# Patient Record
Sex: Male | Born: 1935
Health system: Southern US, Community
[De-identification: ages and names within clinical notes are randomized; demographics above are authoritative.]

## PROBLEM LIST (undated history)

## (undated) DIAGNOSIS — H9190 Unspecified hearing loss, unspecified ear: Secondary | ICD-10-CM

## (undated) DIAGNOSIS — R112 Nausea with vomiting, unspecified: Secondary | ICD-10-CM

## (undated) DIAGNOSIS — N200 Calculus of kidney: Secondary | ICD-10-CM

## (undated) DIAGNOSIS — T8859XA Other complications of anesthesia, initial encounter: Secondary | ICD-10-CM

## (undated) DIAGNOSIS — N184 Chronic kidney disease, stage 4 (severe): Secondary | ICD-10-CM

## (undated) DIAGNOSIS — N186 End stage renal disease: Secondary | ICD-10-CM

## (undated) DIAGNOSIS — T4145XA Adverse effect of unspecified anesthetic, initial encounter: Secondary | ICD-10-CM

## (undated) DIAGNOSIS — Z9889 Other specified postprocedural states: Secondary | ICD-10-CM

## (undated) DIAGNOSIS — I10 Essential (primary) hypertension: Secondary | ICD-10-CM

## (undated) DIAGNOSIS — M1990 Unspecified osteoarthritis, unspecified site: Secondary | ICD-10-CM

## (undated) HISTORY — PX: COLONOSCOPY: SHX174

## (undated) HISTORY — PX: EYE SURGERY: SHX253

## (undated) HISTORY — PX: SHOULDER SURGERY: SHX246

## (undated) HISTORY — PX: BACK SURGERY: SHX140

## (undated) HISTORY — PX: ESOPHAGOGASTRODUODENOSCOPY: SHX1529

## (undated) SURGERY — Surgical Case
Anesthesia: *Unknown

---

## 1998-08-03 ENCOUNTER — Ambulatory Visit (HOSPITAL_COMMUNITY): Admission: EM | Admit: 1998-08-03 | Discharge: 1998-08-03 | Payer: Self-pay | Admitting: Emergency Medicine

## 2003-05-04 ENCOUNTER — Encounter: Payer: Self-pay | Admitting: Gastroenterology

## 2003-05-04 ENCOUNTER — Ambulatory Visit (HOSPITAL_COMMUNITY): Admission: RE | Admit: 2003-05-04 | Discharge: 2003-05-04 | Payer: Self-pay | Admitting: Gastroenterology

## 2004-06-25 ENCOUNTER — Observation Stay (HOSPITAL_COMMUNITY): Admission: EM | Admit: 2004-06-25 | Discharge: 2004-06-26 | Payer: Self-pay | Admitting: Emergency Medicine

## 2004-06-26 ENCOUNTER — Encounter (INDEPENDENT_AMBULATORY_CARE_PROVIDER_SITE_OTHER): Payer: Self-pay | Admitting: Specialist

## 2009-08-19 ENCOUNTER — Ambulatory Visit: Payer: Self-pay | Admitting: Gastroenterology

## 2009-08-30 ENCOUNTER — Encounter: Payer: Self-pay | Admitting: Gastroenterology

## 2009-08-30 ENCOUNTER — Ambulatory Visit: Payer: Self-pay | Admitting: Gastroenterology

## 2009-09-01 ENCOUNTER — Encounter: Payer: Self-pay | Admitting: Gastroenterology

## 2010-05-08 ENCOUNTER — Encounter: Admission: RE | Admit: 2010-05-08 | Discharge: 2010-05-08 | Payer: Self-pay | Admitting: Orthopedic Surgery

## 2010-05-17 ENCOUNTER — Inpatient Hospital Stay (HOSPITAL_COMMUNITY): Admission: RE | Admit: 2010-05-17 | Discharge: 2010-05-19 | Payer: Self-pay | Admitting: Orthopedic Surgery

## 2011-01-28 LAB — COMPREHENSIVE METABOLIC PANEL
ALT: 27 U/L (ref 0–53)
AST: 22 U/L (ref 0–37)
Albumin: 3.7 g/dL (ref 3.5–5.2)
Alkaline Phosphatase: 99 U/L (ref 39–117)
CO2: 28 mEq/L (ref 19–32)
Calcium: 9.2 mg/dL (ref 8.4–10.5)
Chloride: 101 mEq/L (ref 96–112)
Creatinine, Ser: 1.63 mg/dL — ABNORMAL HIGH (ref 0.4–1.5)
GFR calc non Af Amer: 41 mL/min — ABNORMAL LOW (ref >60)
Glucose, Bld: 98 mg/dL (ref 70–99)
Sodium: 135 mEq/L (ref 135–145)
Total Protein: 6.5 g/dL (ref 6.0–8.3)

## 2011-01-28 LAB — CBC
MCH: 29.6 pg (ref 26.0–34.0)
MCHC: 33.9 g/dL (ref 30.0–36.0)
RBC: 5.58 MIL/uL (ref 4.22–5.81)
WBC: 11.3 10*3/uL — ABNORMAL HIGH (ref 4.0–10.5)

## 2011-01-28 LAB — URINALYSIS, DIPSTICK ONLY
Hgb urine dipstick: NEGATIVE
Leukocytes, UA: NEGATIVE
Nitrite: NEGATIVE
Protein, ur: 100 mg/dL — AB
Urobilinogen, UA: 0.2 mg/dL (ref 0.0–1.0)

## 2011-01-28 LAB — DIFFERENTIAL
Basophils Relative: 0 % (ref 0–1)
Eosinophils Absolute: 0.1 10*3/uL (ref 0.0–0.7)
Monocytes Absolute: 0.9 10*3/uL (ref 0.1–1.0)
Neutro Abs: 9.1 10*3/uL — ABNORMAL HIGH (ref 1.7–7.7)

## 2011-01-28 LAB — PROTIME-INR: Prothrombin Time: 13 seconds (ref 11.6–15.2)

## 2011-03-30 NOTE — Consult Note (Signed)
Murillo, Shaun                           ACCOUNT NO.:  [id number]   MEDICAL RECORD NO.:  [id number]                   PATIENT TYPE:  OBV   LOCATION:  0106                                 FACILITY:  [facility]   PHYSICIAN:  Satira Sark, M.D. [facility]        DATE OF BIRTH:  06/28/1932   DATE OF CONSULTATION:  DATE OF DISCHARGE:                                   CONSULTATION   REFERRING PHYSICIAN:  Benito Mccreedy, M.D.   PRIMARY CARE PHYSICIAN:  Lorelee Market, M.D.   CARDIOLOGIST:  Kirk Ruths, M.D.   REASON FOR CONSULTATION:  Chest discomfort.   HISTORY OF PRESENT ILLNESS:  Shaun Murillo is a pleasant 77 year old male with a  history of hypertension and gastrointestinal reflux disease with esophageal  stricture, status post dilation, most recently at the beginning of this  year.  He was evaluated by Dr. Stanford Breed in our office back in April with  some complaints of dyspnea on exertion and at that time was referred for an  adenosine Cardiolite which was read as a probable normal study with no  electrocardiographic changes and some apical thinning, but no clear  ischemia, an ejection fraction calculated at 48%, although visually  appearing to be better.  He presents to the [facility] Emergency Department  now complaining of bad indigestion and describing a burning sensation in  his mid sternal area.  He says that over the last 10 days he has had  dysphagia and actually some emesis after eating due the sensation of early  satiety.  He has gradually developed a soreness in the mid chest area that  is exacerbated on swallowing both liquids and solids.  This became more  intense Thursday morning and has progressed since that time, prompting his  evaluation in the emergency department.  At present, he is symptom-free.  His electrocardiogram today shows sinus rhythm with no acute ST-T wave  changes and his initial point of care cardiac markers are negative.  He is  being admitted  for further observation and we have been asked to evaluate  him further.   ALLERGIES:  CODEINE.   MEDICATIONS:  1. Micardis 80 mg p.o. daily.  2. Indocin p.r.n.  3. Allopurinol p.r.n.   PAST MEDICAL HISTORY:  1. Hypertension.  2. Gouty arthritis.  3. Gastrointestinal reflux disease with esophageal stricture, status post     dilatation.  4. History of erectile dysfunction.   No documented history of coronary artery disease, myocardial infarction,  type 2 diabetes mellitus or dyslipidemia.   SOCIAL HISTORY:  The patient lives in [place], [place], with his  wife.  He quit tobacco use 10 years ago and denies any significant alcohol  use.  He is a retired International aid/development worker.   FAMILY HISTORY:  Significant for cardiovascular disease.  The patient's  father died at age 18 with a myocardial infarction.   REVIEW OF SYSTEMS:  As described in the history  of present illness.  He has  headaches occasionally.  He denies any hematemesis, melena and hematochezia.   PHYSICAL EXAMINATION:  VITAL SIGNS:  His temperature is 97.8 degrees, his  heart rate is 83, respirations 20, blood pressure 150/87 and oxygen  saturation 95% on room air.  GENERAL APPEARANCE:  This is a well-nourished male in no acute distress.  NECK:  No elevated jugular venous pressure or carotid bruits.  No  thyromegaly is noted.  LUNGS:  Clear to auscultation without labored breathing at rest.  CARDIAC:  Regular rate and rhythm without loud murmur or S3 gallop.  There  is no pericardial rub noted.  ABDOMEN:  Soft with normoactive bowel sounds.  There is perhaps some mild  epigastric tenderness.  No guarding.  EXTREMITIES:  No pitting edema.  Peripheral pulses are 2+.  SKIN:  No ulcerative changes are noted.  MUSCULOSKELETAL:  No kyphosis is noted.   LABORATORY DATA:  The chest x-ray is reported as showing no active disease.   WBC 5.6, hemoglobin 15.4, hematocrit 46.1, platelets 216.  Sodium 138,  potassium  4.0, BUN 23, creatinine 1.5, glucose 97.  Initial point of care  troponin I levels are negative.   IMPRESSION:  1. Chest pain syndrome, atypical for ischemia and more suggestive of a     gastrointestinal etiology, particularly given the patient's symptoms     associated with dysphagia and his history of reflux disease and     esophageal stricture, status post dilatation.  His initial     electrocardiogram is normal and his point of care cardiac enzymes are     negative so far.  2. Low-risk Cardiolite in April of this year as described.  The report is     placed in the chart for review.  3. Hypertension.  4. Family history of cardiovascular disease.  5. Remote tobacco use history.  6. History of gouty arthritis with p.r.n. nonsteroidal anti-inflammatory     drug use.   RECOMMENDATIONS:  1. Would cycle cardiac markers.  Do not anticipate any additional cardiac     testing at this point unless markers are abnormal.  Would suggest a     formal gastrointestinal evaluation as a first step.  2. If symptoms progress and gastrointestinal evaluation is unremarkable, can     consider additional cardiac testing.  Would otherwise continue risk     factor modification.  Will follow.                                               Satira Sark, M.D. [facility]    SGM/MEDQ  D:  06/25/2004  T:  06/25/2004  Job:  [id number]

## 2014-04-09 ENCOUNTER — Emergency Department (HOSPITAL_COMMUNITY)
Admission: EM | Admit: 2014-04-09 | Discharge: 2014-04-09 | Disposition: A | Discharge status: Home / Self Care | Payer: Medicare Other | Attending: Emergency Medicine | Admitting: Emergency Medicine

## 2014-04-09 ENCOUNTER — Encounter (HOSPITAL_COMMUNITY): Payer: Self-pay | Admitting: Emergency Medicine

## 2014-04-09 ENCOUNTER — Emergency Department (HOSPITAL_COMMUNITY): Payer: Medicare Other

## 2014-04-09 DIAGNOSIS — S61409A Unspecified open wound of unspecified hand, initial encounter: Secondary | ICD-10-CM | POA: Insufficient documentation

## 2014-04-09 DIAGNOSIS — W010XXA Fall on same level from slipping, tripping and stumbling without subsequent striking against object, initial encounter: Secondary | ICD-10-CM | POA: Insufficient documentation

## 2014-04-09 DIAGNOSIS — I10 Essential (primary) hypertension: Secondary | ICD-10-CM | POA: Insufficient documentation

## 2014-04-09 DIAGNOSIS — Y929 Unspecified place or not applicable: Secondary | ICD-10-CM | POA: Insufficient documentation

## 2014-04-09 DIAGNOSIS — Z880 Allergy status to penicillin: Secondary | ICD-10-CM | POA: Insufficient documentation

## 2014-04-09 DIAGNOSIS — S61411A Laceration without foreign body of right hand, initial encounter: Secondary | ICD-10-CM

## 2014-04-09 DIAGNOSIS — Y939 Activity, unspecified: Secondary | ICD-10-CM | POA: Insufficient documentation

## 2014-04-09 HISTORY — DX: Essential (primary) hypertension: I10

## 2014-04-09 MED ORDER — CLINDAMYCIN HCL 150 MG PO CAPS: 150.0000 mg | ORAL_CAPSULE | Freq: Four times a day (QID) | ORAL | Status: DC

## 2014-04-09 NOTE — ED Notes (Addendum)
Presents with hand laceration from a fall after stepping in [employer] tripping. small abrasion to chin and knee. Pt only c/o right hand laceration. CMS intact. Hand is wrapped up by fire department. No blood thinner use.

## 2014-04-09 NOTE — ED Provider Notes (Signed)
CSN: [id number]     Arrival date & time 04/09/14  1558 History  This chart was scribed for non-physician practitioner Etta Quill, NP working with Ezequiel Essex, MD by Anastasia Pall, ED scribe. This patient was seen in room TR09C/TR09C and the patient's care was started at 6:15 PM.   Chief Complaint  Patient presents with  . Extremity Laceration   (Consider location/radiation/quality/duration/timing/severity/associated sxs/prior Treatment) Patient is a 80 y.o. male presenting with skin laceration. The history is provided by the patient. No language interpreter was used.  Laceration Location:  Hand Hand laceration location:  R palm Bleeding: controlled   Time since incident: earlier this evening. Laceration mechanism:  Fall Foreign body present:  No foreign bodies Tetanus status:  Up to date  HPI Comments: EDWARDS MINDEL is a 80 y.o. male who presents to the Emergency Department complaining of a deep laceration across his right palm, onset earlier this evening when he stepped in a hole, tripped, and landed forward on his hands, knees, and chin. Bleeding controlled by bandage done by Fire Department. He reports mild abrasions to his knees and chin, but denies back pain, extremity pain, and any other associated symptoms. Wife states his tetanus is UTD.   PCP Donnie Coffin, MD  Past Medical History  Diagnosis Date  . Hypertension    History reviewed. No pertinent past surgical history. History reviewed. No pertinent family history. History  Substance Use Topics  . Smoking status: Never Smoker   . Smokeless tobacco: Not on file  . Alcohol Use: No    Review of Systems  Musculoskeletal: Negative for arthralgias, back pain, joint swelling and myalgias.  Skin: Positive for wound (laceration across right palm).  Neurological: Negative for weakness and numbness.  All other systems reviewed and are negative.  Allergies  Codeine and Penicillins  Home Medications   Prior to  Admission medications   Not on File   BP 157/85  Pulse 84  Temp(Src) 97.6 F (36.4 C) (Oral)  Resp 18  SpO2 95% Physical Exam  Nursing note and vitals reviewed. Constitutional: He is oriented to person, place, and time. He appears well-developed and well-nourished. No distress.  HENT:  Head: Normocephalic and atraumatic.  Eyes: EOM are normal.  Neck: Neck supple. No tracheal deviation present.  Cardiovascular: Normal rate.   Pulmonary/Chest: Effort normal. No respiratory distress.  Musculoskeletal: Normal range of motion.  10 cm laceration across right palm.   Neurological: He is alert and oriented to person, place, and time.  Skin: Skin is warm and dry.  Psychiatric: He has a normal mood and affect. His behavior is normal.   ED Course  Procedures (including critical care time) DIAGNOSTIC STUDIES: Oxygen Saturation is 95% on room air, adequate by my interpretation.    COORDINATION OF CARE: 6:20 PM-Discussed treatment plan which includes sutures with pt at bedside and pt agreed to plan.   LACERATION REPAIR PROCEDURE NOTE The patient's identification was confirmed and consent was obtained. This procedure was performed by Etta Quill, NP at 7:10 PM. Site: Right palm Sterile procedures observed: Saline Anesthetic used (type and amt): 6cc 2% Xylocaine with Epinephrine Suture type/size: 4.0 Prolene Length:10 cm # of Sutures: 12 Technique: Simple interrupted, loosely tied  Tetanus UTD  LACERATION REPAIR PROCEDURE NOTE The patient's identification was confirmed and consent was obtained. This procedure was performed by Etta Quill, NP at 7:10 PM. Site: Right pointer finger Sterile procedures observed: Saline Anesthetic used (type and amt): 1 cc 2% Xylocaine with Epinephrine Suture  type/size: 4.0 Prolene Length:2.5 cm # of Sutures: 4 Technique:Simple interrupted, loosely tied.  Tetanus UTD  Labs Review Labs Reviewed - No data to display  Imaging Review Dg Hand  Complete Right  04/09/2014   CLINICAL DATA:  Laceration across the palmar surface of the hand.  EXAM: RIGHT HAND - COMPLETE 3+ VIEW  COMPARISON:  None.  FINDINGS: Positioning is limited by overlying bandages. The fingers are partially overlapped. There is some soft tissue swelling in the hand. No definite foreign body or soft tissue emphysema is seen. There is no evidence of acute fracture or dislocation. Mild interphalangeal degenerative changes are noted.  IMPRESSION: No acute osseous findings or foreign bodies demonstrated. Evaluation is limited by overlying bandages and positioning.   Electronically Signed   By: Camie Patience M.D.   On: 04/09/2014 18:01    EKG Interpretation None     Medications - No data to display  Patient discussed with and seen by Dr. Wyvonnia Dusky. MDM   Final diagnoses:  Laceration of right hand   Superficial abrasion to chin and right knee. Hand laceration.  Discussed with Dr. Burney Gauze, who will see patient in the office on Tuesday.  Per his recommendation, wounds cleaned and loosely closed.  I personally performed the services described in this documentation, which was scribed in my presence. The recorded information has been reviewed and is accurate.    Norman Herrlich, NP 04/10/14 [phone]

## 2014-04-09 NOTE — ED Notes (Signed)
NP done suturing.

## 2014-04-09 NOTE — ED Notes (Signed)
NP at bedside to suture

## 2014-04-09 NOTE — Discharge Instructions (Signed)

## 2014-04-10 NOTE — ED Provider Notes (Signed)
Medical screening examination/treatment/procedure(s) were conducted as a shared visit with non-physician practitioner(s) and myself.  I personally evaluated the patient during the encounter.  Patient with laceration to right palm and index finger after mechanical fall. No loss of consciousness. Abrasions to knee and shin. No anticoagulant use.  Large laceration across right palm at level of MCPs. No tendon visible. Patient able to flex second, third, fourth, fifth digits at MCP joint and extend fully. Second laceration across the PIP joint of right index finger. Tendon visible. Flexion intact at MCP, FDS, FDP extension intact. Sensation and capillary refill intact  No evidence of vascular or nerve injury. Cannot completely rule out tendon injury. NP Tamala Julian d/w Dr. Burney Gauze who agrees with loose closure   EKG Interpretation None        Ezequiel Essex, MD 04/10/14 [phone]

## 2014-07-01 ENCOUNTER — Encounter: Payer: Self-pay | Admitting: Gastroenterology

## 2015-04-21 ENCOUNTER — Encounter: Payer: Self-pay | Admitting: Gastroenterology

## 2015-11-28 ENCOUNTER — Other Ambulatory Visit: Payer: Self-pay | Admitting: Family Medicine

## 2015-11-28 ENCOUNTER — Ambulatory Visit
Admission: RE | Admit: 2015-11-28 | Discharge: 2015-11-28 | Disposition: A | Discharge status: Home / Self Care | Payer: Self-pay | Source: Ambulatory Visit | Attending: Family Medicine | Admitting: Family Medicine

## 2015-11-28 DIAGNOSIS — M79645 Pain in left finger(s): Secondary | ICD-10-CM

## 2016-06-01 ENCOUNTER — Other Ambulatory Visit: Payer: Self-pay | Admitting: Otolaryngology

## 2016-06-01 DIAGNOSIS — R221 Localized swelling, mass and lump, neck: Secondary | ICD-10-CM

## 2016-06-08 ENCOUNTER — Ambulatory Visit
Admission: RE | Admit: 2016-06-08 | Discharge: 2016-06-08 | Disposition: A | Discharge status: Home / Self Care | Payer: Medicare Other | Source: Ambulatory Visit | Attending: Otolaryngology | Admitting: Otolaryngology

## 2016-06-08 DIAGNOSIS — R221 Localized swelling, mass and lump, neck: Secondary | ICD-10-CM

## 2016-06-28 ENCOUNTER — Other Ambulatory Visit: Payer: Self-pay | Admitting: Otolaryngology

## 2016-07-04 ENCOUNTER — Other Ambulatory Visit: Payer: Self-pay | Admitting: Otolaryngology

## 2016-07-09 NOTE — Pre-Procedure Instructions (Signed)
Shaun Murillo  07/09/2016      [facility] - [place], [place] - [id number] [place] [address] Phone: [phone] Fax: [phone]    Your procedure is scheduled on September 6  Report to [facility] Admitting at 0800 A.M.  Call this number if you have problems the morning of surgery:  [phone]   Remember:  Do not eat food or drink liquids after midnight.   Take these medicines the morning of surgery with A SIP OF WATER Amlodipine (norvasc), omeprazole (prilosec)  7 days prior to surgery STOP taking any Aspirin, Aleve, Naproxen, Ibuprofen, Motrin, Advil, Goody's, BC's, all herbal medications, fish oil, and all vitamins    Do not wear jewelry.  Do not wear lotions, powders, or cologne, or deoderant.  Men may shave face and neck.  Do not bring valuables to the hospital.  [facility] is not responsible for any belongings or valuables.  Contacts, dentures or bridgework may not be worn into surgery.  Leave your suitcase in the car.  After surgery it may be brought to your room.  For patients admitted to the hospital, discharge time will be determined by your treatment team.  Patients discharged the day of surgery will not be allowed to drive home.    Special instructions:   [place]- Preparing For Surgery  Before surgery, you can play an important role. Because skin is not sterile, your skin needs to be as free of germs as possible. You can reduce the number of germs on your skin by washing with CHG (chlorahexidine gluconate) Soap before surgery.  CHG is an antiseptic cleaner which kills germs and bonds with the skin to continue killing germs even after washing.  Please do not use if you have an allergy to CHG or antibacterial soaps. If your skin becomes reddened/irritated stop using the CHG.  Do not shave (including legs and underarms) for at least 48 hours prior to first CHG shower. It is OK to shave your  face.  Please follow these instructions carefully.   1. Shower the NIGHT BEFORE SURGERY and the MORNING OF SURGERY with CHG.   2. If you chose to wash your hair, wash your hair first as usual with your normal shampoo.  3. After you shampoo, rinse your hair and body thoroughly to remove the shampoo.  4. Use CHG as you would any other liquid soap. You can apply CHG directly to the skin and wash gently with a scrungie or a clean washcloth.   5. Apply the CHG Soap to your body ONLY FROM THE NECK DOWN.  Do not use on open wounds or open sores. Avoid contact with your eyes, ears, mouth and genitals (private parts). Wash genitals (private parts) with your normal soap.  6. Wash thoroughly, paying special attention to the area where your surgery will be performed.  7. Thoroughly rinse your body with warm water from the neck down.  8. DO NOT shower/wash with your normal soap after using and rinsing off the CHG Soap.  9. Pat yourself dry with a CLEAN TOWEL.   10. Wear CLEAN PAJAMAS   11. Place CLEAN SHEETS on your bed the night of your first shower and DO NOT SLEEP WITH PETS.    Day of Surgery: Do not apply any deodorants/lotions. Please wear clean clothes to the hospital/surgery center.      Please read over the following fact sheets that you  were given. Pain Booklet, Coughing and Deep Breathing and Surgical Site Infection Prevention

## 2016-07-10 ENCOUNTER — Encounter (HOSPITAL_COMMUNITY)
Admission: RE | Admit: 2016-07-10 | Discharge: 2016-07-10 | Disposition: A | Discharge status: Home / Self Care | Payer: Medicare Other | Source: Ambulatory Visit | Attending: Otolaryngology | Admitting: Otolaryngology

## 2016-07-10 ENCOUNTER — Encounter (HOSPITAL_COMMUNITY): Payer: Self-pay

## 2016-07-10 DIAGNOSIS — Z01812 Encounter for preprocedural laboratory examination: Secondary | ICD-10-CM | POA: Diagnosis not present

## 2016-07-10 DIAGNOSIS — I10 Essential (primary) hypertension: Secondary | ICD-10-CM | POA: Diagnosis not present

## 2016-07-10 HISTORY — DX: Adverse effect of unspecified anesthetic, initial encounter: T41.45XA

## 2016-07-10 HISTORY — DX: Other complications of anesthesia, initial encounter: T88.59XA

## 2016-07-10 HISTORY — DX: Chronic kidney disease, stage 4 (severe): N18.4

## 2016-07-10 HISTORY — DX: Nausea with vomiting, unspecified: Z98.890

## 2016-07-10 HISTORY — DX: Unspecified hearing loss, unspecified ear: H91.90

## 2016-07-10 HISTORY — DX: Unspecified osteoarthritis, unspecified site: M19.90

## 2016-07-10 HISTORY — DX: Nausea with vomiting, unspecified: R11.2

## 2016-07-10 HISTORY — DX: Calculus of kidney: N20.0

## 2016-07-10 LAB — BASIC METABOLIC PANEL
Anion gap: 9 (ref 5–15)
BUN: 24 mg/dL — AB (ref 6–20)
CALCIUM: 9.5 mg/dL (ref 8.9–10.3)
CO2: 22 mmol/L (ref 22–32)
CREATININE: 2.42 mg/dL — AB (ref 0.61–1.24)
Chloride: 110 mmol/L (ref 101–111)
GFR calc Af Amer: 27 mL/min — ABNORMAL LOW (ref >60)
GFR, EST NON AFRICAN AMERICAN: 23 mL/min — AB (ref >60)
GLUCOSE: 98 mg/dL (ref 65–99)
Potassium: 4.2 mmol/L (ref 3.5–5.1)
SODIUM: 141 mmol/L (ref 135–145)

## 2016-07-10 LAB — CBC
HEMATOCRIT: 47.2 % (ref 39.0–52.0)
Hemoglobin: 15.4 g/dL (ref 13.0–17.0)
MCH: 29.4 pg (ref 26.0–34.0)
MCHC: 32.6 g/dL (ref 30.0–36.0)
MCV: 90.2 fL (ref 78.0–100.0)
PLATELETS: 210 10*3/uL (ref 150–400)
RBC: 5.23 MIL/uL (ref 4.22–5.81)
RDW: 14.2 % (ref 11.5–15.5)
WBC: 5 10*3/uL (ref 4.0–10.5)

## 2016-07-10 NOTE — Progress Notes (Signed)
PCP - L. Donnie Coffin  Cardiologist - denies  Chest x-ray - not needed EKG - 07/10/16 Stress Test - denies ECHO -denies  Cardiac Cath -denies     Patient denies shortness of breath, fever, cough and chest pain at PAT appointment

## 2016-07-11 NOTE — Progress Notes (Addendum)
Anesthesia Chart Review:  Pt is an 82 year old male scheduled for R parotidectomy with selective neck dissection, radical neck dissection on 07/18/2016 with Melida Quitter, MD.   PCP is Donnie Coffin, MD.   PMH includes:  HTN, CKD (stage 4), post-op N/V. Never smoker. BMI 26.5  Medications include: amlodipine, ASA, losartan, prilosec.   Preoperative labs reviewed.  Cr 2.42, BUN 24. This is consistent with results from PCP's office; pt's CKD is currently managed by PCP.   EKG 07/10/16: NSR.   If no changes, I anticipate pt can proceed with surgery as scheduled.   Willeen Cass, FNP-BC [facility]/Anesthesiology Phone: [phone] 07/12/2016 1:14 PM

## 2016-07-12 ENCOUNTER — Encounter (HOSPITAL_COMMUNITY): Payer: Self-pay

## 2016-07-17 NOTE — Anesthesia Preprocedure Evaluation (Addendum)
Anesthesia Evaluation  Patient identified by MRN, date of birth, ID band Patient awake    Reviewed: Allergy & Precautions, NPO status , Patient's Chart, lab work & pertinent test results  History of Anesthesia Complications (+) PONV and history of anesthetic complications  Airway Mallampati: II  TM Distance: >3 FB Neck ROM: Full    Dental  (+) Upper Dentures, Lower Dentures   Pulmonary neg shortness of breath, neg sleep apnea, neg COPD, neg recent URI,    Pulmonary exam normal breath sounds clear to auscultation       Cardiovascular Exercise Tolerance: Good hypertension, Pt. on medications (-) angina(-) Past MI, (-) Cardiac Stents and (-) Orthopnea  Rhythm:Regular Rate:Normal     Neuro/Psych neg Seizures negative neurological ROS     GI/Hepatic negative GI ROS, Neg liver ROS, neg GERD  ,  Endo/Other  negative endocrine ROSneg diabetes  Renal/GU CRFRenal disease (baseline Cr ~2.4)     Musculoskeletal  (+) Arthritis ,   Abdominal   Peds  Hematology negative hematology ROS (+)   Anesthesia Other Findings   Reproductive/Obstetrics                            Anesthesia Physical Anesthesia Plan  ASA: III  Anesthesia Plan: General   Post-op Pain Management:    Induction: Intravenous  Airway Management Planned: Oral ETT  Additional Equipment:   Intra-op Plan:   Post-operative Plan: Extubation in OR  Informed Consent: I have reviewed the patients History and Physical, chart, labs and discussed the procedure including the risks, benefits and alternatives for the proposed anesthesia with the patient or authorized representative who has indicated his/her understanding and acceptance.   Dental advisory given  Plan Discussed with: CRNA  Anesthesia Plan Comments: (Risks of general anesthesia discussed including, but not limited to, sore throat, hoarse voice, chipped/damaged teeth,  injury to vocal cords, nausea and vomiting, allergic reactions, lung infection, heart attack, stroke, and death. All questions answered. )       Anesthesia Quick Evaluation

## 2016-07-18 ENCOUNTER — Ambulatory Visit (HOSPITAL_COMMUNITY): Payer: Medicare Other | Admitting: Anesthesiology

## 2016-07-18 ENCOUNTER — Encounter (HOSPITAL_COMMUNITY)
Admission: RE | Disposition: A | Discharge status: Home / Self Care | Payer: Self-pay | Source: Ambulatory Visit | Attending: Otolaryngology

## 2016-07-18 ENCOUNTER — Ambulatory Visit (HOSPITAL_COMMUNITY): Payer: Medicare Other | Admitting: Emergency Medicine

## 2016-07-18 ENCOUNTER — Encounter (HOSPITAL_COMMUNITY): Payer: Self-pay | Admitting: Surgery

## 2016-07-18 ENCOUNTER — Observation Stay (HOSPITAL_COMMUNITY)
Admission: RE | Admit: 2016-07-18 | Discharge: 2016-07-19 | Disposition: A | Discharge status: Home / Self Care | Payer: Medicare Other | Source: Ambulatory Visit | Attending: Otolaryngology | Admitting: Otolaryngology

## 2016-07-18 DIAGNOSIS — Z880 Allergy status to penicillin: Secondary | ICD-10-CM | POA: Diagnosis not present

## 2016-07-18 DIAGNOSIS — D110 Benign neoplasm of parotid gland: Secondary | ICD-10-CM | POA: Diagnosis present

## 2016-07-18 DIAGNOSIS — N184 Chronic kidney disease, stage 4 (severe): Secondary | ICD-10-CM | POA: Diagnosis not present

## 2016-07-18 DIAGNOSIS — K219 Gastro-esophageal reflux disease without esophagitis: Secondary | ICD-10-CM | POA: Insufficient documentation

## 2016-07-18 DIAGNOSIS — K118 Other diseases of salivary glands: Secondary | ICD-10-CM | POA: Diagnosis present

## 2016-07-18 DIAGNOSIS — M1990 Unspecified osteoarthritis, unspecified site: Secondary | ICD-10-CM | POA: Diagnosis not present

## 2016-07-18 DIAGNOSIS — I129 Hypertensive chronic kidney disease with stage 1 through stage 4 chronic kidney disease, or unspecified chronic kidney disease: Secondary | ICD-10-CM | POA: Diagnosis not present

## 2016-07-18 DIAGNOSIS — Z7982 Long term (current) use of aspirin: Secondary | ICD-10-CM | POA: Insufficient documentation

## 2016-07-18 HISTORY — PX: PAROTIDECTOMY: SHX2163

## 2016-07-18 SURGERY — EXCISION, PAROTID GLAND
Anesthesia: General | Site: Neck | Laterality: Right

## 2016-07-18 MED ORDER — LIDOCAINE 2% (20 MG/ML) 5 ML SYRINGE
INTRAMUSCULAR | Status: AC
  Filled 2016-07-18: qty 10

## 2016-07-18 MED ORDER — FENTANYL CITRATE (PF) 100 MCG/2ML IJ SOLN
25.0000 ug | INTRAMUSCULAR | Status: DC | PRN
  Administered 2016-07-18: 25 ug via INTRAVENOUS
  Administered 2016-07-18 (×2): 50 ug via INTRAVENOUS

## 2016-07-18 MED ORDER — SUCCINYLCHOLINE 20MG/ML (10ML) SYRINGE FOR MEDFUSION PUMP - OPTIME
INTRAMUSCULAR | Status: DC | PRN
  Administered 2016-07-18: 80 mg via INTRAVENOUS

## 2016-07-18 MED ORDER — FENTANYL CITRATE (PF) 100 MCG/2ML IJ SOLN
INTRAMUSCULAR | Status: DC | PRN
  Administered 2016-07-18 (×2): 50 ug via INTRAVENOUS

## 2016-07-18 MED ORDER — BACITRACIN ZINC 500 UNIT/GM EX OINT
1.0000 "application " | TOPICAL_OINTMENT | Freq: Three times a day (TID) | CUTANEOUS | Status: DC
  Administered 2016-07-18 – 2016-07-19 (×3): 1 via TOPICAL

## 2016-07-18 MED ORDER — HYDROCODONE-ACETAMINOPHEN 5-325 MG PO TABS
1.0000 | ORAL_TABLET | ORAL | Status: DC | PRN
  Administered 2016-07-18 – 2016-07-19 (×3): 2 via ORAL
  Filled 2016-07-18 (×3): qty 2

## 2016-07-18 MED ORDER — PROMETHAZINE HCL 25 MG/ML IJ SOLN
INTRAMUSCULAR | Status: AC
  Administered 2016-07-18: 6.25 mg via INTRAVENOUS
  Filled 2016-07-18: qty 1

## 2016-07-18 MED ORDER — ONDANSETRON HCL 4 MG/2ML IJ SOLN
INTRAMUSCULAR | Status: AC
  Filled 2016-07-18: qty 4

## 2016-07-18 MED ORDER — SUCCINYLCHOLINE CHLORIDE 200 MG/10ML IV SOSY
PREFILLED_SYRINGE | INTRAVENOUS | Status: AC
  Filled 2016-07-18: qty 10

## 2016-07-18 MED ORDER — CHLORHEXIDINE GLUCONATE CLOTH 2 % EX PADS: 6.0000 | MEDICATED_PAD | Freq: Once | CUTANEOUS | Status: DC

## 2016-07-18 MED ORDER — ONDANSETRON HCL 4 MG/2ML IJ SOLN
4.0000 mg | Freq: Once | INTRAMUSCULAR | Status: AC | PRN
  Administered 2016-07-18: 4 mg via INTRAVENOUS

## 2016-07-18 MED ORDER — EPHEDRINE SULFATE 50 MG/ML IJ SOLN
INTRAMUSCULAR | Status: DC | PRN
  Administered 2016-07-18: 10 mg via INTRAVENOUS
  Administered 2016-07-18 (×2): 5 mg via INTRAVENOUS

## 2016-07-18 MED ORDER — CEFAZOLIN IN D5W 1 GM/50ML IV SOLN
INTRAVENOUS | Status: DC | PRN
  Administered 2016-07-18: 2 g via INTRAVENOUS

## 2016-07-18 MED ORDER — SODIUM CHLORIDE 0.9 % IV SOLN
0.0500 ug/kg/min | INTRAVENOUS | Status: DC
  Filled 2016-07-18: qty 5000

## 2016-07-18 MED ORDER — CEFAZOLIN IN D5W 1 GM/50ML IV SOLN
1.0000 g | Freq: Three times a day (TID) | INTRAVENOUS | Status: DC
  Administered 2016-07-18 – 2016-07-19 (×2): 1 g via INTRAVENOUS
  Filled 2016-07-18 (×4): qty 50

## 2016-07-18 MED ORDER — PANTOPRAZOLE SODIUM 40 MG PO TBEC
80.0000 mg | DELAYED_RELEASE_TABLET | Freq: Every day | ORAL | Status: DC
  Administered 2016-07-19: 80 mg via ORAL
  Filled 2016-07-18: qty 2

## 2016-07-18 MED ORDER — PROPOFOL 10 MG/ML IV BOLUS
INTRAVENOUS | Status: AC
  Filled 2016-07-18: qty 20

## 2016-07-18 MED ORDER — FENTANYL CITRATE (PF) 100 MCG/2ML IJ SOLN
INTRAMUSCULAR | Status: AC
  Administered 2016-07-18: 25 ug via INTRAVENOUS
  Filled 2016-07-18: qty 2

## 2016-07-18 MED ORDER — PROMETHAZINE HCL 25 MG/ML IJ SOLN
6.2500 mg | Freq: Once | INTRAMUSCULAR | Status: AC
  Administered 2016-07-18: 6.25 mg via INTRAVENOUS

## 2016-07-18 MED ORDER — 0.9 % SODIUM CHLORIDE (POUR BTL) OPTIME
TOPICAL | Status: DC | PRN
  Administered 2016-07-18: 1000 mL

## 2016-07-18 MED ORDER — LIDOCAINE-EPINEPHRINE 1 %-1:100000 IJ SOLN
INTRAMUSCULAR | Status: AC
  Filled 2016-07-18: qty 1

## 2016-07-18 MED ORDER — PROPOFOL 10 MG/ML IV BOLUS
INTRAVENOUS | Status: DC | PRN
  Administered 2016-07-18: 90 mg via INTRAVENOUS

## 2016-07-18 MED ORDER — KCL IN DEXTROSE-NACL 20-5-0.45 MEQ/L-%-% IV SOLN
INTRAVENOUS | Status: DC
  Administered 2016-07-18 – 2016-07-19 (×2): via INTRAVENOUS
  Filled 2016-07-18 (×2): qty 1000

## 2016-07-18 MED ORDER — EPHEDRINE 5 MG/ML INJ
INTRAVENOUS | Status: AC
  Filled 2016-07-18: qty 10

## 2016-07-18 MED ORDER — BACITRACIN ZINC 500 UNIT/GM EX OINT
TOPICAL_OINTMENT | CUTANEOUS | Status: DC | PRN
  Administered 2016-07-18: 1 via TOPICAL

## 2016-07-18 MED ORDER — ONDANSETRON HCL 4 MG/2ML IJ SOLN
INTRAMUSCULAR | Status: AC
  Administered 2016-07-18: 4 mg via INTRAVENOUS
  Filled 2016-07-18: qty 2

## 2016-07-18 MED ORDER — AMLODIPINE BESYLATE 5 MG PO TABS
5.0000 mg | ORAL_TABLET | Freq: Every day | ORAL | Status: DC
  Administered 2016-07-19: 5 mg via ORAL
  Filled 2016-07-18 (×2): qty 1

## 2016-07-18 MED ORDER — SODIUM CHLORIDE 0.9 % IV SOLN
INTRAVENOUS | Status: DC | PRN
  Administered 2016-07-18: .1 ug/kg/min via INTRAVENOUS

## 2016-07-18 MED ORDER — LIDOCAINE-EPINEPHRINE 1 %-1:100000 IJ SOLN
INTRAMUSCULAR | Status: DC | PRN
  Administered 2016-07-18: 5 mL via INTRADERMAL

## 2016-07-18 MED ORDER — BACITRACIN ZINC 500 UNIT/GM EX OINT
TOPICAL_OINTMENT | CUTANEOUS | Status: AC
  Filled 2016-07-18: qty 56.7

## 2016-07-18 MED ORDER — FENTANYL CITRATE (PF) 100 MCG/2ML IJ SOLN
INTRAMUSCULAR | Status: AC
  Administered 2016-07-18: 50 ug via INTRAVENOUS
  Filled 2016-07-18: qty 2

## 2016-07-18 MED ORDER — LOSARTAN POTASSIUM 50 MG PO TABS
100.0000 mg | ORAL_TABLET | Freq: Every day | ORAL | Status: DC
Start: 2016-07-18 — End: 2016-07-19
  Administered 2016-07-19: 100 mg via ORAL
  Filled 2016-07-18: qty 2

## 2016-07-18 MED ORDER — PHENYLEPHRINE HCL 10 MG/ML IJ SOLN
INTRAMUSCULAR | Status: DC | PRN
  Administered 2016-07-18: 25 ug/min via INTRAVENOUS

## 2016-07-18 MED ORDER — FENTANYL CITRATE (PF) 100 MCG/2ML IJ SOLN
INTRAMUSCULAR | Status: AC
  Filled 2016-07-18: qty 4

## 2016-07-18 MED ORDER — LACTATED RINGERS IV SOLN
INTRAVENOUS | Status: DC | PRN
  Administered 2016-07-18: 09:00:00 via INTRAVENOUS

## 2016-07-18 MED ORDER — SODIUM CHLORIDE 0.9 % IV SOLN
INTRAVENOUS | Status: DC | PRN
  Administered 2016-07-18: 12:00:00 via INTRAVENOUS

## 2016-07-18 MED ORDER — MORPHINE SULFATE (PF) 2 MG/ML IV SOLN
2.0000 mg | INTRAVENOUS | Status: DC | PRN
  Administered 2016-07-18: 2 mg via INTRAVENOUS
  Filled 2016-07-18: qty 1

## 2016-07-18 MED ORDER — LIDOCAINE HCL (CARDIAC) 20 MG/ML IV SOLN
INTRAVENOUS | Status: DC | PRN
  Administered 2016-07-18: 100 mg via INTRAVENOUS

## 2016-07-18 MED ORDER — SODIUM CHLORIDE 0.9 % IV SOLN
INTRAVENOUS | Status: DC
  Administered 2016-07-18: 08:00:00 via INTRAVENOUS

## 2016-07-18 MED ORDER — CEFAZOLIN SODIUM 1 G IJ SOLR
INTRAMUSCULAR | Status: AC
  Filled 2016-07-18: qty 20

## 2016-07-18 SURGICAL SUPPLY — 56 items
ATTRACTOMAT 16X20 MAGNETIC DRP (DRAPES) ×4 IMPLANT
BLADE SURG 15 STRL LF DISP TIS (BLADE) ×1 IMPLANT
BLADE SURG 15 STRL SS (BLADE) ×4
CANISTER SUCTION 2500CC (MISCELLANEOUS) ×4 IMPLANT
CLEANER TIP ELECTROSURG 2X2 (MISCELLANEOUS) ×4 IMPLANT
CONT SPEC 4OZ CLIKSEAL STRL BL (MISCELLANEOUS) ×5 IMPLANT
CORDS BIPOLAR (ELECTRODE) ×4 IMPLANT
COVER SURGICAL LIGHT HANDLE (MISCELLANEOUS) ×4 IMPLANT
CRADLE DONUT ADULT HEAD (MISCELLANEOUS) ×3 IMPLANT
DRAIN JACKSON RD 7FR 3/32 (WOUND CARE) ×3 IMPLANT
DRAPE INCISE 13X13 STRL (DRAPES) ×4 IMPLANT
ELECT COATED BLADE 2.86 ST (ELECTRODE) ×7 IMPLANT
ELECT PAIRED SUBDERMAL (MISCELLANEOUS) ×4
ELECT REM PT RETURN 9FT ADLT (ELECTROSURGICAL) ×4
ELECTRODE PAIRED SUBDERMAL (MISCELLANEOUS) ×2 IMPLANT
ELECTRODE REM PT RTRN 9FT ADLT (ELECTROSURGICAL) ×2 IMPLANT
EVACUATOR SILICONE 100CC (DRAIN) ×4 IMPLANT
FORCEPS TISS BAYO ENTCEPS (INSTRUMENTS) ×3 IMPLANT
GAUZE SPONGE 4X4 16PLY XRAY LF (GAUZE/BANDAGES/DRESSINGS) ×4 IMPLANT
GLOVE BIO SURGEON STRL SZ 6.5 (GLOVE) ×2 IMPLANT
GLOVE BIO SURGEON STRL SZ7.5 (GLOVE) ×4 IMPLANT
GLOVE BIO SURGEONS STRL SZ 6.5 (GLOVE) ×1
GOWN STRL REUS W/ TWL LRG LVL3 (GOWN DISPOSABLE) ×4 IMPLANT
GOWN STRL REUS W/TWL LRG LVL3 (GOWN DISPOSABLE) ×8
KIT BASIN OR (CUSTOM PROCEDURE TRAY) ×4 IMPLANT
KIT ROOM TURNOVER OR (KITS) ×4 IMPLANT
LOCATOR NERVE 3 VOLT (DISPOSABLE) ×3 IMPLANT
NDL HYPO 25GX1X1/2 BEV (NEEDLE) ×1 IMPLANT
NEEDLE HYPO 25GX1X1/2 BEV (NEEDLE) ×4 IMPLANT
NS IRRIG 1000ML POUR BTL (IV SOLUTION) ×8 IMPLANT
PAD ARMBOARD 7.5X6 YLW CONV (MISCELLANEOUS) ×8 IMPLANT
PENCIL BUTTON HOLSTER BLD 10FT (ELECTRODE) ×7 IMPLANT
PROBE NERVBE PRASS .33 (MISCELLANEOUS) ×4 IMPLANT
SOL PREP POV-IOD 4OZ 10% (MISCELLANEOUS) ×3 IMPLANT
SPECIMEN JAR MEDIUM (MISCELLANEOUS) ×4 IMPLANT
SPONGE LAP 18X18 X RAY DECT (DISPOSABLE) ×4 IMPLANT
STAPLER VISISTAT 35W (STAPLE) ×4 IMPLANT
SUT ETHILON 2 0 FS 18 (SUTURE) ×4 IMPLANT
SUT ETHILON 3 0 PS 1 (SUTURE) ×4 IMPLANT
SUT ETHILON 5 0 P 3 18 (SUTURE) ×2
SUT NYLON ETHILON 5-0 P-3 1X18 (SUTURE) ×2 IMPLANT
SUT SILK 2 0 FS (SUTURE) ×4 IMPLANT
SUT SILK 2 0 SH CR/8 (SUTURE) ×4 IMPLANT
SUT SILK 3 0 REEL (SUTURE) ×12 IMPLANT
SUT SILK 3 0 SH CR/8 (SUTURE) ×4 IMPLANT
SUT SILK 4 0 REEL (SUTURE) ×4 IMPLANT
SUT VIC AB 3-0 SH 27 (SUTURE) ×16
SUT VIC AB 3-0 SH 27X BRD (SUTURE) ×8 IMPLANT
SUT VICRYL 4-0 PS2 18IN ABS (SUTURE) ×8 IMPLANT
SWAB COLLECTION DEVICE MRSA (MISCELLANEOUS) ×3 IMPLANT
SWAB CULTURE ESWAB REG 1ML (MISCELLANEOUS) ×3 IMPLANT
TOWEL OR 17X24 6PK STRL BLUE (TOWEL DISPOSABLE) ×4 IMPLANT
TRAY ENT MC OR (CUSTOM PROCEDURE TRAY) ×4 IMPLANT
TRAY FOLEY CATH 14FRSI W/METER (CATHETERS) ×3 IMPLANT
UNDERPAD 30X30 (UNDERPADS AND DIAPERS) ×4 IMPLANT
WATER STERILE IRR 1000ML POUR (IV SOLUTION) ×4 IMPLANT

## 2016-07-18 NOTE — Anesthesia Procedure Notes (Signed)
Procedure Name: Intubation Date/Time: 07/18/2016 10:25 AM Performed by: Neldon Newport Pre-anesthesia Checklist: Timeout performed, Patient being monitored, Suction available, Patient identified and Emergency Drugs available Patient Re-evaluated:Patient Re-evaluated prior to inductionOxygen Delivery Method: Circle system utilized Preoxygenation: Pre-oxygenation with 100% oxygen Intubation Type: IV induction and Rapid sequence Ventilation: Mask ventilation without difficulty Laryngoscope Size: Mac Grade View: Grade I Tube type: Oral Tube size: 7.5 mm Number of attempts: 1 Placement Confirmation: breath sounds checked- equal and bilateral,  positive ETCO2 and ETT inserted through vocal cords under direct vision Secured at: 22 cm Tube secured with: Tape Dental Injury: Teeth and Oropharynx as per pre-operative assessment

## 2016-07-18 NOTE — Transfer of Care (Signed)
Immediate Anesthesia Transfer of Care Note  Patient: Shaun Murillo  Procedure(s) Performed: Procedure(s) with comments: RIGHT SUPERFICIAL PAROTIDECTOMY WITH NERVE DISSECTION (Right) - RIGHT PAROTIDECTOMY WITH SELECTIVE NECK DISSECTION  Patient Location: PACU  Anesthesia Type:General  Level of Consciousness: awake, alert  and oriented  Airway & Oxygen Therapy: Patient Spontanous Breathing and Patient connected to nasal cannula oxygen  Post-op Assessment: Report given to RN, Post -op Vital signs reviewed and stable and Patient moving all extremities X 4  Post vital signs: Reviewed and stable  Last Vitals:  Vitals:   07/18/16 0821 07/18/16 1230  BP: (!) 156/97   Pulse: 96 (!) 116  Resp: 18 20  Temp: 36.6 C 36.9 C    Last Pain:  Vitals:   07/18/16 0821  TempSrc: Oral         Complications: No apparent anesthesia complications

## 2016-07-18 NOTE — H&P (Signed)
Shaun Murillo is an 82 y.o. male.   Chief Complaint: Right parotid mass HPI: 82 year old male with right parotid mass for the past few months.  It has not changed in size.  FNA was inconclusive.  He presents for surgical management.  Past Medical History:  Diagnosis Date  . Arthritis   . Chronic kidney disease (CKD), stage IV (severe) ([place])   . Complication of anesthesia   . Hard of hearing   . Hypertension   . Kidney stones   . PONV (postoperative nausea and vomiting)     Past Surgical History:  Procedure Laterality Date  . BACK SURGERY    . COLONOSCOPY    . ESOPHAGOGASTRODUODENOSCOPY    . EYE SURGERY     cataracts bilateral  . SHOULDER SURGERY Right     History reviewed. No pertinent family history. Social History:  reports that he has never smoked. He has never used smokeless tobacco. He reports that he does not drink alcohol or use drugs.  Allergies:  Allergies  Allergen Reactions  . Codeine Nausea And Vomiting  . Penicillins Nausea And Vomiting    Has patient had a PCN reaction causing immediate rash, facial/tongue/throat swelling, SOB or lightheadedness with hypotension: no Has patient had a PCN reaction causing severe rash involving mucus membranes or skin necrosis:no Has patient had a PCN reaction that required hospitalization no Has patient had a PCN reaction occurring within the last 10 years: no If all of the above answers are "NO", then may proceed with Cephalosporin use.    Medications Prior to Admission  Medication Sig Dispense Refill  . amLODipine (NORVASC) 5 MG tablet Take 1 tablet by mouth daily.    Marland Kitchen aspirin EC 81 MG tablet Take 81 mg by mouth daily as needed for moderate pain.    Marland Kitchen losartan (COZAAR) 100 MG tablet Take 1 tablet by mouth daily.    Marland Kitchen omeprazole (PRILOSEC) 40 MG capsule Take 1 capsule by mouth daily.      No results found for this or any previous visit (from the past 48 hour(s)). No results found.  Review of Systems  All other  systems reviewed and are negative.   Blood pressure (!) 156/97, pulse 96, temperature 97.9 F (36.6 C), temperature source Oral, resp. rate 18, SpO2 97 %. Physical Exam  Constitutional: He is oriented to person, place, and time. He appears well-developed and well-nourished. No distress.  HENT:  Head: Normocephalic and atraumatic.  Right Ear: External ear normal.  Left Ear: External ear normal.  Nose: Nose normal.  Mouth/Throat: Oropharynx is clear and moist.  Eyes: Conjunctivae and EOM are normal. Pupils are equal, round, and reactive to light.  Neck: Normal range of motion. Neck supple.  Right lower parotid/zone 2 mass, about 2.5 cm.  Cardiovascular: Normal rate.   Respiratory: Effort normal.  Musculoskeletal: Normal range of motion.  Neurological: He is alert and oriented to person, place, and time. No cranial nerve deficit.  Skin: Skin is warm and dry.  Psychiatric: He has a normal mood and affect. His behavior is normal. Judgment and thought content normal.     Assessment/Plan Right parotid mass To OR for right parotidectomy, possible selective neck dissection.  Overnight observation for drain management.  Melida Quitter, MD 07/18/2016, 9:15 AM

## 2016-07-18 NOTE — Anesthesia Postprocedure Evaluation (Signed)
Anesthesia Post Note  Patient: Lorretta Harp Neville  Procedure(s) Performed: Procedure(s) (LRB): RIGHT SUPERFICIAL PAROTIDECTOMY WITH NERVE DISSECTION (Right)  Patient location during evaluation: PACU Anesthesia Type: General Level of consciousness: awake and alert Pain management: pain level controlled Vital Signs Assessment: post-procedure vital signs reviewed and stable Respiratory status: spontaneous breathing, nonlabored ventilation and respiratory function stable Cardiovascular status: blood pressure returned to baseline and stable Postop Assessment: no signs of nausea or vomiting Anesthetic complications: no    Last Vitals:  Vitals:   07/18/16 1258 07/18/16 1301  BP: (!) 156/104 (!) 160/104  Pulse: (!) 104 (!) 101  Resp: 17 17  Temp:      Last Pain:  Vitals:   07/18/16 1300  TempSrc:   PainSc: 5                  Nilda Simmer

## 2016-07-18 NOTE — Brief Op Note (Signed)
07/18/2016  12:16 PM  PATIENT:  Shaun Murillo  82 y.o. male  PRE-OPERATIVE DIAGNOSIS:  Right parotid mass  POST-OPERATIVE DIAGNOSIS: Right parotid mass  PROCEDURE:  Procedure(s) with comments: RIGHT SUPERFICIAL PAROTIDECTOMY WITH NERVE DISSECTION (Right) - RIGHT PAROTIDECTOMY WITH SELECTIVE NECK DISSECTION  SURGEON:  Surgeon(s) and Role:    * Melida Quitter, MD - Primary  PHYSICIAN ASSISTANT: [place]  ASSISTANTS: none   ANESTHESIA:   general  EBL:  Total I/O In: 1000 [I.V.:1000] Out: 390 [Urine:350; Blood:40]  BLOOD ADMINISTERED:none  DRAINS: (7 Fr) Jackson-Pratt drain(s) with closed bulb suction in the right neck   LOCAL MEDICATIONS USED:  LIDOCAINE   SPECIMEN:  Source of Specimen:  right superficial parotid gland  DISPOSITION OF SPECIMEN:  PATHOLOGY  COUNTS:  YES  TOURNIQUET:  * No tourniquets in log *  DICTATION: .Other Dictation: Dictation Number [id number]  PLAN OF CARE: Admit for overnight observation  PATIENT DISPOSITION:  PACU - hemodynamically stable.   Delay start of Pharmacological VTE agent (>24hrs) due to surgical blood loss or risk of bleeding: yes

## 2016-07-19 ENCOUNTER — Encounter (HOSPITAL_COMMUNITY): Payer: Self-pay | Admitting: Otolaryngology

## 2016-07-19 DIAGNOSIS — D110 Benign neoplasm of parotid gland: Secondary | ICD-10-CM | POA: Diagnosis not present

## 2016-07-19 MED ORDER — HYDROCODONE-ACETAMINOPHEN 5-325 MG PO TABS: 1.0000 | ORAL_TABLET | ORAL | 0 refills | Status: DC | PRN

## 2016-07-19 NOTE — Progress Notes (Signed)
Discharge instructions reviewed with pt and prescriptions given.  Pt verbalized understanding and had no questions.  Pt discharged in stable condition via wheelchair with son.  Shaun Murillo

## 2016-07-19 NOTE — Op Note (Signed)
NAMEDEMECO, DUCKSWORTH NO.:  [id number]  MEDICAL RECORD NO.:  [id number]  LOCATION:  6N03C                        FACILITY:  [facility]  PHYSICIAN:  Onnie Graham, MD     DATE OF BIRTH:  06/12/1932  DATE OF PROCEDURE:  07/18/2016 DATE OF DISCHARGE:                              OPERATIVE REPORT   PREOPERATIVE DIAGNOSIS:  Right parotid tail mass.  POSTOPERATIVE DIAGNOSIS:  Right parotid tail mass.  PROCEDURE:  Right superficial parotidectomy with nerve dissection.  SURGEON:  Leane Para. Redmond Baseman, MD.  ASSISTANT:  Jolene Provost, PA.  ANESTHESIA:  General endotracheal anesthesia.  COMPLICATIONS:  None.  INDICATION:  The patient is an 82 year old male, who noticed a mass in the right upper neck a few months ago that has not changed in size much. It was fairly a moderately soft mass.  Fine-needle aspiration was inconclusive.  Thus, he presents to the operating room for surgical management.  FINDINGS:  The mass was in the inferior extent of the parotid gland and appears to represent a widely dilated venous malformation.  During the initial parts of the dissection, there was a pocket of cloudy yellow fluid encountered posteriorly and a culture was sent from this fluid.  DESCRIPTION OF PROCEDURE:  The patient was identified in the holding room, and informed consent having been obtained including discussion of risks, benefits, alternatives, the patient was brought to the operative suite and put on the table in supine position.  Anesthesia was induced, and the patient is intubated by the Anesthesia team without difficulty. The patient was given intravenous antibiotics during the case.  The eyes were taped closed and the nerve integrity monitor was placed for parotidectomy and turned on during the procedure.  The incision was marked with a marking pen and injected with 1% lidocaine with 1:100,000 epinephrine.  The right face was prepped and draped in sterile  fashion. Incision was made with a 15 blade scalpel through the skin and extended through the subcutaneous tissues using Bovie electrocautery.  A preparotid flap was then elevated anteriorly beyond the mass as well as the earlobe elevated posteriorly.  Stay sutures were then added. Dissection was then performed along the sternocleidomastoid muscle inferiorly elevating the mass.  During dissection between the sternocleidomastoid muscle and the mass, a pocket of cloudy yellow fluid was encountered and drained spontaneously.  Cultures, swabs were taken from this fluid and sent for culture.  The digastric muscle was also encountered in this part of dissection.  The great auricular nerve and external jugular vein were divided and ligated.  At this point, the dissection was carried out along the tragal cartilage carefully down through the stylomastoid foramen, which was carefully identified by landmarks.  The facial nerve was encountered and then dissected in an antegrade fashion keeping the nerve intact and dividing the parotid gland using bipolar electrocautery and scissors.  The inferior division nerves were dissected out in this fashion in an antegrade manner exposing the nerve branches and dissecting the mass in an inferior direction.  Dissection was then performed anterior to the mass as well as deep to it dissecting it from surrounding structures.  The major vein, which is  the retrofacial vein was then found to be feeding the large dilated venous structure that represented the mass.  It was divided and ligated very inferiorly at the sternocleidomastoid muscle and then also superiorly above the mass.  The specimen was then removed and passed to nursing for pathology after a photograph was made.  It was decided not sent for frozen section since the mass was soft and did not seem to represent neoplasm.  At this point after the mass was removed, the wound was copiously irrigated with saline.   A 7-French round drain was placed in the depth of the wound, secured to the skin using 2-0 nylon suture in a standard drain stitch.  The skin flaps were then released and closed in subcutaneous layer using 3-0 Vicryl suture in a simple running fashion and then the skin layer using 5-0 nylon in a simple running fashion.  The patient was then cleaned off and drapes removed.  Bacitracin ointment was added to the incision.  The drain was hooked to bulb suction and taped to the right shoulder.  He was returned to Anesthesia for wake-up, was extubated, moved to recovery room in stable condition.     Onnie Graham, MD     DDB/MEDQ  D:  07/18/2016  T:  07/19/2016  Job:  [id number]

## 2016-07-19 NOTE — Care Management Obs Status (Signed)
[place] NOTIFICATION   Patient Details  Name: [name] MRN: [id number] Date of Birth: January 07, 1932   Medicare Observation Status Notification Given:  Yes (Medicare procedure)    Marilu Favre, RN 07/19/2016, 8:38 AM

## 2016-07-19 NOTE — Discharge Summary (Signed)
Physician Discharge Summary  Patient ID: DELEON PASSE MRN: [id number] DOB/AGE: [birth year] 82 y.o.  Admit date: 07/18/2016 Discharge date: 07/19/2016  Admission Diagnoses: Right parotid mass  Discharge Diagnoses:  Active Problems:   Parotid mass   Discharged Condition: good  Hospital Course: 82 year old male with right parotid mass presented for excision.  See operative note.  He was observed overnight with drain in place and did well.  On POD 1, the drain was removed and he was felt stable for discharge.  Consults: None  Significant Diagnostic Studies: None  Treatments: surgery: Right parotidectomy  Discharge Exam: Blood pressure 139/78, pulse 70, temperature 98 F (36.7 C), temperature source Oral, resp. rate 18, SpO2 99 %. General appearance: alert, cooperative and no distress Neck: right parotid incision clean and intact, no fluid collection, drain removed, normal facial movement  Disposition: 01-Home or Self Care  Discharge Instructions    Diet - low sodium heart healthy    Complete by:  As directed   Discharge instructions    Complete by:  As directed   Avoid strenuous activity.  Keep diet bland, no sour or spicy.  Apply antibiotic ointment to incision twice daily.  Call with excessive swelling, fever, or purulent drainage.   Increase activity slowly    Complete by:  As directed       Medication List    TAKE these medications   amLODipine 5 MG tablet Commonly known as:  NORVASC Take 1 tablet by mouth daily.   aspirin EC 81 MG tablet Take 81 mg by mouth daily as needed for moderate pain.   HYDROcodone-acetaminophen 5-325 MG tablet Commonly known as:  NORCO/VICODIN Take 1-2 tablets by mouth every 4 (four) hours as needed for moderate pain.   losartan 100 MG tablet Commonly known as:  COZAAR Take 1 tablet by mouth daily.   omeprazole 40 MG capsule Commonly known as:  PRILOSEC Take 1 capsule by mouth daily.      Follow-up Information    Aashish Hamm,  MD. Schedule an appointment as soon as possible for a visit in 1 week(s).   Specialty:  Otolaryngology Contact information: [address] [place] [address] [phone]        Melida Quitter, MD .   Specialty:  Otolaryngology Contact information: [address] [place] [address] [phone]           SignedMelida Quitter 07/19/2016, 8:51 AM

## 2016-07-23 LAB — AEROBIC/ANAEROBIC CULTURE (SURGICAL/DEEP WOUND)

## 2016-07-23 LAB — AEROBIC/ANAEROBIC CULTURE W GRAM STAIN (SURGICAL/DEEP WOUND): Culture: NO GROWTH

## 2016-11-01 ENCOUNTER — Other Ambulatory Visit: Payer: Self-pay | Admitting: Ophthalmology

## 2018-07-30 ENCOUNTER — Other Ambulatory Visit: Payer: Self-pay | Admitting: Nephrology

## 2018-07-30 DIAGNOSIS — N184 Chronic kidney disease, stage 4 (severe): Secondary | ICD-10-CM

## 2018-08-04 ENCOUNTER — Ambulatory Visit
Admission: RE | Admit: 2018-08-04 | Discharge: 2018-08-04 | Disposition: A | Discharge status: Home / Self Care | Payer: Medicare Other | Source: Ambulatory Visit | Attending: Nephrology | Admitting: Nephrology

## 2018-08-04 DIAGNOSIS — N184 Chronic kidney disease, stage 4 (severe): Secondary | ICD-10-CM

## 2020-02-04 MED FILL — MUPIROCIN 2% OINTMENT: 2 | 7 days supply | Qty: 22 | Fill #0

## 2020-03-11 ENCOUNTER — Other Ambulatory Visit (HOSPITAL_COMMUNITY): Payer: Self-pay | Admitting: *Deleted

## 2020-03-11 DIAGNOSIS — N189 Chronic kidney disease, unspecified: Secondary | ICD-10-CM

## 2020-03-11 DIAGNOSIS — D631 Anemia in chronic kidney disease: Secondary | ICD-10-CM

## 2020-03-14 ENCOUNTER — Ambulatory Visit (HOSPITAL_COMMUNITY)
Admission: RE | Admit: 2020-03-14 | Discharge: 2020-03-14 | Disposition: A | Discharge status: Home / Self Care | Payer: Medicare Other | Source: Ambulatory Visit | Attending: Nephrology | Admitting: Nephrology

## 2020-03-14 ENCOUNTER — Other Ambulatory Visit: Payer: Self-pay

## 2020-03-14 DIAGNOSIS — N189 Chronic kidney disease, unspecified: Secondary | ICD-10-CM | POA: Diagnosis present

## 2020-03-14 DIAGNOSIS — D631 Anemia in chronic kidney disease: Secondary | ICD-10-CM | POA: Diagnosis present

## 2020-03-14 LAB — POCT HEMOGLOBIN-HEMACUE: Hemoglobin: 9.9 g/dL — ABNORMAL LOW (ref 13.0–17.0)

## 2020-03-14 MED ORDER — EPOETIN ALFA-EPBX 2000 UNIT/ML IJ SOLN
INTRAMUSCULAR | Status: AC
  Administered 2020-03-14: 2000 [IU]
  Filled 2020-03-14: qty 1

## 2020-03-14 MED ORDER — EPOETIN ALFA-EPBX 10000 UNIT/ML IJ SOLN
INTRAMUSCULAR | Status: AC
  Administered 2020-03-14: 10000 [IU]
  Filled 2020-03-14: qty 1

## 2020-03-14 MED ORDER — EPOETIN ALFA-EPBX 10000 UNIT/ML IJ SOLN: 15000.0000 [IU] | INTRAMUSCULAR | Status: DC

## 2020-03-14 MED ORDER — EPOETIN ALFA-EPBX 3000 UNIT/ML IJ SOLN
INTRAMUSCULAR | Status: AC
  Administered 2020-03-14: 3000 [IU]
  Filled 2020-03-14: qty 1

## 2020-03-14 NOTE — Discharge Instructions (Signed)

## 2020-03-28 ENCOUNTER — Other Ambulatory Visit: Payer: Self-pay

## 2020-03-28 ENCOUNTER — Ambulatory Visit (HOSPITAL_COMMUNITY)
Admission: RE | Admit: 2020-03-28 | Discharge: 2020-03-28 | Disposition: A | Discharge status: Home / Self Care | Payer: Medicare Other | Source: Ambulatory Visit | Attending: Nephrology | Admitting: Nephrology

## 2020-03-28 DIAGNOSIS — D631 Anemia in chronic kidney disease: Secondary | ICD-10-CM | POA: Diagnosis not present

## 2020-03-28 DIAGNOSIS — N185 Chronic kidney disease, stage 5: Secondary | ICD-10-CM | POA: Insufficient documentation

## 2020-03-28 LAB — POCT HEMOGLOBIN-HEMACUE: Hemoglobin: 10.2 g/dL — ABNORMAL LOW (ref 13.0–17.0)

## 2020-03-28 MED ORDER — EPOETIN ALFA-EPBX 3000 UNIT/ML IJ SOLN
INTRAMUSCULAR | Status: AC
  Administered 2020-03-28: 3000 [IU] via SUBCUTANEOUS
  Filled 2020-03-28: qty 1

## 2020-03-28 MED ORDER — EPOETIN ALFA-EPBX 2000 UNIT/ML IJ SOLN
INTRAMUSCULAR | Status: AC
  Administered 2020-03-28: 2000 [IU] via SUBCUTANEOUS
  Filled 2020-03-28: qty 1

## 2020-03-28 MED ORDER — EPOETIN ALFA-EPBX 10000 UNIT/ML IJ SOLN: 15000.0000 [IU] | INTRAMUSCULAR | Status: DC

## 2020-03-28 MED ORDER — EPOETIN ALFA-EPBX 10000 UNIT/ML IJ SOLN
INTRAMUSCULAR | Status: AC
  Administered 2020-03-28: 10000 [IU] via SUBCUTANEOUS
  Filled 2020-03-28: qty 1

## 2020-04-12 ENCOUNTER — Other Ambulatory Visit: Payer: Self-pay

## 2020-04-12 ENCOUNTER — Encounter (HOSPITAL_COMMUNITY)
Admission: RE | Admit: 2020-04-12 | Discharge: 2020-04-12 | Disposition: A | Discharge status: Home / Self Care | Payer: Medicare Other | Source: Ambulatory Visit | Attending: Nephrology | Admitting: Nephrology

## 2020-04-12 DIAGNOSIS — N185 Chronic kidney disease, stage 5: Secondary | ICD-10-CM | POA: Diagnosis not present

## 2020-04-12 DIAGNOSIS — D631 Anemia in chronic kidney disease: Secondary | ICD-10-CM | POA: Insufficient documentation

## 2020-04-12 LAB — IRON AND TIBC
Iron: 57 ug/dL (ref 45–182)
Saturation Ratios: 16 % — ABNORMAL LOW (ref 17.9–39.5)
TIBC: 346 ug/dL (ref 250–450)
UIBC: 289 ug/dL

## 2020-04-12 LAB — FERRITIN: Ferritin: 86 ng/mL (ref 24–336)

## 2020-04-12 LAB — POCT HEMOGLOBIN-HEMACUE: Hemoglobin: 10.5 g/dL — ABNORMAL LOW (ref 13.0–17.0)

## 2020-04-12 MED ORDER — EPOETIN ALFA-EPBX 2000 UNIT/ML IJ SOLN
INTRAMUSCULAR | Status: AC
  Administered 2020-04-12: 2000 [IU]
  Filled 2020-04-12: qty 1

## 2020-04-12 MED ORDER — EPOETIN ALFA-EPBX 10000 UNIT/ML IJ SOLN: 15000.0000 [IU] | INTRAMUSCULAR | Status: DC

## 2020-04-12 MED ORDER — EPOETIN ALFA-EPBX 3000 UNIT/ML IJ SOLN
INTRAMUSCULAR | Status: AC
  Administered 2020-04-12: 3000 [IU]
  Filled 2020-04-12: qty 1

## 2020-04-12 MED ORDER — EPOETIN ALFA-EPBX 10000 UNIT/ML IJ SOLN
INTRAMUSCULAR | Status: AC
  Administered 2020-04-12: 10000 [IU]
  Filled 2020-04-12: qty 1

## 2020-04-22 ENCOUNTER — Other Ambulatory Visit (HOSPITAL_COMMUNITY): Payer: Self-pay | Admitting: *Deleted

## 2020-04-22 NOTE — Discharge Instructions (Signed)

## 2020-04-25 ENCOUNTER — Inpatient Hospital Stay (HOSPITAL_COMMUNITY)
Admission: RE | Admit: 2020-04-25 | Discharge: 2020-04-25 | Disposition: A | Discharge status: Home / Self Care | Payer: Medicare Other | Source: Ambulatory Visit | Attending: Nephrology | Admitting: Nephrology

## 2020-04-25 ENCOUNTER — Encounter (HOSPITAL_COMMUNITY): Payer: Self-pay

## 2020-04-26 ENCOUNTER — Encounter (HOSPITAL_COMMUNITY): Payer: Medicare Other

## 2020-05-04 ENCOUNTER — Other Ambulatory Visit: Payer: Self-pay

## 2020-05-04 ENCOUNTER — Encounter (HOSPITAL_COMMUNITY)
Admission: RE | Admit: 2020-05-04 | Discharge: 2020-05-04 | Disposition: A | Discharge status: Home / Self Care | Payer: Medicare Other | Source: Ambulatory Visit | Attending: Nephrology | Admitting: Nephrology

## 2020-05-04 DIAGNOSIS — N185 Chronic kidney disease, stage 5: Secondary | ICD-10-CM | POA: Diagnosis not present

## 2020-05-04 DIAGNOSIS — D631 Anemia in chronic kidney disease: Secondary | ICD-10-CM | POA: Diagnosis not present

## 2020-05-04 LAB — POCT HEMOGLOBIN-HEMACUE: Hemoglobin: 10.8 g/dL — ABNORMAL LOW (ref 13.0–17.0)

## 2020-05-04 MED ORDER — EPOETIN ALFA-EPBX 10000 UNIT/ML IJ SOLN
INTRAMUSCULAR | Status: AC
  Administered 2020-05-04: 10000 [IU] via SUBCUTANEOUS
  Filled 2020-05-04: qty 1

## 2020-05-04 MED ORDER — EPOETIN ALFA-EPBX 3000 UNIT/ML IJ SOLN
INTRAMUSCULAR | Status: AC
  Administered 2020-05-04: 3000 [IU] via SUBCUTANEOUS
  Filled 2020-05-04: qty 1

## 2020-05-04 MED ORDER — EPOETIN ALFA-EPBX 2000 UNIT/ML IJ SOLN
INTRAMUSCULAR | Status: AC
  Administered 2020-05-04: 2000 [IU] via SUBCUTANEOUS
  Filled 2020-05-04: qty 1

## 2020-05-04 MED ORDER — SODIUM CHLORIDE 0.9 % IV SOLN
510.0000 mg | INTRAVENOUS | Status: DC
  Administered 2020-05-04: 510 mg via INTRAVENOUS
  Filled 2020-05-04: qty 17

## 2020-05-04 MED ORDER — EPOETIN ALFA-EPBX 10000 UNIT/ML IJ SOLN: 15000.0000 [IU] | INTRAMUSCULAR | Status: DC

## 2020-05-09 ENCOUNTER — Encounter (HOSPITAL_COMMUNITY): Payer: Medicare Other

## 2020-05-18 ENCOUNTER — Encounter (HOSPITAL_COMMUNITY)
Admission: RE | Admit: 2020-05-18 | Discharge: 2020-05-18 | Disposition: A | Discharge status: Home / Self Care | Payer: Medicare Other | Source: Ambulatory Visit | Attending: Nephrology | Admitting: Nephrology

## 2020-05-18 ENCOUNTER — Other Ambulatory Visit: Payer: Self-pay

## 2020-05-18 DIAGNOSIS — D631 Anemia in chronic kidney disease: Secondary | ICD-10-CM | POA: Diagnosis not present

## 2020-05-18 DIAGNOSIS — N185 Chronic kidney disease, stage 5: Secondary | ICD-10-CM | POA: Insufficient documentation

## 2020-05-18 LAB — POCT HEMOGLOBIN-HEMACUE: Hemoglobin: 10.6 g/dL — ABNORMAL LOW (ref 13.0–17.0)

## 2020-05-18 MED ORDER — EPOETIN ALFA-EPBX 10000 UNIT/ML IJ SOLN
INTRAMUSCULAR | Status: AC
  Administered 2020-05-18: 10000 [IU] via SUBCUTANEOUS
  Filled 2020-05-18: qty 1

## 2020-05-18 MED ORDER — SODIUM CHLORIDE 0.9 % IV SOLN
510.0000 mg | INTRAVENOUS | Status: AC
  Administered 2020-05-18: 510 mg via INTRAVENOUS
  Filled 2020-05-18: qty 17

## 2020-05-18 MED ORDER — EPOETIN ALFA-EPBX 10000 UNIT/ML IJ SOLN: 15000.0000 [IU] | INTRAMUSCULAR | Status: DC

## 2020-05-18 MED ORDER — EPOETIN ALFA-EPBX 3000 UNIT/ML IJ SOLN
INTRAMUSCULAR | Status: AC
  Administered 2020-05-18: 3000 [IU] via SUBCUTANEOUS
  Filled 2020-05-18: qty 1

## 2020-05-18 MED ORDER — EPOETIN ALFA-EPBX 2000 UNIT/ML IJ SOLN
INTRAMUSCULAR | Status: AC
  Administered 2020-05-18: 2000 [IU] via SUBCUTANEOUS
  Filled 2020-05-18: qty 1

## 2020-06-01 ENCOUNTER — Other Ambulatory Visit: Payer: Self-pay

## 2020-06-01 ENCOUNTER — Encounter (HOSPITAL_COMMUNITY)
Admission: RE | Admit: 2020-06-01 | Discharge: 2020-06-01 | Disposition: A | Discharge status: Home / Self Care | Payer: Medicare Other | Source: Ambulatory Visit | Attending: Nephrology | Admitting: Nephrology

## 2020-06-01 DIAGNOSIS — N185 Chronic kidney disease, stage 5: Secondary | ICD-10-CM | POA: Diagnosis not present

## 2020-06-01 LAB — IRON AND TIBC
Iron: 100 ug/dL (ref 45–182)
Saturation Ratios: 37 % (ref 17.9–39.5)
TIBC: 273 ug/dL (ref 250–450)
UIBC: 173 ug/dL

## 2020-06-01 LAB — FERRITIN: Ferritin: 575 ng/mL — ABNORMAL HIGH (ref 24–336)

## 2020-06-01 MED ORDER — EPOETIN ALFA-EPBX 3000 UNIT/ML IJ SOLN
INTRAMUSCULAR | Status: AC
  Filled 2020-06-01: qty 1

## 2020-06-01 MED ORDER — EPOETIN ALFA-EPBX 2000 UNIT/ML IJ SOLN
INTRAMUSCULAR | Status: AC
  Filled 2020-06-01: qty 1

## 2020-06-01 MED ORDER — EPOETIN ALFA-EPBX 10000 UNIT/ML IJ SOLN: 15000.0000 [IU] | INTRAMUSCULAR | Status: DC

## 2020-06-01 MED ORDER — EPOETIN ALFA-EPBX 10000 UNIT/ML IJ SOLN
INTRAMUSCULAR | Status: AC
  Filled 2020-06-01: qty 1

## 2020-06-02 LAB — POCT HEMOGLOBIN-HEMACUE: Hemoglobin: 12.2 g/dL — ABNORMAL LOW (ref 13.0–17.0)

## 2020-06-12 ENCOUNTER — Emergency Department (HOSPITAL_COMMUNITY)
Admission: EM | Admit: 2020-06-12 | Discharge: 2020-06-13 | Disposition: A | Discharge status: Home / Self Care | Payer: Medicare Other | Attending: Emergency Medicine | Admitting: Emergency Medicine

## 2020-06-12 ENCOUNTER — Encounter (HOSPITAL_COMMUNITY): Payer: Self-pay

## 2020-06-12 ENCOUNTER — Other Ambulatory Visit: Payer: Self-pay

## 2020-06-12 DIAGNOSIS — R197 Diarrhea, unspecified: Secondary | ICD-10-CM | POA: Diagnosis not present

## 2020-06-12 DIAGNOSIS — R109 Unspecified abdominal pain: Secondary | ICD-10-CM | POA: Diagnosis not present

## 2020-06-12 DIAGNOSIS — Z7982 Long term (current) use of aspirin: Secondary | ICD-10-CM | POA: Insufficient documentation

## 2020-06-12 DIAGNOSIS — R112 Nausea with vomiting, unspecified: Secondary | ICD-10-CM | POA: Diagnosis present

## 2020-06-12 DIAGNOSIS — I129 Hypertensive chronic kidney disease with stage 1 through stage 4 chronic kidney disease, or unspecified chronic kidney disease: Secondary | ICD-10-CM | POA: Diagnosis not present

## 2020-06-12 DIAGNOSIS — N184 Chronic kidney disease, stage 4 (severe): Secondary | ICD-10-CM | POA: Insufficient documentation

## 2020-06-12 DIAGNOSIS — R42 Dizziness and giddiness: Secondary | ICD-10-CM | POA: Diagnosis not present

## 2020-06-12 DIAGNOSIS — Z79899 Other long term (current) drug therapy: Secondary | ICD-10-CM | POA: Diagnosis not present

## 2020-06-12 LAB — COMPREHENSIVE METABOLIC PANEL
ALT: <5 U/L (ref 0–44)
AST: 18 U/L (ref 15–41)
Albumin: 3.5 g/dL (ref 3.5–5.0)
Alkaline Phosphatase: 108 U/L (ref 38–126)
Anion gap: 15 (ref 5–15)
BUN: 65 mg/dL — ABNORMAL HIGH (ref 8–23)
CO2: 20 mmol/L — ABNORMAL LOW (ref 22–32)
Calcium: 8.4 mg/dL — ABNORMAL LOW (ref 8.9–10.3)
Chloride: 103 mmol/L (ref 98–111)
Creatinine, Ser: 8.51 mg/dL — ABNORMAL HIGH (ref 0.61–1.24)
GFR calc Af Amer: 6 mL/min — ABNORMAL LOW (ref >60)
GFR calc non Af Amer: 5 mL/min — ABNORMAL LOW (ref >60)
Glucose, Bld: 119 mg/dL — ABNORMAL HIGH (ref 70–99)
Potassium: 4.4 mmol/L (ref 3.5–5.1)
Sodium: 138 mmol/L (ref 135–145)
Total Bilirubin: 0.7 mg/dL (ref 0.3–1.2)
Total Protein: 5.8 g/dL — ABNORMAL LOW (ref 6.5–8.1)

## 2020-06-12 LAB — CBC
HCT: 36.8 % — ABNORMAL LOW (ref 39.0–52.0)
Hemoglobin: 11.8 g/dL — ABNORMAL LOW (ref 13.0–17.0)
MCH: 30.9 pg (ref 26.0–34.0)
MCHC: 32.1 g/dL (ref 30.0–36.0)
MCV: 96.3 fL (ref 80.0–100.0)
Platelets: 259 10*3/uL (ref 150–400)
RBC: 3.82 MIL/uL — ABNORMAL LOW (ref 4.22–5.81)
RDW: 14.2 % (ref 11.5–15.5)
WBC: 9.9 10*3/uL (ref 4.0–10.5)
nRBC: 0 % (ref 0.0–0.2)

## 2020-06-12 LAB — LIPASE, BLOOD: Lipase: 27 U/L (ref 11–51)

## 2020-06-12 NOTE — ED Triage Notes (Signed)
Pt BIB [facility] for eval of N/V/D and dizziness for approx 2 hours PTA. Pt had peritoneal dialysis access placed about 2 mos ago and plan to start PD in mid august. EMS reports bigeminy and run PVCs, no chest pain w/ EMS.

## 2020-06-12 NOTE — ED Notes (Signed)
Makaio Mach (Son) would like update [phone]

## 2020-06-13 ENCOUNTER — Emergency Department (HOSPITAL_COMMUNITY): Payer: Medicare Other

## 2020-06-13 LAB — URINALYSIS, ROUTINE W REFLEX MICROSCOPIC
Bacteria, UA: NONE SEEN
Bilirubin Urine: NEGATIVE
Glucose, UA: 150 mg/dL — AB
Ketones, ur: NEGATIVE mg/dL
Leukocytes,Ua: NEGATIVE
Nitrite: NEGATIVE
Protein, ur: >=300 mg/dL — AB
Specific Gravity, Urine: 1.014 (ref 1.005–1.030)
pH: 7 (ref 5.0–8.0)

## 2020-06-13 LAB — IRON AND TIBC
Iron: 19 ug/dL — ABNORMAL LOW (ref 45–182)
Saturation Ratios: 9 % — ABNORMAL LOW (ref 17.9–39.5)
TIBC: 204 ug/dL — ABNORMAL LOW (ref 250–450)
UIBC: 185 ug/dL

## 2020-06-13 LAB — TROPONIN I (HIGH SENSITIVITY): Troponin I (High Sensitivity): 15 ng/L (ref <18)

## 2020-06-13 LAB — FERRITIN: Ferritin: 732 ng/mL — ABNORMAL HIGH (ref 24–336)

## 2020-06-13 MED ORDER — ACETAMINOPHEN 325 MG PO TABS
650.0000 mg | ORAL_TABLET | Freq: Once | ORAL | Status: DC
  Filled 2020-06-13: qty 2

## 2020-06-13 MED ORDER — EPOETIN ALFA-EPBX 10000 UNIT/ML IJ SOLN: 15000.0000 [IU] | INTRAMUSCULAR | Status: DC

## 2020-06-13 NOTE — Discharge Instructions (Addendum)
Call your dialysis center to reschedule your line flush for later this week.  You may have had a vertigo episode at home.  Please read over the attached instructions.  If you have severe symptoms again, or new symptoms with chest pain, difficulty breathing, or feel like passing out, please return to the ER immediately.

## 2020-06-13 NOTE — ED Provider Notes (Signed)
[facility] EMERGENCY DEPARTMENT Provider Note   CSN: [id number] Arrival date & time: 06/12/20  2040     History Chief Complaint  Patient presents with  . Emesis  . Dizziness    Shaun Murillo is a 86 y.o. male with a history of chronic kidney disease, status post peritoneal dialysis catheter placement 1 week ago, pending initiation of peritoneal dialysis, history of hypertension, presented to emergency department with episode of nausea, vomiting and dizziness.  He reports he ate [place] food yesterday (rice and noodles), and 1 hour later began having vertigo symptoms, along with an upset stomach, with loose bowel movements, dry heaving, and "felt dizzy, like I was staggering from one side to another."  He reports lightheadedness with his vertigo.  He was on the toilet for 30 minutes for "diarrhea."  He came to our ED last night, and spent approximately 11 hours in the waiting room prior to being seen by myself at the start of my shift at 8 am.  Since sitting in the waiting room, his symptoms have all abated.  He no longer feels dizzy or nauseous and has not had diarrhea.  He has no prior hx of vertigo.    2 other family members ate the same take out food but no one else got sick.  His son at bedside reports the patient was supposed to have his dialysis catheter line flushed today.  He has not stared dialysis yet and does make regular urine daily.  Nephrologist is Dr Carolin Sicks.  Patient reports he has had abdominal pain since having his dialysis line placed on 06/07/20.  He takes tylenol at home for that.  NO change in pain pattern.  NO fevers this week.  The patient has no hx of stroke or TIA.  He takes aspirin 81 mg daily.  HPI     Past Medical History:  Diagnosis Date  . Arthritis   . Chronic kidney disease (CKD), stage IV (severe) ([place])   . Complication of anesthesia   . Hard of hearing   . Hypertension   . Kidney stones   . PONV (postoperative nausea and  vomiting)     Patient Active Problem List   Diagnosis Date Noted  . Parotid mass 07/18/2016    Past Surgical History:  Procedure Laterality Date  . BACK SURGERY    . COLONOSCOPY    . ESOPHAGOGASTRODUODENOSCOPY    . EYE SURGERY     cataracts bilateral  . PAROTIDECTOMY Right 07/18/2016   Procedure: RIGHT SUPERFICIAL PAROTIDECTOMY WITH NERVE DISSECTION;  Surgeon: Melida Quitter, MD;  Location: [facility];  Service: ENT;  Laterality: Right;  RIGHT PAROTIDECTOMY WITH SELECTIVE NECK DISSECTION  . SHOULDER SURGERY Right        History reviewed. No pertinent family history.  Social History   Tobacco Use  . Smoking status: Never Smoker  . Smokeless tobacco: Never Used  Substance Use Topics  . Alcohol use: No  . Drug use: No    Home Medications Prior to Admission medications   Medication Sig Start Date End Date Taking? Authorizing Provider  amLODipine (NORVASC) 5 MG tablet Take 1 tablet by mouth daily. 06/01/16   [provider]  aspirin EC 81 MG tablet Take 81 mg by mouth daily as needed for moderate pain.    [provider]  HYDROcodone-acetaminophen (NORCO/VICODIN) 5-325 MG tablet Take 1-2 tablets by mouth every 4 (four) hours as needed for moderate pain. 07/19/16   Melida Quitter, MD  losartan (  COZAAR) 100 MG tablet Take 1 tablet by mouth daily. 06/07/16   [provider]  omeprazole (PRILOSEC) 40 MG capsule Take 1 capsule by mouth daily. 06/01/16   [provider]    Allergies    Codeine and Penicillins  Review of Systems   Review of Systems  Constitutional: Negative for chills and fever.  HENT: Negative for ear pain and sore throat.   Eyes: Negative for pain and visual disturbance.  Respiratory: Negative for cough and shortness of breath.   Cardiovascular: Negative for chest pain and palpitations.  Gastrointestinal: Positive for abdominal pain, diarrhea, nausea and vomiting.  Genitourinary: Negative for dysuria and hematuria.  Musculoskeletal:  Negative for arthralgias and back pain.  Skin: Negative for color change and rash.  Neurological: Positive for dizziness and light-headedness. Negative for syncope, weakness and numbness.  All other systems reviewed and are negative.   Physical Exam Updated Vital Signs BP (!) 155/77   Pulse 71   Temp 98.1 F (36.7 C) (Oral)   Resp 19   Ht 5\' 10"  (1.778 m)   Wt 68.5 kg   SpO2 98%   BMI 21.67 kg/m   Physical Exam Vitals and nursing note reviewed.  Constitutional:      Appearance: He is well-developed.  HENT:     Head: Normocephalic and atraumatic.  Eyes:     General: No visual field deficit.    Conjunctiva/sclera: Conjunctivae normal.  Cardiovascular:     Rate and Rhythm: Normal rate and regular rhythm.     Pulses: Normal pulses.  Pulmonary:     Effort: Pulmonary effort is normal. No respiratory distress.     Breath sounds: Normal breath sounds.  Abdominal:     Palpations: Abdomen is soft.     Comments: Mild diffuse generalized tenderness without rigidity or abdominal distension, most focally around dialysis line site in RLQ Ecchymosis over mid-lower abdomen near incision site NO erythema, purulence, fluctuance of wound sites.  Musculoskeletal:     Cervical back: Neck supple.  Skin:    General: Skin is warm and dry.  Neurological:     General: No focal deficit present.     Mental Status: He is alert and oriented to person, place, and time.     GCS: GCS eye subscore is 4. GCS verbal subscore is 5. GCS motor subscore is 6.     Cranial Nerves: Cranial nerves are intact. No cranial nerve deficit, dysarthria or facial asymmetry.     Sensory: Sensation is intact.     Motor: Motor function is intact.     Coordination: Coordination is intact. Finger-Nose-Finger Test normal.     Gait: Gait is intact.  Psychiatric:        Mood and Affect: Mood normal.        Behavior: Behavior normal.     ED Results / Procedures / Treatments   Labs (all labs ordered are listed, but  only abnormal results are displayed) Labs Reviewed  COMPREHENSIVE METABOLIC PANEL - Abnormal; Notable for the following components:      Result Value   CO2 20 (*)    Glucose, Bld 119 (*)    BUN 65 (*)    Creatinine, Ser 8.51 (*)    Calcium 8.4 (*)    Total Protein 5.8 (*)    GFR calc non Af Amer 5 (*)    GFR calc Af Amer 6 (*)    All other components within normal limits  CBC - Abnormal; Notable for the following  components:   RBC 3.82 (*)    Hemoglobin 11.8 (*)    HCT 36.8 (*)    All other components within normal limits  URINALYSIS, ROUTINE W REFLEX MICROSCOPIC - Abnormal; Notable for the following components:   Glucose, UA 150 (*)    Hgb urine dipstick SMALL (*)    Protein, ur >=300 (*)    All other components within normal limits  FERRITIN - Abnormal; Notable for the following components:   Ferritin 732 (*)    All other components within normal limits  IRON AND TIBC - Abnormal; Notable for the following components:   Iron 19 (*)    TIBC 204 (*)    Saturation Ratios 9 (*)    All other components within normal limits  LIPASE, BLOOD  TROPONIN I (HIGH SENSITIVITY)  TROPONIN I (HIGH SENSITIVITY)    EKG EKG Interpretation  Date/Time:  Sunday June 12 2020 20:52:36 EDT Ventricular Rate:  85 PR Interval:  136 QRS Duration: 72 QT Interval:  378 QTC Calculation: 449 R Axis:   42 Text Interpretation: Sinus rhythm with Premature atrial complexes Otherwise normal ECG No STEMI Confirmed by Octaviano Glow [phone]) on 06/13/2020 8:04:51 AM   Radiology CT Head Wo Contrast  Result Date: 06/13/2020 CLINICAL DATA:  Dialysis patient with dizziness. EXAM: CT HEAD WITHOUT CONTRAST TECHNIQUE: Contiguous axial images were obtained from the base of the skull through the vertex without intravenous contrast. COMPARISON:  None. FINDINGS: Brain: Age related volume loss. Mild chronic appearing small vessel ischemic changes of the cerebral hemispheric white matter. No cortical or large vessel  territory infarction. No focal posterior circulation insult is evident. No mass, hemorrhage, hydrocephalus or extra-axial collection. Vascular: There is atherosclerotic calcification of the major vessels at the base of the brain. Skull: Negative Sinuses/Orbits: Clear/normal Other: None IMPRESSION: No acute finding. Age related atrophy. Mild chronic small-vessel change of the cerebral hemispheric white matter. Electronically Signed   By: Nelson Chimes M.D.   On: 06/13/2020 09:26    Procedures Procedures (including critical care time)  Medications Ordered in ED Medications - No data to display  ED Course  I have reviewed the triage vital signs and the nursing notes.  Pertinent labs & imaging results that were available during my care of the patient were reviewed by me and considered in my medical decision making (see chart for details).  86 yo male here with episode of vertigo, nausea, vomiting and diarrhea yesterday after eating take-out [place] food.    1.  Vertigo/n/v/diarrhea - This may have been a food-borne illness, particularly with his diarrhea, triggering also his vertigo - He has a benign neurological exam today.  CTH negative for acute stroke findings.  I have a low suspicion for stroke at this time. - ECG per my interpretation is nonischemic.  He has no chest pain or symptoms currently.Trop is 15 well after 12 hours of symptoms - this wasn't likely a PE or ACS.   - Arrhythmia also remains on differential, but with his GI symptoms and diarrhea, less likely the cause.  His telemetry here shows stable sinus rhythm.  2. Abdominal pain  - Very likely chronic since his operation.  NO change in pattern.  No fever or leukocytosis or significant abdominal ttp to suggest SBP or abscess or infection at this time. - I spoke to nephrologist on call Dr Joelyn Oms who recommended pt can get line flushed non-emergently in the clinic this week.  Does not need to happen today.  Patient and his son at  bedside informed of w/u and plan, and satisfied with these.  He will go home.  His son lives next door to him and checks on him regularly.  I personally reviewed his labs as noted above. I reviewed his prior medical records as noted above.   Clinical Course as of Jun 14 1743  Mon Jun 13, 2020  1021 No acute findings on [facility]   [MT]  1138 Still asymptomatic in the Ed, okay for discharge.  I spoke to Dr Joelyn Oms from nephrology who advised the patient call their clinic to reschedule his dialysis line flush for later this week, and stated this was non-emergent   [MT]    Clinical Course User Index [MT] Korey Arroyo, Carola Rhine, MD    Final Clinical Impression(s) / ED Diagnoses Final diagnoses:  Vertigo    Rx / DC Orders ED Discharge Orders    None       Langston Masker Carola Rhine, MD 06/13/20 1744

## 2020-06-14 ENCOUNTER — Other Ambulatory Visit (HOSPITAL_COMMUNITY): Payer: Self-pay | Admitting: *Deleted

## 2020-06-14 ENCOUNTER — Encounter: Payer: Self-pay | Admitting: Nephrology

## 2020-06-14 DIAGNOSIS — D631 Anemia in chronic kidney disease: Secondary | ICD-10-CM | POA: Insufficient documentation

## 2020-06-15 ENCOUNTER — Ambulatory Visit (HOSPITAL_COMMUNITY)
Admission: RE | Admit: 2020-06-15 | Discharge: 2020-06-15 | Disposition: A | Discharge status: Home / Self Care | Payer: Medicare Other | Source: Ambulatory Visit | Attending: Nephrology | Admitting: Nephrology

## 2020-06-15 ENCOUNTER — Other Ambulatory Visit: Payer: Self-pay

## 2020-06-15 VITALS — BP 143/60 | HR 82 | Temp 97.9°F | Resp 20

## 2020-06-15 DIAGNOSIS — D631 Anemia in chronic kidney disease: Secondary | ICD-10-CM | POA: Insufficient documentation

## 2020-06-15 DIAGNOSIS — N185 Chronic kidney disease, stage 5: Secondary | ICD-10-CM | POA: Diagnosis not present

## 2020-06-15 LAB — POCT HEMOGLOBIN-HEMACUE: Hemoglobin: 11.1 g/dL — ABNORMAL LOW (ref 13.0–17.0)

## 2020-06-15 MED ORDER — EPOETIN ALFA-EPBX 3000 UNIT/ML IJ SOLN
INTRAMUSCULAR | Status: AC
  Filled 2020-06-15: qty 1

## 2020-06-15 MED ORDER — EPOETIN ALFA-EPBX 2000 UNIT/ML IJ SOLN
INTRAMUSCULAR | Status: AC
  Filled 2020-06-15: qty 1

## 2020-06-15 MED ORDER — EPOETIN ALFA-EPBX 10000 UNIT/ML IJ SOLN: 5000.0000 [IU] | Freq: Once | INTRAMUSCULAR | Status: DC

## 2020-06-15 MED ORDER — EPOETIN ALFA-EPBX 10000 UNIT/ML IJ SOLN
INTRAMUSCULAR | Status: AC
  Filled 2020-06-15: qty 1

## 2020-06-15 MED ORDER — EPOETIN ALFA-EPBX 10000 UNIT/ML IJ SOLN
15000.0000 [IU] | INTRAMUSCULAR | Status: DC
  Administered 2020-06-15: 15000 [IU] via SUBCUTANEOUS

## 2020-06-25 DIAGNOSIS — K219 Gastro-esophageal reflux disease without esophagitis: Secondary | ICD-10-CM | POA: Insufficient documentation

## 2020-06-25 DIAGNOSIS — M109 Gout, unspecified: Secondary | ICD-10-CM | POA: Insufficient documentation

## 2020-06-29 ENCOUNTER — Other Ambulatory Visit: Payer: Self-pay

## 2020-06-29 ENCOUNTER — Encounter (HOSPITAL_COMMUNITY)
Admission: RE | Admit: 2020-06-29 | Discharge: 2020-06-29 | Disposition: A | Discharge status: Home / Self Care | Payer: Medicare Other | Source: Ambulatory Visit | Attending: Nephrology | Admitting: Nephrology

## 2020-06-29 VITALS — BP 153/87 | HR 80 | Temp 98.5°F | Resp 20

## 2020-06-29 DIAGNOSIS — D631 Anemia in chronic kidney disease: Secondary | ICD-10-CM

## 2020-06-29 DIAGNOSIS — N185 Chronic kidney disease, stage 5: Secondary | ICD-10-CM | POA: Diagnosis not present

## 2020-06-29 LAB — POCT HEMOGLOBIN-HEMACUE: Hemoglobin: 11.5 g/dL — ABNORMAL LOW (ref 13.0–17.0)

## 2020-06-29 MED ORDER — EPOETIN ALFA-EPBX 10000 UNIT/ML IJ SOLN: 15000.0000 [IU] | INTRAMUSCULAR | Status: DC

## 2020-06-29 MED ORDER — EPOETIN ALFA-EPBX 2000 UNIT/ML IJ SOLN
INTRAMUSCULAR | Status: AC
  Administered 2020-06-29: 2000 [IU]
  Filled 2020-06-29: qty 1

## 2020-06-29 MED ORDER — EPOETIN ALFA-EPBX 3000 UNIT/ML IJ SOLN
INTRAMUSCULAR | Status: AC
  Administered 2020-06-29: 3000 [IU]
  Filled 2020-06-29: qty 1

## 2020-06-29 MED ORDER — EPOETIN ALFA-EPBX 10000 UNIT/ML IJ SOLN
INTRAMUSCULAR | Status: AC
  Administered 2020-06-29: 10000 [IU]
  Filled 2020-06-29: qty 1

## 2020-07-13 ENCOUNTER — Encounter (HOSPITAL_COMMUNITY): Payer: Medicare Other

## 2020-11-12 DIAGNOSIS — Z23 Encounter for immunization: Secondary | ICD-10-CM | POA: Diagnosis not present

## 2020-11-12 DIAGNOSIS — Z4932 Encounter for adequacy testing for peritoneal dialysis: Secondary | ICD-10-CM | POA: Diagnosis not present

## 2020-11-12 DIAGNOSIS — D631 Anemia in chronic kidney disease: Secondary | ICD-10-CM | POA: Diagnosis not present

## 2020-11-12 DIAGNOSIS — Z992 Dependence on renal dialysis: Secondary | ICD-10-CM | POA: Diagnosis not present

## 2020-11-12 DIAGNOSIS — N2581 Secondary hyperparathyroidism of renal origin: Secondary | ICD-10-CM | POA: Diagnosis not present

## 2020-11-12 DIAGNOSIS — N186 End stage renal disease: Secondary | ICD-10-CM | POA: Diagnosis not present

## 2020-11-13 DIAGNOSIS — N186 End stage renal disease: Secondary | ICD-10-CM | POA: Diagnosis not present

## 2020-11-13 DIAGNOSIS — Z23 Encounter for immunization: Secondary | ICD-10-CM | POA: Diagnosis not present

## 2020-11-13 DIAGNOSIS — Z4932 Encounter for adequacy testing for peritoneal dialysis: Secondary | ICD-10-CM | POA: Diagnosis not present

## 2020-11-13 DIAGNOSIS — D631 Anemia in chronic kidney disease: Secondary | ICD-10-CM | POA: Diagnosis not present

## 2020-11-13 DIAGNOSIS — Z992 Dependence on renal dialysis: Secondary | ICD-10-CM | POA: Diagnosis not present

## 2020-11-13 DIAGNOSIS — N2581 Secondary hyperparathyroidism of renal origin: Secondary | ICD-10-CM | POA: Diagnosis not present

## 2020-11-14 DIAGNOSIS — Z4932 Encounter for adequacy testing for peritoneal dialysis: Secondary | ICD-10-CM | POA: Diagnosis not present

## 2020-11-14 DIAGNOSIS — N186 End stage renal disease: Secondary | ICD-10-CM | POA: Diagnosis not present

## 2020-11-14 DIAGNOSIS — Z23 Encounter for immunization: Secondary | ICD-10-CM | POA: Diagnosis not present

## 2020-11-14 DIAGNOSIS — N2581 Secondary hyperparathyroidism of renal origin: Secondary | ICD-10-CM | POA: Diagnosis not present

## 2020-11-14 DIAGNOSIS — Z992 Dependence on renal dialysis: Secondary | ICD-10-CM | POA: Diagnosis not present

## 2020-11-14 DIAGNOSIS — D631 Anemia in chronic kidney disease: Secondary | ICD-10-CM | POA: Diagnosis not present

## 2020-11-15 DIAGNOSIS — N2581 Secondary hyperparathyroidism of renal origin: Secondary | ICD-10-CM | POA: Diagnosis not present

## 2020-11-15 DIAGNOSIS — Z4932 Encounter for adequacy testing for peritoneal dialysis: Secondary | ICD-10-CM | POA: Diagnosis not present

## 2020-11-15 DIAGNOSIS — D631 Anemia in chronic kidney disease: Secondary | ICD-10-CM | POA: Diagnosis not present

## 2020-11-15 DIAGNOSIS — Z992 Dependence on renal dialysis: Secondary | ICD-10-CM | POA: Diagnosis not present

## 2020-11-15 DIAGNOSIS — N186 End stage renal disease: Secondary | ICD-10-CM | POA: Diagnosis not present

## 2020-11-15 DIAGNOSIS — Z23 Encounter for immunization: Secondary | ICD-10-CM | POA: Diagnosis not present

## 2020-11-16 DIAGNOSIS — Z4932 Encounter for adequacy testing for peritoneal dialysis: Secondary | ICD-10-CM | POA: Diagnosis not present

## 2020-11-16 DIAGNOSIS — Z992 Dependence on renal dialysis: Secondary | ICD-10-CM | POA: Diagnosis not present

## 2020-11-16 DIAGNOSIS — N186 End stage renal disease: Secondary | ICD-10-CM | POA: Diagnosis not present

## 2020-11-16 DIAGNOSIS — N2581 Secondary hyperparathyroidism of renal origin: Secondary | ICD-10-CM | POA: Diagnosis not present

## 2020-11-16 DIAGNOSIS — Z23 Encounter for immunization: Secondary | ICD-10-CM | POA: Diagnosis not present

## 2020-11-16 DIAGNOSIS — D631 Anemia in chronic kidney disease: Secondary | ICD-10-CM | POA: Diagnosis not present

## 2020-11-17 DIAGNOSIS — N186 End stage renal disease: Secondary | ICD-10-CM | POA: Diagnosis not present

## 2020-11-17 DIAGNOSIS — N2581 Secondary hyperparathyroidism of renal origin: Secondary | ICD-10-CM | POA: Diagnosis not present

## 2020-11-17 DIAGNOSIS — Z992 Dependence on renal dialysis: Secondary | ICD-10-CM | POA: Diagnosis not present

## 2020-11-17 DIAGNOSIS — Z4932 Encounter for adequacy testing for peritoneal dialysis: Secondary | ICD-10-CM | POA: Diagnosis not present

## 2020-11-17 DIAGNOSIS — D631 Anemia in chronic kidney disease: Secondary | ICD-10-CM | POA: Diagnosis not present

## 2020-11-17 DIAGNOSIS — Z23 Encounter for immunization: Secondary | ICD-10-CM | POA: Diagnosis not present

## 2020-11-18 DIAGNOSIS — D631 Anemia in chronic kidney disease: Secondary | ICD-10-CM | POA: Diagnosis not present

## 2020-11-18 DIAGNOSIS — Z23 Encounter for immunization: Secondary | ICD-10-CM | POA: Diagnosis not present

## 2020-11-18 DIAGNOSIS — Z992 Dependence on renal dialysis: Secondary | ICD-10-CM | POA: Diagnosis not present

## 2020-11-18 DIAGNOSIS — N186 End stage renal disease: Secondary | ICD-10-CM | POA: Diagnosis not present

## 2020-11-18 DIAGNOSIS — N2581 Secondary hyperparathyroidism of renal origin: Secondary | ICD-10-CM | POA: Diagnosis not present

## 2020-11-18 DIAGNOSIS — Z4932 Encounter for adequacy testing for peritoneal dialysis: Secondary | ICD-10-CM | POA: Diagnosis not present

## 2020-11-19 DIAGNOSIS — Z4932 Encounter for adequacy testing for peritoneal dialysis: Secondary | ICD-10-CM | POA: Diagnosis not present

## 2020-11-19 DIAGNOSIS — Z992 Dependence on renal dialysis: Secondary | ICD-10-CM | POA: Diagnosis not present

## 2020-11-19 DIAGNOSIS — N186 End stage renal disease: Secondary | ICD-10-CM | POA: Diagnosis not present

## 2020-11-19 DIAGNOSIS — Z23 Encounter for immunization: Secondary | ICD-10-CM | POA: Diagnosis not present

## 2020-11-19 DIAGNOSIS — N2581 Secondary hyperparathyroidism of renal origin: Secondary | ICD-10-CM | POA: Diagnosis not present

## 2020-11-19 DIAGNOSIS — D631 Anemia in chronic kidney disease: Secondary | ICD-10-CM | POA: Diagnosis not present

## 2020-11-20 DIAGNOSIS — Z992 Dependence on renal dialysis: Secondary | ICD-10-CM | POA: Diagnosis not present

## 2020-11-20 DIAGNOSIS — Z23 Encounter for immunization: Secondary | ICD-10-CM | POA: Diagnosis not present

## 2020-11-20 DIAGNOSIS — Z4932 Encounter for adequacy testing for peritoneal dialysis: Secondary | ICD-10-CM | POA: Diagnosis not present

## 2020-11-20 DIAGNOSIS — D631 Anemia in chronic kidney disease: Secondary | ICD-10-CM | POA: Diagnosis not present

## 2020-11-20 DIAGNOSIS — N2581 Secondary hyperparathyroidism of renal origin: Secondary | ICD-10-CM | POA: Diagnosis not present

## 2020-11-20 DIAGNOSIS — N186 End stage renal disease: Secondary | ICD-10-CM | POA: Diagnosis not present

## 2020-11-21 DIAGNOSIS — Z4932 Encounter for adequacy testing for peritoneal dialysis: Secondary | ICD-10-CM | POA: Diagnosis not present

## 2020-11-21 DIAGNOSIS — Z23 Encounter for immunization: Secondary | ICD-10-CM | POA: Diagnosis not present

## 2020-11-21 DIAGNOSIS — N186 End stage renal disease: Secondary | ICD-10-CM | POA: Diagnosis not present

## 2020-11-21 DIAGNOSIS — N2581 Secondary hyperparathyroidism of renal origin: Secondary | ICD-10-CM | POA: Diagnosis not present

## 2020-11-21 DIAGNOSIS — Z992 Dependence on renal dialysis: Secondary | ICD-10-CM | POA: Diagnosis not present

## 2020-11-21 DIAGNOSIS — D631 Anemia in chronic kidney disease: Secondary | ICD-10-CM | POA: Diagnosis not present

## 2020-11-22 DIAGNOSIS — Z4932 Encounter for adequacy testing for peritoneal dialysis: Secondary | ICD-10-CM | POA: Diagnosis not present

## 2020-11-22 DIAGNOSIS — Z992 Dependence on renal dialysis: Secondary | ICD-10-CM | POA: Diagnosis not present

## 2020-11-22 DIAGNOSIS — D631 Anemia in chronic kidney disease: Secondary | ICD-10-CM | POA: Diagnosis not present

## 2020-11-22 DIAGNOSIS — N2581 Secondary hyperparathyroidism of renal origin: Secondary | ICD-10-CM | POA: Diagnosis not present

## 2020-11-22 DIAGNOSIS — N186 End stage renal disease: Secondary | ICD-10-CM | POA: Diagnosis not present

## 2020-11-22 DIAGNOSIS — Z23 Encounter for immunization: Secondary | ICD-10-CM | POA: Diagnosis not present

## 2020-11-23 DIAGNOSIS — Z23 Encounter for immunization: Secondary | ICD-10-CM | POA: Diagnosis not present

## 2020-11-23 DIAGNOSIS — D631 Anemia in chronic kidney disease: Secondary | ICD-10-CM | POA: Diagnosis not present

## 2020-11-23 DIAGNOSIS — Z4932 Encounter for adequacy testing for peritoneal dialysis: Secondary | ICD-10-CM | POA: Diagnosis not present

## 2020-11-23 DIAGNOSIS — N2581 Secondary hyperparathyroidism of renal origin: Secondary | ICD-10-CM | POA: Diagnosis not present

## 2020-11-23 DIAGNOSIS — Z992 Dependence on renal dialysis: Secondary | ICD-10-CM | POA: Diagnosis not present

## 2020-11-23 DIAGNOSIS — N186 End stage renal disease: Secondary | ICD-10-CM | POA: Diagnosis not present

## 2020-11-24 DIAGNOSIS — N186 End stage renal disease: Secondary | ICD-10-CM | POA: Diagnosis not present

## 2020-11-24 DIAGNOSIS — Z4932 Encounter for adequacy testing for peritoneal dialysis: Secondary | ICD-10-CM | POA: Diagnosis not present

## 2020-11-24 DIAGNOSIS — Z23 Encounter for immunization: Secondary | ICD-10-CM | POA: Diagnosis not present

## 2020-11-24 DIAGNOSIS — Z992 Dependence on renal dialysis: Secondary | ICD-10-CM | POA: Diagnosis not present

## 2020-11-24 DIAGNOSIS — N2581 Secondary hyperparathyroidism of renal origin: Secondary | ICD-10-CM | POA: Diagnosis not present

## 2020-11-24 DIAGNOSIS — D631 Anemia in chronic kidney disease: Secondary | ICD-10-CM | POA: Diagnosis not present

## 2020-11-25 DIAGNOSIS — Z23 Encounter for immunization: Secondary | ICD-10-CM | POA: Diagnosis not present

## 2020-11-25 DIAGNOSIS — D631 Anemia in chronic kidney disease: Secondary | ICD-10-CM | POA: Diagnosis not present

## 2020-11-25 DIAGNOSIS — N186 End stage renal disease: Secondary | ICD-10-CM | POA: Diagnosis not present

## 2020-11-25 DIAGNOSIS — N2581 Secondary hyperparathyroidism of renal origin: Secondary | ICD-10-CM | POA: Diagnosis not present

## 2020-11-25 DIAGNOSIS — Z4932 Encounter for adequacy testing for peritoneal dialysis: Secondary | ICD-10-CM | POA: Diagnosis not present

## 2020-11-25 DIAGNOSIS — Z992 Dependence on renal dialysis: Secondary | ICD-10-CM | POA: Diagnosis not present

## 2020-11-26 DIAGNOSIS — D631 Anemia in chronic kidney disease: Secondary | ICD-10-CM | POA: Diagnosis not present

## 2020-11-26 DIAGNOSIS — Z23 Encounter for immunization: Secondary | ICD-10-CM | POA: Diagnosis not present

## 2020-11-26 DIAGNOSIS — N186 End stage renal disease: Secondary | ICD-10-CM | POA: Diagnosis not present

## 2020-11-26 DIAGNOSIS — N2581 Secondary hyperparathyroidism of renal origin: Secondary | ICD-10-CM | POA: Diagnosis not present

## 2020-11-26 DIAGNOSIS — Z4932 Encounter for adequacy testing for peritoneal dialysis: Secondary | ICD-10-CM | POA: Diagnosis not present

## 2020-11-26 DIAGNOSIS — Z992 Dependence on renal dialysis: Secondary | ICD-10-CM | POA: Diagnosis not present

## 2020-11-27 DIAGNOSIS — N186 End stage renal disease: Secondary | ICD-10-CM | POA: Diagnosis not present

## 2020-11-27 DIAGNOSIS — Z992 Dependence on renal dialysis: Secondary | ICD-10-CM | POA: Diagnosis not present

## 2020-11-27 DIAGNOSIS — Z4932 Encounter for adequacy testing for peritoneal dialysis: Secondary | ICD-10-CM | POA: Diagnosis not present

## 2020-11-27 DIAGNOSIS — N2581 Secondary hyperparathyroidism of renal origin: Secondary | ICD-10-CM | POA: Diagnosis not present

## 2020-11-27 DIAGNOSIS — D631 Anemia in chronic kidney disease: Secondary | ICD-10-CM | POA: Diagnosis not present

## 2020-11-27 DIAGNOSIS — Z23 Encounter for immunization: Secondary | ICD-10-CM | POA: Diagnosis not present

## 2020-11-28 DIAGNOSIS — Z4932 Encounter for adequacy testing for peritoneal dialysis: Secondary | ICD-10-CM | POA: Diagnosis not present

## 2020-11-28 DIAGNOSIS — N186 End stage renal disease: Secondary | ICD-10-CM | POA: Diagnosis not present

## 2020-11-28 DIAGNOSIS — Z992 Dependence on renal dialysis: Secondary | ICD-10-CM | POA: Diagnosis not present

## 2020-11-28 DIAGNOSIS — Z23 Encounter for immunization: Secondary | ICD-10-CM | POA: Diagnosis not present

## 2020-11-28 DIAGNOSIS — N2581 Secondary hyperparathyroidism of renal origin: Secondary | ICD-10-CM | POA: Diagnosis not present

## 2020-11-28 DIAGNOSIS — D631 Anemia in chronic kidney disease: Secondary | ICD-10-CM | POA: Diagnosis not present

## 2020-11-29 DIAGNOSIS — N2581 Secondary hyperparathyroidism of renal origin: Secondary | ICD-10-CM | POA: Diagnosis not present

## 2020-11-29 DIAGNOSIS — N186 End stage renal disease: Secondary | ICD-10-CM | POA: Diagnosis not present

## 2020-11-29 DIAGNOSIS — Z992 Dependence on renal dialysis: Secondary | ICD-10-CM | POA: Diagnosis not present

## 2020-11-29 DIAGNOSIS — Z23 Encounter for immunization: Secondary | ICD-10-CM | POA: Diagnosis not present

## 2020-11-29 DIAGNOSIS — Z4932 Encounter for adequacy testing for peritoneal dialysis: Secondary | ICD-10-CM | POA: Diagnosis not present

## 2020-11-29 DIAGNOSIS — D631 Anemia in chronic kidney disease: Secondary | ICD-10-CM | POA: Diagnosis not present

## 2020-11-30 DIAGNOSIS — N2581 Secondary hyperparathyroidism of renal origin: Secondary | ICD-10-CM | POA: Diagnosis not present

## 2020-11-30 DIAGNOSIS — Z992 Dependence on renal dialysis: Secondary | ICD-10-CM | POA: Diagnosis not present

## 2020-11-30 DIAGNOSIS — Z23 Encounter for immunization: Secondary | ICD-10-CM | POA: Diagnosis not present

## 2020-11-30 DIAGNOSIS — D631 Anemia in chronic kidney disease: Secondary | ICD-10-CM | POA: Diagnosis not present

## 2020-11-30 DIAGNOSIS — Z4932 Encounter for adequacy testing for peritoneal dialysis: Secondary | ICD-10-CM | POA: Diagnosis not present

## 2020-11-30 DIAGNOSIS — N186 End stage renal disease: Secondary | ICD-10-CM | POA: Diagnosis not present

## 2020-12-01 DIAGNOSIS — Z4932 Encounter for adequacy testing for peritoneal dialysis: Secondary | ICD-10-CM | POA: Diagnosis not present

## 2020-12-01 DIAGNOSIS — D631 Anemia in chronic kidney disease: Secondary | ICD-10-CM | POA: Diagnosis not present

## 2020-12-01 DIAGNOSIS — Z992 Dependence on renal dialysis: Secondary | ICD-10-CM | POA: Diagnosis not present

## 2020-12-01 DIAGNOSIS — N2581 Secondary hyperparathyroidism of renal origin: Secondary | ICD-10-CM | POA: Diagnosis not present

## 2020-12-01 DIAGNOSIS — Z23 Encounter for immunization: Secondary | ICD-10-CM | POA: Diagnosis not present

## 2020-12-01 DIAGNOSIS — N186 End stage renal disease: Secondary | ICD-10-CM | POA: Diagnosis not present

## 2020-12-02 DIAGNOSIS — D631 Anemia in chronic kidney disease: Secondary | ICD-10-CM | POA: Diagnosis not present

## 2020-12-02 DIAGNOSIS — Z992 Dependence on renal dialysis: Secondary | ICD-10-CM | POA: Diagnosis not present

## 2020-12-02 DIAGNOSIS — N186 End stage renal disease: Secondary | ICD-10-CM | POA: Diagnosis not present

## 2020-12-02 DIAGNOSIS — Z23 Encounter for immunization: Secondary | ICD-10-CM | POA: Diagnosis not present

## 2020-12-02 DIAGNOSIS — N2581 Secondary hyperparathyroidism of renal origin: Secondary | ICD-10-CM | POA: Diagnosis not present

## 2020-12-02 DIAGNOSIS — Z4932 Encounter for adequacy testing for peritoneal dialysis: Secondary | ICD-10-CM | POA: Diagnosis not present

## 2020-12-03 DIAGNOSIS — D631 Anemia in chronic kidney disease: Secondary | ICD-10-CM | POA: Diagnosis not present

## 2020-12-03 DIAGNOSIS — Z4932 Encounter for adequacy testing for peritoneal dialysis: Secondary | ICD-10-CM | POA: Diagnosis not present

## 2020-12-03 DIAGNOSIS — Z23 Encounter for immunization: Secondary | ICD-10-CM | POA: Diagnosis not present

## 2020-12-03 DIAGNOSIS — Z992 Dependence on renal dialysis: Secondary | ICD-10-CM | POA: Diagnosis not present

## 2020-12-03 DIAGNOSIS — N186 End stage renal disease: Secondary | ICD-10-CM | POA: Diagnosis not present

## 2020-12-03 DIAGNOSIS — N2581 Secondary hyperparathyroidism of renal origin: Secondary | ICD-10-CM | POA: Diagnosis not present

## 2020-12-04 DIAGNOSIS — N186 End stage renal disease: Secondary | ICD-10-CM | POA: Diagnosis not present

## 2020-12-04 DIAGNOSIS — Z4932 Encounter for adequacy testing for peritoneal dialysis: Secondary | ICD-10-CM | POA: Diagnosis not present

## 2020-12-04 DIAGNOSIS — N2581 Secondary hyperparathyroidism of renal origin: Secondary | ICD-10-CM | POA: Diagnosis not present

## 2020-12-04 DIAGNOSIS — D631 Anemia in chronic kidney disease: Secondary | ICD-10-CM | POA: Diagnosis not present

## 2020-12-04 DIAGNOSIS — Z992 Dependence on renal dialysis: Secondary | ICD-10-CM | POA: Diagnosis not present

## 2020-12-04 DIAGNOSIS — Z23 Encounter for immunization: Secondary | ICD-10-CM | POA: Diagnosis not present

## 2020-12-05 DIAGNOSIS — N186 End stage renal disease: Secondary | ICD-10-CM | POA: Diagnosis not present

## 2020-12-05 DIAGNOSIS — N2581 Secondary hyperparathyroidism of renal origin: Secondary | ICD-10-CM | POA: Diagnosis not present

## 2020-12-05 DIAGNOSIS — Z4932 Encounter for adequacy testing for peritoneal dialysis: Secondary | ICD-10-CM | POA: Diagnosis not present

## 2020-12-05 DIAGNOSIS — Z23 Encounter for immunization: Secondary | ICD-10-CM | POA: Diagnosis not present

## 2020-12-05 DIAGNOSIS — Z992 Dependence on renal dialysis: Secondary | ICD-10-CM | POA: Diagnosis not present

## 2020-12-05 DIAGNOSIS — D631 Anemia in chronic kidney disease: Secondary | ICD-10-CM | POA: Diagnosis not present

## 2020-12-06 DIAGNOSIS — Z992 Dependence on renal dialysis: Secondary | ICD-10-CM | POA: Diagnosis not present

## 2020-12-06 DIAGNOSIS — N2581 Secondary hyperparathyroidism of renal origin: Secondary | ICD-10-CM | POA: Diagnosis not present

## 2020-12-06 DIAGNOSIS — Z23 Encounter for immunization: Secondary | ICD-10-CM | POA: Diagnosis not present

## 2020-12-06 DIAGNOSIS — Z4932 Encounter for adequacy testing for peritoneal dialysis: Secondary | ICD-10-CM | POA: Diagnosis not present

## 2020-12-06 DIAGNOSIS — D631 Anemia in chronic kidney disease: Secondary | ICD-10-CM | POA: Diagnosis not present

## 2020-12-06 DIAGNOSIS — N186 End stage renal disease: Secondary | ICD-10-CM | POA: Diagnosis not present

## 2020-12-07 DIAGNOSIS — N2581 Secondary hyperparathyroidism of renal origin: Secondary | ICD-10-CM | POA: Diagnosis not present

## 2020-12-07 DIAGNOSIS — N186 End stage renal disease: Secondary | ICD-10-CM | POA: Diagnosis not present

## 2020-12-07 DIAGNOSIS — D631 Anemia in chronic kidney disease: Secondary | ICD-10-CM | POA: Diagnosis not present

## 2020-12-07 DIAGNOSIS — Z992 Dependence on renal dialysis: Secondary | ICD-10-CM | POA: Diagnosis not present

## 2020-12-07 DIAGNOSIS — Z23 Encounter for immunization: Secondary | ICD-10-CM | POA: Diagnosis not present

## 2020-12-07 DIAGNOSIS — Z4932 Encounter for adequacy testing for peritoneal dialysis: Secondary | ICD-10-CM | POA: Diagnosis not present

## 2020-12-08 DIAGNOSIS — Z992 Dependence on renal dialysis: Secondary | ICD-10-CM | POA: Diagnosis not present

## 2020-12-08 DIAGNOSIS — Z23 Encounter for immunization: Secondary | ICD-10-CM | POA: Diagnosis not present

## 2020-12-08 DIAGNOSIS — D631 Anemia in chronic kidney disease: Secondary | ICD-10-CM | POA: Diagnosis not present

## 2020-12-08 DIAGNOSIS — N2581 Secondary hyperparathyroidism of renal origin: Secondary | ICD-10-CM | POA: Diagnosis not present

## 2020-12-08 DIAGNOSIS — N186 End stage renal disease: Secondary | ICD-10-CM | POA: Diagnosis not present

## 2020-12-08 DIAGNOSIS — Z4932 Encounter for adequacy testing for peritoneal dialysis: Secondary | ICD-10-CM | POA: Diagnosis not present

## 2020-12-09 DIAGNOSIS — Z23 Encounter for immunization: Secondary | ICD-10-CM | POA: Diagnosis not present

## 2020-12-09 DIAGNOSIS — D631 Anemia in chronic kidney disease: Secondary | ICD-10-CM | POA: Diagnosis not present

## 2020-12-09 DIAGNOSIS — N186 End stage renal disease: Secondary | ICD-10-CM | POA: Diagnosis not present

## 2020-12-09 DIAGNOSIS — N2581 Secondary hyperparathyroidism of renal origin: Secondary | ICD-10-CM | POA: Diagnosis not present

## 2020-12-09 DIAGNOSIS — Z992 Dependence on renal dialysis: Secondary | ICD-10-CM | POA: Diagnosis not present

## 2020-12-09 DIAGNOSIS — Z4932 Encounter for adequacy testing for peritoneal dialysis: Secondary | ICD-10-CM | POA: Diagnosis not present

## 2020-12-10 DIAGNOSIS — N2581 Secondary hyperparathyroidism of renal origin: Secondary | ICD-10-CM | POA: Diagnosis not present

## 2020-12-10 DIAGNOSIS — Z992 Dependence on renal dialysis: Secondary | ICD-10-CM | POA: Diagnosis not present

## 2020-12-10 DIAGNOSIS — Z23 Encounter for immunization: Secondary | ICD-10-CM | POA: Diagnosis not present

## 2020-12-10 DIAGNOSIS — N186 End stage renal disease: Secondary | ICD-10-CM | POA: Diagnosis not present

## 2020-12-10 DIAGNOSIS — D631 Anemia in chronic kidney disease: Secondary | ICD-10-CM | POA: Diagnosis not present

## 2020-12-10 DIAGNOSIS — Z4932 Encounter for adequacy testing for peritoneal dialysis: Secondary | ICD-10-CM | POA: Diagnosis not present

## 2020-12-11 DIAGNOSIS — N186 End stage renal disease: Secondary | ICD-10-CM | POA: Diagnosis not present

## 2020-12-11 DIAGNOSIS — N2581 Secondary hyperparathyroidism of renal origin: Secondary | ICD-10-CM | POA: Diagnosis not present

## 2020-12-11 DIAGNOSIS — Z4932 Encounter for adequacy testing for peritoneal dialysis: Secondary | ICD-10-CM | POA: Diagnosis not present

## 2020-12-11 DIAGNOSIS — Z23 Encounter for immunization: Secondary | ICD-10-CM | POA: Diagnosis not present

## 2020-12-11 DIAGNOSIS — Z992 Dependence on renal dialysis: Secondary | ICD-10-CM | POA: Diagnosis not present

## 2020-12-11 DIAGNOSIS — D631 Anemia in chronic kidney disease: Secondary | ICD-10-CM | POA: Diagnosis not present

## 2020-12-12 DIAGNOSIS — Z4932 Encounter for adequacy testing for peritoneal dialysis: Secondary | ICD-10-CM | POA: Diagnosis not present

## 2020-12-12 DIAGNOSIS — N186 End stage renal disease: Secondary | ICD-10-CM | POA: Diagnosis not present

## 2020-12-12 DIAGNOSIS — Z992 Dependence on renal dialysis: Secondary | ICD-10-CM | POA: Diagnosis not present

## 2020-12-12 DIAGNOSIS — I129 Hypertensive chronic kidney disease with stage 1 through stage 4 chronic kidney disease, or unspecified chronic kidney disease: Secondary | ICD-10-CM | POA: Diagnosis not present

## 2020-12-12 DIAGNOSIS — N2581 Secondary hyperparathyroidism of renal origin: Secondary | ICD-10-CM | POA: Diagnosis not present

## 2020-12-12 DIAGNOSIS — Z23 Encounter for immunization: Secondary | ICD-10-CM | POA: Diagnosis not present

## 2020-12-12 DIAGNOSIS — D631 Anemia in chronic kidney disease: Secondary | ICD-10-CM | POA: Diagnosis not present

## 2020-12-13 DIAGNOSIS — Z992 Dependence on renal dialysis: Secondary | ICD-10-CM | POA: Diagnosis not present

## 2020-12-13 DIAGNOSIS — Z4932 Encounter for adequacy testing for peritoneal dialysis: Secondary | ICD-10-CM | POA: Diagnosis not present

## 2020-12-13 DIAGNOSIS — N2581 Secondary hyperparathyroidism of renal origin: Secondary | ICD-10-CM | POA: Diagnosis not present

## 2020-12-13 DIAGNOSIS — N186 End stage renal disease: Secondary | ICD-10-CM | POA: Diagnosis not present

## 2020-12-14 DIAGNOSIS — N2581 Secondary hyperparathyroidism of renal origin: Secondary | ICD-10-CM | POA: Diagnosis not present

## 2020-12-14 DIAGNOSIS — Z992 Dependence on renal dialysis: Secondary | ICD-10-CM | POA: Diagnosis not present

## 2020-12-14 DIAGNOSIS — N186 End stage renal disease: Secondary | ICD-10-CM | POA: Diagnosis not present

## 2020-12-14 DIAGNOSIS — Z4932 Encounter for adequacy testing for peritoneal dialysis: Secondary | ICD-10-CM | POA: Diagnosis not present

## 2020-12-15 DIAGNOSIS — Z4932 Encounter for adequacy testing for peritoneal dialysis: Secondary | ICD-10-CM | POA: Diagnosis not present

## 2020-12-15 DIAGNOSIS — Z992 Dependence on renal dialysis: Secondary | ICD-10-CM | POA: Diagnosis not present

## 2020-12-15 DIAGNOSIS — N186 End stage renal disease: Secondary | ICD-10-CM | POA: Diagnosis not present

## 2020-12-15 DIAGNOSIS — N2581 Secondary hyperparathyroidism of renal origin: Secondary | ICD-10-CM | POA: Diagnosis not present

## 2020-12-16 DIAGNOSIS — Z4932 Encounter for adequacy testing for peritoneal dialysis: Secondary | ICD-10-CM | POA: Diagnosis not present

## 2020-12-16 DIAGNOSIS — N2581 Secondary hyperparathyroidism of renal origin: Secondary | ICD-10-CM | POA: Diagnosis not present

## 2020-12-16 DIAGNOSIS — N186 End stage renal disease: Secondary | ICD-10-CM | POA: Diagnosis not present

## 2020-12-16 DIAGNOSIS — Z992 Dependence on renal dialysis: Secondary | ICD-10-CM | POA: Diagnosis not present

## 2020-12-17 DIAGNOSIS — Z992 Dependence on renal dialysis: Secondary | ICD-10-CM | POA: Diagnosis not present

## 2020-12-17 DIAGNOSIS — N2581 Secondary hyperparathyroidism of renal origin: Secondary | ICD-10-CM | POA: Diagnosis not present

## 2020-12-17 DIAGNOSIS — Z4932 Encounter for adequacy testing for peritoneal dialysis: Secondary | ICD-10-CM | POA: Diagnosis not present

## 2020-12-17 DIAGNOSIS — N186 End stage renal disease: Secondary | ICD-10-CM | POA: Diagnosis not present

## 2020-12-18 DIAGNOSIS — Z4932 Encounter for adequacy testing for peritoneal dialysis: Secondary | ICD-10-CM | POA: Diagnosis not present

## 2020-12-18 DIAGNOSIS — Z992 Dependence on renal dialysis: Secondary | ICD-10-CM | POA: Diagnosis not present

## 2020-12-18 DIAGNOSIS — N2581 Secondary hyperparathyroidism of renal origin: Secondary | ICD-10-CM | POA: Diagnosis not present

## 2020-12-18 DIAGNOSIS — N186 End stage renal disease: Secondary | ICD-10-CM | POA: Diagnosis not present

## 2020-12-19 DIAGNOSIS — Z4932 Encounter for adequacy testing for peritoneal dialysis: Secondary | ICD-10-CM | POA: Diagnosis not present

## 2020-12-19 DIAGNOSIS — N2581 Secondary hyperparathyroidism of renal origin: Secondary | ICD-10-CM | POA: Diagnosis not present

## 2020-12-19 DIAGNOSIS — N186 End stage renal disease: Secondary | ICD-10-CM | POA: Diagnosis not present

## 2020-12-19 DIAGNOSIS — Z992 Dependence on renal dialysis: Secondary | ICD-10-CM | POA: Diagnosis not present

## 2020-12-20 DIAGNOSIS — N186 End stage renal disease: Secondary | ICD-10-CM | POA: Diagnosis not present

## 2020-12-20 DIAGNOSIS — N2581 Secondary hyperparathyroidism of renal origin: Secondary | ICD-10-CM | POA: Diagnosis not present

## 2020-12-20 DIAGNOSIS — Z4932 Encounter for adequacy testing for peritoneal dialysis: Secondary | ICD-10-CM | POA: Diagnosis not present

## 2020-12-20 DIAGNOSIS — Z992 Dependence on renal dialysis: Secondary | ICD-10-CM | POA: Diagnosis not present

## 2020-12-21 DIAGNOSIS — Z992 Dependence on renal dialysis: Secondary | ICD-10-CM | POA: Diagnosis not present

## 2020-12-21 DIAGNOSIS — N186 End stage renal disease: Secondary | ICD-10-CM | POA: Diagnosis not present

## 2020-12-21 DIAGNOSIS — N2581 Secondary hyperparathyroidism of renal origin: Secondary | ICD-10-CM | POA: Diagnosis not present

## 2020-12-21 DIAGNOSIS — Z4932 Encounter for adequacy testing for peritoneal dialysis: Secondary | ICD-10-CM | POA: Diagnosis not present

## 2020-12-22 DIAGNOSIS — Z4932 Encounter for adequacy testing for peritoneal dialysis: Secondary | ICD-10-CM | POA: Diagnosis not present

## 2020-12-22 DIAGNOSIS — Z992 Dependence on renal dialysis: Secondary | ICD-10-CM | POA: Diagnosis not present

## 2020-12-22 DIAGNOSIS — N186 End stage renal disease: Secondary | ICD-10-CM | POA: Diagnosis not present

## 2020-12-22 DIAGNOSIS — N2581 Secondary hyperparathyroidism of renal origin: Secondary | ICD-10-CM | POA: Diagnosis not present

## 2020-12-23 DIAGNOSIS — N2581 Secondary hyperparathyroidism of renal origin: Secondary | ICD-10-CM | POA: Diagnosis not present

## 2020-12-23 DIAGNOSIS — Z992 Dependence on renal dialysis: Secondary | ICD-10-CM | POA: Diagnosis not present

## 2020-12-23 DIAGNOSIS — Z4932 Encounter for adequacy testing for peritoneal dialysis: Secondary | ICD-10-CM | POA: Diagnosis not present

## 2020-12-23 DIAGNOSIS — N186 End stage renal disease: Secondary | ICD-10-CM | POA: Diagnosis not present

## 2020-12-24 DIAGNOSIS — N2581 Secondary hyperparathyroidism of renal origin: Secondary | ICD-10-CM | POA: Diagnosis not present

## 2020-12-24 DIAGNOSIS — Z4932 Encounter for adequacy testing for peritoneal dialysis: Secondary | ICD-10-CM | POA: Diagnosis not present

## 2020-12-24 DIAGNOSIS — N186 End stage renal disease: Secondary | ICD-10-CM | POA: Diagnosis not present

## 2020-12-24 DIAGNOSIS — Z992 Dependence on renal dialysis: Secondary | ICD-10-CM | POA: Diagnosis not present

## 2020-12-25 DIAGNOSIS — Z4932 Encounter for adequacy testing for peritoneal dialysis: Secondary | ICD-10-CM | POA: Diagnosis not present

## 2020-12-25 DIAGNOSIS — N186 End stage renal disease: Secondary | ICD-10-CM | POA: Diagnosis not present

## 2020-12-25 DIAGNOSIS — N2581 Secondary hyperparathyroidism of renal origin: Secondary | ICD-10-CM | POA: Diagnosis not present

## 2020-12-25 DIAGNOSIS — Z992 Dependence on renal dialysis: Secondary | ICD-10-CM | POA: Diagnosis not present

## 2020-12-26 DIAGNOSIS — N2581 Secondary hyperparathyroidism of renal origin: Secondary | ICD-10-CM | POA: Diagnosis not present

## 2020-12-26 DIAGNOSIS — Z992 Dependence on renal dialysis: Secondary | ICD-10-CM | POA: Diagnosis not present

## 2020-12-26 DIAGNOSIS — N186 End stage renal disease: Secondary | ICD-10-CM | POA: Diagnosis not present

## 2020-12-26 DIAGNOSIS — Z4932 Encounter for adequacy testing for peritoneal dialysis: Secondary | ICD-10-CM | POA: Diagnosis not present

## 2020-12-27 DIAGNOSIS — Z992 Dependence on renal dialysis: Secondary | ICD-10-CM | POA: Diagnosis not present

## 2020-12-27 DIAGNOSIS — N186 End stage renal disease: Secondary | ICD-10-CM | POA: Diagnosis not present

## 2020-12-27 DIAGNOSIS — N2581 Secondary hyperparathyroidism of renal origin: Secondary | ICD-10-CM | POA: Diagnosis not present

## 2020-12-27 DIAGNOSIS — Z4932 Encounter for adequacy testing for peritoneal dialysis: Secondary | ICD-10-CM | POA: Diagnosis not present

## 2020-12-28 DIAGNOSIS — Z4932 Encounter for adequacy testing for peritoneal dialysis: Secondary | ICD-10-CM | POA: Diagnosis not present

## 2020-12-28 DIAGNOSIS — Z992 Dependence on renal dialysis: Secondary | ICD-10-CM | POA: Diagnosis not present

## 2020-12-28 DIAGNOSIS — N2581 Secondary hyperparathyroidism of renal origin: Secondary | ICD-10-CM | POA: Diagnosis not present

## 2020-12-28 DIAGNOSIS — N186 End stage renal disease: Secondary | ICD-10-CM | POA: Diagnosis not present

## 2020-12-29 DIAGNOSIS — Z4932 Encounter for adequacy testing for peritoneal dialysis: Secondary | ICD-10-CM | POA: Diagnosis not present

## 2020-12-29 DIAGNOSIS — N186 End stage renal disease: Secondary | ICD-10-CM | POA: Diagnosis not present

## 2020-12-29 DIAGNOSIS — Z992 Dependence on renal dialysis: Secondary | ICD-10-CM | POA: Diagnosis not present

## 2020-12-29 DIAGNOSIS — N2581 Secondary hyperparathyroidism of renal origin: Secondary | ICD-10-CM | POA: Diagnosis not present

## 2020-12-30 DIAGNOSIS — Z992 Dependence on renal dialysis: Secondary | ICD-10-CM | POA: Diagnosis not present

## 2020-12-30 DIAGNOSIS — N186 End stage renal disease: Secondary | ICD-10-CM | POA: Diagnosis not present

## 2020-12-30 DIAGNOSIS — Z4932 Encounter for adequacy testing for peritoneal dialysis: Secondary | ICD-10-CM | POA: Diagnosis not present

## 2020-12-30 DIAGNOSIS — N2581 Secondary hyperparathyroidism of renal origin: Secondary | ICD-10-CM | POA: Diagnosis not present

## 2020-12-31 DIAGNOSIS — Z4932 Encounter for adequacy testing for peritoneal dialysis: Secondary | ICD-10-CM | POA: Diagnosis not present

## 2020-12-31 DIAGNOSIS — N2581 Secondary hyperparathyroidism of renal origin: Secondary | ICD-10-CM | POA: Diagnosis not present

## 2020-12-31 DIAGNOSIS — Z992 Dependence on renal dialysis: Secondary | ICD-10-CM | POA: Diagnosis not present

## 2020-12-31 DIAGNOSIS — N186 End stage renal disease: Secondary | ICD-10-CM | POA: Diagnosis not present

## 2021-01-01 DIAGNOSIS — N2581 Secondary hyperparathyroidism of renal origin: Secondary | ICD-10-CM | POA: Diagnosis not present

## 2021-01-01 DIAGNOSIS — Z4932 Encounter for adequacy testing for peritoneal dialysis: Secondary | ICD-10-CM | POA: Diagnosis not present

## 2021-01-01 DIAGNOSIS — Z992 Dependence on renal dialysis: Secondary | ICD-10-CM | POA: Diagnosis not present

## 2021-01-01 DIAGNOSIS — N186 End stage renal disease: Secondary | ICD-10-CM | POA: Diagnosis not present

## 2021-01-02 DIAGNOSIS — N186 End stage renal disease: Secondary | ICD-10-CM | POA: Diagnosis not present

## 2021-01-02 DIAGNOSIS — N2581 Secondary hyperparathyroidism of renal origin: Secondary | ICD-10-CM | POA: Diagnosis not present

## 2021-01-02 DIAGNOSIS — Z992 Dependence on renal dialysis: Secondary | ICD-10-CM | POA: Diagnosis not present

## 2021-01-02 DIAGNOSIS — Z4932 Encounter for adequacy testing for peritoneal dialysis: Secondary | ICD-10-CM | POA: Diagnosis not present

## 2021-01-03 DIAGNOSIS — Z992 Dependence on renal dialysis: Secondary | ICD-10-CM | POA: Diagnosis not present

## 2021-01-03 DIAGNOSIS — N186 End stage renal disease: Secondary | ICD-10-CM | POA: Diagnosis not present

## 2021-01-03 DIAGNOSIS — N2581 Secondary hyperparathyroidism of renal origin: Secondary | ICD-10-CM | POA: Diagnosis not present

## 2021-01-03 DIAGNOSIS — Z4932 Encounter for adequacy testing for peritoneal dialysis: Secondary | ICD-10-CM | POA: Diagnosis not present

## 2021-01-04 DIAGNOSIS — N186 End stage renal disease: Secondary | ICD-10-CM | POA: Diagnosis not present

## 2021-01-04 DIAGNOSIS — N2581 Secondary hyperparathyroidism of renal origin: Secondary | ICD-10-CM | POA: Diagnosis not present

## 2021-01-04 DIAGNOSIS — Z4932 Encounter for adequacy testing for peritoneal dialysis: Secondary | ICD-10-CM | POA: Diagnosis not present

## 2021-01-04 DIAGNOSIS — Z992 Dependence on renal dialysis: Secondary | ICD-10-CM | POA: Diagnosis not present

## 2021-01-05 DIAGNOSIS — N2581 Secondary hyperparathyroidism of renal origin: Secondary | ICD-10-CM | POA: Diagnosis not present

## 2021-01-05 DIAGNOSIS — N186 End stage renal disease: Secondary | ICD-10-CM | POA: Diagnosis not present

## 2021-01-05 DIAGNOSIS — Z4932 Encounter for adequacy testing for peritoneal dialysis: Secondary | ICD-10-CM | POA: Diagnosis not present

## 2021-01-05 DIAGNOSIS — Z992 Dependence on renal dialysis: Secondary | ICD-10-CM | POA: Diagnosis not present

## 2021-01-06 DIAGNOSIS — Z4932 Encounter for adequacy testing for peritoneal dialysis: Secondary | ICD-10-CM | POA: Diagnosis not present

## 2021-01-06 DIAGNOSIS — Z992 Dependence on renal dialysis: Secondary | ICD-10-CM | POA: Diagnosis not present

## 2021-01-06 DIAGNOSIS — N2581 Secondary hyperparathyroidism of renal origin: Secondary | ICD-10-CM | POA: Diagnosis not present

## 2021-01-06 DIAGNOSIS — N186 End stage renal disease: Secondary | ICD-10-CM | POA: Diagnosis not present

## 2021-01-07 DIAGNOSIS — N186 End stage renal disease: Secondary | ICD-10-CM | POA: Diagnosis not present

## 2021-01-07 DIAGNOSIS — Z4932 Encounter for adequacy testing for peritoneal dialysis: Secondary | ICD-10-CM | POA: Diagnosis not present

## 2021-01-07 DIAGNOSIS — N2581 Secondary hyperparathyroidism of renal origin: Secondary | ICD-10-CM | POA: Diagnosis not present

## 2021-01-07 DIAGNOSIS — Z992 Dependence on renal dialysis: Secondary | ICD-10-CM | POA: Diagnosis not present

## 2021-01-08 DIAGNOSIS — Z992 Dependence on renal dialysis: Secondary | ICD-10-CM | POA: Diagnosis not present

## 2021-01-08 DIAGNOSIS — Z4932 Encounter for adequacy testing for peritoneal dialysis: Secondary | ICD-10-CM | POA: Diagnosis not present

## 2021-01-08 DIAGNOSIS — N2581 Secondary hyperparathyroidism of renal origin: Secondary | ICD-10-CM | POA: Diagnosis not present

## 2021-01-08 DIAGNOSIS — N186 End stage renal disease: Secondary | ICD-10-CM | POA: Diagnosis not present

## 2021-01-09 DIAGNOSIS — N2581 Secondary hyperparathyroidism of renal origin: Secondary | ICD-10-CM | POA: Diagnosis not present

## 2021-01-09 DIAGNOSIS — Z992 Dependence on renal dialysis: Secondary | ICD-10-CM | POA: Diagnosis not present

## 2021-01-09 DIAGNOSIS — Z4932 Encounter for adequacy testing for peritoneal dialysis: Secondary | ICD-10-CM | POA: Diagnosis not present

## 2021-01-09 DIAGNOSIS — N186 End stage renal disease: Secondary | ICD-10-CM | POA: Diagnosis not present

## 2021-01-09 DIAGNOSIS — I129 Hypertensive chronic kidney disease with stage 1 through stage 4 chronic kidney disease, or unspecified chronic kidney disease: Secondary | ICD-10-CM | POA: Diagnosis not present

## 2021-01-10 DIAGNOSIS — N2581 Secondary hyperparathyroidism of renal origin: Secondary | ICD-10-CM | POA: Diagnosis not present

## 2021-01-10 DIAGNOSIS — D631 Anemia in chronic kidney disease: Secondary | ICD-10-CM | POA: Diagnosis not present

## 2021-01-10 DIAGNOSIS — N186 End stage renal disease: Secondary | ICD-10-CM | POA: Diagnosis not present

## 2021-01-10 DIAGNOSIS — Z4932 Encounter for adequacy testing for peritoneal dialysis: Secondary | ICD-10-CM | POA: Diagnosis not present

## 2021-01-10 DIAGNOSIS — Z992 Dependence on renal dialysis: Secondary | ICD-10-CM | POA: Diagnosis not present

## 2021-01-11 DIAGNOSIS — N186 End stage renal disease: Secondary | ICD-10-CM | POA: Diagnosis not present

## 2021-01-11 DIAGNOSIS — D631 Anemia in chronic kidney disease: Secondary | ICD-10-CM | POA: Diagnosis not present

## 2021-01-11 DIAGNOSIS — Z992 Dependence on renal dialysis: Secondary | ICD-10-CM | POA: Diagnosis not present

## 2021-01-11 DIAGNOSIS — Z4932 Encounter for adequacy testing for peritoneal dialysis: Secondary | ICD-10-CM | POA: Diagnosis not present

## 2021-01-11 DIAGNOSIS — N2581 Secondary hyperparathyroidism of renal origin: Secondary | ICD-10-CM | POA: Diagnosis not present

## 2021-01-12 DIAGNOSIS — N2581 Secondary hyperparathyroidism of renal origin: Secondary | ICD-10-CM | POA: Diagnosis not present

## 2021-01-12 DIAGNOSIS — Z992 Dependence on renal dialysis: Secondary | ICD-10-CM | POA: Diagnosis not present

## 2021-01-12 DIAGNOSIS — D631 Anemia in chronic kidney disease: Secondary | ICD-10-CM | POA: Diagnosis not present

## 2021-01-12 DIAGNOSIS — Z4932 Encounter for adequacy testing for peritoneal dialysis: Secondary | ICD-10-CM | POA: Diagnosis not present

## 2021-01-12 DIAGNOSIS — N186 End stage renal disease: Secondary | ICD-10-CM | POA: Diagnosis not present

## 2021-01-13 DIAGNOSIS — Z4932 Encounter for adequacy testing for peritoneal dialysis: Secondary | ICD-10-CM | POA: Diagnosis not present

## 2021-01-13 DIAGNOSIS — N2581 Secondary hyperparathyroidism of renal origin: Secondary | ICD-10-CM | POA: Diagnosis not present

## 2021-01-13 DIAGNOSIS — Z992 Dependence on renal dialysis: Secondary | ICD-10-CM | POA: Diagnosis not present

## 2021-01-13 DIAGNOSIS — D631 Anemia in chronic kidney disease: Secondary | ICD-10-CM | POA: Diagnosis not present

## 2021-01-13 DIAGNOSIS — N186 End stage renal disease: Secondary | ICD-10-CM | POA: Diagnosis not present

## 2021-01-13 DIAGNOSIS — Z0000 Encounter for general adult medical examination without abnormal findings: Secondary | ICD-10-CM | POA: Diagnosis not present

## 2021-01-14 DIAGNOSIS — D631 Anemia in chronic kidney disease: Secondary | ICD-10-CM | POA: Diagnosis not present

## 2021-01-14 DIAGNOSIS — Z992 Dependence on renal dialysis: Secondary | ICD-10-CM | POA: Diagnosis not present

## 2021-01-14 DIAGNOSIS — N2581 Secondary hyperparathyroidism of renal origin: Secondary | ICD-10-CM | POA: Diagnosis not present

## 2021-01-14 DIAGNOSIS — N186 End stage renal disease: Secondary | ICD-10-CM | POA: Diagnosis not present

## 2021-01-14 DIAGNOSIS — Z4932 Encounter for adequacy testing for peritoneal dialysis: Secondary | ICD-10-CM | POA: Diagnosis not present

## 2021-01-15 DIAGNOSIS — Z4932 Encounter for adequacy testing for peritoneal dialysis: Secondary | ICD-10-CM | POA: Diagnosis not present

## 2021-01-15 DIAGNOSIS — N2581 Secondary hyperparathyroidism of renal origin: Secondary | ICD-10-CM | POA: Diagnosis not present

## 2021-01-15 DIAGNOSIS — Z992 Dependence on renal dialysis: Secondary | ICD-10-CM | POA: Diagnosis not present

## 2021-01-15 DIAGNOSIS — D631 Anemia in chronic kidney disease: Secondary | ICD-10-CM | POA: Diagnosis not present

## 2021-01-15 DIAGNOSIS — N186 End stage renal disease: Secondary | ICD-10-CM | POA: Diagnosis not present

## 2021-01-16 DIAGNOSIS — Z4932 Encounter for adequacy testing for peritoneal dialysis: Secondary | ICD-10-CM | POA: Diagnosis not present

## 2021-01-16 DIAGNOSIS — D631 Anemia in chronic kidney disease: Secondary | ICD-10-CM | POA: Diagnosis not present

## 2021-01-16 DIAGNOSIS — N186 End stage renal disease: Secondary | ICD-10-CM | POA: Diagnosis not present

## 2021-01-16 DIAGNOSIS — N2581 Secondary hyperparathyroidism of renal origin: Secondary | ICD-10-CM | POA: Diagnosis not present

## 2021-01-16 DIAGNOSIS — Z992 Dependence on renal dialysis: Secondary | ICD-10-CM | POA: Diagnosis not present

## 2021-01-17 DIAGNOSIS — D631 Anemia in chronic kidney disease: Secondary | ICD-10-CM | POA: Diagnosis not present

## 2021-01-17 DIAGNOSIS — Z992 Dependence on renal dialysis: Secondary | ICD-10-CM | POA: Diagnosis not present

## 2021-01-17 DIAGNOSIS — N2581 Secondary hyperparathyroidism of renal origin: Secondary | ICD-10-CM | POA: Diagnosis not present

## 2021-01-17 DIAGNOSIS — Z4932 Encounter for adequacy testing for peritoneal dialysis: Secondary | ICD-10-CM | POA: Diagnosis not present

## 2021-01-17 DIAGNOSIS — N186 End stage renal disease: Secondary | ICD-10-CM | POA: Diagnosis not present

## 2021-01-18 DIAGNOSIS — D631 Anemia in chronic kidney disease: Secondary | ICD-10-CM | POA: Diagnosis not present

## 2021-01-18 DIAGNOSIS — Z992 Dependence on renal dialysis: Secondary | ICD-10-CM | POA: Diagnosis not present

## 2021-01-18 DIAGNOSIS — N186 End stage renal disease: Secondary | ICD-10-CM | POA: Diagnosis not present

## 2021-01-18 DIAGNOSIS — Z4932 Encounter for adequacy testing for peritoneal dialysis: Secondary | ICD-10-CM | POA: Diagnosis not present

## 2021-01-18 DIAGNOSIS — N2581 Secondary hyperparathyroidism of renal origin: Secondary | ICD-10-CM | POA: Diagnosis not present

## 2021-01-19 DIAGNOSIS — N2581 Secondary hyperparathyroidism of renal origin: Secondary | ICD-10-CM | POA: Diagnosis not present

## 2021-01-19 DIAGNOSIS — N186 End stage renal disease: Secondary | ICD-10-CM | POA: Diagnosis not present

## 2021-01-19 DIAGNOSIS — Z992 Dependence on renal dialysis: Secondary | ICD-10-CM | POA: Diagnosis not present

## 2021-01-19 DIAGNOSIS — Z4932 Encounter for adequacy testing for peritoneal dialysis: Secondary | ICD-10-CM | POA: Diagnosis not present

## 2021-01-19 DIAGNOSIS — D631 Anemia in chronic kidney disease: Secondary | ICD-10-CM | POA: Diagnosis not present

## 2021-01-20 DIAGNOSIS — N186 End stage renal disease: Secondary | ICD-10-CM | POA: Diagnosis not present

## 2021-01-20 DIAGNOSIS — D631 Anemia in chronic kidney disease: Secondary | ICD-10-CM | POA: Diagnosis not present

## 2021-01-20 DIAGNOSIS — N2581 Secondary hyperparathyroidism of renal origin: Secondary | ICD-10-CM | POA: Diagnosis not present

## 2021-01-20 DIAGNOSIS — Z992 Dependence on renal dialysis: Secondary | ICD-10-CM | POA: Diagnosis not present

## 2021-01-20 DIAGNOSIS — Z4932 Encounter for adequacy testing for peritoneal dialysis: Secondary | ICD-10-CM | POA: Diagnosis not present

## 2021-01-21 DIAGNOSIS — Z992 Dependence on renal dialysis: Secondary | ICD-10-CM | POA: Diagnosis not present

## 2021-01-21 DIAGNOSIS — D631 Anemia in chronic kidney disease: Secondary | ICD-10-CM | POA: Diagnosis not present

## 2021-01-21 DIAGNOSIS — N2581 Secondary hyperparathyroidism of renal origin: Secondary | ICD-10-CM | POA: Diagnosis not present

## 2021-01-21 DIAGNOSIS — Z4932 Encounter for adequacy testing for peritoneal dialysis: Secondary | ICD-10-CM | POA: Diagnosis not present

## 2021-01-21 DIAGNOSIS — N186 End stage renal disease: Secondary | ICD-10-CM | POA: Diagnosis not present

## 2021-01-22 DIAGNOSIS — N186 End stage renal disease: Secondary | ICD-10-CM | POA: Diagnosis not present

## 2021-01-22 DIAGNOSIS — D631 Anemia in chronic kidney disease: Secondary | ICD-10-CM | POA: Diagnosis not present

## 2021-01-22 DIAGNOSIS — Z4932 Encounter for adequacy testing for peritoneal dialysis: Secondary | ICD-10-CM | POA: Diagnosis not present

## 2021-01-22 DIAGNOSIS — Z992 Dependence on renal dialysis: Secondary | ICD-10-CM | POA: Diagnosis not present

## 2021-01-22 DIAGNOSIS — N2581 Secondary hyperparathyroidism of renal origin: Secondary | ICD-10-CM | POA: Diagnosis not present

## 2021-01-23 DIAGNOSIS — Z992 Dependence on renal dialysis: Secondary | ICD-10-CM | POA: Diagnosis not present

## 2021-01-23 DIAGNOSIS — N186 End stage renal disease: Secondary | ICD-10-CM | POA: Diagnosis not present

## 2021-01-23 DIAGNOSIS — Z4932 Encounter for adequacy testing for peritoneal dialysis: Secondary | ICD-10-CM | POA: Diagnosis not present

## 2021-01-23 DIAGNOSIS — D631 Anemia in chronic kidney disease: Secondary | ICD-10-CM | POA: Diagnosis not present

## 2021-01-23 DIAGNOSIS — N2581 Secondary hyperparathyroidism of renal origin: Secondary | ICD-10-CM | POA: Diagnosis not present

## 2021-01-24 DIAGNOSIS — Z4932 Encounter for adequacy testing for peritoneal dialysis: Secondary | ICD-10-CM | POA: Diagnosis not present

## 2021-01-24 DIAGNOSIS — D631 Anemia in chronic kidney disease: Secondary | ICD-10-CM | POA: Diagnosis not present

## 2021-01-24 DIAGNOSIS — Z992 Dependence on renal dialysis: Secondary | ICD-10-CM | POA: Diagnosis not present

## 2021-01-24 DIAGNOSIS — N2581 Secondary hyperparathyroidism of renal origin: Secondary | ICD-10-CM | POA: Diagnosis not present

## 2021-01-24 DIAGNOSIS — N186 End stage renal disease: Secondary | ICD-10-CM | POA: Diagnosis not present

## 2021-01-25 DIAGNOSIS — Z4932 Encounter for adequacy testing for peritoneal dialysis: Secondary | ICD-10-CM | POA: Diagnosis not present

## 2021-01-25 DIAGNOSIS — Z992 Dependence on renal dialysis: Secondary | ICD-10-CM | POA: Diagnosis not present

## 2021-01-25 DIAGNOSIS — D631 Anemia in chronic kidney disease: Secondary | ICD-10-CM | POA: Diagnosis not present

## 2021-01-25 DIAGNOSIS — N186 End stage renal disease: Secondary | ICD-10-CM | POA: Diagnosis not present

## 2021-01-25 DIAGNOSIS — N2581 Secondary hyperparathyroidism of renal origin: Secondary | ICD-10-CM | POA: Diagnosis not present

## 2021-01-26 DIAGNOSIS — D631 Anemia in chronic kidney disease: Secondary | ICD-10-CM | POA: Diagnosis not present

## 2021-01-26 DIAGNOSIS — Z992 Dependence on renal dialysis: Secondary | ICD-10-CM | POA: Diagnosis not present

## 2021-01-26 DIAGNOSIS — N2581 Secondary hyperparathyroidism of renal origin: Secondary | ICD-10-CM | POA: Diagnosis not present

## 2021-01-26 DIAGNOSIS — Z4932 Encounter for adequacy testing for peritoneal dialysis: Secondary | ICD-10-CM | POA: Diagnosis not present

## 2021-01-26 DIAGNOSIS — N186 End stage renal disease: Secondary | ICD-10-CM | POA: Diagnosis not present

## 2021-01-27 DIAGNOSIS — Z4932 Encounter for adequacy testing for peritoneal dialysis: Secondary | ICD-10-CM | POA: Diagnosis not present

## 2021-01-27 DIAGNOSIS — D631 Anemia in chronic kidney disease: Secondary | ICD-10-CM | POA: Diagnosis not present

## 2021-01-27 DIAGNOSIS — N2581 Secondary hyperparathyroidism of renal origin: Secondary | ICD-10-CM | POA: Diagnosis not present

## 2021-01-27 DIAGNOSIS — Z992 Dependence on renal dialysis: Secondary | ICD-10-CM | POA: Diagnosis not present

## 2021-01-27 DIAGNOSIS — N186 End stage renal disease: Secondary | ICD-10-CM | POA: Diagnosis not present

## 2021-01-28 DIAGNOSIS — Z992 Dependence on renal dialysis: Secondary | ICD-10-CM | POA: Diagnosis not present

## 2021-01-28 DIAGNOSIS — N2581 Secondary hyperparathyroidism of renal origin: Secondary | ICD-10-CM | POA: Diagnosis not present

## 2021-01-28 DIAGNOSIS — D631 Anemia in chronic kidney disease: Secondary | ICD-10-CM | POA: Diagnosis not present

## 2021-01-28 DIAGNOSIS — N186 End stage renal disease: Secondary | ICD-10-CM | POA: Diagnosis not present

## 2021-01-28 DIAGNOSIS — Z4932 Encounter for adequacy testing for peritoneal dialysis: Secondary | ICD-10-CM | POA: Diagnosis not present

## 2021-01-29 DIAGNOSIS — D631 Anemia in chronic kidney disease: Secondary | ICD-10-CM | POA: Diagnosis not present

## 2021-01-29 DIAGNOSIS — Z992 Dependence on renal dialysis: Secondary | ICD-10-CM | POA: Diagnosis not present

## 2021-01-29 DIAGNOSIS — N186 End stage renal disease: Secondary | ICD-10-CM | POA: Diagnosis not present

## 2021-01-29 DIAGNOSIS — Z4932 Encounter for adequacy testing for peritoneal dialysis: Secondary | ICD-10-CM | POA: Diagnosis not present

## 2021-01-29 DIAGNOSIS — N2581 Secondary hyperparathyroidism of renal origin: Secondary | ICD-10-CM | POA: Diagnosis not present

## 2021-01-30 DIAGNOSIS — Z992 Dependence on renal dialysis: Secondary | ICD-10-CM | POA: Diagnosis not present

## 2021-01-30 DIAGNOSIS — Z4932 Encounter for adequacy testing for peritoneal dialysis: Secondary | ICD-10-CM | POA: Diagnosis not present

## 2021-01-30 DIAGNOSIS — N186 End stage renal disease: Secondary | ICD-10-CM | POA: Diagnosis not present

## 2021-01-30 DIAGNOSIS — D631 Anemia in chronic kidney disease: Secondary | ICD-10-CM | POA: Diagnosis not present

## 2021-01-30 DIAGNOSIS — N2581 Secondary hyperparathyroidism of renal origin: Secondary | ICD-10-CM | POA: Diagnosis not present

## 2021-01-31 DIAGNOSIS — N2581 Secondary hyperparathyroidism of renal origin: Secondary | ICD-10-CM | POA: Diagnosis not present

## 2021-01-31 DIAGNOSIS — N186 End stage renal disease: Secondary | ICD-10-CM | POA: Diagnosis not present

## 2021-01-31 DIAGNOSIS — D631 Anemia in chronic kidney disease: Secondary | ICD-10-CM | POA: Diagnosis not present

## 2021-01-31 DIAGNOSIS — Z4932 Encounter for adequacy testing for peritoneal dialysis: Secondary | ICD-10-CM | POA: Diagnosis not present

## 2021-01-31 DIAGNOSIS — Z992 Dependence on renal dialysis: Secondary | ICD-10-CM | POA: Diagnosis not present

## 2021-02-01 DIAGNOSIS — Z4932 Encounter for adequacy testing for peritoneal dialysis: Secondary | ICD-10-CM | POA: Diagnosis not present

## 2021-02-01 DIAGNOSIS — N2581 Secondary hyperparathyroidism of renal origin: Secondary | ICD-10-CM | POA: Diagnosis not present

## 2021-02-01 DIAGNOSIS — Z992 Dependence on renal dialysis: Secondary | ICD-10-CM | POA: Diagnosis not present

## 2021-02-01 DIAGNOSIS — D631 Anemia in chronic kidney disease: Secondary | ICD-10-CM | POA: Diagnosis not present

## 2021-02-01 DIAGNOSIS — N186 End stage renal disease: Secondary | ICD-10-CM | POA: Diagnosis not present

## 2021-02-02 DIAGNOSIS — Z992 Dependence on renal dialysis: Secondary | ICD-10-CM | POA: Diagnosis not present

## 2021-02-02 DIAGNOSIS — D631 Anemia in chronic kidney disease: Secondary | ICD-10-CM | POA: Diagnosis not present

## 2021-02-02 DIAGNOSIS — N2581 Secondary hyperparathyroidism of renal origin: Secondary | ICD-10-CM | POA: Diagnosis not present

## 2021-02-02 DIAGNOSIS — N186 End stage renal disease: Secondary | ICD-10-CM | POA: Diagnosis not present

## 2021-02-02 DIAGNOSIS — Z4932 Encounter for adequacy testing for peritoneal dialysis: Secondary | ICD-10-CM | POA: Diagnosis not present

## 2021-02-03 DIAGNOSIS — Z992 Dependence on renal dialysis: Secondary | ICD-10-CM | POA: Diagnosis not present

## 2021-02-03 DIAGNOSIS — Z4932 Encounter for adequacy testing for peritoneal dialysis: Secondary | ICD-10-CM | POA: Diagnosis not present

## 2021-02-03 DIAGNOSIS — N186 End stage renal disease: Secondary | ICD-10-CM | POA: Diagnosis not present

## 2021-02-03 DIAGNOSIS — N2581 Secondary hyperparathyroidism of renal origin: Secondary | ICD-10-CM | POA: Diagnosis not present

## 2021-02-03 DIAGNOSIS — D631 Anemia in chronic kidney disease: Secondary | ICD-10-CM | POA: Diagnosis not present

## 2021-02-04 DIAGNOSIS — N2581 Secondary hyperparathyroidism of renal origin: Secondary | ICD-10-CM | POA: Diagnosis not present

## 2021-02-04 DIAGNOSIS — D631 Anemia in chronic kidney disease: Secondary | ICD-10-CM | POA: Diagnosis not present

## 2021-02-04 DIAGNOSIS — N186 End stage renal disease: Secondary | ICD-10-CM | POA: Diagnosis not present

## 2021-02-04 DIAGNOSIS — Z992 Dependence on renal dialysis: Secondary | ICD-10-CM | POA: Diagnosis not present

## 2021-02-04 DIAGNOSIS — Z4932 Encounter for adequacy testing for peritoneal dialysis: Secondary | ICD-10-CM | POA: Diagnosis not present

## 2021-02-05 DIAGNOSIS — Z4932 Encounter for adequacy testing for peritoneal dialysis: Secondary | ICD-10-CM | POA: Diagnosis not present

## 2021-02-05 DIAGNOSIS — D631 Anemia in chronic kidney disease: Secondary | ICD-10-CM | POA: Diagnosis not present

## 2021-02-05 DIAGNOSIS — N2581 Secondary hyperparathyroidism of renal origin: Secondary | ICD-10-CM | POA: Diagnosis not present

## 2021-02-05 DIAGNOSIS — Z992 Dependence on renal dialysis: Secondary | ICD-10-CM | POA: Diagnosis not present

## 2021-02-05 DIAGNOSIS — N186 End stage renal disease: Secondary | ICD-10-CM | POA: Diagnosis not present

## 2021-02-06 DIAGNOSIS — Z992 Dependence on renal dialysis: Secondary | ICD-10-CM | POA: Diagnosis not present

## 2021-02-06 DIAGNOSIS — N2581 Secondary hyperparathyroidism of renal origin: Secondary | ICD-10-CM | POA: Diagnosis not present

## 2021-02-06 DIAGNOSIS — Z4932 Encounter for adequacy testing for peritoneal dialysis: Secondary | ICD-10-CM | POA: Diagnosis not present

## 2021-02-06 DIAGNOSIS — D631 Anemia in chronic kidney disease: Secondary | ICD-10-CM | POA: Diagnosis not present

## 2021-02-06 DIAGNOSIS — N186 End stage renal disease: Secondary | ICD-10-CM | POA: Diagnosis not present

## 2021-02-07 DIAGNOSIS — Z992 Dependence on renal dialysis: Secondary | ICD-10-CM | POA: Diagnosis not present

## 2021-02-07 DIAGNOSIS — Z4932 Encounter for adequacy testing for peritoneal dialysis: Secondary | ICD-10-CM | POA: Diagnosis not present

## 2021-02-07 DIAGNOSIS — D631 Anemia in chronic kidney disease: Secondary | ICD-10-CM | POA: Diagnosis not present

## 2021-02-07 DIAGNOSIS — N186 End stage renal disease: Secondary | ICD-10-CM | POA: Diagnosis not present

## 2021-02-07 DIAGNOSIS — N2581 Secondary hyperparathyroidism of renal origin: Secondary | ICD-10-CM | POA: Diagnosis not present

## 2021-02-08 DIAGNOSIS — N2581 Secondary hyperparathyroidism of renal origin: Secondary | ICD-10-CM | POA: Diagnosis not present

## 2021-02-08 DIAGNOSIS — Z4932 Encounter for adequacy testing for peritoneal dialysis: Secondary | ICD-10-CM | POA: Diagnosis not present

## 2021-02-08 DIAGNOSIS — N186 End stage renal disease: Secondary | ICD-10-CM | POA: Diagnosis not present

## 2021-02-08 DIAGNOSIS — D631 Anemia in chronic kidney disease: Secondary | ICD-10-CM | POA: Diagnosis not present

## 2021-02-08 DIAGNOSIS — Z992 Dependence on renal dialysis: Secondary | ICD-10-CM | POA: Diagnosis not present

## 2021-02-09 DIAGNOSIS — Z4932 Encounter for adequacy testing for peritoneal dialysis: Secondary | ICD-10-CM | POA: Diagnosis not present

## 2021-02-09 DIAGNOSIS — N2581 Secondary hyperparathyroidism of renal origin: Secondary | ICD-10-CM | POA: Diagnosis not present

## 2021-02-09 DIAGNOSIS — D631 Anemia in chronic kidney disease: Secondary | ICD-10-CM | POA: Diagnosis not present

## 2021-02-09 DIAGNOSIS — N186 End stage renal disease: Secondary | ICD-10-CM | POA: Diagnosis not present

## 2021-02-09 DIAGNOSIS — Z992 Dependence on renal dialysis: Secondary | ICD-10-CM | POA: Diagnosis not present

## 2021-02-10 DIAGNOSIS — Z992 Dependence on renal dialysis: Secondary | ICD-10-CM | POA: Diagnosis not present

## 2021-02-10 DIAGNOSIS — N2581 Secondary hyperparathyroidism of renal origin: Secondary | ICD-10-CM | POA: Diagnosis not present

## 2021-02-10 DIAGNOSIS — N186 End stage renal disease: Secondary | ICD-10-CM | POA: Diagnosis not present

## 2021-02-10 DIAGNOSIS — Z4932 Encounter for adequacy testing for peritoneal dialysis: Secondary | ICD-10-CM | POA: Diagnosis not present

## 2021-02-10 DIAGNOSIS — Z23 Encounter for immunization: Secondary | ICD-10-CM | POA: Diagnosis not present

## 2021-02-11 DIAGNOSIS — N186 End stage renal disease: Secondary | ICD-10-CM | POA: Diagnosis not present

## 2021-02-11 DIAGNOSIS — Z23 Encounter for immunization: Secondary | ICD-10-CM | POA: Diagnosis not present

## 2021-02-11 DIAGNOSIS — N2581 Secondary hyperparathyroidism of renal origin: Secondary | ICD-10-CM | POA: Diagnosis not present

## 2021-02-11 DIAGNOSIS — Z4932 Encounter for adequacy testing for peritoneal dialysis: Secondary | ICD-10-CM | POA: Diagnosis not present

## 2021-02-11 DIAGNOSIS — Z992 Dependence on renal dialysis: Secondary | ICD-10-CM | POA: Diagnosis not present

## 2021-02-12 DIAGNOSIS — N186 End stage renal disease: Secondary | ICD-10-CM | POA: Diagnosis not present

## 2021-02-12 DIAGNOSIS — N2581 Secondary hyperparathyroidism of renal origin: Secondary | ICD-10-CM | POA: Diagnosis not present

## 2021-02-12 DIAGNOSIS — Z23 Encounter for immunization: Secondary | ICD-10-CM | POA: Diagnosis not present

## 2021-02-12 DIAGNOSIS — Z992 Dependence on renal dialysis: Secondary | ICD-10-CM | POA: Diagnosis not present

## 2021-02-12 DIAGNOSIS — Z4932 Encounter for adequacy testing for peritoneal dialysis: Secondary | ICD-10-CM | POA: Diagnosis not present

## 2021-02-13 DIAGNOSIS — Z4932 Encounter for adequacy testing for peritoneal dialysis: Secondary | ICD-10-CM | POA: Diagnosis not present

## 2021-02-13 DIAGNOSIS — Z992 Dependence on renal dialysis: Secondary | ICD-10-CM | POA: Diagnosis not present

## 2021-02-13 DIAGNOSIS — N186 End stage renal disease: Secondary | ICD-10-CM | POA: Diagnosis not present

## 2021-02-13 DIAGNOSIS — Z23 Encounter for immunization: Secondary | ICD-10-CM | POA: Diagnosis not present

## 2021-02-13 DIAGNOSIS — N2581 Secondary hyperparathyroidism of renal origin: Secondary | ICD-10-CM | POA: Diagnosis not present

## 2021-02-14 DIAGNOSIS — N186 End stage renal disease: Secondary | ICD-10-CM | POA: Diagnosis not present

## 2021-02-14 DIAGNOSIS — N2581 Secondary hyperparathyroidism of renal origin: Secondary | ICD-10-CM | POA: Diagnosis not present

## 2021-02-14 DIAGNOSIS — Z992 Dependence on renal dialysis: Secondary | ICD-10-CM | POA: Diagnosis not present

## 2021-02-14 DIAGNOSIS — Z23 Encounter for immunization: Secondary | ICD-10-CM | POA: Diagnosis not present

## 2021-02-14 DIAGNOSIS — Z4932 Encounter for adequacy testing for peritoneal dialysis: Secondary | ICD-10-CM | POA: Diagnosis not present

## 2021-02-15 DIAGNOSIS — N186 End stage renal disease: Secondary | ICD-10-CM | POA: Diagnosis not present

## 2021-02-15 DIAGNOSIS — Z23 Encounter for immunization: Secondary | ICD-10-CM | POA: Diagnosis not present

## 2021-02-15 DIAGNOSIS — N2581 Secondary hyperparathyroidism of renal origin: Secondary | ICD-10-CM | POA: Diagnosis not present

## 2021-02-15 DIAGNOSIS — Z4932 Encounter for adequacy testing for peritoneal dialysis: Secondary | ICD-10-CM | POA: Diagnosis not present

## 2021-02-15 DIAGNOSIS — Z992 Dependence on renal dialysis: Secondary | ICD-10-CM | POA: Diagnosis not present

## 2021-02-16 DIAGNOSIS — Z4932 Encounter for adequacy testing for peritoneal dialysis: Secondary | ICD-10-CM | POA: Diagnosis not present

## 2021-02-16 DIAGNOSIS — N2581 Secondary hyperparathyroidism of renal origin: Secondary | ICD-10-CM | POA: Diagnosis not present

## 2021-02-16 DIAGNOSIS — Z23 Encounter for immunization: Secondary | ICD-10-CM | POA: Diagnosis not present

## 2021-02-16 DIAGNOSIS — Z992 Dependence on renal dialysis: Secondary | ICD-10-CM | POA: Diagnosis not present

## 2021-02-16 DIAGNOSIS — N186 End stage renal disease: Secondary | ICD-10-CM | POA: Diagnosis not present

## 2021-02-17 DIAGNOSIS — N2581 Secondary hyperparathyroidism of renal origin: Secondary | ICD-10-CM | POA: Diagnosis not present

## 2021-02-17 DIAGNOSIS — Z992 Dependence on renal dialysis: Secondary | ICD-10-CM | POA: Diagnosis not present

## 2021-02-17 DIAGNOSIS — N186 End stage renal disease: Secondary | ICD-10-CM | POA: Diagnosis not present

## 2021-02-17 DIAGNOSIS — Z23 Encounter for immunization: Secondary | ICD-10-CM | POA: Diagnosis not present

## 2021-02-17 DIAGNOSIS — Z4932 Encounter for adequacy testing for peritoneal dialysis: Secondary | ICD-10-CM | POA: Diagnosis not present

## 2021-02-18 DIAGNOSIS — Z4932 Encounter for adequacy testing for peritoneal dialysis: Secondary | ICD-10-CM | POA: Diagnosis not present

## 2021-02-18 DIAGNOSIS — Z23 Encounter for immunization: Secondary | ICD-10-CM | POA: Diagnosis not present

## 2021-02-18 DIAGNOSIS — N186 End stage renal disease: Secondary | ICD-10-CM | POA: Diagnosis not present

## 2021-02-18 DIAGNOSIS — Z992 Dependence on renal dialysis: Secondary | ICD-10-CM | POA: Diagnosis not present

## 2021-02-18 DIAGNOSIS — N2581 Secondary hyperparathyroidism of renal origin: Secondary | ICD-10-CM | POA: Diagnosis not present

## 2021-02-19 DIAGNOSIS — N186 End stage renal disease: Secondary | ICD-10-CM | POA: Diagnosis not present

## 2021-02-19 DIAGNOSIS — Z992 Dependence on renal dialysis: Secondary | ICD-10-CM | POA: Diagnosis not present

## 2021-02-19 DIAGNOSIS — N2581 Secondary hyperparathyroidism of renal origin: Secondary | ICD-10-CM | POA: Diagnosis not present

## 2021-02-19 DIAGNOSIS — Z4932 Encounter for adequacy testing for peritoneal dialysis: Secondary | ICD-10-CM | POA: Diagnosis not present

## 2021-02-19 DIAGNOSIS — Z23 Encounter for immunization: Secondary | ICD-10-CM | POA: Diagnosis not present

## 2021-02-20 DIAGNOSIS — Z23 Encounter for immunization: Secondary | ICD-10-CM | POA: Diagnosis not present

## 2021-02-20 DIAGNOSIS — Z4932 Encounter for adequacy testing for peritoneal dialysis: Secondary | ICD-10-CM | POA: Diagnosis not present

## 2021-02-20 DIAGNOSIS — Z992 Dependence on renal dialysis: Secondary | ICD-10-CM | POA: Diagnosis not present

## 2021-02-20 DIAGNOSIS — N186 End stage renal disease: Secondary | ICD-10-CM | POA: Diagnosis not present

## 2021-02-20 DIAGNOSIS — N2581 Secondary hyperparathyroidism of renal origin: Secondary | ICD-10-CM | POA: Diagnosis not present

## 2021-02-21 DIAGNOSIS — Z4932 Encounter for adequacy testing for peritoneal dialysis: Secondary | ICD-10-CM | POA: Diagnosis not present

## 2021-02-21 DIAGNOSIS — N186 End stage renal disease: Secondary | ICD-10-CM | POA: Diagnosis not present

## 2021-02-21 DIAGNOSIS — N2581 Secondary hyperparathyroidism of renal origin: Secondary | ICD-10-CM | POA: Diagnosis not present

## 2021-02-21 DIAGNOSIS — Z23 Encounter for immunization: Secondary | ICD-10-CM | POA: Diagnosis not present

## 2021-02-21 DIAGNOSIS — Z992 Dependence on renal dialysis: Secondary | ICD-10-CM | POA: Diagnosis not present

## 2021-02-22 DIAGNOSIS — Z4932 Encounter for adequacy testing for peritoneal dialysis: Secondary | ICD-10-CM | POA: Diagnosis not present

## 2021-02-22 DIAGNOSIS — Z992 Dependence on renal dialysis: Secondary | ICD-10-CM | POA: Diagnosis not present

## 2021-02-22 DIAGNOSIS — Z23 Encounter for immunization: Secondary | ICD-10-CM | POA: Diagnosis not present

## 2021-02-22 DIAGNOSIS — N186 End stage renal disease: Secondary | ICD-10-CM | POA: Diagnosis not present

## 2021-02-22 DIAGNOSIS — N2581 Secondary hyperparathyroidism of renal origin: Secondary | ICD-10-CM | POA: Diagnosis not present

## 2021-02-23 DIAGNOSIS — Z23 Encounter for immunization: Secondary | ICD-10-CM | POA: Diagnosis not present

## 2021-02-23 DIAGNOSIS — Z992 Dependence on renal dialysis: Secondary | ICD-10-CM | POA: Diagnosis not present

## 2021-02-23 DIAGNOSIS — N186 End stage renal disease: Secondary | ICD-10-CM | POA: Diagnosis not present

## 2021-02-23 DIAGNOSIS — N2581 Secondary hyperparathyroidism of renal origin: Secondary | ICD-10-CM | POA: Diagnosis not present

## 2021-02-23 DIAGNOSIS — Z4932 Encounter for adequacy testing for peritoneal dialysis: Secondary | ICD-10-CM | POA: Diagnosis not present

## 2021-02-24 DIAGNOSIS — N186 End stage renal disease: Secondary | ICD-10-CM | POA: Diagnosis not present

## 2021-02-24 DIAGNOSIS — N2581 Secondary hyperparathyroidism of renal origin: Secondary | ICD-10-CM | POA: Diagnosis not present

## 2021-02-24 DIAGNOSIS — Z992 Dependence on renal dialysis: Secondary | ICD-10-CM | POA: Diagnosis not present

## 2021-02-24 DIAGNOSIS — Z4932 Encounter for adequacy testing for peritoneal dialysis: Secondary | ICD-10-CM | POA: Diagnosis not present

## 2021-02-24 DIAGNOSIS — Z23 Encounter for immunization: Secondary | ICD-10-CM | POA: Diagnosis not present

## 2021-02-25 DIAGNOSIS — N186 End stage renal disease: Secondary | ICD-10-CM | POA: Diagnosis not present

## 2021-02-25 DIAGNOSIS — Z4932 Encounter for adequacy testing for peritoneal dialysis: Secondary | ICD-10-CM | POA: Diagnosis not present

## 2021-02-25 DIAGNOSIS — N2581 Secondary hyperparathyroidism of renal origin: Secondary | ICD-10-CM | POA: Diagnosis not present

## 2021-02-25 DIAGNOSIS — Z992 Dependence on renal dialysis: Secondary | ICD-10-CM | POA: Diagnosis not present

## 2021-02-25 DIAGNOSIS — Z23 Encounter for immunization: Secondary | ICD-10-CM | POA: Diagnosis not present

## 2021-02-26 DIAGNOSIS — Z992 Dependence on renal dialysis: Secondary | ICD-10-CM | POA: Diagnosis not present

## 2021-02-26 DIAGNOSIS — Z4932 Encounter for adequacy testing for peritoneal dialysis: Secondary | ICD-10-CM | POA: Diagnosis not present

## 2021-02-26 DIAGNOSIS — N2581 Secondary hyperparathyroidism of renal origin: Secondary | ICD-10-CM | POA: Diagnosis not present

## 2021-02-26 DIAGNOSIS — Z23 Encounter for immunization: Secondary | ICD-10-CM | POA: Diagnosis not present

## 2021-02-26 DIAGNOSIS — N186 End stage renal disease: Secondary | ICD-10-CM | POA: Diagnosis not present

## 2021-02-27 DIAGNOSIS — Z4932 Encounter for adequacy testing for peritoneal dialysis: Secondary | ICD-10-CM | POA: Diagnosis not present

## 2021-02-27 DIAGNOSIS — N186 End stage renal disease: Secondary | ICD-10-CM | POA: Diagnosis not present

## 2021-02-27 DIAGNOSIS — Z992 Dependence on renal dialysis: Secondary | ICD-10-CM | POA: Diagnosis not present

## 2021-02-27 DIAGNOSIS — N2581 Secondary hyperparathyroidism of renal origin: Secondary | ICD-10-CM | POA: Diagnosis not present

## 2021-02-27 DIAGNOSIS — Z23 Encounter for immunization: Secondary | ICD-10-CM | POA: Diagnosis not present

## 2021-02-28 DIAGNOSIS — Z4932 Encounter for adequacy testing for peritoneal dialysis: Secondary | ICD-10-CM | POA: Diagnosis not present

## 2021-02-28 DIAGNOSIS — N186 End stage renal disease: Secondary | ICD-10-CM | POA: Diagnosis not present

## 2021-02-28 DIAGNOSIS — Z992 Dependence on renal dialysis: Secondary | ICD-10-CM | POA: Diagnosis not present

## 2021-02-28 DIAGNOSIS — N2581 Secondary hyperparathyroidism of renal origin: Secondary | ICD-10-CM | POA: Diagnosis not present

## 2021-02-28 DIAGNOSIS — Z23 Encounter for immunization: Secondary | ICD-10-CM | POA: Diagnosis not present

## 2021-03-01 DIAGNOSIS — Z4932 Encounter for adequacy testing for peritoneal dialysis: Secondary | ICD-10-CM | POA: Diagnosis not present

## 2021-03-01 DIAGNOSIS — Z23 Encounter for immunization: Secondary | ICD-10-CM | POA: Diagnosis not present

## 2021-03-01 DIAGNOSIS — N2581 Secondary hyperparathyroidism of renal origin: Secondary | ICD-10-CM | POA: Diagnosis not present

## 2021-03-01 DIAGNOSIS — N186 End stage renal disease: Secondary | ICD-10-CM | POA: Diagnosis not present

## 2021-03-01 DIAGNOSIS — Z992 Dependence on renal dialysis: Secondary | ICD-10-CM | POA: Diagnosis not present

## 2021-03-02 DIAGNOSIS — Z4932 Encounter for adequacy testing for peritoneal dialysis: Secondary | ICD-10-CM | POA: Diagnosis not present

## 2021-03-02 DIAGNOSIS — Z23 Encounter for immunization: Secondary | ICD-10-CM | POA: Diagnosis not present

## 2021-03-02 DIAGNOSIS — N2581 Secondary hyperparathyroidism of renal origin: Secondary | ICD-10-CM | POA: Diagnosis not present

## 2021-03-02 DIAGNOSIS — N186 End stage renal disease: Secondary | ICD-10-CM | POA: Diagnosis not present

## 2021-03-02 DIAGNOSIS — Z992 Dependence on renal dialysis: Secondary | ICD-10-CM | POA: Diagnosis not present

## 2021-03-03 DIAGNOSIS — N2581 Secondary hyperparathyroidism of renal origin: Secondary | ICD-10-CM | POA: Diagnosis not present

## 2021-03-03 DIAGNOSIS — N186 End stage renal disease: Secondary | ICD-10-CM | POA: Diagnosis not present

## 2021-03-03 DIAGNOSIS — Z992 Dependence on renal dialysis: Secondary | ICD-10-CM | POA: Diagnosis not present

## 2021-03-03 DIAGNOSIS — Z23 Encounter for immunization: Secondary | ICD-10-CM | POA: Diagnosis not present

## 2021-03-03 DIAGNOSIS — Z4932 Encounter for adequacy testing for peritoneal dialysis: Secondary | ICD-10-CM | POA: Diagnosis not present

## 2021-03-04 DIAGNOSIS — Z4932 Encounter for adequacy testing for peritoneal dialysis: Secondary | ICD-10-CM | POA: Diagnosis not present

## 2021-03-04 DIAGNOSIS — Z992 Dependence on renal dialysis: Secondary | ICD-10-CM | POA: Diagnosis not present

## 2021-03-04 DIAGNOSIS — N186 End stage renal disease: Secondary | ICD-10-CM | POA: Diagnosis not present

## 2021-03-04 DIAGNOSIS — N2581 Secondary hyperparathyroidism of renal origin: Secondary | ICD-10-CM | POA: Diagnosis not present

## 2021-03-04 DIAGNOSIS — Z23 Encounter for immunization: Secondary | ICD-10-CM | POA: Diagnosis not present

## 2021-03-05 DIAGNOSIS — Z23 Encounter for immunization: Secondary | ICD-10-CM | POA: Diagnosis not present

## 2021-03-05 DIAGNOSIS — Z992 Dependence on renal dialysis: Secondary | ICD-10-CM | POA: Diagnosis not present

## 2021-03-05 DIAGNOSIS — N186 End stage renal disease: Secondary | ICD-10-CM | POA: Diagnosis not present

## 2021-03-05 DIAGNOSIS — N2581 Secondary hyperparathyroidism of renal origin: Secondary | ICD-10-CM | POA: Diagnosis not present

## 2021-03-05 DIAGNOSIS — Z4932 Encounter for adequacy testing for peritoneal dialysis: Secondary | ICD-10-CM | POA: Diagnosis not present

## 2021-03-06 DIAGNOSIS — N2581 Secondary hyperparathyroidism of renal origin: Secondary | ICD-10-CM | POA: Diagnosis not present

## 2021-03-06 DIAGNOSIS — N186 End stage renal disease: Secondary | ICD-10-CM | POA: Diagnosis not present

## 2021-03-06 DIAGNOSIS — Z23 Encounter for immunization: Secondary | ICD-10-CM | POA: Diagnosis not present

## 2021-03-06 DIAGNOSIS — Z4932 Encounter for adequacy testing for peritoneal dialysis: Secondary | ICD-10-CM | POA: Diagnosis not present

## 2021-03-06 DIAGNOSIS — Z992 Dependence on renal dialysis: Secondary | ICD-10-CM | POA: Diagnosis not present

## 2021-03-07 DIAGNOSIS — N186 End stage renal disease: Secondary | ICD-10-CM | POA: Diagnosis not present

## 2021-03-07 DIAGNOSIS — Z4932 Encounter for adequacy testing for peritoneal dialysis: Secondary | ICD-10-CM | POA: Diagnosis not present

## 2021-03-07 DIAGNOSIS — Z23 Encounter for immunization: Secondary | ICD-10-CM | POA: Diagnosis not present

## 2021-03-07 DIAGNOSIS — Z992 Dependence on renal dialysis: Secondary | ICD-10-CM | POA: Diagnosis not present

## 2021-03-07 DIAGNOSIS — N2581 Secondary hyperparathyroidism of renal origin: Secondary | ICD-10-CM | POA: Diagnosis not present

## 2021-03-08 DIAGNOSIS — N2581 Secondary hyperparathyroidism of renal origin: Secondary | ICD-10-CM | POA: Diagnosis not present

## 2021-03-08 DIAGNOSIS — Z992 Dependence on renal dialysis: Secondary | ICD-10-CM | POA: Diagnosis not present

## 2021-03-08 DIAGNOSIS — Z4932 Encounter for adequacy testing for peritoneal dialysis: Secondary | ICD-10-CM | POA: Diagnosis not present

## 2021-03-08 DIAGNOSIS — Z23 Encounter for immunization: Secondary | ICD-10-CM | POA: Diagnosis not present

## 2021-03-08 DIAGNOSIS — N186 End stage renal disease: Secondary | ICD-10-CM | POA: Diagnosis not present

## 2021-03-09 DIAGNOSIS — Z4932 Encounter for adequacy testing for peritoneal dialysis: Secondary | ICD-10-CM | POA: Diagnosis not present

## 2021-03-09 DIAGNOSIS — N2581 Secondary hyperparathyroidism of renal origin: Secondary | ICD-10-CM | POA: Diagnosis not present

## 2021-03-09 DIAGNOSIS — Z992 Dependence on renal dialysis: Secondary | ICD-10-CM | POA: Diagnosis not present

## 2021-03-09 DIAGNOSIS — N186 End stage renal disease: Secondary | ICD-10-CM | POA: Diagnosis not present

## 2021-03-09 DIAGNOSIS — Z23 Encounter for immunization: Secondary | ICD-10-CM | POA: Diagnosis not present

## 2021-03-10 DIAGNOSIS — Z4932 Encounter for adequacy testing for peritoneal dialysis: Secondary | ICD-10-CM | POA: Diagnosis not present

## 2021-03-10 DIAGNOSIS — Z992 Dependence on renal dialysis: Secondary | ICD-10-CM | POA: Diagnosis not present

## 2021-03-10 DIAGNOSIS — Z23 Encounter for immunization: Secondary | ICD-10-CM | POA: Diagnosis not present

## 2021-03-10 DIAGNOSIS — N186 End stage renal disease: Secondary | ICD-10-CM | POA: Diagnosis not present

## 2021-03-10 DIAGNOSIS — N2581 Secondary hyperparathyroidism of renal origin: Secondary | ICD-10-CM | POA: Diagnosis not present

## 2021-03-11 DIAGNOSIS — Z992 Dependence on renal dialysis: Secondary | ICD-10-CM | POA: Diagnosis not present

## 2021-03-11 DIAGNOSIS — Z4932 Encounter for adequacy testing for peritoneal dialysis: Secondary | ICD-10-CM | POA: Diagnosis not present

## 2021-03-11 DIAGNOSIS — I129 Hypertensive chronic kidney disease with stage 1 through stage 4 chronic kidney disease, or unspecified chronic kidney disease: Secondary | ICD-10-CM | POA: Diagnosis not present

## 2021-03-11 DIAGNOSIS — N186 End stage renal disease: Secondary | ICD-10-CM | POA: Diagnosis not present

## 2021-03-11 DIAGNOSIS — Z23 Encounter for immunization: Secondary | ICD-10-CM | POA: Diagnosis not present

## 2021-03-11 DIAGNOSIS — N2581 Secondary hyperparathyroidism of renal origin: Secondary | ICD-10-CM | POA: Diagnosis not present

## 2021-03-12 DIAGNOSIS — Z4932 Encounter for adequacy testing for peritoneal dialysis: Secondary | ICD-10-CM | POA: Diagnosis not present

## 2021-03-12 DIAGNOSIS — N2581 Secondary hyperparathyroidism of renal origin: Secondary | ICD-10-CM | POA: Diagnosis not present

## 2021-03-12 DIAGNOSIS — N185 Chronic kidney disease, stage 5: Secondary | ICD-10-CM | POA: Diagnosis not present

## 2021-03-12 DIAGNOSIS — N186 End stage renal disease: Secondary | ICD-10-CM | POA: Diagnosis not present

## 2021-03-12 DIAGNOSIS — Z992 Dependence on renal dialysis: Secondary | ICD-10-CM | POA: Diagnosis not present

## 2021-03-13 DIAGNOSIS — Z992 Dependence on renal dialysis: Secondary | ICD-10-CM | POA: Diagnosis not present

## 2021-03-13 DIAGNOSIS — N2581 Secondary hyperparathyroidism of renal origin: Secondary | ICD-10-CM | POA: Diagnosis not present

## 2021-03-13 DIAGNOSIS — N186 End stage renal disease: Secondary | ICD-10-CM | POA: Diagnosis not present

## 2021-03-13 DIAGNOSIS — Z4932 Encounter for adequacy testing for peritoneal dialysis: Secondary | ICD-10-CM | POA: Diagnosis not present

## 2021-03-13 DIAGNOSIS — N185 Chronic kidney disease, stage 5: Secondary | ICD-10-CM | POA: Diagnosis not present

## 2021-03-14 DIAGNOSIS — Z992 Dependence on renal dialysis: Secondary | ICD-10-CM | POA: Diagnosis not present

## 2021-03-14 DIAGNOSIS — N186 End stage renal disease: Secondary | ICD-10-CM | POA: Diagnosis not present

## 2021-03-14 DIAGNOSIS — Z4932 Encounter for adequacy testing for peritoneal dialysis: Secondary | ICD-10-CM | POA: Diagnosis not present

## 2021-03-14 DIAGNOSIS — N2581 Secondary hyperparathyroidism of renal origin: Secondary | ICD-10-CM | POA: Diagnosis not present

## 2021-03-14 DIAGNOSIS — N185 Chronic kidney disease, stage 5: Secondary | ICD-10-CM | POA: Diagnosis not present

## 2021-03-15 DIAGNOSIS — N186 End stage renal disease: Secondary | ICD-10-CM | POA: Diagnosis not present

## 2021-03-15 DIAGNOSIS — Z992 Dependence on renal dialysis: Secondary | ICD-10-CM | POA: Diagnosis not present

## 2021-03-15 DIAGNOSIS — Z4932 Encounter for adequacy testing for peritoneal dialysis: Secondary | ICD-10-CM | POA: Diagnosis not present

## 2021-03-15 DIAGNOSIS — N2581 Secondary hyperparathyroidism of renal origin: Secondary | ICD-10-CM | POA: Diagnosis not present

## 2021-03-15 DIAGNOSIS — N185 Chronic kidney disease, stage 5: Secondary | ICD-10-CM | POA: Diagnosis not present

## 2021-03-16 DIAGNOSIS — N2581 Secondary hyperparathyroidism of renal origin: Secondary | ICD-10-CM | POA: Diagnosis not present

## 2021-03-16 DIAGNOSIS — N185 Chronic kidney disease, stage 5: Secondary | ICD-10-CM | POA: Diagnosis not present

## 2021-03-16 DIAGNOSIS — N186 End stage renal disease: Secondary | ICD-10-CM | POA: Diagnosis not present

## 2021-03-16 DIAGNOSIS — Z4932 Encounter for adequacy testing for peritoneal dialysis: Secondary | ICD-10-CM | POA: Diagnosis not present

## 2021-03-16 DIAGNOSIS — Z992 Dependence on renal dialysis: Secondary | ICD-10-CM | POA: Diagnosis not present

## 2021-03-17 DIAGNOSIS — Z4932 Encounter for adequacy testing for peritoneal dialysis: Secondary | ICD-10-CM | POA: Diagnosis not present

## 2021-03-17 DIAGNOSIS — Z992 Dependence on renal dialysis: Secondary | ICD-10-CM | POA: Diagnosis not present

## 2021-03-17 DIAGNOSIS — N186 End stage renal disease: Secondary | ICD-10-CM | POA: Diagnosis not present

## 2021-03-17 DIAGNOSIS — N185 Chronic kidney disease, stage 5: Secondary | ICD-10-CM | POA: Diagnosis not present

## 2021-03-17 DIAGNOSIS — N2581 Secondary hyperparathyroidism of renal origin: Secondary | ICD-10-CM | POA: Diagnosis not present

## 2021-03-18 DIAGNOSIS — N185 Chronic kidney disease, stage 5: Secondary | ICD-10-CM | POA: Diagnosis not present

## 2021-03-18 DIAGNOSIS — N186 End stage renal disease: Secondary | ICD-10-CM | POA: Diagnosis not present

## 2021-03-18 DIAGNOSIS — N2581 Secondary hyperparathyroidism of renal origin: Secondary | ICD-10-CM | POA: Diagnosis not present

## 2021-03-18 DIAGNOSIS — Z992 Dependence on renal dialysis: Secondary | ICD-10-CM | POA: Diagnosis not present

## 2021-03-18 DIAGNOSIS — Z4932 Encounter for adequacy testing for peritoneal dialysis: Secondary | ICD-10-CM | POA: Diagnosis not present

## 2021-03-19 DIAGNOSIS — N185 Chronic kidney disease, stage 5: Secondary | ICD-10-CM | POA: Diagnosis not present

## 2021-03-19 DIAGNOSIS — N2581 Secondary hyperparathyroidism of renal origin: Secondary | ICD-10-CM | POA: Diagnosis not present

## 2021-03-19 DIAGNOSIS — N186 End stage renal disease: Secondary | ICD-10-CM | POA: Diagnosis not present

## 2021-03-19 DIAGNOSIS — Z992 Dependence on renal dialysis: Secondary | ICD-10-CM | POA: Diagnosis not present

## 2021-03-19 DIAGNOSIS — Z4932 Encounter for adequacy testing for peritoneal dialysis: Secondary | ICD-10-CM | POA: Diagnosis not present

## 2021-03-20 DIAGNOSIS — Z992 Dependence on renal dialysis: Secondary | ICD-10-CM | POA: Diagnosis not present

## 2021-03-20 DIAGNOSIS — Z4932 Encounter for adequacy testing for peritoneal dialysis: Secondary | ICD-10-CM | POA: Diagnosis not present

## 2021-03-20 DIAGNOSIS — N2581 Secondary hyperparathyroidism of renal origin: Secondary | ICD-10-CM | POA: Diagnosis not present

## 2021-03-20 DIAGNOSIS — N186 End stage renal disease: Secondary | ICD-10-CM | POA: Diagnosis not present

## 2021-03-20 DIAGNOSIS — N185 Chronic kidney disease, stage 5: Secondary | ICD-10-CM | POA: Diagnosis not present

## 2021-03-21 DIAGNOSIS — N2581 Secondary hyperparathyroidism of renal origin: Secondary | ICD-10-CM | POA: Diagnosis not present

## 2021-03-21 DIAGNOSIS — Z4932 Encounter for adequacy testing for peritoneal dialysis: Secondary | ICD-10-CM | POA: Diagnosis not present

## 2021-03-21 DIAGNOSIS — Z992 Dependence on renal dialysis: Secondary | ICD-10-CM | POA: Diagnosis not present

## 2021-03-21 DIAGNOSIS — N185 Chronic kidney disease, stage 5: Secondary | ICD-10-CM | POA: Diagnosis not present

## 2021-03-21 DIAGNOSIS — N186 End stage renal disease: Secondary | ICD-10-CM | POA: Diagnosis not present

## 2021-03-22 DIAGNOSIS — Z992 Dependence on renal dialysis: Secondary | ICD-10-CM | POA: Diagnosis not present

## 2021-03-22 DIAGNOSIS — N2581 Secondary hyperparathyroidism of renal origin: Secondary | ICD-10-CM | POA: Diagnosis not present

## 2021-03-22 DIAGNOSIS — N185 Chronic kidney disease, stage 5: Secondary | ICD-10-CM | POA: Diagnosis not present

## 2021-03-22 DIAGNOSIS — N186 End stage renal disease: Secondary | ICD-10-CM | POA: Diagnosis not present

## 2021-03-22 DIAGNOSIS — Z4932 Encounter for adequacy testing for peritoneal dialysis: Secondary | ICD-10-CM | POA: Diagnosis not present

## 2021-03-23 DIAGNOSIS — N2581 Secondary hyperparathyroidism of renal origin: Secondary | ICD-10-CM | POA: Diagnosis not present

## 2021-03-23 DIAGNOSIS — Z4932 Encounter for adequacy testing for peritoneal dialysis: Secondary | ICD-10-CM | POA: Diagnosis not present

## 2021-03-23 DIAGNOSIS — Z992 Dependence on renal dialysis: Secondary | ICD-10-CM | POA: Diagnosis not present

## 2021-03-23 DIAGNOSIS — N185 Chronic kidney disease, stage 5: Secondary | ICD-10-CM | POA: Diagnosis not present

## 2021-03-23 DIAGNOSIS — N186 End stage renal disease: Secondary | ICD-10-CM | POA: Diagnosis not present

## 2021-03-24 DIAGNOSIS — N2581 Secondary hyperparathyroidism of renal origin: Secondary | ICD-10-CM | POA: Diagnosis not present

## 2021-03-24 DIAGNOSIS — Z4932 Encounter for adequacy testing for peritoneal dialysis: Secondary | ICD-10-CM | POA: Diagnosis not present

## 2021-03-24 DIAGNOSIS — N186 End stage renal disease: Secondary | ICD-10-CM | POA: Diagnosis not present

## 2021-03-24 DIAGNOSIS — Z992 Dependence on renal dialysis: Secondary | ICD-10-CM | POA: Diagnosis not present

## 2021-03-24 DIAGNOSIS — N185 Chronic kidney disease, stage 5: Secondary | ICD-10-CM | POA: Diagnosis not present

## 2021-03-25 DIAGNOSIS — Z992 Dependence on renal dialysis: Secondary | ICD-10-CM | POA: Diagnosis not present

## 2021-03-25 DIAGNOSIS — Z4932 Encounter for adequacy testing for peritoneal dialysis: Secondary | ICD-10-CM | POA: Diagnosis not present

## 2021-03-25 DIAGNOSIS — N185 Chronic kidney disease, stage 5: Secondary | ICD-10-CM | POA: Diagnosis not present

## 2021-03-25 DIAGNOSIS — N186 End stage renal disease: Secondary | ICD-10-CM | POA: Diagnosis not present

## 2021-03-25 DIAGNOSIS — N2581 Secondary hyperparathyroidism of renal origin: Secondary | ICD-10-CM | POA: Diagnosis not present

## 2021-03-26 DIAGNOSIS — Z992 Dependence on renal dialysis: Secondary | ICD-10-CM | POA: Diagnosis not present

## 2021-03-26 DIAGNOSIS — N2581 Secondary hyperparathyroidism of renal origin: Secondary | ICD-10-CM | POA: Diagnosis not present

## 2021-03-26 DIAGNOSIS — N185 Chronic kidney disease, stage 5: Secondary | ICD-10-CM | POA: Diagnosis not present

## 2021-03-26 DIAGNOSIS — Z4932 Encounter for adequacy testing for peritoneal dialysis: Secondary | ICD-10-CM | POA: Diagnosis not present

## 2021-03-26 DIAGNOSIS — N186 End stage renal disease: Secondary | ICD-10-CM | POA: Diagnosis not present

## 2021-03-27 DIAGNOSIS — Z4932 Encounter for adequacy testing for peritoneal dialysis: Secondary | ICD-10-CM | POA: Diagnosis not present

## 2021-03-27 DIAGNOSIS — N186 End stage renal disease: Secondary | ICD-10-CM | POA: Diagnosis not present

## 2021-03-27 DIAGNOSIS — N2581 Secondary hyperparathyroidism of renal origin: Secondary | ICD-10-CM | POA: Diagnosis not present

## 2021-03-27 DIAGNOSIS — N185 Chronic kidney disease, stage 5: Secondary | ICD-10-CM | POA: Diagnosis not present

## 2021-03-27 DIAGNOSIS — Z992 Dependence on renal dialysis: Secondary | ICD-10-CM | POA: Diagnosis not present

## 2021-03-28 DIAGNOSIS — Z992 Dependence on renal dialysis: Secondary | ICD-10-CM | POA: Diagnosis not present

## 2021-03-28 DIAGNOSIS — N186 End stage renal disease: Secondary | ICD-10-CM | POA: Diagnosis not present

## 2021-03-28 DIAGNOSIS — Z4932 Encounter for adequacy testing for peritoneal dialysis: Secondary | ICD-10-CM | POA: Diagnosis not present

## 2021-03-28 DIAGNOSIS — N2581 Secondary hyperparathyroidism of renal origin: Secondary | ICD-10-CM | POA: Diagnosis not present

## 2021-03-28 DIAGNOSIS — N185 Chronic kidney disease, stage 5: Secondary | ICD-10-CM | POA: Diagnosis not present

## 2021-03-29 DIAGNOSIS — N2581 Secondary hyperparathyroidism of renal origin: Secondary | ICD-10-CM | POA: Diagnosis not present

## 2021-03-29 DIAGNOSIS — Z992 Dependence on renal dialysis: Secondary | ICD-10-CM | POA: Diagnosis not present

## 2021-03-29 DIAGNOSIS — N186 End stage renal disease: Secondary | ICD-10-CM | POA: Diagnosis not present

## 2021-03-29 DIAGNOSIS — Z4932 Encounter for adequacy testing for peritoneal dialysis: Secondary | ICD-10-CM | POA: Diagnosis not present

## 2021-03-29 DIAGNOSIS — N185 Chronic kidney disease, stage 5: Secondary | ICD-10-CM | POA: Diagnosis not present

## 2021-03-30 DIAGNOSIS — Z992 Dependence on renal dialysis: Secondary | ICD-10-CM | POA: Diagnosis not present

## 2021-03-30 DIAGNOSIS — Z4932 Encounter for adequacy testing for peritoneal dialysis: Secondary | ICD-10-CM | POA: Diagnosis not present

## 2021-03-30 DIAGNOSIS — N2581 Secondary hyperparathyroidism of renal origin: Secondary | ICD-10-CM | POA: Diagnosis not present

## 2021-03-30 DIAGNOSIS — N186 End stage renal disease: Secondary | ICD-10-CM | POA: Diagnosis not present

## 2021-03-30 DIAGNOSIS — N185 Chronic kidney disease, stage 5: Secondary | ICD-10-CM | POA: Diagnosis not present

## 2021-03-31 DIAGNOSIS — Z992 Dependence on renal dialysis: Secondary | ICD-10-CM | POA: Diagnosis not present

## 2021-03-31 DIAGNOSIS — Z4932 Encounter for adequacy testing for peritoneal dialysis: Secondary | ICD-10-CM | POA: Diagnosis not present

## 2021-03-31 DIAGNOSIS — N2581 Secondary hyperparathyroidism of renal origin: Secondary | ICD-10-CM | POA: Diagnosis not present

## 2021-03-31 DIAGNOSIS — N186 End stage renal disease: Secondary | ICD-10-CM | POA: Diagnosis not present

## 2021-03-31 DIAGNOSIS — N185 Chronic kidney disease, stage 5: Secondary | ICD-10-CM | POA: Diagnosis not present

## 2021-04-01 DIAGNOSIS — N185 Chronic kidney disease, stage 5: Secondary | ICD-10-CM | POA: Diagnosis not present

## 2021-04-01 DIAGNOSIS — Z4932 Encounter for adequacy testing for peritoneal dialysis: Secondary | ICD-10-CM | POA: Diagnosis not present

## 2021-04-01 DIAGNOSIS — N2581 Secondary hyperparathyroidism of renal origin: Secondary | ICD-10-CM | POA: Diagnosis not present

## 2021-04-01 DIAGNOSIS — N186 End stage renal disease: Secondary | ICD-10-CM | POA: Diagnosis not present

## 2021-04-01 DIAGNOSIS — Z992 Dependence on renal dialysis: Secondary | ICD-10-CM | POA: Diagnosis not present

## 2021-04-02 DIAGNOSIS — N185 Chronic kidney disease, stage 5: Secondary | ICD-10-CM | POA: Diagnosis not present

## 2021-04-02 DIAGNOSIS — N2581 Secondary hyperparathyroidism of renal origin: Secondary | ICD-10-CM | POA: Diagnosis not present

## 2021-04-02 DIAGNOSIS — Z992 Dependence on renal dialysis: Secondary | ICD-10-CM | POA: Diagnosis not present

## 2021-04-02 DIAGNOSIS — Z4932 Encounter for adequacy testing for peritoneal dialysis: Secondary | ICD-10-CM | POA: Diagnosis not present

## 2021-04-02 DIAGNOSIS — N186 End stage renal disease: Secondary | ICD-10-CM | POA: Diagnosis not present

## 2021-04-03 DIAGNOSIS — Z4932 Encounter for adequacy testing for peritoneal dialysis: Secondary | ICD-10-CM | POA: Diagnosis not present

## 2021-04-03 DIAGNOSIS — N2581 Secondary hyperparathyroidism of renal origin: Secondary | ICD-10-CM | POA: Diagnosis not present

## 2021-04-03 DIAGNOSIS — N186 End stage renal disease: Secondary | ICD-10-CM | POA: Diagnosis not present

## 2021-04-03 DIAGNOSIS — Z992 Dependence on renal dialysis: Secondary | ICD-10-CM | POA: Diagnosis not present

## 2021-04-03 DIAGNOSIS — N185 Chronic kidney disease, stage 5: Secondary | ICD-10-CM | POA: Diagnosis not present

## 2021-04-04 DIAGNOSIS — N185 Chronic kidney disease, stage 5: Secondary | ICD-10-CM | POA: Diagnosis not present

## 2021-04-04 DIAGNOSIS — Z4932 Encounter for adequacy testing for peritoneal dialysis: Secondary | ICD-10-CM | POA: Diagnosis not present

## 2021-04-04 DIAGNOSIS — N2581 Secondary hyperparathyroidism of renal origin: Secondary | ICD-10-CM | POA: Diagnosis not present

## 2021-04-04 DIAGNOSIS — Z992 Dependence on renal dialysis: Secondary | ICD-10-CM | POA: Diagnosis not present

## 2021-04-04 DIAGNOSIS — N186 End stage renal disease: Secondary | ICD-10-CM | POA: Diagnosis not present

## 2021-04-05 DIAGNOSIS — N185 Chronic kidney disease, stage 5: Secondary | ICD-10-CM | POA: Diagnosis not present

## 2021-04-05 DIAGNOSIS — Z992 Dependence on renal dialysis: Secondary | ICD-10-CM | POA: Diagnosis not present

## 2021-04-05 DIAGNOSIS — N2581 Secondary hyperparathyroidism of renal origin: Secondary | ICD-10-CM | POA: Diagnosis not present

## 2021-04-05 DIAGNOSIS — N186 End stage renal disease: Secondary | ICD-10-CM | POA: Diagnosis not present

## 2021-04-05 DIAGNOSIS — Z4932 Encounter for adequacy testing for peritoneal dialysis: Secondary | ICD-10-CM | POA: Diagnosis not present

## 2021-04-06 DIAGNOSIS — Z4932 Encounter for adequacy testing for peritoneal dialysis: Secondary | ICD-10-CM | POA: Diagnosis not present

## 2021-04-06 DIAGNOSIS — N185 Chronic kidney disease, stage 5: Secondary | ICD-10-CM | POA: Diagnosis not present

## 2021-04-06 DIAGNOSIS — Z992 Dependence on renal dialysis: Secondary | ICD-10-CM | POA: Diagnosis not present

## 2021-04-06 DIAGNOSIS — N186 End stage renal disease: Secondary | ICD-10-CM | POA: Diagnosis not present

## 2021-04-06 DIAGNOSIS — N2581 Secondary hyperparathyroidism of renal origin: Secondary | ICD-10-CM | POA: Diagnosis not present

## 2021-04-07 DIAGNOSIS — Z992 Dependence on renal dialysis: Secondary | ICD-10-CM | POA: Diagnosis not present

## 2021-04-07 DIAGNOSIS — N2581 Secondary hyperparathyroidism of renal origin: Secondary | ICD-10-CM | POA: Diagnosis not present

## 2021-04-07 DIAGNOSIS — Z4932 Encounter for adequacy testing for peritoneal dialysis: Secondary | ICD-10-CM | POA: Diagnosis not present

## 2021-04-07 DIAGNOSIS — N186 End stage renal disease: Secondary | ICD-10-CM | POA: Diagnosis not present

## 2021-04-07 DIAGNOSIS — N185 Chronic kidney disease, stage 5: Secondary | ICD-10-CM | POA: Diagnosis not present

## 2021-04-08 DIAGNOSIS — N2581 Secondary hyperparathyroidism of renal origin: Secondary | ICD-10-CM | POA: Diagnosis not present

## 2021-04-08 DIAGNOSIS — N186 End stage renal disease: Secondary | ICD-10-CM | POA: Diagnosis not present

## 2021-04-08 DIAGNOSIS — Z992 Dependence on renal dialysis: Secondary | ICD-10-CM | POA: Diagnosis not present

## 2021-04-08 DIAGNOSIS — N185 Chronic kidney disease, stage 5: Secondary | ICD-10-CM | POA: Diagnosis not present

## 2021-04-08 DIAGNOSIS — Z4932 Encounter for adequacy testing for peritoneal dialysis: Secondary | ICD-10-CM | POA: Diagnosis not present

## 2021-04-09 DIAGNOSIS — Z992 Dependence on renal dialysis: Secondary | ICD-10-CM | POA: Diagnosis not present

## 2021-04-09 DIAGNOSIS — N186 End stage renal disease: Secondary | ICD-10-CM | POA: Diagnosis not present

## 2021-04-09 DIAGNOSIS — N2581 Secondary hyperparathyroidism of renal origin: Secondary | ICD-10-CM | POA: Diagnosis not present

## 2021-04-09 DIAGNOSIS — Z4932 Encounter for adequacy testing for peritoneal dialysis: Secondary | ICD-10-CM | POA: Diagnosis not present

## 2021-04-09 DIAGNOSIS — N185 Chronic kidney disease, stage 5: Secondary | ICD-10-CM | POA: Diagnosis not present

## 2021-04-10 DIAGNOSIS — N2581 Secondary hyperparathyroidism of renal origin: Secondary | ICD-10-CM | POA: Diagnosis not present

## 2021-04-10 DIAGNOSIS — N185 Chronic kidney disease, stage 5: Secondary | ICD-10-CM | POA: Diagnosis not present

## 2021-04-10 DIAGNOSIS — Z992 Dependence on renal dialysis: Secondary | ICD-10-CM | POA: Diagnosis not present

## 2021-04-10 DIAGNOSIS — Z4932 Encounter for adequacy testing for peritoneal dialysis: Secondary | ICD-10-CM | POA: Diagnosis not present

## 2021-04-10 DIAGNOSIS — N186 End stage renal disease: Secondary | ICD-10-CM | POA: Diagnosis not present

## 2021-04-11 DIAGNOSIS — N2581 Secondary hyperparathyroidism of renal origin: Secondary | ICD-10-CM | POA: Diagnosis not present

## 2021-04-11 DIAGNOSIS — Z4932 Encounter for adequacy testing for peritoneal dialysis: Secondary | ICD-10-CM | POA: Diagnosis not present

## 2021-04-11 DIAGNOSIS — N185 Chronic kidney disease, stage 5: Secondary | ICD-10-CM | POA: Diagnosis not present

## 2021-04-11 DIAGNOSIS — Z992 Dependence on renal dialysis: Secondary | ICD-10-CM | POA: Diagnosis not present

## 2021-04-11 DIAGNOSIS — N186 End stage renal disease: Secondary | ICD-10-CM | POA: Diagnosis not present

## 2021-04-11 DIAGNOSIS — I129 Hypertensive chronic kidney disease with stage 1 through stage 4 chronic kidney disease, or unspecified chronic kidney disease: Secondary | ICD-10-CM | POA: Diagnosis not present

## 2021-04-12 DIAGNOSIS — Z992 Dependence on renal dialysis: Secondary | ICD-10-CM | POA: Diagnosis not present

## 2021-04-12 DIAGNOSIS — D631 Anemia in chronic kidney disease: Secondary | ICD-10-CM | POA: Diagnosis not present

## 2021-04-12 DIAGNOSIS — N186 End stage renal disease: Secondary | ICD-10-CM | POA: Diagnosis not present

## 2021-04-12 DIAGNOSIS — Z4932 Encounter for adequacy testing for peritoneal dialysis: Secondary | ICD-10-CM | POA: Diagnosis not present

## 2021-04-12 DIAGNOSIS — D509 Iron deficiency anemia, unspecified: Secondary | ICD-10-CM | POA: Diagnosis not present

## 2021-04-12 DIAGNOSIS — Z23 Encounter for immunization: Secondary | ICD-10-CM | POA: Diagnosis not present

## 2021-04-12 DIAGNOSIS — N2581 Secondary hyperparathyroidism of renal origin: Secondary | ICD-10-CM | POA: Diagnosis not present

## 2021-04-13 DIAGNOSIS — Z4932 Encounter for adequacy testing for peritoneal dialysis: Secondary | ICD-10-CM | POA: Diagnosis not present

## 2021-04-13 DIAGNOSIS — N186 End stage renal disease: Secondary | ICD-10-CM | POA: Diagnosis not present

## 2021-04-13 DIAGNOSIS — D509 Iron deficiency anemia, unspecified: Secondary | ICD-10-CM | POA: Diagnosis not present

## 2021-04-13 DIAGNOSIS — N2581 Secondary hyperparathyroidism of renal origin: Secondary | ICD-10-CM | POA: Diagnosis not present

## 2021-04-13 DIAGNOSIS — Z23 Encounter for immunization: Secondary | ICD-10-CM | POA: Diagnosis not present

## 2021-04-13 DIAGNOSIS — Z992 Dependence on renal dialysis: Secondary | ICD-10-CM | POA: Diagnosis not present

## 2021-04-13 DIAGNOSIS — D631 Anemia in chronic kidney disease: Secondary | ICD-10-CM | POA: Diagnosis not present

## 2021-04-14 DIAGNOSIS — Z4932 Encounter for adequacy testing for peritoneal dialysis: Secondary | ICD-10-CM | POA: Diagnosis not present

## 2021-04-14 DIAGNOSIS — Z992 Dependence on renal dialysis: Secondary | ICD-10-CM | POA: Diagnosis not present

## 2021-04-14 DIAGNOSIS — D631 Anemia in chronic kidney disease: Secondary | ICD-10-CM | POA: Diagnosis not present

## 2021-04-14 DIAGNOSIS — N186 End stage renal disease: Secondary | ICD-10-CM | POA: Diagnosis not present

## 2021-04-14 DIAGNOSIS — Z23 Encounter for immunization: Secondary | ICD-10-CM | POA: Diagnosis not present

## 2021-04-14 DIAGNOSIS — N2581 Secondary hyperparathyroidism of renal origin: Secondary | ICD-10-CM | POA: Diagnosis not present

## 2021-04-14 DIAGNOSIS — D509 Iron deficiency anemia, unspecified: Secondary | ICD-10-CM | POA: Diagnosis not present

## 2021-04-15 DIAGNOSIS — Z992 Dependence on renal dialysis: Secondary | ICD-10-CM | POA: Diagnosis not present

## 2021-04-15 DIAGNOSIS — N186 End stage renal disease: Secondary | ICD-10-CM | POA: Diagnosis not present

## 2021-04-15 DIAGNOSIS — N2581 Secondary hyperparathyroidism of renal origin: Secondary | ICD-10-CM | POA: Diagnosis not present

## 2021-04-15 DIAGNOSIS — Z23 Encounter for immunization: Secondary | ICD-10-CM | POA: Diagnosis not present

## 2021-04-15 DIAGNOSIS — Z4932 Encounter for adequacy testing for peritoneal dialysis: Secondary | ICD-10-CM | POA: Diagnosis not present

## 2021-04-15 DIAGNOSIS — D631 Anemia in chronic kidney disease: Secondary | ICD-10-CM | POA: Diagnosis not present

## 2021-04-15 DIAGNOSIS — D509 Iron deficiency anemia, unspecified: Secondary | ICD-10-CM | POA: Diagnosis not present

## 2021-04-16 DIAGNOSIS — Z23 Encounter for immunization: Secondary | ICD-10-CM | POA: Diagnosis not present

## 2021-04-16 DIAGNOSIS — Z4932 Encounter for adequacy testing for peritoneal dialysis: Secondary | ICD-10-CM | POA: Diagnosis not present

## 2021-04-16 DIAGNOSIS — D509 Iron deficiency anemia, unspecified: Secondary | ICD-10-CM | POA: Diagnosis not present

## 2021-04-16 DIAGNOSIS — Z992 Dependence on renal dialysis: Secondary | ICD-10-CM | POA: Diagnosis not present

## 2021-04-16 DIAGNOSIS — D631 Anemia in chronic kidney disease: Secondary | ICD-10-CM | POA: Diagnosis not present

## 2021-04-16 DIAGNOSIS — N186 End stage renal disease: Secondary | ICD-10-CM | POA: Diagnosis not present

## 2021-04-16 DIAGNOSIS — N2581 Secondary hyperparathyroidism of renal origin: Secondary | ICD-10-CM | POA: Diagnosis not present

## 2021-04-17 DIAGNOSIS — D631 Anemia in chronic kidney disease: Secondary | ICD-10-CM | POA: Diagnosis not present

## 2021-04-17 DIAGNOSIS — Z992 Dependence on renal dialysis: Secondary | ICD-10-CM | POA: Diagnosis not present

## 2021-04-17 DIAGNOSIS — D509 Iron deficiency anemia, unspecified: Secondary | ICD-10-CM | POA: Diagnosis not present

## 2021-04-17 DIAGNOSIS — Z23 Encounter for immunization: Secondary | ICD-10-CM | POA: Diagnosis not present

## 2021-04-17 DIAGNOSIS — Z4932 Encounter for adequacy testing for peritoneal dialysis: Secondary | ICD-10-CM | POA: Diagnosis not present

## 2021-04-17 DIAGNOSIS — N186 End stage renal disease: Secondary | ICD-10-CM | POA: Diagnosis not present

## 2021-04-17 DIAGNOSIS — N2581 Secondary hyperparathyroidism of renal origin: Secondary | ICD-10-CM | POA: Diagnosis not present

## 2021-04-18 DIAGNOSIS — Z4932 Encounter for adequacy testing for peritoneal dialysis: Secondary | ICD-10-CM | POA: Diagnosis not present

## 2021-04-18 DIAGNOSIS — D631 Anemia in chronic kidney disease: Secondary | ICD-10-CM | POA: Diagnosis not present

## 2021-04-18 DIAGNOSIS — N186 End stage renal disease: Secondary | ICD-10-CM | POA: Diagnosis not present

## 2021-04-18 DIAGNOSIS — N2581 Secondary hyperparathyroidism of renal origin: Secondary | ICD-10-CM | POA: Diagnosis not present

## 2021-04-18 DIAGNOSIS — Z992 Dependence on renal dialysis: Secondary | ICD-10-CM | POA: Diagnosis not present

## 2021-04-18 DIAGNOSIS — Z23 Encounter for immunization: Secondary | ICD-10-CM | POA: Diagnosis not present

## 2021-04-18 DIAGNOSIS — D509 Iron deficiency anemia, unspecified: Secondary | ICD-10-CM | POA: Diagnosis not present

## 2021-04-19 DIAGNOSIS — Z4932 Encounter for adequacy testing for peritoneal dialysis: Secondary | ICD-10-CM | POA: Diagnosis not present

## 2021-04-19 DIAGNOSIS — D509 Iron deficiency anemia, unspecified: Secondary | ICD-10-CM | POA: Diagnosis not present

## 2021-04-19 DIAGNOSIS — Z992 Dependence on renal dialysis: Secondary | ICD-10-CM | POA: Diagnosis not present

## 2021-04-19 DIAGNOSIS — Z23 Encounter for immunization: Secondary | ICD-10-CM | POA: Diagnosis not present

## 2021-04-19 DIAGNOSIS — N2581 Secondary hyperparathyroidism of renal origin: Secondary | ICD-10-CM | POA: Diagnosis not present

## 2021-04-19 DIAGNOSIS — D631 Anemia in chronic kidney disease: Secondary | ICD-10-CM | POA: Diagnosis not present

## 2021-04-19 DIAGNOSIS — N186 End stage renal disease: Secondary | ICD-10-CM | POA: Diagnosis not present

## 2021-04-20 DIAGNOSIS — D509 Iron deficiency anemia, unspecified: Secondary | ICD-10-CM | POA: Diagnosis not present

## 2021-04-20 DIAGNOSIS — N186 End stage renal disease: Secondary | ICD-10-CM | POA: Diagnosis not present

## 2021-04-20 DIAGNOSIS — Z23 Encounter for immunization: Secondary | ICD-10-CM | POA: Diagnosis not present

## 2021-04-20 DIAGNOSIS — N2581 Secondary hyperparathyroidism of renal origin: Secondary | ICD-10-CM | POA: Diagnosis not present

## 2021-04-20 DIAGNOSIS — D631 Anemia in chronic kidney disease: Secondary | ICD-10-CM | POA: Diagnosis not present

## 2021-04-20 DIAGNOSIS — Z992 Dependence on renal dialysis: Secondary | ICD-10-CM | POA: Diagnosis not present

## 2021-04-20 DIAGNOSIS — Z4932 Encounter for adequacy testing for peritoneal dialysis: Secondary | ICD-10-CM | POA: Diagnosis not present

## 2021-04-21 DIAGNOSIS — D509 Iron deficiency anemia, unspecified: Secondary | ICD-10-CM | POA: Diagnosis not present

## 2021-04-21 DIAGNOSIS — Z23 Encounter for immunization: Secondary | ICD-10-CM | POA: Diagnosis not present

## 2021-04-21 DIAGNOSIS — Z992 Dependence on renal dialysis: Secondary | ICD-10-CM | POA: Diagnosis not present

## 2021-04-21 DIAGNOSIS — D631 Anemia in chronic kidney disease: Secondary | ICD-10-CM | POA: Diagnosis not present

## 2021-04-21 DIAGNOSIS — N186 End stage renal disease: Secondary | ICD-10-CM | POA: Diagnosis not present

## 2021-04-21 DIAGNOSIS — N2581 Secondary hyperparathyroidism of renal origin: Secondary | ICD-10-CM | POA: Diagnosis not present

## 2021-04-21 DIAGNOSIS — Z4932 Encounter for adequacy testing for peritoneal dialysis: Secondary | ICD-10-CM | POA: Diagnosis not present

## 2021-04-22 DIAGNOSIS — Z4932 Encounter for adequacy testing for peritoneal dialysis: Secondary | ICD-10-CM | POA: Diagnosis not present

## 2021-04-22 DIAGNOSIS — Z23 Encounter for immunization: Secondary | ICD-10-CM | POA: Diagnosis not present

## 2021-04-22 DIAGNOSIS — N186 End stage renal disease: Secondary | ICD-10-CM | POA: Diagnosis not present

## 2021-04-22 DIAGNOSIS — D631 Anemia in chronic kidney disease: Secondary | ICD-10-CM | POA: Diagnosis not present

## 2021-04-22 DIAGNOSIS — N2581 Secondary hyperparathyroidism of renal origin: Secondary | ICD-10-CM | POA: Diagnosis not present

## 2021-04-22 DIAGNOSIS — D509 Iron deficiency anemia, unspecified: Secondary | ICD-10-CM | POA: Diagnosis not present

## 2021-04-22 DIAGNOSIS — Z992 Dependence on renal dialysis: Secondary | ICD-10-CM | POA: Diagnosis not present

## 2021-04-23 DIAGNOSIS — N2581 Secondary hyperparathyroidism of renal origin: Secondary | ICD-10-CM | POA: Diagnosis not present

## 2021-04-23 DIAGNOSIS — Z992 Dependence on renal dialysis: Secondary | ICD-10-CM | POA: Diagnosis not present

## 2021-04-23 DIAGNOSIS — D631 Anemia in chronic kidney disease: Secondary | ICD-10-CM | POA: Diagnosis not present

## 2021-04-23 DIAGNOSIS — D509 Iron deficiency anemia, unspecified: Secondary | ICD-10-CM | POA: Diagnosis not present

## 2021-04-23 DIAGNOSIS — Z23 Encounter for immunization: Secondary | ICD-10-CM | POA: Diagnosis not present

## 2021-04-23 DIAGNOSIS — Z4932 Encounter for adequacy testing for peritoneal dialysis: Secondary | ICD-10-CM | POA: Diagnosis not present

## 2021-04-23 DIAGNOSIS — N186 End stage renal disease: Secondary | ICD-10-CM | POA: Diagnosis not present

## 2021-04-24 DIAGNOSIS — D509 Iron deficiency anemia, unspecified: Secondary | ICD-10-CM | POA: Diagnosis not present

## 2021-04-24 DIAGNOSIS — Z4932 Encounter for adequacy testing for peritoneal dialysis: Secondary | ICD-10-CM | POA: Diagnosis not present

## 2021-04-24 DIAGNOSIS — N2581 Secondary hyperparathyroidism of renal origin: Secondary | ICD-10-CM | POA: Diagnosis not present

## 2021-04-24 DIAGNOSIS — Z23 Encounter for immunization: Secondary | ICD-10-CM | POA: Diagnosis not present

## 2021-04-24 DIAGNOSIS — D631 Anemia in chronic kidney disease: Secondary | ICD-10-CM | POA: Diagnosis not present

## 2021-04-24 DIAGNOSIS — N186 End stage renal disease: Secondary | ICD-10-CM | POA: Diagnosis not present

## 2021-04-24 DIAGNOSIS — Z992 Dependence on renal dialysis: Secondary | ICD-10-CM | POA: Diagnosis not present

## 2021-04-25 DIAGNOSIS — D509 Iron deficiency anemia, unspecified: Secondary | ICD-10-CM | POA: Diagnosis not present

## 2021-04-25 DIAGNOSIS — D631 Anemia in chronic kidney disease: Secondary | ICD-10-CM | POA: Diagnosis not present

## 2021-04-25 DIAGNOSIS — Z23 Encounter for immunization: Secondary | ICD-10-CM | POA: Diagnosis not present

## 2021-04-25 DIAGNOSIS — N2581 Secondary hyperparathyroidism of renal origin: Secondary | ICD-10-CM | POA: Diagnosis not present

## 2021-04-25 DIAGNOSIS — Z992 Dependence on renal dialysis: Secondary | ICD-10-CM | POA: Diagnosis not present

## 2021-04-25 DIAGNOSIS — N186 End stage renal disease: Secondary | ICD-10-CM | POA: Diagnosis not present

## 2021-04-25 DIAGNOSIS — Z4932 Encounter for adequacy testing for peritoneal dialysis: Secondary | ICD-10-CM | POA: Diagnosis not present

## 2021-04-26 DIAGNOSIS — D509 Iron deficiency anemia, unspecified: Secondary | ICD-10-CM | POA: Diagnosis not present

## 2021-04-26 DIAGNOSIS — N2581 Secondary hyperparathyroidism of renal origin: Secondary | ICD-10-CM | POA: Diagnosis not present

## 2021-04-26 DIAGNOSIS — N186 End stage renal disease: Secondary | ICD-10-CM | POA: Diagnosis not present

## 2021-04-26 DIAGNOSIS — D631 Anemia in chronic kidney disease: Secondary | ICD-10-CM | POA: Diagnosis not present

## 2021-04-26 DIAGNOSIS — Z4932 Encounter for adequacy testing for peritoneal dialysis: Secondary | ICD-10-CM | POA: Diagnosis not present

## 2021-04-26 DIAGNOSIS — Z23 Encounter for immunization: Secondary | ICD-10-CM | POA: Diagnosis not present

## 2021-04-26 DIAGNOSIS — Z992 Dependence on renal dialysis: Secondary | ICD-10-CM | POA: Diagnosis not present

## 2021-04-27 DIAGNOSIS — Z23 Encounter for immunization: Secondary | ICD-10-CM | POA: Diagnosis not present

## 2021-04-27 DIAGNOSIS — Z4932 Encounter for adequacy testing for peritoneal dialysis: Secondary | ICD-10-CM | POA: Diagnosis not present

## 2021-04-27 DIAGNOSIS — Z992 Dependence on renal dialysis: Secondary | ICD-10-CM | POA: Diagnosis not present

## 2021-04-27 DIAGNOSIS — N2581 Secondary hyperparathyroidism of renal origin: Secondary | ICD-10-CM | POA: Diagnosis not present

## 2021-04-27 DIAGNOSIS — N186 End stage renal disease: Secondary | ICD-10-CM | POA: Diagnosis not present

## 2021-04-27 DIAGNOSIS — D631 Anemia in chronic kidney disease: Secondary | ICD-10-CM | POA: Diagnosis not present

## 2021-04-27 DIAGNOSIS — D509 Iron deficiency anemia, unspecified: Secondary | ICD-10-CM | POA: Diagnosis not present

## 2021-04-28 DIAGNOSIS — N2581 Secondary hyperparathyroidism of renal origin: Secondary | ICD-10-CM | POA: Diagnosis not present

## 2021-04-28 DIAGNOSIS — Z992 Dependence on renal dialysis: Secondary | ICD-10-CM | POA: Diagnosis not present

## 2021-04-28 DIAGNOSIS — Z23 Encounter for immunization: Secondary | ICD-10-CM | POA: Diagnosis not present

## 2021-04-28 DIAGNOSIS — Z4932 Encounter for adequacy testing for peritoneal dialysis: Secondary | ICD-10-CM | POA: Diagnosis not present

## 2021-04-28 DIAGNOSIS — D509 Iron deficiency anemia, unspecified: Secondary | ICD-10-CM | POA: Diagnosis not present

## 2021-04-28 DIAGNOSIS — N186 End stage renal disease: Secondary | ICD-10-CM | POA: Diagnosis not present

## 2021-04-28 DIAGNOSIS — D631 Anemia in chronic kidney disease: Secondary | ICD-10-CM | POA: Diagnosis not present

## 2021-04-29 DIAGNOSIS — D631 Anemia in chronic kidney disease: Secondary | ICD-10-CM | POA: Diagnosis not present

## 2021-04-29 DIAGNOSIS — Z4932 Encounter for adequacy testing for peritoneal dialysis: Secondary | ICD-10-CM | POA: Diagnosis not present

## 2021-04-29 DIAGNOSIS — D509 Iron deficiency anemia, unspecified: Secondary | ICD-10-CM | POA: Diagnosis not present

## 2021-04-29 DIAGNOSIS — Z23 Encounter for immunization: Secondary | ICD-10-CM | POA: Diagnosis not present

## 2021-04-29 DIAGNOSIS — N2581 Secondary hyperparathyroidism of renal origin: Secondary | ICD-10-CM | POA: Diagnosis not present

## 2021-04-29 DIAGNOSIS — N186 End stage renal disease: Secondary | ICD-10-CM | POA: Diagnosis not present

## 2021-04-29 DIAGNOSIS — Z992 Dependence on renal dialysis: Secondary | ICD-10-CM | POA: Diagnosis not present

## 2021-04-30 DIAGNOSIS — D631 Anemia in chronic kidney disease: Secondary | ICD-10-CM | POA: Diagnosis not present

## 2021-04-30 DIAGNOSIS — Z992 Dependence on renal dialysis: Secondary | ICD-10-CM | POA: Diagnosis not present

## 2021-04-30 DIAGNOSIS — N2581 Secondary hyperparathyroidism of renal origin: Secondary | ICD-10-CM | POA: Diagnosis not present

## 2021-04-30 DIAGNOSIS — D509 Iron deficiency anemia, unspecified: Secondary | ICD-10-CM | POA: Diagnosis not present

## 2021-04-30 DIAGNOSIS — Z4932 Encounter for adequacy testing for peritoneal dialysis: Secondary | ICD-10-CM | POA: Diagnosis not present

## 2021-04-30 DIAGNOSIS — Z23 Encounter for immunization: Secondary | ICD-10-CM | POA: Diagnosis not present

## 2021-04-30 DIAGNOSIS — N186 End stage renal disease: Secondary | ICD-10-CM | POA: Diagnosis not present

## 2021-05-01 DIAGNOSIS — D509 Iron deficiency anemia, unspecified: Secondary | ICD-10-CM | POA: Diagnosis not present

## 2021-05-01 DIAGNOSIS — D631 Anemia in chronic kidney disease: Secondary | ICD-10-CM | POA: Diagnosis not present

## 2021-05-01 DIAGNOSIS — Z992 Dependence on renal dialysis: Secondary | ICD-10-CM | POA: Diagnosis not present

## 2021-05-01 DIAGNOSIS — N2581 Secondary hyperparathyroidism of renal origin: Secondary | ICD-10-CM | POA: Diagnosis not present

## 2021-05-01 DIAGNOSIS — Z23 Encounter for immunization: Secondary | ICD-10-CM | POA: Diagnosis not present

## 2021-05-01 DIAGNOSIS — N186 End stage renal disease: Secondary | ICD-10-CM | POA: Diagnosis not present

## 2021-05-01 DIAGNOSIS — Z4932 Encounter for adequacy testing for peritoneal dialysis: Secondary | ICD-10-CM | POA: Diagnosis not present

## 2021-05-02 DIAGNOSIS — D631 Anemia in chronic kidney disease: Secondary | ICD-10-CM | POA: Diagnosis not present

## 2021-05-02 DIAGNOSIS — N2581 Secondary hyperparathyroidism of renal origin: Secondary | ICD-10-CM | POA: Diagnosis not present

## 2021-05-02 DIAGNOSIS — Z23 Encounter for immunization: Secondary | ICD-10-CM | POA: Diagnosis not present

## 2021-05-02 DIAGNOSIS — Z992 Dependence on renal dialysis: Secondary | ICD-10-CM | POA: Diagnosis not present

## 2021-05-02 DIAGNOSIS — N186 End stage renal disease: Secondary | ICD-10-CM | POA: Diagnosis not present

## 2021-05-02 DIAGNOSIS — D509 Iron deficiency anemia, unspecified: Secondary | ICD-10-CM | POA: Diagnosis not present

## 2021-05-02 DIAGNOSIS — Z4932 Encounter for adequacy testing for peritoneal dialysis: Secondary | ICD-10-CM | POA: Diagnosis not present

## 2021-05-03 DIAGNOSIS — Z23 Encounter for immunization: Secondary | ICD-10-CM | POA: Diagnosis not present

## 2021-05-03 DIAGNOSIS — Z992 Dependence on renal dialysis: Secondary | ICD-10-CM | POA: Diagnosis not present

## 2021-05-03 DIAGNOSIS — D631 Anemia in chronic kidney disease: Secondary | ICD-10-CM | POA: Diagnosis not present

## 2021-05-03 DIAGNOSIS — D509 Iron deficiency anemia, unspecified: Secondary | ICD-10-CM | POA: Diagnosis not present

## 2021-05-03 DIAGNOSIS — Z4932 Encounter for adequacy testing for peritoneal dialysis: Secondary | ICD-10-CM | POA: Diagnosis not present

## 2021-05-03 DIAGNOSIS — N2581 Secondary hyperparathyroidism of renal origin: Secondary | ICD-10-CM | POA: Diagnosis not present

## 2021-05-03 DIAGNOSIS — N186 End stage renal disease: Secondary | ICD-10-CM | POA: Diagnosis not present

## 2021-05-04 DIAGNOSIS — N2581 Secondary hyperparathyroidism of renal origin: Secondary | ICD-10-CM | POA: Diagnosis not present

## 2021-05-04 DIAGNOSIS — D631 Anemia in chronic kidney disease: Secondary | ICD-10-CM | POA: Diagnosis not present

## 2021-05-04 DIAGNOSIS — N186 End stage renal disease: Secondary | ICD-10-CM | POA: Diagnosis not present

## 2021-05-04 DIAGNOSIS — Z4932 Encounter for adequacy testing for peritoneal dialysis: Secondary | ICD-10-CM | POA: Diagnosis not present

## 2021-05-04 DIAGNOSIS — D509 Iron deficiency anemia, unspecified: Secondary | ICD-10-CM | POA: Diagnosis not present

## 2021-05-04 DIAGNOSIS — Z992 Dependence on renal dialysis: Secondary | ICD-10-CM | POA: Diagnosis not present

## 2021-05-04 DIAGNOSIS — Z23 Encounter for immunization: Secondary | ICD-10-CM | POA: Diagnosis not present

## 2021-05-05 DIAGNOSIS — D631 Anemia in chronic kidney disease: Secondary | ICD-10-CM | POA: Diagnosis not present

## 2021-05-05 DIAGNOSIS — Z23 Encounter for immunization: Secondary | ICD-10-CM | POA: Diagnosis not present

## 2021-05-05 DIAGNOSIS — Z4932 Encounter for adequacy testing for peritoneal dialysis: Secondary | ICD-10-CM | POA: Diagnosis not present

## 2021-05-05 DIAGNOSIS — Z992 Dependence on renal dialysis: Secondary | ICD-10-CM | POA: Diagnosis not present

## 2021-05-05 DIAGNOSIS — D509 Iron deficiency anemia, unspecified: Secondary | ICD-10-CM | POA: Diagnosis not present

## 2021-05-05 DIAGNOSIS — N2581 Secondary hyperparathyroidism of renal origin: Secondary | ICD-10-CM | POA: Diagnosis not present

## 2021-05-05 DIAGNOSIS — N186 End stage renal disease: Secondary | ICD-10-CM | POA: Diagnosis not present

## 2021-05-06 DIAGNOSIS — Z992 Dependence on renal dialysis: Secondary | ICD-10-CM | POA: Diagnosis not present

## 2021-05-06 DIAGNOSIS — D631 Anemia in chronic kidney disease: Secondary | ICD-10-CM | POA: Diagnosis not present

## 2021-05-06 DIAGNOSIS — Z23 Encounter for immunization: Secondary | ICD-10-CM | POA: Diagnosis not present

## 2021-05-06 DIAGNOSIS — D509 Iron deficiency anemia, unspecified: Secondary | ICD-10-CM | POA: Diagnosis not present

## 2021-05-06 DIAGNOSIS — N2581 Secondary hyperparathyroidism of renal origin: Secondary | ICD-10-CM | POA: Diagnosis not present

## 2021-05-06 DIAGNOSIS — Z4932 Encounter for adequacy testing for peritoneal dialysis: Secondary | ICD-10-CM | POA: Diagnosis not present

## 2021-05-06 DIAGNOSIS — N186 End stage renal disease: Secondary | ICD-10-CM | POA: Diagnosis not present

## 2021-05-07 DIAGNOSIS — Z23 Encounter for immunization: Secondary | ICD-10-CM | POA: Diagnosis not present

## 2021-05-07 DIAGNOSIS — Z992 Dependence on renal dialysis: Secondary | ICD-10-CM | POA: Diagnosis not present

## 2021-05-07 DIAGNOSIS — N186 End stage renal disease: Secondary | ICD-10-CM | POA: Diagnosis not present

## 2021-05-07 DIAGNOSIS — D631 Anemia in chronic kidney disease: Secondary | ICD-10-CM | POA: Diagnosis not present

## 2021-05-07 DIAGNOSIS — D509 Iron deficiency anemia, unspecified: Secondary | ICD-10-CM | POA: Diagnosis not present

## 2021-05-07 DIAGNOSIS — N2581 Secondary hyperparathyroidism of renal origin: Secondary | ICD-10-CM | POA: Diagnosis not present

## 2021-05-07 DIAGNOSIS — Z4932 Encounter for adequacy testing for peritoneal dialysis: Secondary | ICD-10-CM | POA: Diagnosis not present

## 2021-05-08 DIAGNOSIS — D509 Iron deficiency anemia, unspecified: Secondary | ICD-10-CM | POA: Diagnosis not present

## 2021-05-08 DIAGNOSIS — N186 End stage renal disease: Secondary | ICD-10-CM | POA: Diagnosis not present

## 2021-05-08 DIAGNOSIS — Z4932 Encounter for adequacy testing for peritoneal dialysis: Secondary | ICD-10-CM | POA: Diagnosis not present

## 2021-05-08 DIAGNOSIS — D631 Anemia in chronic kidney disease: Secondary | ICD-10-CM | POA: Diagnosis not present

## 2021-05-08 DIAGNOSIS — N2581 Secondary hyperparathyroidism of renal origin: Secondary | ICD-10-CM | POA: Diagnosis not present

## 2021-05-08 DIAGNOSIS — Z992 Dependence on renal dialysis: Secondary | ICD-10-CM | POA: Diagnosis not present

## 2021-05-08 DIAGNOSIS — Z23 Encounter for immunization: Secondary | ICD-10-CM | POA: Diagnosis not present

## 2021-05-09 DIAGNOSIS — Z23 Encounter for immunization: Secondary | ICD-10-CM | POA: Diagnosis not present

## 2021-05-09 DIAGNOSIS — Z4932 Encounter for adequacy testing for peritoneal dialysis: Secondary | ICD-10-CM | POA: Diagnosis not present

## 2021-05-09 DIAGNOSIS — N2581 Secondary hyperparathyroidism of renal origin: Secondary | ICD-10-CM | POA: Diagnosis not present

## 2021-05-09 DIAGNOSIS — Z992 Dependence on renal dialysis: Secondary | ICD-10-CM | POA: Diagnosis not present

## 2021-05-09 DIAGNOSIS — D509 Iron deficiency anemia, unspecified: Secondary | ICD-10-CM | POA: Diagnosis not present

## 2021-05-09 DIAGNOSIS — N186 End stage renal disease: Secondary | ICD-10-CM | POA: Diagnosis not present

## 2021-05-09 DIAGNOSIS — D631 Anemia in chronic kidney disease: Secondary | ICD-10-CM | POA: Diagnosis not present

## 2021-05-10 DIAGNOSIS — Z4932 Encounter for adequacy testing for peritoneal dialysis: Secondary | ICD-10-CM | POA: Diagnosis not present

## 2021-05-10 DIAGNOSIS — D631 Anemia in chronic kidney disease: Secondary | ICD-10-CM | POA: Diagnosis not present

## 2021-05-10 DIAGNOSIS — N2581 Secondary hyperparathyroidism of renal origin: Secondary | ICD-10-CM | POA: Diagnosis not present

## 2021-05-10 DIAGNOSIS — D509 Iron deficiency anemia, unspecified: Secondary | ICD-10-CM | POA: Diagnosis not present

## 2021-05-10 DIAGNOSIS — N186 End stage renal disease: Secondary | ICD-10-CM | POA: Diagnosis not present

## 2021-05-10 DIAGNOSIS — Z23 Encounter for immunization: Secondary | ICD-10-CM | POA: Diagnosis not present

## 2021-05-10 DIAGNOSIS — Z992 Dependence on renal dialysis: Secondary | ICD-10-CM | POA: Diagnosis not present

## 2021-05-11 DIAGNOSIS — Z4932 Encounter for adequacy testing for peritoneal dialysis: Secondary | ICD-10-CM | POA: Diagnosis not present

## 2021-05-11 DIAGNOSIS — Z23 Encounter for immunization: Secondary | ICD-10-CM | POA: Diagnosis not present

## 2021-05-11 DIAGNOSIS — Z992 Dependence on renal dialysis: Secondary | ICD-10-CM | POA: Diagnosis not present

## 2021-05-11 DIAGNOSIS — N186 End stage renal disease: Secondary | ICD-10-CM | POA: Diagnosis not present

## 2021-05-11 DIAGNOSIS — D509 Iron deficiency anemia, unspecified: Secondary | ICD-10-CM | POA: Diagnosis not present

## 2021-05-11 DIAGNOSIS — I129 Hypertensive chronic kidney disease with stage 1 through stage 4 chronic kidney disease, or unspecified chronic kidney disease: Secondary | ICD-10-CM | POA: Diagnosis not present

## 2021-05-11 DIAGNOSIS — D631 Anemia in chronic kidney disease: Secondary | ICD-10-CM | POA: Diagnosis not present

## 2021-05-11 DIAGNOSIS — N2581 Secondary hyperparathyroidism of renal origin: Secondary | ICD-10-CM | POA: Diagnosis not present

## 2021-05-12 DIAGNOSIS — D631 Anemia in chronic kidney disease: Secondary | ICD-10-CM | POA: Diagnosis not present

## 2021-05-12 DIAGNOSIS — N186 End stage renal disease: Secondary | ICD-10-CM | POA: Diagnosis not present

## 2021-05-12 DIAGNOSIS — Z4932 Encounter for adequacy testing for peritoneal dialysis: Secondary | ICD-10-CM | POA: Diagnosis not present

## 2021-05-12 DIAGNOSIS — Z992 Dependence on renal dialysis: Secondary | ICD-10-CM | POA: Diagnosis not present

## 2021-05-12 DIAGNOSIS — N2581 Secondary hyperparathyroidism of renal origin: Secondary | ICD-10-CM | POA: Diagnosis not present

## 2021-05-12 DIAGNOSIS — D509 Iron deficiency anemia, unspecified: Secondary | ICD-10-CM | POA: Diagnosis not present

## 2021-05-12 DIAGNOSIS — Z23 Encounter for immunization: Secondary | ICD-10-CM | POA: Diagnosis not present

## 2021-05-13 DIAGNOSIS — N2581 Secondary hyperparathyroidism of renal origin: Secondary | ICD-10-CM | POA: Diagnosis not present

## 2021-05-13 DIAGNOSIS — Z4932 Encounter for adequacy testing for peritoneal dialysis: Secondary | ICD-10-CM | POA: Diagnosis not present

## 2021-05-13 DIAGNOSIS — N186 End stage renal disease: Secondary | ICD-10-CM | POA: Diagnosis not present

## 2021-05-13 DIAGNOSIS — D631 Anemia in chronic kidney disease: Secondary | ICD-10-CM | POA: Diagnosis not present

## 2021-05-13 DIAGNOSIS — D509 Iron deficiency anemia, unspecified: Secondary | ICD-10-CM | POA: Diagnosis not present

## 2021-05-13 DIAGNOSIS — Z23 Encounter for immunization: Secondary | ICD-10-CM | POA: Diagnosis not present

## 2021-05-13 DIAGNOSIS — Z992 Dependence on renal dialysis: Secondary | ICD-10-CM | POA: Diagnosis not present

## 2021-05-14 DIAGNOSIS — N2581 Secondary hyperparathyroidism of renal origin: Secondary | ICD-10-CM | POA: Diagnosis not present

## 2021-05-14 DIAGNOSIS — Z992 Dependence on renal dialysis: Secondary | ICD-10-CM | POA: Diagnosis not present

## 2021-05-14 DIAGNOSIS — D509 Iron deficiency anemia, unspecified: Secondary | ICD-10-CM | POA: Diagnosis not present

## 2021-05-14 DIAGNOSIS — N186 End stage renal disease: Secondary | ICD-10-CM | POA: Diagnosis not present

## 2021-05-14 DIAGNOSIS — D631 Anemia in chronic kidney disease: Secondary | ICD-10-CM | POA: Diagnosis not present

## 2021-05-14 DIAGNOSIS — Z23 Encounter for immunization: Secondary | ICD-10-CM | POA: Diagnosis not present

## 2021-05-14 DIAGNOSIS — Z4932 Encounter for adequacy testing for peritoneal dialysis: Secondary | ICD-10-CM | POA: Diagnosis not present

## 2021-05-15 DIAGNOSIS — N2581 Secondary hyperparathyroidism of renal origin: Secondary | ICD-10-CM | POA: Diagnosis not present

## 2021-05-15 DIAGNOSIS — Z4932 Encounter for adequacy testing for peritoneal dialysis: Secondary | ICD-10-CM | POA: Diagnosis not present

## 2021-05-15 DIAGNOSIS — Z23 Encounter for immunization: Secondary | ICD-10-CM | POA: Diagnosis not present

## 2021-05-15 DIAGNOSIS — Z992 Dependence on renal dialysis: Secondary | ICD-10-CM | POA: Diagnosis not present

## 2021-05-15 DIAGNOSIS — D509 Iron deficiency anemia, unspecified: Secondary | ICD-10-CM | POA: Diagnosis not present

## 2021-05-15 DIAGNOSIS — N186 End stage renal disease: Secondary | ICD-10-CM | POA: Diagnosis not present

## 2021-05-15 DIAGNOSIS — D631 Anemia in chronic kidney disease: Secondary | ICD-10-CM | POA: Diagnosis not present

## 2021-05-16 DIAGNOSIS — Z992 Dependence on renal dialysis: Secondary | ICD-10-CM | POA: Diagnosis not present

## 2021-05-16 DIAGNOSIS — N186 End stage renal disease: Secondary | ICD-10-CM | POA: Diagnosis not present

## 2021-05-16 DIAGNOSIS — Z23 Encounter for immunization: Secondary | ICD-10-CM | POA: Diagnosis not present

## 2021-05-16 DIAGNOSIS — D509 Iron deficiency anemia, unspecified: Secondary | ICD-10-CM | POA: Diagnosis not present

## 2021-05-16 DIAGNOSIS — Z4932 Encounter for adequacy testing for peritoneal dialysis: Secondary | ICD-10-CM | POA: Diagnosis not present

## 2021-05-16 DIAGNOSIS — D631 Anemia in chronic kidney disease: Secondary | ICD-10-CM | POA: Diagnosis not present

## 2021-05-16 DIAGNOSIS — N2581 Secondary hyperparathyroidism of renal origin: Secondary | ICD-10-CM | POA: Diagnosis not present

## 2021-05-17 DIAGNOSIS — Z4932 Encounter for adequacy testing for peritoneal dialysis: Secondary | ICD-10-CM | POA: Diagnosis not present

## 2021-05-17 DIAGNOSIS — D509 Iron deficiency anemia, unspecified: Secondary | ICD-10-CM | POA: Diagnosis not present

## 2021-05-17 DIAGNOSIS — Z23 Encounter for immunization: Secondary | ICD-10-CM | POA: Diagnosis not present

## 2021-05-17 DIAGNOSIS — D631 Anemia in chronic kidney disease: Secondary | ICD-10-CM | POA: Diagnosis not present

## 2021-05-17 DIAGNOSIS — N2581 Secondary hyperparathyroidism of renal origin: Secondary | ICD-10-CM | POA: Diagnosis not present

## 2021-05-17 DIAGNOSIS — Z992 Dependence on renal dialysis: Secondary | ICD-10-CM | POA: Diagnosis not present

## 2021-05-17 DIAGNOSIS — N186 End stage renal disease: Secondary | ICD-10-CM | POA: Diagnosis not present

## 2021-05-18 DIAGNOSIS — Z4932 Encounter for adequacy testing for peritoneal dialysis: Secondary | ICD-10-CM | POA: Diagnosis not present

## 2021-05-18 DIAGNOSIS — D631 Anemia in chronic kidney disease: Secondary | ICD-10-CM | POA: Diagnosis not present

## 2021-05-18 DIAGNOSIS — D509 Iron deficiency anemia, unspecified: Secondary | ICD-10-CM | POA: Diagnosis not present

## 2021-05-18 DIAGNOSIS — N186 End stage renal disease: Secondary | ICD-10-CM | POA: Diagnosis not present

## 2021-05-18 DIAGNOSIS — Z992 Dependence on renal dialysis: Secondary | ICD-10-CM | POA: Diagnosis not present

## 2021-05-18 DIAGNOSIS — Z23 Encounter for immunization: Secondary | ICD-10-CM | POA: Diagnosis not present

## 2021-05-18 DIAGNOSIS — N2581 Secondary hyperparathyroidism of renal origin: Secondary | ICD-10-CM | POA: Diagnosis not present

## 2021-05-19 DIAGNOSIS — N186 End stage renal disease: Secondary | ICD-10-CM | POA: Diagnosis not present

## 2021-05-19 DIAGNOSIS — Z23 Encounter for immunization: Secondary | ICD-10-CM | POA: Diagnosis not present

## 2021-05-19 DIAGNOSIS — N2581 Secondary hyperparathyroidism of renal origin: Secondary | ICD-10-CM | POA: Diagnosis not present

## 2021-05-19 DIAGNOSIS — D631 Anemia in chronic kidney disease: Secondary | ICD-10-CM | POA: Diagnosis not present

## 2021-05-19 DIAGNOSIS — D509 Iron deficiency anemia, unspecified: Secondary | ICD-10-CM | POA: Diagnosis not present

## 2021-05-19 DIAGNOSIS — Z4932 Encounter for adequacy testing for peritoneal dialysis: Secondary | ICD-10-CM | POA: Diagnosis not present

## 2021-05-19 DIAGNOSIS — Z992 Dependence on renal dialysis: Secondary | ICD-10-CM | POA: Diagnosis not present

## 2021-05-20 DIAGNOSIS — D631 Anemia in chronic kidney disease: Secondary | ICD-10-CM | POA: Diagnosis not present

## 2021-05-20 DIAGNOSIS — Z992 Dependence on renal dialysis: Secondary | ICD-10-CM | POA: Diagnosis not present

## 2021-05-20 DIAGNOSIS — N186 End stage renal disease: Secondary | ICD-10-CM | POA: Diagnosis not present

## 2021-05-20 DIAGNOSIS — Z23 Encounter for immunization: Secondary | ICD-10-CM | POA: Diagnosis not present

## 2021-05-20 DIAGNOSIS — N2581 Secondary hyperparathyroidism of renal origin: Secondary | ICD-10-CM | POA: Diagnosis not present

## 2021-05-20 DIAGNOSIS — Z4932 Encounter for adequacy testing for peritoneal dialysis: Secondary | ICD-10-CM | POA: Diagnosis not present

## 2021-05-20 DIAGNOSIS — D509 Iron deficiency anemia, unspecified: Secondary | ICD-10-CM | POA: Diagnosis not present

## 2021-05-21 DIAGNOSIS — N186 End stage renal disease: Secondary | ICD-10-CM | POA: Diagnosis not present

## 2021-05-21 DIAGNOSIS — D509 Iron deficiency anemia, unspecified: Secondary | ICD-10-CM | POA: Diagnosis not present

## 2021-05-21 DIAGNOSIS — Z992 Dependence on renal dialysis: Secondary | ICD-10-CM | POA: Diagnosis not present

## 2021-05-21 DIAGNOSIS — N2581 Secondary hyperparathyroidism of renal origin: Secondary | ICD-10-CM | POA: Diagnosis not present

## 2021-05-21 DIAGNOSIS — Z23 Encounter for immunization: Secondary | ICD-10-CM | POA: Diagnosis not present

## 2021-05-21 DIAGNOSIS — D631 Anemia in chronic kidney disease: Secondary | ICD-10-CM | POA: Diagnosis not present

## 2021-05-21 DIAGNOSIS — Z4932 Encounter for adequacy testing for peritoneal dialysis: Secondary | ICD-10-CM | POA: Diagnosis not present

## 2021-05-22 DIAGNOSIS — Z4932 Encounter for adequacy testing for peritoneal dialysis: Secondary | ICD-10-CM | POA: Diagnosis not present

## 2021-05-22 DIAGNOSIS — Z992 Dependence on renal dialysis: Secondary | ICD-10-CM | POA: Diagnosis not present

## 2021-05-22 DIAGNOSIS — D631 Anemia in chronic kidney disease: Secondary | ICD-10-CM | POA: Diagnosis not present

## 2021-05-22 DIAGNOSIS — N186 End stage renal disease: Secondary | ICD-10-CM | POA: Diagnosis not present

## 2021-05-22 DIAGNOSIS — N2581 Secondary hyperparathyroidism of renal origin: Secondary | ICD-10-CM | POA: Diagnosis not present

## 2021-05-22 DIAGNOSIS — D509 Iron deficiency anemia, unspecified: Secondary | ICD-10-CM | POA: Diagnosis not present

## 2021-05-22 DIAGNOSIS — Z23 Encounter for immunization: Secondary | ICD-10-CM | POA: Diagnosis not present

## 2021-05-23 DIAGNOSIS — D631 Anemia in chronic kidney disease: Secondary | ICD-10-CM | POA: Diagnosis not present

## 2021-05-23 DIAGNOSIS — Z23 Encounter for immunization: Secondary | ICD-10-CM | POA: Diagnosis not present

## 2021-05-23 DIAGNOSIS — Z4932 Encounter for adequacy testing for peritoneal dialysis: Secondary | ICD-10-CM | POA: Diagnosis not present

## 2021-05-23 DIAGNOSIS — Z992 Dependence on renal dialysis: Secondary | ICD-10-CM | POA: Diagnosis not present

## 2021-05-23 DIAGNOSIS — N2581 Secondary hyperparathyroidism of renal origin: Secondary | ICD-10-CM | POA: Diagnosis not present

## 2021-05-23 DIAGNOSIS — D509 Iron deficiency anemia, unspecified: Secondary | ICD-10-CM | POA: Diagnosis not present

## 2021-05-23 DIAGNOSIS — N186 End stage renal disease: Secondary | ICD-10-CM | POA: Diagnosis not present

## 2021-05-24 DIAGNOSIS — D509 Iron deficiency anemia, unspecified: Secondary | ICD-10-CM | POA: Diagnosis not present

## 2021-05-24 DIAGNOSIS — Z992 Dependence on renal dialysis: Secondary | ICD-10-CM | POA: Diagnosis not present

## 2021-05-24 DIAGNOSIS — Z4932 Encounter for adequacy testing for peritoneal dialysis: Secondary | ICD-10-CM | POA: Diagnosis not present

## 2021-05-24 DIAGNOSIS — N186 End stage renal disease: Secondary | ICD-10-CM | POA: Diagnosis not present

## 2021-05-24 DIAGNOSIS — N2581 Secondary hyperparathyroidism of renal origin: Secondary | ICD-10-CM | POA: Diagnosis not present

## 2021-05-24 DIAGNOSIS — Z23 Encounter for immunization: Secondary | ICD-10-CM | POA: Diagnosis not present

## 2021-05-24 DIAGNOSIS — D631 Anemia in chronic kidney disease: Secondary | ICD-10-CM | POA: Diagnosis not present

## 2021-05-25 DIAGNOSIS — N186 End stage renal disease: Secondary | ICD-10-CM | POA: Diagnosis not present

## 2021-05-25 DIAGNOSIS — Z4932 Encounter for adequacy testing for peritoneal dialysis: Secondary | ICD-10-CM | POA: Diagnosis not present

## 2021-05-25 DIAGNOSIS — Z992 Dependence on renal dialysis: Secondary | ICD-10-CM | POA: Diagnosis not present

## 2021-05-25 DIAGNOSIS — D509 Iron deficiency anemia, unspecified: Secondary | ICD-10-CM | POA: Diagnosis not present

## 2021-05-25 DIAGNOSIS — N2581 Secondary hyperparathyroidism of renal origin: Secondary | ICD-10-CM | POA: Diagnosis not present

## 2021-05-25 DIAGNOSIS — D631 Anemia in chronic kidney disease: Secondary | ICD-10-CM | POA: Diagnosis not present

## 2021-05-25 DIAGNOSIS — Z23 Encounter for immunization: Secondary | ICD-10-CM | POA: Diagnosis not present

## 2021-05-26 DIAGNOSIS — Z23 Encounter for immunization: Secondary | ICD-10-CM | POA: Diagnosis not present

## 2021-05-26 DIAGNOSIS — Z992 Dependence on renal dialysis: Secondary | ICD-10-CM | POA: Diagnosis not present

## 2021-05-26 DIAGNOSIS — D631 Anemia in chronic kidney disease: Secondary | ICD-10-CM | POA: Diagnosis not present

## 2021-05-26 DIAGNOSIS — Z4932 Encounter for adequacy testing for peritoneal dialysis: Secondary | ICD-10-CM | POA: Diagnosis not present

## 2021-05-26 DIAGNOSIS — N186 End stage renal disease: Secondary | ICD-10-CM | POA: Diagnosis not present

## 2021-05-26 DIAGNOSIS — N2581 Secondary hyperparathyroidism of renal origin: Secondary | ICD-10-CM | POA: Diagnosis not present

## 2021-05-26 DIAGNOSIS — D509 Iron deficiency anemia, unspecified: Secondary | ICD-10-CM | POA: Diagnosis not present

## 2021-05-27 DIAGNOSIS — Z4932 Encounter for adequacy testing for peritoneal dialysis: Secondary | ICD-10-CM | POA: Diagnosis not present

## 2021-05-27 DIAGNOSIS — Z992 Dependence on renal dialysis: Secondary | ICD-10-CM | POA: Diagnosis not present

## 2021-05-27 DIAGNOSIS — Z23 Encounter for immunization: Secondary | ICD-10-CM | POA: Diagnosis not present

## 2021-05-27 DIAGNOSIS — N2581 Secondary hyperparathyroidism of renal origin: Secondary | ICD-10-CM | POA: Diagnosis not present

## 2021-05-27 DIAGNOSIS — D631 Anemia in chronic kidney disease: Secondary | ICD-10-CM | POA: Diagnosis not present

## 2021-05-27 DIAGNOSIS — D509 Iron deficiency anemia, unspecified: Secondary | ICD-10-CM | POA: Diagnosis not present

## 2021-05-27 DIAGNOSIS — N186 End stage renal disease: Secondary | ICD-10-CM | POA: Diagnosis not present

## 2021-05-28 DIAGNOSIS — N186 End stage renal disease: Secondary | ICD-10-CM | POA: Diagnosis not present

## 2021-05-28 DIAGNOSIS — D509 Iron deficiency anemia, unspecified: Secondary | ICD-10-CM | POA: Diagnosis not present

## 2021-05-28 DIAGNOSIS — Z23 Encounter for immunization: Secondary | ICD-10-CM | POA: Diagnosis not present

## 2021-05-28 DIAGNOSIS — Z4932 Encounter for adequacy testing for peritoneal dialysis: Secondary | ICD-10-CM | POA: Diagnosis not present

## 2021-05-28 DIAGNOSIS — D631 Anemia in chronic kidney disease: Secondary | ICD-10-CM | POA: Diagnosis not present

## 2021-05-28 DIAGNOSIS — Z992 Dependence on renal dialysis: Secondary | ICD-10-CM | POA: Diagnosis not present

## 2021-05-28 DIAGNOSIS — N2581 Secondary hyperparathyroidism of renal origin: Secondary | ICD-10-CM | POA: Diagnosis not present

## 2021-05-29 DIAGNOSIS — Z23 Encounter for immunization: Secondary | ICD-10-CM | POA: Diagnosis not present

## 2021-05-29 DIAGNOSIS — N2581 Secondary hyperparathyroidism of renal origin: Secondary | ICD-10-CM | POA: Diagnosis not present

## 2021-05-29 DIAGNOSIS — N186 End stage renal disease: Secondary | ICD-10-CM | POA: Diagnosis not present

## 2021-05-29 DIAGNOSIS — D509 Iron deficiency anemia, unspecified: Secondary | ICD-10-CM | POA: Diagnosis not present

## 2021-05-29 DIAGNOSIS — Z992 Dependence on renal dialysis: Secondary | ICD-10-CM | POA: Diagnosis not present

## 2021-05-29 DIAGNOSIS — D631 Anemia in chronic kidney disease: Secondary | ICD-10-CM | POA: Diagnosis not present

## 2021-05-29 DIAGNOSIS — Z4932 Encounter for adequacy testing for peritoneal dialysis: Secondary | ICD-10-CM | POA: Diagnosis not present

## 2021-05-30 DIAGNOSIS — D631 Anemia in chronic kidney disease: Secondary | ICD-10-CM | POA: Diagnosis not present

## 2021-05-30 DIAGNOSIS — Z992 Dependence on renal dialysis: Secondary | ICD-10-CM | POA: Diagnosis not present

## 2021-05-30 DIAGNOSIS — N2581 Secondary hyperparathyroidism of renal origin: Secondary | ICD-10-CM | POA: Diagnosis not present

## 2021-05-30 DIAGNOSIS — D509 Iron deficiency anemia, unspecified: Secondary | ICD-10-CM | POA: Diagnosis not present

## 2021-05-30 DIAGNOSIS — Z23 Encounter for immunization: Secondary | ICD-10-CM | POA: Diagnosis not present

## 2021-05-30 DIAGNOSIS — N186 End stage renal disease: Secondary | ICD-10-CM | POA: Diagnosis not present

## 2021-05-30 DIAGNOSIS — Z4932 Encounter for adequacy testing for peritoneal dialysis: Secondary | ICD-10-CM | POA: Diagnosis not present

## 2021-05-31 DIAGNOSIS — N186 End stage renal disease: Secondary | ICD-10-CM | POA: Diagnosis not present

## 2021-05-31 DIAGNOSIS — Z992 Dependence on renal dialysis: Secondary | ICD-10-CM | POA: Diagnosis not present

## 2021-05-31 DIAGNOSIS — Z4932 Encounter for adequacy testing for peritoneal dialysis: Secondary | ICD-10-CM | POA: Diagnosis not present

## 2021-05-31 DIAGNOSIS — D631 Anemia in chronic kidney disease: Secondary | ICD-10-CM | POA: Diagnosis not present

## 2021-05-31 DIAGNOSIS — N2581 Secondary hyperparathyroidism of renal origin: Secondary | ICD-10-CM | POA: Diagnosis not present

## 2021-05-31 DIAGNOSIS — Z23 Encounter for immunization: Secondary | ICD-10-CM | POA: Diagnosis not present

## 2021-05-31 DIAGNOSIS — D509 Iron deficiency anemia, unspecified: Secondary | ICD-10-CM | POA: Diagnosis not present

## 2021-06-01 DIAGNOSIS — Z23 Encounter for immunization: Secondary | ICD-10-CM | POA: Diagnosis not present

## 2021-06-01 DIAGNOSIS — Z992 Dependence on renal dialysis: Secondary | ICD-10-CM | POA: Diagnosis not present

## 2021-06-01 DIAGNOSIS — N2581 Secondary hyperparathyroidism of renal origin: Secondary | ICD-10-CM | POA: Diagnosis not present

## 2021-06-01 DIAGNOSIS — D509 Iron deficiency anemia, unspecified: Secondary | ICD-10-CM | POA: Diagnosis not present

## 2021-06-01 DIAGNOSIS — Z4932 Encounter for adequacy testing for peritoneal dialysis: Secondary | ICD-10-CM | POA: Diagnosis not present

## 2021-06-01 DIAGNOSIS — N186 End stage renal disease: Secondary | ICD-10-CM | POA: Diagnosis not present

## 2021-06-01 DIAGNOSIS — D631 Anemia in chronic kidney disease: Secondary | ICD-10-CM | POA: Diagnosis not present

## 2021-06-02 DIAGNOSIS — Z992 Dependence on renal dialysis: Secondary | ICD-10-CM | POA: Diagnosis not present

## 2021-06-02 DIAGNOSIS — N186 End stage renal disease: Secondary | ICD-10-CM | POA: Diagnosis not present

## 2021-06-02 DIAGNOSIS — N2581 Secondary hyperparathyroidism of renal origin: Secondary | ICD-10-CM | POA: Diagnosis not present

## 2021-06-02 DIAGNOSIS — Z23 Encounter for immunization: Secondary | ICD-10-CM | POA: Diagnosis not present

## 2021-06-02 DIAGNOSIS — D509 Iron deficiency anemia, unspecified: Secondary | ICD-10-CM | POA: Diagnosis not present

## 2021-06-02 DIAGNOSIS — Z4932 Encounter for adequacy testing for peritoneal dialysis: Secondary | ICD-10-CM | POA: Diagnosis not present

## 2021-06-02 DIAGNOSIS — D631 Anemia in chronic kidney disease: Secondary | ICD-10-CM | POA: Diagnosis not present

## 2021-06-03 DIAGNOSIS — N2581 Secondary hyperparathyroidism of renal origin: Secondary | ICD-10-CM | POA: Diagnosis not present

## 2021-06-03 DIAGNOSIS — Z4932 Encounter for adequacy testing for peritoneal dialysis: Secondary | ICD-10-CM | POA: Diagnosis not present

## 2021-06-03 DIAGNOSIS — D631 Anemia in chronic kidney disease: Secondary | ICD-10-CM | POA: Diagnosis not present

## 2021-06-03 DIAGNOSIS — Z23 Encounter for immunization: Secondary | ICD-10-CM | POA: Diagnosis not present

## 2021-06-03 DIAGNOSIS — N186 End stage renal disease: Secondary | ICD-10-CM | POA: Diagnosis not present

## 2021-06-03 DIAGNOSIS — Z992 Dependence on renal dialysis: Secondary | ICD-10-CM | POA: Diagnosis not present

## 2021-06-03 DIAGNOSIS — D509 Iron deficiency anemia, unspecified: Secondary | ICD-10-CM | POA: Diagnosis not present

## 2021-06-04 DIAGNOSIS — Z4932 Encounter for adequacy testing for peritoneal dialysis: Secondary | ICD-10-CM | POA: Diagnosis not present

## 2021-06-04 DIAGNOSIS — D631 Anemia in chronic kidney disease: Secondary | ICD-10-CM | POA: Diagnosis not present

## 2021-06-04 DIAGNOSIS — N186 End stage renal disease: Secondary | ICD-10-CM | POA: Diagnosis not present

## 2021-06-04 DIAGNOSIS — N2581 Secondary hyperparathyroidism of renal origin: Secondary | ICD-10-CM | POA: Diagnosis not present

## 2021-06-04 DIAGNOSIS — Z23 Encounter for immunization: Secondary | ICD-10-CM | POA: Diagnosis not present

## 2021-06-04 DIAGNOSIS — Z992 Dependence on renal dialysis: Secondary | ICD-10-CM | POA: Diagnosis not present

## 2021-06-04 DIAGNOSIS — D509 Iron deficiency anemia, unspecified: Secondary | ICD-10-CM | POA: Diagnosis not present

## 2021-06-05 DIAGNOSIS — Z23 Encounter for immunization: Secondary | ICD-10-CM | POA: Diagnosis not present

## 2021-06-05 DIAGNOSIS — Z4932 Encounter for adequacy testing for peritoneal dialysis: Secondary | ICD-10-CM | POA: Diagnosis not present

## 2021-06-05 DIAGNOSIS — D509 Iron deficiency anemia, unspecified: Secondary | ICD-10-CM | POA: Diagnosis not present

## 2021-06-05 DIAGNOSIS — N2581 Secondary hyperparathyroidism of renal origin: Secondary | ICD-10-CM | POA: Diagnosis not present

## 2021-06-05 DIAGNOSIS — N186 End stage renal disease: Secondary | ICD-10-CM | POA: Diagnosis not present

## 2021-06-05 DIAGNOSIS — D631 Anemia in chronic kidney disease: Secondary | ICD-10-CM | POA: Diagnosis not present

## 2021-06-05 DIAGNOSIS — Z992 Dependence on renal dialysis: Secondary | ICD-10-CM | POA: Diagnosis not present

## 2021-06-06 DIAGNOSIS — Z23 Encounter for immunization: Secondary | ICD-10-CM | POA: Diagnosis not present

## 2021-06-06 DIAGNOSIS — Z4932 Encounter for adequacy testing for peritoneal dialysis: Secondary | ICD-10-CM | POA: Diagnosis not present

## 2021-06-06 DIAGNOSIS — N2581 Secondary hyperparathyroidism of renal origin: Secondary | ICD-10-CM | POA: Diagnosis not present

## 2021-06-06 DIAGNOSIS — D509 Iron deficiency anemia, unspecified: Secondary | ICD-10-CM | POA: Diagnosis not present

## 2021-06-06 DIAGNOSIS — Z992 Dependence on renal dialysis: Secondary | ICD-10-CM | POA: Diagnosis not present

## 2021-06-06 DIAGNOSIS — N186 End stage renal disease: Secondary | ICD-10-CM | POA: Diagnosis not present

## 2021-06-06 DIAGNOSIS — D631 Anemia in chronic kidney disease: Secondary | ICD-10-CM | POA: Diagnosis not present

## 2021-06-07 DIAGNOSIS — D509 Iron deficiency anemia, unspecified: Secondary | ICD-10-CM | POA: Diagnosis not present

## 2021-06-07 DIAGNOSIS — Z992 Dependence on renal dialysis: Secondary | ICD-10-CM | POA: Diagnosis not present

## 2021-06-07 DIAGNOSIS — N186 End stage renal disease: Secondary | ICD-10-CM | POA: Diagnosis not present

## 2021-06-07 DIAGNOSIS — N2581 Secondary hyperparathyroidism of renal origin: Secondary | ICD-10-CM | POA: Diagnosis not present

## 2021-06-07 DIAGNOSIS — Z23 Encounter for immunization: Secondary | ICD-10-CM | POA: Diagnosis not present

## 2021-06-07 DIAGNOSIS — D631 Anemia in chronic kidney disease: Secondary | ICD-10-CM | POA: Diagnosis not present

## 2021-06-07 DIAGNOSIS — Z4932 Encounter for adequacy testing for peritoneal dialysis: Secondary | ICD-10-CM | POA: Diagnosis not present

## 2021-06-08 DIAGNOSIS — D631 Anemia in chronic kidney disease: Secondary | ICD-10-CM | POA: Diagnosis not present

## 2021-06-08 DIAGNOSIS — D509 Iron deficiency anemia, unspecified: Secondary | ICD-10-CM | POA: Diagnosis not present

## 2021-06-08 DIAGNOSIS — N2581 Secondary hyperparathyroidism of renal origin: Secondary | ICD-10-CM | POA: Diagnosis not present

## 2021-06-08 DIAGNOSIS — Z23 Encounter for immunization: Secondary | ICD-10-CM | POA: Diagnosis not present

## 2021-06-08 DIAGNOSIS — N186 End stage renal disease: Secondary | ICD-10-CM | POA: Diagnosis not present

## 2021-06-08 DIAGNOSIS — Z992 Dependence on renal dialysis: Secondary | ICD-10-CM | POA: Diagnosis not present

## 2021-06-08 DIAGNOSIS — Z4932 Encounter for adequacy testing for peritoneal dialysis: Secondary | ICD-10-CM | POA: Diagnosis not present

## 2021-06-09 DIAGNOSIS — Z23 Encounter for immunization: Secondary | ICD-10-CM | POA: Diagnosis not present

## 2021-06-09 DIAGNOSIS — Z992 Dependence on renal dialysis: Secondary | ICD-10-CM | POA: Diagnosis not present

## 2021-06-09 DIAGNOSIS — N2581 Secondary hyperparathyroidism of renal origin: Secondary | ICD-10-CM | POA: Diagnosis not present

## 2021-06-09 DIAGNOSIS — D631 Anemia in chronic kidney disease: Secondary | ICD-10-CM | POA: Diagnosis not present

## 2021-06-09 DIAGNOSIS — Z4932 Encounter for adequacy testing for peritoneal dialysis: Secondary | ICD-10-CM | POA: Diagnosis not present

## 2021-06-09 DIAGNOSIS — D509 Iron deficiency anemia, unspecified: Secondary | ICD-10-CM | POA: Diagnosis not present

## 2021-06-09 DIAGNOSIS — N186 End stage renal disease: Secondary | ICD-10-CM | POA: Diagnosis not present

## 2021-06-10 DIAGNOSIS — Z23 Encounter for immunization: Secondary | ICD-10-CM | POA: Diagnosis not present

## 2021-06-10 DIAGNOSIS — D509 Iron deficiency anemia, unspecified: Secondary | ICD-10-CM | POA: Diagnosis not present

## 2021-06-10 DIAGNOSIS — D631 Anemia in chronic kidney disease: Secondary | ICD-10-CM | POA: Diagnosis not present

## 2021-06-10 DIAGNOSIS — N2581 Secondary hyperparathyroidism of renal origin: Secondary | ICD-10-CM | POA: Diagnosis not present

## 2021-06-10 DIAGNOSIS — Z4932 Encounter for adequacy testing for peritoneal dialysis: Secondary | ICD-10-CM | POA: Diagnosis not present

## 2021-06-10 DIAGNOSIS — Z992 Dependence on renal dialysis: Secondary | ICD-10-CM | POA: Diagnosis not present

## 2021-06-10 DIAGNOSIS — N186 End stage renal disease: Secondary | ICD-10-CM | POA: Diagnosis not present

## 2021-06-11 DIAGNOSIS — I129 Hypertensive chronic kidney disease with stage 1 through stage 4 chronic kidney disease, or unspecified chronic kidney disease: Secondary | ICD-10-CM | POA: Diagnosis not present

## 2021-06-11 DIAGNOSIS — D631 Anemia in chronic kidney disease: Secondary | ICD-10-CM | POA: Diagnosis not present

## 2021-06-11 DIAGNOSIS — Z4932 Encounter for adequacy testing for peritoneal dialysis: Secondary | ICD-10-CM | POA: Diagnosis not present

## 2021-06-11 DIAGNOSIS — Z992 Dependence on renal dialysis: Secondary | ICD-10-CM | POA: Diagnosis not present

## 2021-06-11 DIAGNOSIS — Z23 Encounter for immunization: Secondary | ICD-10-CM | POA: Diagnosis not present

## 2021-06-11 DIAGNOSIS — D509 Iron deficiency anemia, unspecified: Secondary | ICD-10-CM | POA: Diagnosis not present

## 2021-06-11 DIAGNOSIS — N2581 Secondary hyperparathyroidism of renal origin: Secondary | ICD-10-CM | POA: Diagnosis not present

## 2021-06-11 DIAGNOSIS — N186 End stage renal disease: Secondary | ICD-10-CM | POA: Diagnosis not present

## 2021-06-12 DIAGNOSIS — Z992 Dependence on renal dialysis: Secondary | ICD-10-CM | POA: Diagnosis not present

## 2021-06-12 DIAGNOSIS — N2581 Secondary hyperparathyroidism of renal origin: Secondary | ICD-10-CM | POA: Diagnosis not present

## 2021-06-12 DIAGNOSIS — Z23 Encounter for immunization: Secondary | ICD-10-CM | POA: Diagnosis not present

## 2021-06-12 DIAGNOSIS — D631 Anemia in chronic kidney disease: Secondary | ICD-10-CM | POA: Diagnosis not present

## 2021-06-12 DIAGNOSIS — N186 End stage renal disease: Secondary | ICD-10-CM | POA: Diagnosis not present

## 2021-06-12 DIAGNOSIS — D509 Iron deficiency anemia, unspecified: Secondary | ICD-10-CM | POA: Diagnosis not present

## 2021-06-13 DIAGNOSIS — N186 End stage renal disease: Secondary | ICD-10-CM | POA: Diagnosis not present

## 2021-06-13 DIAGNOSIS — Z23 Encounter for immunization: Secondary | ICD-10-CM | POA: Diagnosis not present

## 2021-06-13 DIAGNOSIS — Z992 Dependence on renal dialysis: Secondary | ICD-10-CM | POA: Diagnosis not present

## 2021-06-13 DIAGNOSIS — N2581 Secondary hyperparathyroidism of renal origin: Secondary | ICD-10-CM | POA: Diagnosis not present

## 2021-06-13 DIAGNOSIS — D509 Iron deficiency anemia, unspecified: Secondary | ICD-10-CM | POA: Diagnosis not present

## 2021-06-13 DIAGNOSIS — D631 Anemia in chronic kidney disease: Secondary | ICD-10-CM | POA: Diagnosis not present

## 2021-06-14 DIAGNOSIS — N2581 Secondary hyperparathyroidism of renal origin: Secondary | ICD-10-CM | POA: Diagnosis not present

## 2021-06-14 DIAGNOSIS — D631 Anemia in chronic kidney disease: Secondary | ICD-10-CM | POA: Diagnosis not present

## 2021-06-14 DIAGNOSIS — D509 Iron deficiency anemia, unspecified: Secondary | ICD-10-CM | POA: Diagnosis not present

## 2021-06-14 DIAGNOSIS — Z23 Encounter for immunization: Secondary | ICD-10-CM | POA: Diagnosis not present

## 2021-06-14 DIAGNOSIS — Z992 Dependence on renal dialysis: Secondary | ICD-10-CM | POA: Diagnosis not present

## 2021-06-14 DIAGNOSIS — N186 End stage renal disease: Secondary | ICD-10-CM | POA: Diagnosis not present

## 2021-06-15 DIAGNOSIS — Z23 Encounter for immunization: Secondary | ICD-10-CM | POA: Diagnosis not present

## 2021-06-15 DIAGNOSIS — D509 Iron deficiency anemia, unspecified: Secondary | ICD-10-CM | POA: Diagnosis not present

## 2021-06-15 DIAGNOSIS — Z992 Dependence on renal dialysis: Secondary | ICD-10-CM | POA: Diagnosis not present

## 2021-06-15 DIAGNOSIS — D631 Anemia in chronic kidney disease: Secondary | ICD-10-CM | POA: Diagnosis not present

## 2021-06-15 DIAGNOSIS — N2581 Secondary hyperparathyroidism of renal origin: Secondary | ICD-10-CM | POA: Diagnosis not present

## 2021-06-15 DIAGNOSIS — N186 End stage renal disease: Secondary | ICD-10-CM | POA: Diagnosis not present

## 2021-06-16 DIAGNOSIS — N2581 Secondary hyperparathyroidism of renal origin: Secondary | ICD-10-CM | POA: Diagnosis not present

## 2021-06-16 DIAGNOSIS — N186 End stage renal disease: Secondary | ICD-10-CM | POA: Diagnosis not present

## 2021-06-16 DIAGNOSIS — Z23 Encounter for immunization: Secondary | ICD-10-CM | POA: Diagnosis not present

## 2021-06-16 DIAGNOSIS — D631 Anemia in chronic kidney disease: Secondary | ICD-10-CM | POA: Diagnosis not present

## 2021-06-16 DIAGNOSIS — Z992 Dependence on renal dialysis: Secondary | ICD-10-CM | POA: Diagnosis not present

## 2021-06-16 DIAGNOSIS — D509 Iron deficiency anemia, unspecified: Secondary | ICD-10-CM | POA: Diagnosis not present

## 2021-06-17 DIAGNOSIS — D631 Anemia in chronic kidney disease: Secondary | ICD-10-CM | POA: Diagnosis not present

## 2021-06-17 DIAGNOSIS — N186 End stage renal disease: Secondary | ICD-10-CM | POA: Diagnosis not present

## 2021-06-17 DIAGNOSIS — N2581 Secondary hyperparathyroidism of renal origin: Secondary | ICD-10-CM | POA: Diagnosis not present

## 2021-06-17 DIAGNOSIS — Z992 Dependence on renal dialysis: Secondary | ICD-10-CM | POA: Diagnosis not present

## 2021-06-17 DIAGNOSIS — Z23 Encounter for immunization: Secondary | ICD-10-CM | POA: Diagnosis not present

## 2021-06-17 DIAGNOSIS — D509 Iron deficiency anemia, unspecified: Secondary | ICD-10-CM | POA: Diagnosis not present

## 2021-06-18 DIAGNOSIS — D631 Anemia in chronic kidney disease: Secondary | ICD-10-CM | POA: Diagnosis not present

## 2021-06-18 DIAGNOSIS — Z992 Dependence on renal dialysis: Secondary | ICD-10-CM | POA: Diagnosis not present

## 2021-06-18 DIAGNOSIS — D509 Iron deficiency anemia, unspecified: Secondary | ICD-10-CM | POA: Diagnosis not present

## 2021-06-18 DIAGNOSIS — N186 End stage renal disease: Secondary | ICD-10-CM | POA: Diagnosis not present

## 2021-06-18 DIAGNOSIS — Z23 Encounter for immunization: Secondary | ICD-10-CM | POA: Diagnosis not present

## 2021-06-18 DIAGNOSIS — N2581 Secondary hyperparathyroidism of renal origin: Secondary | ICD-10-CM | POA: Diagnosis not present

## 2021-06-19 DIAGNOSIS — N186 End stage renal disease: Secondary | ICD-10-CM | POA: Diagnosis not present

## 2021-06-19 DIAGNOSIS — D631 Anemia in chronic kidney disease: Secondary | ICD-10-CM | POA: Diagnosis not present

## 2021-06-19 DIAGNOSIS — Z992 Dependence on renal dialysis: Secondary | ICD-10-CM | POA: Diagnosis not present

## 2021-06-19 DIAGNOSIS — D509 Iron deficiency anemia, unspecified: Secondary | ICD-10-CM | POA: Diagnosis not present

## 2021-06-19 DIAGNOSIS — N2581 Secondary hyperparathyroidism of renal origin: Secondary | ICD-10-CM | POA: Diagnosis not present

## 2021-06-19 DIAGNOSIS — Z23 Encounter for immunization: Secondary | ICD-10-CM | POA: Diagnosis not present

## 2021-06-20 DIAGNOSIS — D631 Anemia in chronic kidney disease: Secondary | ICD-10-CM | POA: Diagnosis not present

## 2021-06-20 DIAGNOSIS — N2581 Secondary hyperparathyroidism of renal origin: Secondary | ICD-10-CM | POA: Diagnosis not present

## 2021-06-20 DIAGNOSIS — Z23 Encounter for immunization: Secondary | ICD-10-CM | POA: Diagnosis not present

## 2021-06-20 DIAGNOSIS — Z992 Dependence on renal dialysis: Secondary | ICD-10-CM | POA: Diagnosis not present

## 2021-06-20 DIAGNOSIS — N186 End stage renal disease: Secondary | ICD-10-CM | POA: Diagnosis not present

## 2021-06-20 DIAGNOSIS — D509 Iron deficiency anemia, unspecified: Secondary | ICD-10-CM | POA: Diagnosis not present

## 2021-06-21 DIAGNOSIS — N2581 Secondary hyperparathyroidism of renal origin: Secondary | ICD-10-CM | POA: Diagnosis not present

## 2021-06-21 DIAGNOSIS — Z23 Encounter for immunization: Secondary | ICD-10-CM | POA: Diagnosis not present

## 2021-06-21 DIAGNOSIS — Z992 Dependence on renal dialysis: Secondary | ICD-10-CM | POA: Diagnosis not present

## 2021-06-21 DIAGNOSIS — N186 End stage renal disease: Secondary | ICD-10-CM | POA: Diagnosis not present

## 2021-06-21 DIAGNOSIS — D631 Anemia in chronic kidney disease: Secondary | ICD-10-CM | POA: Diagnosis not present

## 2021-06-21 DIAGNOSIS — D509 Iron deficiency anemia, unspecified: Secondary | ICD-10-CM | POA: Diagnosis not present

## 2021-06-22 DIAGNOSIS — Z992 Dependence on renal dialysis: Secondary | ICD-10-CM | POA: Diagnosis not present

## 2021-06-22 DIAGNOSIS — Z23 Encounter for immunization: Secondary | ICD-10-CM | POA: Diagnosis not present

## 2021-06-22 DIAGNOSIS — D509 Iron deficiency anemia, unspecified: Secondary | ICD-10-CM | POA: Diagnosis not present

## 2021-06-22 DIAGNOSIS — D631 Anemia in chronic kidney disease: Secondary | ICD-10-CM | POA: Diagnosis not present

## 2021-06-22 DIAGNOSIS — N186 End stage renal disease: Secondary | ICD-10-CM | POA: Diagnosis not present

## 2021-06-22 DIAGNOSIS — N2581 Secondary hyperparathyroidism of renal origin: Secondary | ICD-10-CM | POA: Diagnosis not present

## 2021-06-23 DIAGNOSIS — N186 End stage renal disease: Secondary | ICD-10-CM | POA: Diagnosis not present

## 2021-06-23 DIAGNOSIS — D631 Anemia in chronic kidney disease: Secondary | ICD-10-CM | POA: Diagnosis not present

## 2021-06-23 DIAGNOSIS — Z23 Encounter for immunization: Secondary | ICD-10-CM | POA: Diagnosis not present

## 2021-06-23 DIAGNOSIS — Z992 Dependence on renal dialysis: Secondary | ICD-10-CM | POA: Diagnosis not present

## 2021-06-23 DIAGNOSIS — N2581 Secondary hyperparathyroidism of renal origin: Secondary | ICD-10-CM | POA: Diagnosis not present

## 2021-06-23 DIAGNOSIS — D509 Iron deficiency anemia, unspecified: Secondary | ICD-10-CM | POA: Diagnosis not present

## 2021-06-24 DIAGNOSIS — Z992 Dependence on renal dialysis: Secondary | ICD-10-CM | POA: Diagnosis not present

## 2021-06-24 DIAGNOSIS — D509 Iron deficiency anemia, unspecified: Secondary | ICD-10-CM | POA: Diagnosis not present

## 2021-06-24 DIAGNOSIS — N2581 Secondary hyperparathyroidism of renal origin: Secondary | ICD-10-CM | POA: Diagnosis not present

## 2021-06-24 DIAGNOSIS — Z23 Encounter for immunization: Secondary | ICD-10-CM | POA: Diagnosis not present

## 2021-06-24 DIAGNOSIS — N186 End stage renal disease: Secondary | ICD-10-CM | POA: Diagnosis not present

## 2021-06-24 DIAGNOSIS — D631 Anemia in chronic kidney disease: Secondary | ICD-10-CM | POA: Diagnosis not present

## 2021-06-25 DIAGNOSIS — Z23 Encounter for immunization: Secondary | ICD-10-CM | POA: Diagnosis not present

## 2021-06-25 DIAGNOSIS — D631 Anemia in chronic kidney disease: Secondary | ICD-10-CM | POA: Diagnosis not present

## 2021-06-25 DIAGNOSIS — Z992 Dependence on renal dialysis: Secondary | ICD-10-CM | POA: Diagnosis not present

## 2021-06-25 DIAGNOSIS — N2581 Secondary hyperparathyroidism of renal origin: Secondary | ICD-10-CM | POA: Diagnosis not present

## 2021-06-25 DIAGNOSIS — N186 End stage renal disease: Secondary | ICD-10-CM | POA: Diagnosis not present

## 2021-06-25 DIAGNOSIS — D509 Iron deficiency anemia, unspecified: Secondary | ICD-10-CM | POA: Diagnosis not present

## 2021-06-26 DIAGNOSIS — Z23 Encounter for immunization: Secondary | ICD-10-CM | POA: Diagnosis not present

## 2021-06-26 DIAGNOSIS — D509 Iron deficiency anemia, unspecified: Secondary | ICD-10-CM | POA: Diagnosis not present

## 2021-06-26 DIAGNOSIS — D631 Anemia in chronic kidney disease: Secondary | ICD-10-CM | POA: Diagnosis not present

## 2021-06-26 DIAGNOSIS — N2581 Secondary hyperparathyroidism of renal origin: Secondary | ICD-10-CM | POA: Diagnosis not present

## 2021-06-26 DIAGNOSIS — Z992 Dependence on renal dialysis: Secondary | ICD-10-CM | POA: Diagnosis not present

## 2021-06-26 DIAGNOSIS — N186 End stage renal disease: Secondary | ICD-10-CM | POA: Diagnosis not present

## 2021-06-27 DIAGNOSIS — Z992 Dependence on renal dialysis: Secondary | ICD-10-CM | POA: Diagnosis not present

## 2021-06-27 DIAGNOSIS — D509 Iron deficiency anemia, unspecified: Secondary | ICD-10-CM | POA: Diagnosis not present

## 2021-06-27 DIAGNOSIS — N2581 Secondary hyperparathyroidism of renal origin: Secondary | ICD-10-CM | POA: Diagnosis not present

## 2021-06-27 DIAGNOSIS — N186 End stage renal disease: Secondary | ICD-10-CM | POA: Diagnosis not present

## 2021-06-27 DIAGNOSIS — Z23 Encounter for immunization: Secondary | ICD-10-CM | POA: Diagnosis not present

## 2021-06-27 DIAGNOSIS — D631 Anemia in chronic kidney disease: Secondary | ICD-10-CM | POA: Diagnosis not present

## 2021-06-28 DIAGNOSIS — Z992 Dependence on renal dialysis: Secondary | ICD-10-CM | POA: Diagnosis not present

## 2021-06-28 DIAGNOSIS — D509 Iron deficiency anemia, unspecified: Secondary | ICD-10-CM | POA: Diagnosis not present

## 2021-06-28 DIAGNOSIS — N2581 Secondary hyperparathyroidism of renal origin: Secondary | ICD-10-CM | POA: Diagnosis not present

## 2021-06-28 DIAGNOSIS — D631 Anemia in chronic kidney disease: Secondary | ICD-10-CM | POA: Diagnosis not present

## 2021-06-28 DIAGNOSIS — Z23 Encounter for immunization: Secondary | ICD-10-CM | POA: Diagnosis not present

## 2021-06-28 DIAGNOSIS — N186 End stage renal disease: Secondary | ICD-10-CM | POA: Diagnosis not present

## 2021-06-29 DIAGNOSIS — D631 Anemia in chronic kidney disease: Secondary | ICD-10-CM | POA: Diagnosis not present

## 2021-06-29 DIAGNOSIS — N186 End stage renal disease: Secondary | ICD-10-CM | POA: Diagnosis not present

## 2021-06-29 DIAGNOSIS — Z23 Encounter for immunization: Secondary | ICD-10-CM | POA: Diagnosis not present

## 2021-06-29 DIAGNOSIS — D509 Iron deficiency anemia, unspecified: Secondary | ICD-10-CM | POA: Diagnosis not present

## 2021-06-29 DIAGNOSIS — Z992 Dependence on renal dialysis: Secondary | ICD-10-CM | POA: Diagnosis not present

## 2021-06-29 DIAGNOSIS — N2581 Secondary hyperparathyroidism of renal origin: Secondary | ICD-10-CM | POA: Diagnosis not present

## 2021-06-30 DIAGNOSIS — D631 Anemia in chronic kidney disease: Secondary | ICD-10-CM | POA: Diagnosis not present

## 2021-06-30 DIAGNOSIS — Z23 Encounter for immunization: Secondary | ICD-10-CM | POA: Diagnosis not present

## 2021-06-30 DIAGNOSIS — D509 Iron deficiency anemia, unspecified: Secondary | ICD-10-CM | POA: Diagnosis not present

## 2021-06-30 DIAGNOSIS — Z992 Dependence on renal dialysis: Secondary | ICD-10-CM | POA: Diagnosis not present

## 2021-06-30 DIAGNOSIS — N186 End stage renal disease: Secondary | ICD-10-CM | POA: Diagnosis not present

## 2021-06-30 DIAGNOSIS — N2581 Secondary hyperparathyroidism of renal origin: Secondary | ICD-10-CM | POA: Diagnosis not present

## 2021-07-01 DIAGNOSIS — D631 Anemia in chronic kidney disease: Secondary | ICD-10-CM | POA: Diagnosis not present

## 2021-07-01 DIAGNOSIS — N186 End stage renal disease: Secondary | ICD-10-CM | POA: Diagnosis not present

## 2021-07-01 DIAGNOSIS — N2581 Secondary hyperparathyroidism of renal origin: Secondary | ICD-10-CM | POA: Diagnosis not present

## 2021-07-01 DIAGNOSIS — D509 Iron deficiency anemia, unspecified: Secondary | ICD-10-CM | POA: Diagnosis not present

## 2021-07-01 DIAGNOSIS — Z992 Dependence on renal dialysis: Secondary | ICD-10-CM | POA: Diagnosis not present

## 2021-07-01 DIAGNOSIS — Z23 Encounter for immunization: Secondary | ICD-10-CM | POA: Diagnosis not present

## 2021-07-02 DIAGNOSIS — Z992 Dependence on renal dialysis: Secondary | ICD-10-CM | POA: Diagnosis not present

## 2021-07-02 DIAGNOSIS — D631 Anemia in chronic kidney disease: Secondary | ICD-10-CM | POA: Diagnosis not present

## 2021-07-02 DIAGNOSIS — Z23 Encounter for immunization: Secondary | ICD-10-CM | POA: Diagnosis not present

## 2021-07-02 DIAGNOSIS — D509 Iron deficiency anemia, unspecified: Secondary | ICD-10-CM | POA: Diagnosis not present

## 2021-07-02 DIAGNOSIS — N2581 Secondary hyperparathyroidism of renal origin: Secondary | ICD-10-CM | POA: Diagnosis not present

## 2021-07-02 DIAGNOSIS — N186 End stage renal disease: Secondary | ICD-10-CM | POA: Diagnosis not present

## 2021-07-03 DIAGNOSIS — D631 Anemia in chronic kidney disease: Secondary | ICD-10-CM | POA: Diagnosis not present

## 2021-07-03 DIAGNOSIS — D509 Iron deficiency anemia, unspecified: Secondary | ICD-10-CM | POA: Diagnosis not present

## 2021-07-03 DIAGNOSIS — N2581 Secondary hyperparathyroidism of renal origin: Secondary | ICD-10-CM | POA: Diagnosis not present

## 2021-07-03 DIAGNOSIS — Z23 Encounter for immunization: Secondary | ICD-10-CM | POA: Diagnosis not present

## 2021-07-03 DIAGNOSIS — Z992 Dependence on renal dialysis: Secondary | ICD-10-CM | POA: Diagnosis not present

## 2021-07-03 DIAGNOSIS — N186 End stage renal disease: Secondary | ICD-10-CM | POA: Diagnosis not present

## 2021-07-04 DIAGNOSIS — C44619 Basal cell carcinoma of skin of left upper limb, including shoulder: Secondary | ICD-10-CM | POA: Diagnosis not present

## 2021-07-04 DIAGNOSIS — N186 End stage renal disease: Secondary | ICD-10-CM | POA: Diagnosis not present

## 2021-07-04 DIAGNOSIS — C44321 Squamous cell carcinoma of skin of nose: Secondary | ICD-10-CM | POA: Diagnosis not present

## 2021-07-04 DIAGNOSIS — C44219 Basal cell carcinoma of skin of left ear and external auricular canal: Secondary | ICD-10-CM | POA: Diagnosis not present

## 2021-07-04 DIAGNOSIS — C44319 Basal cell carcinoma of skin of other parts of face: Secondary | ICD-10-CM | POA: Diagnosis not present

## 2021-07-04 DIAGNOSIS — D509 Iron deficiency anemia, unspecified: Secondary | ICD-10-CM | POA: Diagnosis not present

## 2021-07-04 DIAGNOSIS — B078 Other viral warts: Secondary | ICD-10-CM | POA: Diagnosis not present

## 2021-07-04 DIAGNOSIS — N2581 Secondary hyperparathyroidism of renal origin: Secondary | ICD-10-CM | POA: Diagnosis not present

## 2021-07-04 DIAGNOSIS — Z992 Dependence on renal dialysis: Secondary | ICD-10-CM | POA: Diagnosis not present

## 2021-07-04 DIAGNOSIS — C4401 Basal cell carcinoma of skin of lip: Secondary | ICD-10-CM | POA: Diagnosis not present

## 2021-07-04 DIAGNOSIS — D631 Anemia in chronic kidney disease: Secondary | ICD-10-CM | POA: Diagnosis not present

## 2021-07-04 DIAGNOSIS — L570 Actinic keratosis: Secondary | ICD-10-CM | POA: Diagnosis not present

## 2021-07-04 DIAGNOSIS — Z23 Encounter for immunization: Secondary | ICD-10-CM | POA: Diagnosis not present

## 2021-07-05 DIAGNOSIS — Z23 Encounter for immunization: Secondary | ICD-10-CM | POA: Diagnosis not present

## 2021-07-05 DIAGNOSIS — Z992 Dependence on renal dialysis: Secondary | ICD-10-CM | POA: Diagnosis not present

## 2021-07-05 DIAGNOSIS — D509 Iron deficiency anemia, unspecified: Secondary | ICD-10-CM | POA: Diagnosis not present

## 2021-07-05 DIAGNOSIS — D631 Anemia in chronic kidney disease: Secondary | ICD-10-CM | POA: Diagnosis not present

## 2021-07-05 DIAGNOSIS — N2581 Secondary hyperparathyroidism of renal origin: Secondary | ICD-10-CM | POA: Diagnosis not present

## 2021-07-05 DIAGNOSIS — N186 End stage renal disease: Secondary | ICD-10-CM | POA: Diagnosis not present

## 2021-07-06 DIAGNOSIS — N186 End stage renal disease: Secondary | ICD-10-CM | POA: Diagnosis not present

## 2021-07-06 DIAGNOSIS — Z23 Encounter for immunization: Secondary | ICD-10-CM | POA: Diagnosis not present

## 2021-07-06 DIAGNOSIS — N2581 Secondary hyperparathyroidism of renal origin: Secondary | ICD-10-CM | POA: Diagnosis not present

## 2021-07-06 DIAGNOSIS — D509 Iron deficiency anemia, unspecified: Secondary | ICD-10-CM | POA: Diagnosis not present

## 2021-07-06 DIAGNOSIS — D631 Anemia in chronic kidney disease: Secondary | ICD-10-CM | POA: Diagnosis not present

## 2021-07-06 DIAGNOSIS — Z992 Dependence on renal dialysis: Secondary | ICD-10-CM | POA: Diagnosis not present

## 2021-07-07 DIAGNOSIS — Z23 Encounter for immunization: Secondary | ICD-10-CM | POA: Diagnosis not present

## 2021-07-07 DIAGNOSIS — D509 Iron deficiency anemia, unspecified: Secondary | ICD-10-CM | POA: Diagnosis not present

## 2021-07-07 DIAGNOSIS — N186 End stage renal disease: Secondary | ICD-10-CM | POA: Diagnosis not present

## 2021-07-07 DIAGNOSIS — D631 Anemia in chronic kidney disease: Secondary | ICD-10-CM | POA: Diagnosis not present

## 2021-07-07 DIAGNOSIS — N2581 Secondary hyperparathyroidism of renal origin: Secondary | ICD-10-CM | POA: Diagnosis not present

## 2021-07-07 DIAGNOSIS — Z992 Dependence on renal dialysis: Secondary | ICD-10-CM | POA: Diagnosis not present

## 2021-07-08 DIAGNOSIS — N186 End stage renal disease: Secondary | ICD-10-CM | POA: Diagnosis not present

## 2021-07-08 DIAGNOSIS — Z992 Dependence on renal dialysis: Secondary | ICD-10-CM | POA: Diagnosis not present

## 2021-07-08 DIAGNOSIS — Z23 Encounter for immunization: Secondary | ICD-10-CM | POA: Diagnosis not present

## 2021-07-08 DIAGNOSIS — N2581 Secondary hyperparathyroidism of renal origin: Secondary | ICD-10-CM | POA: Diagnosis not present

## 2021-07-08 DIAGNOSIS — D509 Iron deficiency anemia, unspecified: Secondary | ICD-10-CM | POA: Diagnosis not present

## 2021-07-08 DIAGNOSIS — D631 Anemia in chronic kidney disease: Secondary | ICD-10-CM | POA: Diagnosis not present

## 2021-07-09 DIAGNOSIS — Z23 Encounter for immunization: Secondary | ICD-10-CM | POA: Diagnosis not present

## 2021-07-09 DIAGNOSIS — D631 Anemia in chronic kidney disease: Secondary | ICD-10-CM | POA: Diagnosis not present

## 2021-07-09 DIAGNOSIS — N186 End stage renal disease: Secondary | ICD-10-CM | POA: Diagnosis not present

## 2021-07-09 DIAGNOSIS — D509 Iron deficiency anemia, unspecified: Secondary | ICD-10-CM | POA: Diagnosis not present

## 2021-07-09 DIAGNOSIS — Z992 Dependence on renal dialysis: Secondary | ICD-10-CM | POA: Diagnosis not present

## 2021-07-09 DIAGNOSIS — N2581 Secondary hyperparathyroidism of renal origin: Secondary | ICD-10-CM | POA: Diagnosis not present

## 2021-07-10 DIAGNOSIS — D509 Iron deficiency anemia, unspecified: Secondary | ICD-10-CM | POA: Diagnosis not present

## 2021-07-10 DIAGNOSIS — D631 Anemia in chronic kidney disease: Secondary | ICD-10-CM | POA: Diagnosis not present

## 2021-07-10 DIAGNOSIS — Z992 Dependence on renal dialysis: Secondary | ICD-10-CM | POA: Diagnosis not present

## 2021-07-10 DIAGNOSIS — N186 End stage renal disease: Secondary | ICD-10-CM | POA: Diagnosis not present

## 2021-07-10 DIAGNOSIS — N2581 Secondary hyperparathyroidism of renal origin: Secondary | ICD-10-CM | POA: Diagnosis not present

## 2021-07-10 DIAGNOSIS — Z23 Encounter for immunization: Secondary | ICD-10-CM | POA: Diagnosis not present

## 2021-07-11 DIAGNOSIS — N186 End stage renal disease: Secondary | ICD-10-CM | POA: Diagnosis not present

## 2021-07-11 DIAGNOSIS — Z992 Dependence on renal dialysis: Secondary | ICD-10-CM | POA: Diagnosis not present

## 2021-07-11 DIAGNOSIS — D509 Iron deficiency anemia, unspecified: Secondary | ICD-10-CM | POA: Diagnosis not present

## 2021-07-11 DIAGNOSIS — Z23 Encounter for immunization: Secondary | ICD-10-CM | POA: Diagnosis not present

## 2021-07-11 DIAGNOSIS — D631 Anemia in chronic kidney disease: Secondary | ICD-10-CM | POA: Diagnosis not present

## 2021-07-11 DIAGNOSIS — N2581 Secondary hyperparathyroidism of renal origin: Secondary | ICD-10-CM | POA: Diagnosis not present

## 2021-07-12 DIAGNOSIS — D509 Iron deficiency anemia, unspecified: Secondary | ICD-10-CM | POA: Diagnosis not present

## 2021-07-12 DIAGNOSIS — N186 End stage renal disease: Secondary | ICD-10-CM | POA: Diagnosis not present

## 2021-07-12 DIAGNOSIS — Z23 Encounter for immunization: Secondary | ICD-10-CM | POA: Diagnosis not present

## 2021-07-12 DIAGNOSIS — Z992 Dependence on renal dialysis: Secondary | ICD-10-CM | POA: Diagnosis not present

## 2021-07-12 DIAGNOSIS — I129 Hypertensive chronic kidney disease with stage 1 through stage 4 chronic kidney disease, or unspecified chronic kidney disease: Secondary | ICD-10-CM | POA: Diagnosis not present

## 2021-07-12 DIAGNOSIS — D631 Anemia in chronic kidney disease: Secondary | ICD-10-CM | POA: Diagnosis not present

## 2021-07-12 DIAGNOSIS — N2581 Secondary hyperparathyroidism of renal origin: Secondary | ICD-10-CM | POA: Diagnosis not present

## 2021-07-13 DIAGNOSIS — N2581 Secondary hyperparathyroidism of renal origin: Secondary | ICD-10-CM | POA: Diagnosis not present

## 2021-07-13 DIAGNOSIS — D631 Anemia in chronic kidney disease: Secondary | ICD-10-CM | POA: Diagnosis not present

## 2021-07-13 DIAGNOSIS — Z23 Encounter for immunization: Secondary | ICD-10-CM | POA: Diagnosis not present

## 2021-07-13 DIAGNOSIS — Z4932 Encounter for adequacy testing for peritoneal dialysis: Secondary | ICD-10-CM | POA: Diagnosis not present

## 2021-07-13 DIAGNOSIS — N186 End stage renal disease: Secondary | ICD-10-CM | POA: Diagnosis not present

## 2021-07-13 DIAGNOSIS — Z992 Dependence on renal dialysis: Secondary | ICD-10-CM | POA: Diagnosis not present

## 2021-07-14 DIAGNOSIS — Z4932 Encounter for adequacy testing for peritoneal dialysis: Secondary | ICD-10-CM | POA: Diagnosis not present

## 2021-07-14 DIAGNOSIS — D631 Anemia in chronic kidney disease: Secondary | ICD-10-CM | POA: Diagnosis not present

## 2021-07-14 DIAGNOSIS — Z23 Encounter for immunization: Secondary | ICD-10-CM | POA: Diagnosis not present

## 2021-07-14 DIAGNOSIS — N2581 Secondary hyperparathyroidism of renal origin: Secondary | ICD-10-CM | POA: Diagnosis not present

## 2021-07-14 DIAGNOSIS — N186 End stage renal disease: Secondary | ICD-10-CM | POA: Diagnosis not present

## 2021-07-14 DIAGNOSIS — Z992 Dependence on renal dialysis: Secondary | ICD-10-CM | POA: Diagnosis not present

## 2021-07-15 DIAGNOSIS — Z23 Encounter for immunization: Secondary | ICD-10-CM | POA: Diagnosis not present

## 2021-07-15 DIAGNOSIS — N2581 Secondary hyperparathyroidism of renal origin: Secondary | ICD-10-CM | POA: Diagnosis not present

## 2021-07-15 DIAGNOSIS — D631 Anemia in chronic kidney disease: Secondary | ICD-10-CM | POA: Diagnosis not present

## 2021-07-15 DIAGNOSIS — Z992 Dependence on renal dialysis: Secondary | ICD-10-CM | POA: Diagnosis not present

## 2021-07-15 DIAGNOSIS — Z4932 Encounter for adequacy testing for peritoneal dialysis: Secondary | ICD-10-CM | POA: Diagnosis not present

## 2021-07-15 DIAGNOSIS — N186 End stage renal disease: Secondary | ICD-10-CM | POA: Diagnosis not present

## 2021-07-16 DIAGNOSIS — N186 End stage renal disease: Secondary | ICD-10-CM | POA: Diagnosis not present

## 2021-07-16 DIAGNOSIS — Z4932 Encounter for adequacy testing for peritoneal dialysis: Secondary | ICD-10-CM | POA: Diagnosis not present

## 2021-07-16 DIAGNOSIS — N2581 Secondary hyperparathyroidism of renal origin: Secondary | ICD-10-CM | POA: Diagnosis not present

## 2021-07-16 DIAGNOSIS — D631 Anemia in chronic kidney disease: Secondary | ICD-10-CM | POA: Diagnosis not present

## 2021-07-16 DIAGNOSIS — Z23 Encounter for immunization: Secondary | ICD-10-CM | POA: Diagnosis not present

## 2021-07-16 DIAGNOSIS — Z992 Dependence on renal dialysis: Secondary | ICD-10-CM | POA: Diagnosis not present

## 2021-07-17 DIAGNOSIS — N186 End stage renal disease: Secondary | ICD-10-CM | POA: Diagnosis not present

## 2021-07-17 DIAGNOSIS — Z992 Dependence on renal dialysis: Secondary | ICD-10-CM | POA: Diagnosis not present

## 2021-07-17 DIAGNOSIS — Z4932 Encounter for adequacy testing for peritoneal dialysis: Secondary | ICD-10-CM | POA: Diagnosis not present

## 2021-07-17 DIAGNOSIS — Z23 Encounter for immunization: Secondary | ICD-10-CM | POA: Diagnosis not present

## 2021-07-17 DIAGNOSIS — D631 Anemia in chronic kidney disease: Secondary | ICD-10-CM | POA: Diagnosis not present

## 2021-07-17 DIAGNOSIS — N2581 Secondary hyperparathyroidism of renal origin: Secondary | ICD-10-CM | POA: Diagnosis not present

## 2021-07-18 DIAGNOSIS — N186 End stage renal disease: Secondary | ICD-10-CM | POA: Diagnosis not present

## 2021-07-18 DIAGNOSIS — Z992 Dependence on renal dialysis: Secondary | ICD-10-CM | POA: Diagnosis not present

## 2021-07-18 DIAGNOSIS — Z4932 Encounter for adequacy testing for peritoneal dialysis: Secondary | ICD-10-CM | POA: Diagnosis not present

## 2021-07-18 DIAGNOSIS — Z23 Encounter for immunization: Secondary | ICD-10-CM | POA: Diagnosis not present

## 2021-07-18 DIAGNOSIS — D631 Anemia in chronic kidney disease: Secondary | ICD-10-CM | POA: Diagnosis not present

## 2021-07-18 DIAGNOSIS — N2581 Secondary hyperparathyroidism of renal origin: Secondary | ICD-10-CM | POA: Diagnosis not present

## 2021-07-19 DIAGNOSIS — D631 Anemia in chronic kidney disease: Secondary | ICD-10-CM | POA: Diagnosis not present

## 2021-07-19 DIAGNOSIS — N2581 Secondary hyperparathyroidism of renal origin: Secondary | ICD-10-CM | POA: Diagnosis not present

## 2021-07-19 DIAGNOSIS — Z23 Encounter for immunization: Secondary | ICD-10-CM | POA: Diagnosis not present

## 2021-07-19 DIAGNOSIS — N186 End stage renal disease: Secondary | ICD-10-CM | POA: Diagnosis not present

## 2021-07-19 DIAGNOSIS — Z4932 Encounter for adequacy testing for peritoneal dialysis: Secondary | ICD-10-CM | POA: Diagnosis not present

## 2021-07-19 DIAGNOSIS — Z992 Dependence on renal dialysis: Secondary | ICD-10-CM | POA: Diagnosis not present

## 2021-07-20 DIAGNOSIS — Z23 Encounter for immunization: Secondary | ICD-10-CM | POA: Diagnosis not present

## 2021-07-20 DIAGNOSIS — N186 End stage renal disease: Secondary | ICD-10-CM | POA: Diagnosis not present

## 2021-07-20 DIAGNOSIS — Z992 Dependence on renal dialysis: Secondary | ICD-10-CM | POA: Diagnosis not present

## 2021-07-20 DIAGNOSIS — D631 Anemia in chronic kidney disease: Secondary | ICD-10-CM | POA: Diagnosis not present

## 2021-07-20 DIAGNOSIS — N2581 Secondary hyperparathyroidism of renal origin: Secondary | ICD-10-CM | POA: Diagnosis not present

## 2021-07-20 DIAGNOSIS — Z4932 Encounter for adequacy testing for peritoneal dialysis: Secondary | ICD-10-CM | POA: Diagnosis not present

## 2021-07-21 DIAGNOSIS — N2581 Secondary hyperparathyroidism of renal origin: Secondary | ICD-10-CM | POA: Diagnosis not present

## 2021-07-21 DIAGNOSIS — Z23 Encounter for immunization: Secondary | ICD-10-CM | POA: Diagnosis not present

## 2021-07-21 DIAGNOSIS — Z4932 Encounter for adequacy testing for peritoneal dialysis: Secondary | ICD-10-CM | POA: Diagnosis not present

## 2021-07-21 DIAGNOSIS — D631 Anemia in chronic kidney disease: Secondary | ICD-10-CM | POA: Diagnosis not present

## 2021-07-21 DIAGNOSIS — N186 End stage renal disease: Secondary | ICD-10-CM | POA: Diagnosis not present

## 2021-07-21 DIAGNOSIS — Z992 Dependence on renal dialysis: Secondary | ICD-10-CM | POA: Diagnosis not present

## 2021-07-22 DIAGNOSIS — D631 Anemia in chronic kidney disease: Secondary | ICD-10-CM | POA: Diagnosis not present

## 2021-07-22 DIAGNOSIS — Z992 Dependence on renal dialysis: Secondary | ICD-10-CM | POA: Diagnosis not present

## 2021-07-22 DIAGNOSIS — N186 End stage renal disease: Secondary | ICD-10-CM | POA: Diagnosis not present

## 2021-07-22 DIAGNOSIS — N2581 Secondary hyperparathyroidism of renal origin: Secondary | ICD-10-CM | POA: Diagnosis not present

## 2021-07-22 DIAGNOSIS — Z23 Encounter for immunization: Secondary | ICD-10-CM | POA: Diagnosis not present

## 2021-07-22 DIAGNOSIS — Z4932 Encounter for adequacy testing for peritoneal dialysis: Secondary | ICD-10-CM | POA: Diagnosis not present

## 2021-07-23 DIAGNOSIS — Z4932 Encounter for adequacy testing for peritoneal dialysis: Secondary | ICD-10-CM | POA: Diagnosis not present

## 2021-07-23 DIAGNOSIS — Z992 Dependence on renal dialysis: Secondary | ICD-10-CM | POA: Diagnosis not present

## 2021-07-23 DIAGNOSIS — N186 End stage renal disease: Secondary | ICD-10-CM | POA: Diagnosis not present

## 2021-07-23 DIAGNOSIS — Z23 Encounter for immunization: Secondary | ICD-10-CM | POA: Diagnosis not present

## 2021-07-23 DIAGNOSIS — N2581 Secondary hyperparathyroidism of renal origin: Secondary | ICD-10-CM | POA: Diagnosis not present

## 2021-07-23 DIAGNOSIS — D631 Anemia in chronic kidney disease: Secondary | ICD-10-CM | POA: Diagnosis not present

## 2021-07-24 DIAGNOSIS — N2581 Secondary hyperparathyroidism of renal origin: Secondary | ICD-10-CM | POA: Diagnosis not present

## 2021-07-24 DIAGNOSIS — Z4932 Encounter for adequacy testing for peritoneal dialysis: Secondary | ICD-10-CM | POA: Diagnosis not present

## 2021-07-24 DIAGNOSIS — Z992 Dependence on renal dialysis: Secondary | ICD-10-CM | POA: Diagnosis not present

## 2021-07-24 DIAGNOSIS — D631 Anemia in chronic kidney disease: Secondary | ICD-10-CM | POA: Diagnosis not present

## 2021-07-24 DIAGNOSIS — Z23 Encounter for immunization: Secondary | ICD-10-CM | POA: Diagnosis not present

## 2021-07-24 DIAGNOSIS — N186 End stage renal disease: Secondary | ICD-10-CM | POA: Diagnosis not present

## 2021-07-25 DIAGNOSIS — N2581 Secondary hyperparathyroidism of renal origin: Secondary | ICD-10-CM | POA: Diagnosis not present

## 2021-07-25 DIAGNOSIS — Z4932 Encounter for adequacy testing for peritoneal dialysis: Secondary | ICD-10-CM | POA: Diagnosis not present

## 2021-07-25 DIAGNOSIS — Z23 Encounter for immunization: Secondary | ICD-10-CM | POA: Diagnosis not present

## 2021-07-25 DIAGNOSIS — D631 Anemia in chronic kidney disease: Secondary | ICD-10-CM | POA: Diagnosis not present

## 2021-07-25 DIAGNOSIS — C44219 Basal cell carcinoma of skin of left ear and external auricular canal: Secondary | ICD-10-CM | POA: Diagnosis not present

## 2021-07-25 DIAGNOSIS — Z992 Dependence on renal dialysis: Secondary | ICD-10-CM | POA: Diagnosis not present

## 2021-07-25 DIAGNOSIS — N186 End stage renal disease: Secondary | ICD-10-CM | POA: Diagnosis not present

## 2021-07-26 DIAGNOSIS — N186 End stage renal disease: Secondary | ICD-10-CM | POA: Diagnosis not present

## 2021-07-26 DIAGNOSIS — Z992 Dependence on renal dialysis: Secondary | ICD-10-CM | POA: Diagnosis not present

## 2021-07-26 DIAGNOSIS — N2581 Secondary hyperparathyroidism of renal origin: Secondary | ICD-10-CM | POA: Diagnosis not present

## 2021-07-26 DIAGNOSIS — Z23 Encounter for immunization: Secondary | ICD-10-CM | POA: Diagnosis not present

## 2021-07-26 DIAGNOSIS — D631 Anemia in chronic kidney disease: Secondary | ICD-10-CM | POA: Diagnosis not present

## 2021-07-26 DIAGNOSIS — Z4932 Encounter for adequacy testing for peritoneal dialysis: Secondary | ICD-10-CM | POA: Diagnosis not present

## 2021-07-27 DIAGNOSIS — Z992 Dependence on renal dialysis: Secondary | ICD-10-CM | POA: Diagnosis not present

## 2021-07-27 DIAGNOSIS — N186 End stage renal disease: Secondary | ICD-10-CM | POA: Diagnosis not present

## 2021-07-27 DIAGNOSIS — Z23 Encounter for immunization: Secondary | ICD-10-CM | POA: Diagnosis not present

## 2021-07-27 DIAGNOSIS — N2581 Secondary hyperparathyroidism of renal origin: Secondary | ICD-10-CM | POA: Diagnosis not present

## 2021-07-27 DIAGNOSIS — Z4932 Encounter for adequacy testing for peritoneal dialysis: Secondary | ICD-10-CM | POA: Diagnosis not present

## 2021-07-27 DIAGNOSIS — D631 Anemia in chronic kidney disease: Secondary | ICD-10-CM | POA: Diagnosis not present

## 2021-07-28 DIAGNOSIS — N186 End stage renal disease: Secondary | ICD-10-CM | POA: Diagnosis not present

## 2021-07-28 DIAGNOSIS — Z992 Dependence on renal dialysis: Secondary | ICD-10-CM | POA: Diagnosis not present

## 2021-07-28 DIAGNOSIS — D631 Anemia in chronic kidney disease: Secondary | ICD-10-CM | POA: Diagnosis not present

## 2021-07-28 DIAGNOSIS — N2581 Secondary hyperparathyroidism of renal origin: Secondary | ICD-10-CM | POA: Diagnosis not present

## 2021-07-28 DIAGNOSIS — Z4932 Encounter for adequacy testing for peritoneal dialysis: Secondary | ICD-10-CM | POA: Diagnosis not present

## 2021-07-28 DIAGNOSIS — Z23 Encounter for immunization: Secondary | ICD-10-CM | POA: Diagnosis not present

## 2021-07-29 DIAGNOSIS — Z992 Dependence on renal dialysis: Secondary | ICD-10-CM | POA: Diagnosis not present

## 2021-07-29 DIAGNOSIS — N2581 Secondary hyperparathyroidism of renal origin: Secondary | ICD-10-CM | POA: Diagnosis not present

## 2021-07-29 DIAGNOSIS — Z23 Encounter for immunization: Secondary | ICD-10-CM | POA: Diagnosis not present

## 2021-07-29 DIAGNOSIS — N186 End stage renal disease: Secondary | ICD-10-CM | POA: Diagnosis not present

## 2021-07-29 DIAGNOSIS — Z4932 Encounter for adequacy testing for peritoneal dialysis: Secondary | ICD-10-CM | POA: Diagnosis not present

## 2021-07-29 DIAGNOSIS — D631 Anemia in chronic kidney disease: Secondary | ICD-10-CM | POA: Diagnosis not present

## 2021-07-30 DIAGNOSIS — Z4932 Encounter for adequacy testing for peritoneal dialysis: Secondary | ICD-10-CM | POA: Diagnosis not present

## 2021-07-30 DIAGNOSIS — Z992 Dependence on renal dialysis: Secondary | ICD-10-CM | POA: Diagnosis not present

## 2021-07-30 DIAGNOSIS — N186 End stage renal disease: Secondary | ICD-10-CM | POA: Diagnosis not present

## 2021-07-30 DIAGNOSIS — Z23 Encounter for immunization: Secondary | ICD-10-CM | POA: Diagnosis not present

## 2021-07-30 DIAGNOSIS — N2581 Secondary hyperparathyroidism of renal origin: Secondary | ICD-10-CM | POA: Diagnosis not present

## 2021-07-30 DIAGNOSIS — D631 Anemia in chronic kidney disease: Secondary | ICD-10-CM | POA: Diagnosis not present

## 2021-07-31 DIAGNOSIS — Z4932 Encounter for adequacy testing for peritoneal dialysis: Secondary | ICD-10-CM | POA: Diagnosis not present

## 2021-07-31 DIAGNOSIS — Z23 Encounter for immunization: Secondary | ICD-10-CM | POA: Diagnosis not present

## 2021-07-31 DIAGNOSIS — Z992 Dependence on renal dialysis: Secondary | ICD-10-CM | POA: Diagnosis not present

## 2021-07-31 DIAGNOSIS — N186 End stage renal disease: Secondary | ICD-10-CM | POA: Diagnosis not present

## 2021-07-31 DIAGNOSIS — D631 Anemia in chronic kidney disease: Secondary | ICD-10-CM | POA: Diagnosis not present

## 2021-07-31 DIAGNOSIS — N2581 Secondary hyperparathyroidism of renal origin: Secondary | ICD-10-CM | POA: Diagnosis not present

## 2021-08-01 DIAGNOSIS — D631 Anemia in chronic kidney disease: Secondary | ICD-10-CM | POA: Diagnosis not present

## 2021-08-01 DIAGNOSIS — Z23 Encounter for immunization: Secondary | ICD-10-CM | POA: Diagnosis not present

## 2021-08-01 DIAGNOSIS — N186 End stage renal disease: Secondary | ICD-10-CM | POA: Diagnosis not present

## 2021-08-01 DIAGNOSIS — Z992 Dependence on renal dialysis: Secondary | ICD-10-CM | POA: Diagnosis not present

## 2021-08-01 DIAGNOSIS — N2581 Secondary hyperparathyroidism of renal origin: Secondary | ICD-10-CM | POA: Diagnosis not present

## 2021-08-01 DIAGNOSIS — Z4932 Encounter for adequacy testing for peritoneal dialysis: Secondary | ICD-10-CM | POA: Diagnosis not present

## 2021-08-02 DIAGNOSIS — Z23 Encounter for immunization: Secondary | ICD-10-CM | POA: Diagnosis not present

## 2021-08-02 DIAGNOSIS — Z992 Dependence on renal dialysis: Secondary | ICD-10-CM | POA: Diagnosis not present

## 2021-08-02 DIAGNOSIS — Z4932 Encounter for adequacy testing for peritoneal dialysis: Secondary | ICD-10-CM | POA: Diagnosis not present

## 2021-08-02 DIAGNOSIS — N186 End stage renal disease: Secondary | ICD-10-CM | POA: Diagnosis not present

## 2021-08-02 DIAGNOSIS — N2581 Secondary hyperparathyroidism of renal origin: Secondary | ICD-10-CM | POA: Diagnosis not present

## 2021-08-02 DIAGNOSIS — D631 Anemia in chronic kidney disease: Secondary | ICD-10-CM | POA: Diagnosis not present

## 2021-08-03 DIAGNOSIS — N186 End stage renal disease: Secondary | ICD-10-CM | POA: Diagnosis not present

## 2021-08-03 DIAGNOSIS — Z992 Dependence on renal dialysis: Secondary | ICD-10-CM | POA: Diagnosis not present

## 2021-08-03 DIAGNOSIS — Z23 Encounter for immunization: Secondary | ICD-10-CM | POA: Diagnosis not present

## 2021-08-03 DIAGNOSIS — N2581 Secondary hyperparathyroidism of renal origin: Secondary | ICD-10-CM | POA: Diagnosis not present

## 2021-08-03 DIAGNOSIS — Z4932 Encounter for adequacy testing for peritoneal dialysis: Secondary | ICD-10-CM | POA: Diagnosis not present

## 2021-08-03 DIAGNOSIS — D631 Anemia in chronic kidney disease: Secondary | ICD-10-CM | POA: Diagnosis not present

## 2021-08-04 DIAGNOSIS — N186 End stage renal disease: Secondary | ICD-10-CM | POA: Diagnosis not present

## 2021-08-04 DIAGNOSIS — D631 Anemia in chronic kidney disease: Secondary | ICD-10-CM | POA: Diagnosis not present

## 2021-08-04 DIAGNOSIS — Z23 Encounter for immunization: Secondary | ICD-10-CM | POA: Diagnosis not present

## 2021-08-04 DIAGNOSIS — Z4932 Encounter for adequacy testing for peritoneal dialysis: Secondary | ICD-10-CM | POA: Diagnosis not present

## 2021-08-04 DIAGNOSIS — N2581 Secondary hyperparathyroidism of renal origin: Secondary | ICD-10-CM | POA: Diagnosis not present

## 2021-08-04 DIAGNOSIS — Z992 Dependence on renal dialysis: Secondary | ICD-10-CM | POA: Diagnosis not present

## 2021-08-05 DIAGNOSIS — D631 Anemia in chronic kidney disease: Secondary | ICD-10-CM | POA: Diagnosis not present

## 2021-08-05 DIAGNOSIS — Z23 Encounter for immunization: Secondary | ICD-10-CM | POA: Diagnosis not present

## 2021-08-05 DIAGNOSIS — Z4932 Encounter for adequacy testing for peritoneal dialysis: Secondary | ICD-10-CM | POA: Diagnosis not present

## 2021-08-05 DIAGNOSIS — Z992 Dependence on renal dialysis: Secondary | ICD-10-CM | POA: Diagnosis not present

## 2021-08-05 DIAGNOSIS — N186 End stage renal disease: Secondary | ICD-10-CM | POA: Diagnosis not present

## 2021-08-05 DIAGNOSIS — N2581 Secondary hyperparathyroidism of renal origin: Secondary | ICD-10-CM | POA: Diagnosis not present

## 2021-08-06 DIAGNOSIS — D631 Anemia in chronic kidney disease: Secondary | ICD-10-CM | POA: Diagnosis not present

## 2021-08-06 DIAGNOSIS — N2581 Secondary hyperparathyroidism of renal origin: Secondary | ICD-10-CM | POA: Diagnosis not present

## 2021-08-06 DIAGNOSIS — N186 End stage renal disease: Secondary | ICD-10-CM | POA: Diagnosis not present

## 2021-08-06 DIAGNOSIS — Z23 Encounter for immunization: Secondary | ICD-10-CM | POA: Diagnosis not present

## 2021-08-06 DIAGNOSIS — Z4932 Encounter for adequacy testing for peritoneal dialysis: Secondary | ICD-10-CM | POA: Diagnosis not present

## 2021-08-06 DIAGNOSIS — Z992 Dependence on renal dialysis: Secondary | ICD-10-CM | POA: Diagnosis not present

## 2021-08-07 DIAGNOSIS — Z4932 Encounter for adequacy testing for peritoneal dialysis: Secondary | ICD-10-CM | POA: Diagnosis not present

## 2021-08-07 DIAGNOSIS — N2581 Secondary hyperparathyroidism of renal origin: Secondary | ICD-10-CM | POA: Diagnosis not present

## 2021-08-07 DIAGNOSIS — D631 Anemia in chronic kidney disease: Secondary | ICD-10-CM | POA: Diagnosis not present

## 2021-08-07 DIAGNOSIS — Z23 Encounter for immunization: Secondary | ICD-10-CM | POA: Diagnosis not present

## 2021-08-07 DIAGNOSIS — Z992 Dependence on renal dialysis: Secondary | ICD-10-CM | POA: Diagnosis not present

## 2021-08-07 DIAGNOSIS — N186 End stage renal disease: Secondary | ICD-10-CM | POA: Diagnosis not present

## 2021-08-08 DIAGNOSIS — Z4932 Encounter for adequacy testing for peritoneal dialysis: Secondary | ICD-10-CM | POA: Diagnosis not present

## 2021-08-08 DIAGNOSIS — Z23 Encounter for immunization: Secondary | ICD-10-CM | POA: Diagnosis not present

## 2021-08-08 DIAGNOSIS — N2581 Secondary hyperparathyroidism of renal origin: Secondary | ICD-10-CM | POA: Diagnosis not present

## 2021-08-08 DIAGNOSIS — Z992 Dependence on renal dialysis: Secondary | ICD-10-CM | POA: Diagnosis not present

## 2021-08-08 DIAGNOSIS — D631 Anemia in chronic kidney disease: Secondary | ICD-10-CM | POA: Diagnosis not present

## 2021-08-08 DIAGNOSIS — N186 End stage renal disease: Secondary | ICD-10-CM | POA: Diagnosis not present

## 2021-08-09 DIAGNOSIS — Z992 Dependence on renal dialysis: Secondary | ICD-10-CM | POA: Diagnosis not present

## 2021-08-09 DIAGNOSIS — N186 End stage renal disease: Secondary | ICD-10-CM | POA: Diagnosis not present

## 2021-08-09 DIAGNOSIS — D631 Anemia in chronic kidney disease: Secondary | ICD-10-CM | POA: Diagnosis not present

## 2021-08-09 DIAGNOSIS — Z23 Encounter for immunization: Secondary | ICD-10-CM | POA: Diagnosis not present

## 2021-08-09 DIAGNOSIS — N2581 Secondary hyperparathyroidism of renal origin: Secondary | ICD-10-CM | POA: Diagnosis not present

## 2021-08-09 DIAGNOSIS — Z4932 Encounter for adequacy testing for peritoneal dialysis: Secondary | ICD-10-CM | POA: Diagnosis not present

## 2021-08-10 DIAGNOSIS — N186 End stage renal disease: Secondary | ICD-10-CM | POA: Diagnosis not present

## 2021-08-10 DIAGNOSIS — D631 Anemia in chronic kidney disease: Secondary | ICD-10-CM | POA: Diagnosis not present

## 2021-08-10 DIAGNOSIS — N2581 Secondary hyperparathyroidism of renal origin: Secondary | ICD-10-CM | POA: Diagnosis not present

## 2021-08-10 DIAGNOSIS — Z992 Dependence on renal dialysis: Secondary | ICD-10-CM | POA: Diagnosis not present

## 2021-08-10 DIAGNOSIS — Z4932 Encounter for adequacy testing for peritoneal dialysis: Secondary | ICD-10-CM | POA: Diagnosis not present

## 2021-08-10 DIAGNOSIS — Z23 Encounter for immunization: Secondary | ICD-10-CM | POA: Diagnosis not present

## 2021-08-11 DIAGNOSIS — Z23 Encounter for immunization: Secondary | ICD-10-CM | POA: Diagnosis not present

## 2021-08-11 DIAGNOSIS — N2581 Secondary hyperparathyroidism of renal origin: Secondary | ICD-10-CM | POA: Diagnosis not present

## 2021-08-11 DIAGNOSIS — Z992 Dependence on renal dialysis: Secondary | ICD-10-CM | POA: Diagnosis not present

## 2021-08-11 DIAGNOSIS — D631 Anemia in chronic kidney disease: Secondary | ICD-10-CM | POA: Diagnosis not present

## 2021-08-11 DIAGNOSIS — Z4932 Encounter for adequacy testing for peritoneal dialysis: Secondary | ICD-10-CM | POA: Diagnosis not present

## 2021-08-11 DIAGNOSIS — N186 End stage renal disease: Secondary | ICD-10-CM | POA: Diagnosis not present

## 2021-08-11 DIAGNOSIS — I129 Hypertensive chronic kidney disease with stage 1 through stage 4 chronic kidney disease, or unspecified chronic kidney disease: Secondary | ICD-10-CM | POA: Diagnosis not present

## 2021-08-12 DIAGNOSIS — Z4932 Encounter for adequacy testing for peritoneal dialysis: Secondary | ICD-10-CM | POA: Diagnosis not present

## 2021-08-12 DIAGNOSIS — Z992 Dependence on renal dialysis: Secondary | ICD-10-CM | POA: Diagnosis not present

## 2021-08-12 DIAGNOSIS — N2581 Secondary hyperparathyroidism of renal origin: Secondary | ICD-10-CM | POA: Diagnosis not present

## 2021-08-12 DIAGNOSIS — D509 Iron deficiency anemia, unspecified: Secondary | ICD-10-CM | POA: Diagnosis not present

## 2021-08-12 DIAGNOSIS — N186 End stage renal disease: Secondary | ICD-10-CM | POA: Diagnosis not present

## 2021-08-12 DIAGNOSIS — D631 Anemia in chronic kidney disease: Secondary | ICD-10-CM | POA: Diagnosis not present

## 2021-08-13 DIAGNOSIS — Z992 Dependence on renal dialysis: Secondary | ICD-10-CM | POA: Diagnosis not present

## 2021-08-13 DIAGNOSIS — D631 Anemia in chronic kidney disease: Secondary | ICD-10-CM | POA: Diagnosis not present

## 2021-08-13 DIAGNOSIS — Z4932 Encounter for adequacy testing for peritoneal dialysis: Secondary | ICD-10-CM | POA: Diagnosis not present

## 2021-08-13 DIAGNOSIS — N186 End stage renal disease: Secondary | ICD-10-CM | POA: Diagnosis not present

## 2021-08-13 DIAGNOSIS — D509 Iron deficiency anemia, unspecified: Secondary | ICD-10-CM | POA: Diagnosis not present

## 2021-08-13 DIAGNOSIS — N2581 Secondary hyperparathyroidism of renal origin: Secondary | ICD-10-CM | POA: Diagnosis not present

## 2021-08-14 DIAGNOSIS — D509 Iron deficiency anemia, unspecified: Secondary | ICD-10-CM | POA: Diagnosis not present

## 2021-08-14 DIAGNOSIS — N2581 Secondary hyperparathyroidism of renal origin: Secondary | ICD-10-CM | POA: Diagnosis not present

## 2021-08-14 DIAGNOSIS — D631 Anemia in chronic kidney disease: Secondary | ICD-10-CM | POA: Diagnosis not present

## 2021-08-14 DIAGNOSIS — Z4932 Encounter for adequacy testing for peritoneal dialysis: Secondary | ICD-10-CM | POA: Diagnosis not present

## 2021-08-14 DIAGNOSIS — N186 End stage renal disease: Secondary | ICD-10-CM | POA: Diagnosis not present

## 2021-08-14 DIAGNOSIS — Z992 Dependence on renal dialysis: Secondary | ICD-10-CM | POA: Diagnosis not present

## 2021-08-15 DIAGNOSIS — D509 Iron deficiency anemia, unspecified: Secondary | ICD-10-CM | POA: Diagnosis not present

## 2021-08-15 DIAGNOSIS — N186 End stage renal disease: Secondary | ICD-10-CM | POA: Diagnosis not present

## 2021-08-15 DIAGNOSIS — Z992 Dependence on renal dialysis: Secondary | ICD-10-CM | POA: Diagnosis not present

## 2021-08-15 DIAGNOSIS — D631 Anemia in chronic kidney disease: Secondary | ICD-10-CM | POA: Diagnosis not present

## 2021-08-15 DIAGNOSIS — N2581 Secondary hyperparathyroidism of renal origin: Secondary | ICD-10-CM | POA: Diagnosis not present

## 2021-08-15 DIAGNOSIS — Z4932 Encounter for adequacy testing for peritoneal dialysis: Secondary | ICD-10-CM | POA: Diagnosis not present

## 2021-08-16 DIAGNOSIS — N2581 Secondary hyperparathyroidism of renal origin: Secondary | ICD-10-CM | POA: Diagnosis not present

## 2021-08-16 DIAGNOSIS — D509 Iron deficiency anemia, unspecified: Secondary | ICD-10-CM | POA: Diagnosis not present

## 2021-08-16 DIAGNOSIS — Z992 Dependence on renal dialysis: Secondary | ICD-10-CM | POA: Diagnosis not present

## 2021-08-16 DIAGNOSIS — D631 Anemia in chronic kidney disease: Secondary | ICD-10-CM | POA: Diagnosis not present

## 2021-08-16 DIAGNOSIS — N186 End stage renal disease: Secondary | ICD-10-CM | POA: Diagnosis not present

## 2021-08-16 DIAGNOSIS — Z4932 Encounter for adequacy testing for peritoneal dialysis: Secondary | ICD-10-CM | POA: Diagnosis not present

## 2021-08-17 DIAGNOSIS — N2581 Secondary hyperparathyroidism of renal origin: Secondary | ICD-10-CM | POA: Diagnosis not present

## 2021-08-17 DIAGNOSIS — N186 End stage renal disease: Secondary | ICD-10-CM | POA: Diagnosis not present

## 2021-08-17 DIAGNOSIS — Z992 Dependence on renal dialysis: Secondary | ICD-10-CM | POA: Diagnosis not present

## 2021-08-17 DIAGNOSIS — D631 Anemia in chronic kidney disease: Secondary | ICD-10-CM | POA: Diagnosis not present

## 2021-08-17 DIAGNOSIS — D509 Iron deficiency anemia, unspecified: Secondary | ICD-10-CM | POA: Diagnosis not present

## 2021-08-17 DIAGNOSIS — Z4932 Encounter for adequacy testing for peritoneal dialysis: Secondary | ICD-10-CM | POA: Diagnosis not present

## 2021-08-18 DIAGNOSIS — N186 End stage renal disease: Secondary | ICD-10-CM | POA: Diagnosis not present

## 2021-08-18 DIAGNOSIS — Z992 Dependence on renal dialysis: Secondary | ICD-10-CM | POA: Diagnosis not present

## 2021-08-18 DIAGNOSIS — Z4932 Encounter for adequacy testing for peritoneal dialysis: Secondary | ICD-10-CM | POA: Diagnosis not present

## 2021-08-18 DIAGNOSIS — N2581 Secondary hyperparathyroidism of renal origin: Secondary | ICD-10-CM | POA: Diagnosis not present

## 2021-08-18 DIAGNOSIS — D509 Iron deficiency anemia, unspecified: Secondary | ICD-10-CM | POA: Diagnosis not present

## 2021-08-18 DIAGNOSIS — D631 Anemia in chronic kidney disease: Secondary | ICD-10-CM | POA: Diagnosis not present

## 2021-08-19 DIAGNOSIS — D631 Anemia in chronic kidney disease: Secondary | ICD-10-CM | POA: Diagnosis not present

## 2021-08-19 DIAGNOSIS — Z4932 Encounter for adequacy testing for peritoneal dialysis: Secondary | ICD-10-CM | POA: Diagnosis not present

## 2021-08-19 DIAGNOSIS — D509 Iron deficiency anemia, unspecified: Secondary | ICD-10-CM | POA: Diagnosis not present

## 2021-08-19 DIAGNOSIS — N186 End stage renal disease: Secondary | ICD-10-CM | POA: Diagnosis not present

## 2021-08-19 DIAGNOSIS — Z992 Dependence on renal dialysis: Secondary | ICD-10-CM | POA: Diagnosis not present

## 2021-08-19 DIAGNOSIS — N2581 Secondary hyperparathyroidism of renal origin: Secondary | ICD-10-CM | POA: Diagnosis not present

## 2021-08-20 DIAGNOSIS — N2581 Secondary hyperparathyroidism of renal origin: Secondary | ICD-10-CM | POA: Diagnosis not present

## 2021-08-20 DIAGNOSIS — Z992 Dependence on renal dialysis: Secondary | ICD-10-CM | POA: Diagnosis not present

## 2021-08-20 DIAGNOSIS — Z4932 Encounter for adequacy testing for peritoneal dialysis: Secondary | ICD-10-CM | POA: Diagnosis not present

## 2021-08-20 DIAGNOSIS — N186 End stage renal disease: Secondary | ICD-10-CM | POA: Diagnosis not present

## 2021-08-20 DIAGNOSIS — D509 Iron deficiency anemia, unspecified: Secondary | ICD-10-CM | POA: Diagnosis not present

## 2021-08-20 DIAGNOSIS — D631 Anemia in chronic kidney disease: Secondary | ICD-10-CM | POA: Diagnosis not present

## 2021-08-21 DIAGNOSIS — D631 Anemia in chronic kidney disease: Secondary | ICD-10-CM | POA: Diagnosis not present

## 2021-08-21 DIAGNOSIS — Z4932 Encounter for adequacy testing for peritoneal dialysis: Secondary | ICD-10-CM | POA: Diagnosis not present

## 2021-08-21 DIAGNOSIS — Z992 Dependence on renal dialysis: Secondary | ICD-10-CM | POA: Diagnosis not present

## 2021-08-21 DIAGNOSIS — D509 Iron deficiency anemia, unspecified: Secondary | ICD-10-CM | POA: Diagnosis not present

## 2021-08-21 DIAGNOSIS — N2581 Secondary hyperparathyroidism of renal origin: Secondary | ICD-10-CM | POA: Diagnosis not present

## 2021-08-21 DIAGNOSIS — N186 End stage renal disease: Secondary | ICD-10-CM | POA: Diagnosis not present

## 2021-08-22 DIAGNOSIS — N186 End stage renal disease: Secondary | ICD-10-CM | POA: Diagnosis not present

## 2021-08-22 DIAGNOSIS — D631 Anemia in chronic kidney disease: Secondary | ICD-10-CM | POA: Diagnosis not present

## 2021-08-22 DIAGNOSIS — D509 Iron deficiency anemia, unspecified: Secondary | ICD-10-CM | POA: Diagnosis not present

## 2021-08-22 DIAGNOSIS — N2581 Secondary hyperparathyroidism of renal origin: Secondary | ICD-10-CM | POA: Diagnosis not present

## 2021-08-22 DIAGNOSIS — Z4932 Encounter for adequacy testing for peritoneal dialysis: Secondary | ICD-10-CM | POA: Diagnosis not present

## 2021-08-22 DIAGNOSIS — Z992 Dependence on renal dialysis: Secondary | ICD-10-CM | POA: Diagnosis not present

## 2021-08-23 DIAGNOSIS — N186 End stage renal disease: Secondary | ICD-10-CM | POA: Diagnosis not present

## 2021-08-23 DIAGNOSIS — Z992 Dependence on renal dialysis: Secondary | ICD-10-CM | POA: Diagnosis not present

## 2021-08-23 DIAGNOSIS — Z4932 Encounter for adequacy testing for peritoneal dialysis: Secondary | ICD-10-CM | POA: Diagnosis not present

## 2021-08-23 DIAGNOSIS — D631 Anemia in chronic kidney disease: Secondary | ICD-10-CM | POA: Diagnosis not present

## 2021-08-23 DIAGNOSIS — D509 Iron deficiency anemia, unspecified: Secondary | ICD-10-CM | POA: Diagnosis not present

## 2021-08-23 DIAGNOSIS — N2581 Secondary hyperparathyroidism of renal origin: Secondary | ICD-10-CM | POA: Diagnosis not present

## 2021-08-24 DIAGNOSIS — Z992 Dependence on renal dialysis: Secondary | ICD-10-CM | POA: Diagnosis not present

## 2021-08-24 DIAGNOSIS — Z4932 Encounter for adequacy testing for peritoneal dialysis: Secondary | ICD-10-CM | POA: Diagnosis not present

## 2021-08-24 DIAGNOSIS — D631 Anemia in chronic kidney disease: Secondary | ICD-10-CM | POA: Diagnosis not present

## 2021-08-24 DIAGNOSIS — N2581 Secondary hyperparathyroidism of renal origin: Secondary | ICD-10-CM | POA: Diagnosis not present

## 2021-08-24 DIAGNOSIS — D509 Iron deficiency anemia, unspecified: Secondary | ICD-10-CM | POA: Diagnosis not present

## 2021-08-24 DIAGNOSIS — N186 End stage renal disease: Secondary | ICD-10-CM | POA: Diagnosis not present

## 2021-08-25 DIAGNOSIS — Z4932 Encounter for adequacy testing for peritoneal dialysis: Secondary | ICD-10-CM | POA: Diagnosis not present

## 2021-08-25 DIAGNOSIS — D509 Iron deficiency anemia, unspecified: Secondary | ICD-10-CM | POA: Diagnosis not present

## 2021-08-25 DIAGNOSIS — N186 End stage renal disease: Secondary | ICD-10-CM | POA: Diagnosis not present

## 2021-08-25 DIAGNOSIS — D631 Anemia in chronic kidney disease: Secondary | ICD-10-CM | POA: Diagnosis not present

## 2021-08-25 DIAGNOSIS — N2581 Secondary hyperparathyroidism of renal origin: Secondary | ICD-10-CM | POA: Diagnosis not present

## 2021-08-25 DIAGNOSIS — Z992 Dependence on renal dialysis: Secondary | ICD-10-CM | POA: Diagnosis not present

## 2021-08-26 DIAGNOSIS — Z992 Dependence on renal dialysis: Secondary | ICD-10-CM | POA: Diagnosis not present

## 2021-08-26 DIAGNOSIS — Z4932 Encounter for adequacy testing for peritoneal dialysis: Secondary | ICD-10-CM | POA: Diagnosis not present

## 2021-08-26 DIAGNOSIS — N2581 Secondary hyperparathyroidism of renal origin: Secondary | ICD-10-CM | POA: Diagnosis not present

## 2021-08-26 DIAGNOSIS — D509 Iron deficiency anemia, unspecified: Secondary | ICD-10-CM | POA: Diagnosis not present

## 2021-08-26 DIAGNOSIS — D631 Anemia in chronic kidney disease: Secondary | ICD-10-CM | POA: Diagnosis not present

## 2021-08-26 DIAGNOSIS — N186 End stage renal disease: Secondary | ICD-10-CM | POA: Diagnosis not present

## 2021-08-27 DIAGNOSIS — D509 Iron deficiency anemia, unspecified: Secondary | ICD-10-CM | POA: Diagnosis not present

## 2021-08-27 DIAGNOSIS — N186 End stage renal disease: Secondary | ICD-10-CM | POA: Diagnosis not present

## 2021-08-27 DIAGNOSIS — D631 Anemia in chronic kidney disease: Secondary | ICD-10-CM | POA: Diagnosis not present

## 2021-08-27 DIAGNOSIS — Z4932 Encounter for adequacy testing for peritoneal dialysis: Secondary | ICD-10-CM | POA: Diagnosis not present

## 2021-08-27 DIAGNOSIS — Z992 Dependence on renal dialysis: Secondary | ICD-10-CM | POA: Diagnosis not present

## 2021-08-27 DIAGNOSIS — N2581 Secondary hyperparathyroidism of renal origin: Secondary | ICD-10-CM | POA: Diagnosis not present

## 2021-08-28 DIAGNOSIS — N2581 Secondary hyperparathyroidism of renal origin: Secondary | ICD-10-CM | POA: Diagnosis not present

## 2021-08-28 DIAGNOSIS — Z4932 Encounter for adequacy testing for peritoneal dialysis: Secondary | ICD-10-CM | POA: Diagnosis not present

## 2021-08-28 DIAGNOSIS — Z992 Dependence on renal dialysis: Secondary | ICD-10-CM | POA: Diagnosis not present

## 2021-08-28 DIAGNOSIS — D509 Iron deficiency anemia, unspecified: Secondary | ICD-10-CM | POA: Diagnosis not present

## 2021-08-28 DIAGNOSIS — D631 Anemia in chronic kidney disease: Secondary | ICD-10-CM | POA: Diagnosis not present

## 2021-08-28 DIAGNOSIS — N186 End stage renal disease: Secondary | ICD-10-CM | POA: Diagnosis not present

## 2021-08-29 DIAGNOSIS — Z4932 Encounter for adequacy testing for peritoneal dialysis: Secondary | ICD-10-CM | POA: Diagnosis not present

## 2021-08-29 DIAGNOSIS — Z992 Dependence on renal dialysis: Secondary | ICD-10-CM | POA: Diagnosis not present

## 2021-08-29 DIAGNOSIS — N186 End stage renal disease: Secondary | ICD-10-CM | POA: Diagnosis not present

## 2021-08-29 DIAGNOSIS — D509 Iron deficiency anemia, unspecified: Secondary | ICD-10-CM | POA: Diagnosis not present

## 2021-08-29 DIAGNOSIS — N2581 Secondary hyperparathyroidism of renal origin: Secondary | ICD-10-CM | POA: Diagnosis not present

## 2021-08-29 DIAGNOSIS — D631 Anemia in chronic kidney disease: Secondary | ICD-10-CM | POA: Diagnosis not present

## 2021-08-30 DIAGNOSIS — Z992 Dependence on renal dialysis: Secondary | ICD-10-CM | POA: Diagnosis not present

## 2021-08-30 DIAGNOSIS — Z4932 Encounter for adequacy testing for peritoneal dialysis: Secondary | ICD-10-CM | POA: Diagnosis not present

## 2021-08-30 DIAGNOSIS — D631 Anemia in chronic kidney disease: Secondary | ICD-10-CM | POA: Diagnosis not present

## 2021-08-30 DIAGNOSIS — N186 End stage renal disease: Secondary | ICD-10-CM | POA: Diagnosis not present

## 2021-08-30 DIAGNOSIS — N2581 Secondary hyperparathyroidism of renal origin: Secondary | ICD-10-CM | POA: Diagnosis not present

## 2021-08-30 DIAGNOSIS — D509 Iron deficiency anemia, unspecified: Secondary | ICD-10-CM | POA: Diagnosis not present

## 2021-08-31 DIAGNOSIS — D631 Anemia in chronic kidney disease: Secondary | ICD-10-CM | POA: Diagnosis not present

## 2021-08-31 DIAGNOSIS — D509 Iron deficiency anemia, unspecified: Secondary | ICD-10-CM | POA: Diagnosis not present

## 2021-08-31 DIAGNOSIS — Z992 Dependence on renal dialysis: Secondary | ICD-10-CM | POA: Diagnosis not present

## 2021-08-31 DIAGNOSIS — Z4932 Encounter for adequacy testing for peritoneal dialysis: Secondary | ICD-10-CM | POA: Diagnosis not present

## 2021-08-31 DIAGNOSIS — N2581 Secondary hyperparathyroidism of renal origin: Secondary | ICD-10-CM | POA: Diagnosis not present

## 2021-08-31 DIAGNOSIS — N186 End stage renal disease: Secondary | ICD-10-CM | POA: Diagnosis not present

## 2021-09-01 DIAGNOSIS — D509 Iron deficiency anemia, unspecified: Secondary | ICD-10-CM | POA: Diagnosis not present

## 2021-09-01 DIAGNOSIS — N2581 Secondary hyperparathyroidism of renal origin: Secondary | ICD-10-CM | POA: Diagnosis not present

## 2021-09-01 DIAGNOSIS — N186 End stage renal disease: Secondary | ICD-10-CM | POA: Diagnosis not present

## 2021-09-01 DIAGNOSIS — Z992 Dependence on renal dialysis: Secondary | ICD-10-CM | POA: Diagnosis not present

## 2021-09-01 DIAGNOSIS — D631 Anemia in chronic kidney disease: Secondary | ICD-10-CM | POA: Diagnosis not present

## 2021-09-01 DIAGNOSIS — Z4932 Encounter for adequacy testing for peritoneal dialysis: Secondary | ICD-10-CM | POA: Diagnosis not present

## 2021-09-02 DIAGNOSIS — D631 Anemia in chronic kidney disease: Secondary | ICD-10-CM | POA: Diagnosis not present

## 2021-09-02 DIAGNOSIS — Z4932 Encounter for adequacy testing for peritoneal dialysis: Secondary | ICD-10-CM | POA: Diagnosis not present

## 2021-09-02 DIAGNOSIS — N2581 Secondary hyperparathyroidism of renal origin: Secondary | ICD-10-CM | POA: Diagnosis not present

## 2021-09-02 DIAGNOSIS — Z992 Dependence on renal dialysis: Secondary | ICD-10-CM | POA: Diagnosis not present

## 2021-09-02 DIAGNOSIS — N186 End stage renal disease: Secondary | ICD-10-CM | POA: Diagnosis not present

## 2021-09-02 DIAGNOSIS — D509 Iron deficiency anemia, unspecified: Secondary | ICD-10-CM | POA: Diagnosis not present

## 2021-09-03 DIAGNOSIS — D631 Anemia in chronic kidney disease: Secondary | ICD-10-CM | POA: Diagnosis not present

## 2021-09-03 DIAGNOSIS — D509 Iron deficiency anemia, unspecified: Secondary | ICD-10-CM | POA: Diagnosis not present

## 2021-09-03 DIAGNOSIS — Z992 Dependence on renal dialysis: Secondary | ICD-10-CM | POA: Diagnosis not present

## 2021-09-03 DIAGNOSIS — N2581 Secondary hyperparathyroidism of renal origin: Secondary | ICD-10-CM | POA: Diagnosis not present

## 2021-09-03 DIAGNOSIS — N186 End stage renal disease: Secondary | ICD-10-CM | POA: Diagnosis not present

## 2021-09-03 DIAGNOSIS — Z4932 Encounter for adequacy testing for peritoneal dialysis: Secondary | ICD-10-CM | POA: Diagnosis not present

## 2021-09-04 DIAGNOSIS — N186 End stage renal disease: Secondary | ICD-10-CM | POA: Diagnosis not present

## 2021-09-04 DIAGNOSIS — Z992 Dependence on renal dialysis: Secondary | ICD-10-CM | POA: Diagnosis not present

## 2021-09-04 DIAGNOSIS — D509 Iron deficiency anemia, unspecified: Secondary | ICD-10-CM | POA: Diagnosis not present

## 2021-09-04 DIAGNOSIS — Z4932 Encounter for adequacy testing for peritoneal dialysis: Secondary | ICD-10-CM | POA: Diagnosis not present

## 2021-09-04 DIAGNOSIS — N2581 Secondary hyperparathyroidism of renal origin: Secondary | ICD-10-CM | POA: Diagnosis not present

## 2021-09-04 DIAGNOSIS — D631 Anemia in chronic kidney disease: Secondary | ICD-10-CM | POA: Diagnosis not present

## 2021-09-05 DIAGNOSIS — Z4932 Encounter for adequacy testing for peritoneal dialysis: Secondary | ICD-10-CM | POA: Diagnosis not present

## 2021-09-05 DIAGNOSIS — N186 End stage renal disease: Secondary | ICD-10-CM | POA: Diagnosis not present

## 2021-09-05 DIAGNOSIS — D631 Anemia in chronic kidney disease: Secondary | ICD-10-CM | POA: Diagnosis not present

## 2021-09-05 DIAGNOSIS — D509 Iron deficiency anemia, unspecified: Secondary | ICD-10-CM | POA: Diagnosis not present

## 2021-09-05 DIAGNOSIS — N2581 Secondary hyperparathyroidism of renal origin: Secondary | ICD-10-CM | POA: Diagnosis not present

## 2021-09-05 DIAGNOSIS — Z992 Dependence on renal dialysis: Secondary | ICD-10-CM | POA: Diagnosis not present

## 2021-09-06 DIAGNOSIS — D509 Iron deficiency anemia, unspecified: Secondary | ICD-10-CM | POA: Diagnosis not present

## 2021-09-06 DIAGNOSIS — Z4932 Encounter for adequacy testing for peritoneal dialysis: Secondary | ICD-10-CM | POA: Diagnosis not present

## 2021-09-06 DIAGNOSIS — N186 End stage renal disease: Secondary | ICD-10-CM | POA: Diagnosis not present

## 2021-09-06 DIAGNOSIS — N2581 Secondary hyperparathyroidism of renal origin: Secondary | ICD-10-CM | POA: Diagnosis not present

## 2021-09-06 DIAGNOSIS — D631 Anemia in chronic kidney disease: Secondary | ICD-10-CM | POA: Diagnosis not present

## 2021-09-06 DIAGNOSIS — Z992 Dependence on renal dialysis: Secondary | ICD-10-CM | POA: Diagnosis not present

## 2021-09-07 DIAGNOSIS — D631 Anemia in chronic kidney disease: Secondary | ICD-10-CM | POA: Diagnosis not present

## 2021-09-07 DIAGNOSIS — Z4932 Encounter for adequacy testing for peritoneal dialysis: Secondary | ICD-10-CM | POA: Diagnosis not present

## 2021-09-07 DIAGNOSIS — Z992 Dependence on renal dialysis: Secondary | ICD-10-CM | POA: Diagnosis not present

## 2021-09-07 DIAGNOSIS — N2581 Secondary hyperparathyroidism of renal origin: Secondary | ICD-10-CM | POA: Diagnosis not present

## 2021-09-07 DIAGNOSIS — D509 Iron deficiency anemia, unspecified: Secondary | ICD-10-CM | POA: Diagnosis not present

## 2021-09-07 DIAGNOSIS — N186 End stage renal disease: Secondary | ICD-10-CM | POA: Diagnosis not present

## 2021-09-08 DIAGNOSIS — D509 Iron deficiency anemia, unspecified: Secondary | ICD-10-CM | POA: Diagnosis not present

## 2021-09-08 DIAGNOSIS — N2581 Secondary hyperparathyroidism of renal origin: Secondary | ICD-10-CM | POA: Diagnosis not present

## 2021-09-08 DIAGNOSIS — Z992 Dependence on renal dialysis: Secondary | ICD-10-CM | POA: Diagnosis not present

## 2021-09-08 DIAGNOSIS — N186 End stage renal disease: Secondary | ICD-10-CM | POA: Diagnosis not present

## 2021-09-08 DIAGNOSIS — Z4932 Encounter for adequacy testing for peritoneal dialysis: Secondary | ICD-10-CM | POA: Diagnosis not present

## 2021-09-08 DIAGNOSIS — D631 Anemia in chronic kidney disease: Secondary | ICD-10-CM | POA: Diagnosis not present

## 2021-09-09 DIAGNOSIS — D631 Anemia in chronic kidney disease: Secondary | ICD-10-CM | POA: Diagnosis not present

## 2021-09-09 DIAGNOSIS — Z992 Dependence on renal dialysis: Secondary | ICD-10-CM | POA: Diagnosis not present

## 2021-09-09 DIAGNOSIS — Z4932 Encounter for adequacy testing for peritoneal dialysis: Secondary | ICD-10-CM | POA: Diagnosis not present

## 2021-09-09 DIAGNOSIS — N2581 Secondary hyperparathyroidism of renal origin: Secondary | ICD-10-CM | POA: Diagnosis not present

## 2021-09-09 DIAGNOSIS — N186 End stage renal disease: Secondary | ICD-10-CM | POA: Diagnosis not present

## 2021-09-09 DIAGNOSIS — D509 Iron deficiency anemia, unspecified: Secondary | ICD-10-CM | POA: Diagnosis not present

## 2021-09-10 DIAGNOSIS — Z4932 Encounter for adequacy testing for peritoneal dialysis: Secondary | ICD-10-CM | POA: Diagnosis not present

## 2021-09-10 DIAGNOSIS — N186 End stage renal disease: Secondary | ICD-10-CM | POA: Diagnosis not present

## 2021-09-10 DIAGNOSIS — Z992 Dependence on renal dialysis: Secondary | ICD-10-CM | POA: Diagnosis not present

## 2021-09-10 DIAGNOSIS — D631 Anemia in chronic kidney disease: Secondary | ICD-10-CM | POA: Diagnosis not present

## 2021-09-10 DIAGNOSIS — D509 Iron deficiency anemia, unspecified: Secondary | ICD-10-CM | POA: Diagnosis not present

## 2021-09-10 DIAGNOSIS — N2581 Secondary hyperparathyroidism of renal origin: Secondary | ICD-10-CM | POA: Diagnosis not present

## 2021-09-11 DIAGNOSIS — I129 Hypertensive chronic kidney disease with stage 1 through stage 4 chronic kidney disease, or unspecified chronic kidney disease: Secondary | ICD-10-CM | POA: Diagnosis not present

## 2021-09-11 DIAGNOSIS — Z992 Dependence on renal dialysis: Secondary | ICD-10-CM | POA: Diagnosis not present

## 2021-09-11 DIAGNOSIS — N2581 Secondary hyperparathyroidism of renal origin: Secondary | ICD-10-CM | POA: Diagnosis not present

## 2021-09-11 DIAGNOSIS — D509 Iron deficiency anemia, unspecified: Secondary | ICD-10-CM | POA: Diagnosis not present

## 2021-09-11 DIAGNOSIS — D631 Anemia in chronic kidney disease: Secondary | ICD-10-CM | POA: Diagnosis not present

## 2021-09-11 DIAGNOSIS — N186 End stage renal disease: Secondary | ICD-10-CM | POA: Diagnosis not present

## 2021-09-11 DIAGNOSIS — Z4932 Encounter for adequacy testing for peritoneal dialysis: Secondary | ICD-10-CM | POA: Diagnosis not present

## 2021-09-13 DIAGNOSIS — Z992 Dependence on renal dialysis: Secondary | ICD-10-CM | POA: Diagnosis not present

## 2021-09-13 DIAGNOSIS — D631 Anemia in chronic kidney disease: Secondary | ICD-10-CM | POA: Diagnosis not present

## 2021-09-13 DIAGNOSIS — Z4932 Encounter for adequacy testing for peritoneal dialysis: Secondary | ICD-10-CM | POA: Diagnosis not present

## 2021-09-13 DIAGNOSIS — Z23 Encounter for immunization: Secondary | ICD-10-CM | POA: Diagnosis not present

## 2021-09-13 DIAGNOSIS — N2581 Secondary hyperparathyroidism of renal origin: Secondary | ICD-10-CM | POA: Diagnosis not present

## 2021-09-13 DIAGNOSIS — N186 End stage renal disease: Secondary | ICD-10-CM | POA: Diagnosis not present

## 2021-09-14 DIAGNOSIS — N2581 Secondary hyperparathyroidism of renal origin: Secondary | ICD-10-CM | POA: Diagnosis not present

## 2021-09-14 DIAGNOSIS — Z4932 Encounter for adequacy testing for peritoneal dialysis: Secondary | ICD-10-CM | POA: Diagnosis not present

## 2021-09-14 DIAGNOSIS — Z23 Encounter for immunization: Secondary | ICD-10-CM | POA: Diagnosis not present

## 2021-09-14 DIAGNOSIS — N186 End stage renal disease: Secondary | ICD-10-CM | POA: Diagnosis not present

## 2021-09-14 DIAGNOSIS — Z992 Dependence on renal dialysis: Secondary | ICD-10-CM | POA: Diagnosis not present

## 2021-09-14 DIAGNOSIS — D631 Anemia in chronic kidney disease: Secondary | ICD-10-CM | POA: Diagnosis not present

## 2021-09-15 DIAGNOSIS — Z4932 Encounter for adequacy testing for peritoneal dialysis: Secondary | ICD-10-CM | POA: Diagnosis not present

## 2021-09-15 DIAGNOSIS — Z992 Dependence on renal dialysis: Secondary | ICD-10-CM | POA: Diagnosis not present

## 2021-09-15 DIAGNOSIS — D631 Anemia in chronic kidney disease: Secondary | ICD-10-CM | POA: Diagnosis not present

## 2021-09-15 DIAGNOSIS — N2581 Secondary hyperparathyroidism of renal origin: Secondary | ICD-10-CM | POA: Diagnosis not present

## 2021-09-15 DIAGNOSIS — Z23 Encounter for immunization: Secondary | ICD-10-CM | POA: Diagnosis not present

## 2021-09-15 DIAGNOSIS — N186 End stage renal disease: Secondary | ICD-10-CM | POA: Diagnosis not present

## 2021-09-16 DIAGNOSIS — Z23 Encounter for immunization: Secondary | ICD-10-CM | POA: Diagnosis not present

## 2021-09-16 DIAGNOSIS — Z4932 Encounter for adequacy testing for peritoneal dialysis: Secondary | ICD-10-CM | POA: Diagnosis not present

## 2021-09-16 DIAGNOSIS — D631 Anemia in chronic kidney disease: Secondary | ICD-10-CM | POA: Diagnosis not present

## 2021-09-16 DIAGNOSIS — N186 End stage renal disease: Secondary | ICD-10-CM | POA: Diagnosis not present

## 2021-09-16 DIAGNOSIS — N2581 Secondary hyperparathyroidism of renal origin: Secondary | ICD-10-CM | POA: Diagnosis not present

## 2021-09-16 DIAGNOSIS — Z992 Dependence on renal dialysis: Secondary | ICD-10-CM | POA: Diagnosis not present

## 2021-09-17 DIAGNOSIS — N2581 Secondary hyperparathyroidism of renal origin: Secondary | ICD-10-CM | POA: Diagnosis not present

## 2021-09-17 DIAGNOSIS — Z23 Encounter for immunization: Secondary | ICD-10-CM | POA: Diagnosis not present

## 2021-09-17 DIAGNOSIS — Z992 Dependence on renal dialysis: Secondary | ICD-10-CM | POA: Diagnosis not present

## 2021-09-17 DIAGNOSIS — D631 Anemia in chronic kidney disease: Secondary | ICD-10-CM | POA: Diagnosis not present

## 2021-09-17 DIAGNOSIS — N186 End stage renal disease: Secondary | ICD-10-CM | POA: Diagnosis not present

## 2021-09-17 DIAGNOSIS — Z4932 Encounter for adequacy testing for peritoneal dialysis: Secondary | ICD-10-CM | POA: Diagnosis not present

## 2021-09-18 DIAGNOSIS — Z23 Encounter for immunization: Secondary | ICD-10-CM | POA: Diagnosis not present

## 2021-09-18 DIAGNOSIS — D631 Anemia in chronic kidney disease: Secondary | ICD-10-CM | POA: Diagnosis not present

## 2021-09-18 DIAGNOSIS — Z992 Dependence on renal dialysis: Secondary | ICD-10-CM | POA: Diagnosis not present

## 2021-09-18 DIAGNOSIS — Z4932 Encounter for adequacy testing for peritoneal dialysis: Secondary | ICD-10-CM | POA: Diagnosis not present

## 2021-09-18 DIAGNOSIS — N186 End stage renal disease: Secondary | ICD-10-CM | POA: Diagnosis not present

## 2021-09-18 DIAGNOSIS — N2581 Secondary hyperparathyroidism of renal origin: Secondary | ICD-10-CM | POA: Diagnosis not present

## 2021-09-19 DIAGNOSIS — N186 End stage renal disease: Secondary | ICD-10-CM | POA: Diagnosis not present

## 2021-09-19 DIAGNOSIS — Z23 Encounter for immunization: Secondary | ICD-10-CM | POA: Diagnosis not present

## 2021-09-19 DIAGNOSIS — N2581 Secondary hyperparathyroidism of renal origin: Secondary | ICD-10-CM | POA: Diagnosis not present

## 2021-09-19 DIAGNOSIS — D631 Anemia in chronic kidney disease: Secondary | ICD-10-CM | POA: Diagnosis not present

## 2021-09-19 DIAGNOSIS — Z4932 Encounter for adequacy testing for peritoneal dialysis: Secondary | ICD-10-CM | POA: Diagnosis not present

## 2021-09-19 DIAGNOSIS — Z992 Dependence on renal dialysis: Secondary | ICD-10-CM | POA: Diagnosis not present

## 2021-09-20 DIAGNOSIS — Z4932 Encounter for adequacy testing for peritoneal dialysis: Secondary | ICD-10-CM | POA: Diagnosis not present

## 2021-09-20 DIAGNOSIS — Z23 Encounter for immunization: Secondary | ICD-10-CM | POA: Diagnosis not present

## 2021-09-20 DIAGNOSIS — N186 End stage renal disease: Secondary | ICD-10-CM | POA: Diagnosis not present

## 2021-09-20 DIAGNOSIS — Z992 Dependence on renal dialysis: Secondary | ICD-10-CM | POA: Diagnosis not present

## 2021-09-20 DIAGNOSIS — N2581 Secondary hyperparathyroidism of renal origin: Secondary | ICD-10-CM | POA: Diagnosis not present

## 2021-09-20 DIAGNOSIS — D631 Anemia in chronic kidney disease: Secondary | ICD-10-CM | POA: Diagnosis not present

## 2021-09-21 DIAGNOSIS — Z992 Dependence on renal dialysis: Secondary | ICD-10-CM | POA: Diagnosis not present

## 2021-09-21 DIAGNOSIS — Z4932 Encounter for adequacy testing for peritoneal dialysis: Secondary | ICD-10-CM | POA: Diagnosis not present

## 2021-09-21 DIAGNOSIS — N2581 Secondary hyperparathyroidism of renal origin: Secondary | ICD-10-CM | POA: Diagnosis not present

## 2021-09-21 DIAGNOSIS — D631 Anemia in chronic kidney disease: Secondary | ICD-10-CM | POA: Diagnosis not present

## 2021-09-21 DIAGNOSIS — Z23 Encounter for immunization: Secondary | ICD-10-CM | POA: Diagnosis not present

## 2021-09-21 DIAGNOSIS — N186 End stage renal disease: Secondary | ICD-10-CM | POA: Diagnosis not present

## 2021-09-22 DIAGNOSIS — N186 End stage renal disease: Secondary | ICD-10-CM | POA: Diagnosis not present

## 2021-09-22 DIAGNOSIS — D631 Anemia in chronic kidney disease: Secondary | ICD-10-CM | POA: Diagnosis not present

## 2021-09-22 DIAGNOSIS — Z992 Dependence on renal dialysis: Secondary | ICD-10-CM | POA: Diagnosis not present

## 2021-09-22 DIAGNOSIS — Z23 Encounter for immunization: Secondary | ICD-10-CM | POA: Diagnosis not present

## 2021-09-22 DIAGNOSIS — N2581 Secondary hyperparathyroidism of renal origin: Secondary | ICD-10-CM | POA: Diagnosis not present

## 2021-09-22 DIAGNOSIS — Z4932 Encounter for adequacy testing for peritoneal dialysis: Secondary | ICD-10-CM | POA: Diagnosis not present

## 2021-09-23 DIAGNOSIS — Z992 Dependence on renal dialysis: Secondary | ICD-10-CM | POA: Diagnosis not present

## 2021-09-23 DIAGNOSIS — D631 Anemia in chronic kidney disease: Secondary | ICD-10-CM | POA: Diagnosis not present

## 2021-09-23 DIAGNOSIS — Z4932 Encounter for adequacy testing for peritoneal dialysis: Secondary | ICD-10-CM | POA: Diagnosis not present

## 2021-09-23 DIAGNOSIS — N2581 Secondary hyperparathyroidism of renal origin: Secondary | ICD-10-CM | POA: Diagnosis not present

## 2021-09-23 DIAGNOSIS — N186 End stage renal disease: Secondary | ICD-10-CM | POA: Diagnosis not present

## 2021-09-23 DIAGNOSIS — Z23 Encounter for immunization: Secondary | ICD-10-CM | POA: Diagnosis not present

## 2021-09-24 DIAGNOSIS — D631 Anemia in chronic kidney disease: Secondary | ICD-10-CM | POA: Diagnosis not present

## 2021-09-24 DIAGNOSIS — Z23 Encounter for immunization: Secondary | ICD-10-CM | POA: Diagnosis not present

## 2021-09-24 DIAGNOSIS — Z992 Dependence on renal dialysis: Secondary | ICD-10-CM | POA: Diagnosis not present

## 2021-09-24 DIAGNOSIS — Z4932 Encounter for adequacy testing for peritoneal dialysis: Secondary | ICD-10-CM | POA: Diagnosis not present

## 2021-09-24 DIAGNOSIS — N186 End stage renal disease: Secondary | ICD-10-CM | POA: Diagnosis not present

## 2021-09-24 DIAGNOSIS — N2581 Secondary hyperparathyroidism of renal origin: Secondary | ICD-10-CM | POA: Diagnosis not present

## 2021-09-25 DIAGNOSIS — D631 Anemia in chronic kidney disease: Secondary | ICD-10-CM | POA: Diagnosis not present

## 2021-09-25 DIAGNOSIS — Z992 Dependence on renal dialysis: Secondary | ICD-10-CM | POA: Diagnosis not present

## 2021-09-25 DIAGNOSIS — Z23 Encounter for immunization: Secondary | ICD-10-CM | POA: Diagnosis not present

## 2021-09-25 DIAGNOSIS — N186 End stage renal disease: Secondary | ICD-10-CM | POA: Diagnosis not present

## 2021-09-25 DIAGNOSIS — N2581 Secondary hyperparathyroidism of renal origin: Secondary | ICD-10-CM | POA: Diagnosis not present

## 2021-09-25 DIAGNOSIS — Z4932 Encounter for adequacy testing for peritoneal dialysis: Secondary | ICD-10-CM | POA: Diagnosis not present

## 2021-09-26 DIAGNOSIS — Z23 Encounter for immunization: Secondary | ICD-10-CM | POA: Diagnosis not present

## 2021-09-26 DIAGNOSIS — N2581 Secondary hyperparathyroidism of renal origin: Secondary | ICD-10-CM | POA: Diagnosis not present

## 2021-09-26 DIAGNOSIS — D631 Anemia in chronic kidney disease: Secondary | ICD-10-CM | POA: Diagnosis not present

## 2021-09-26 DIAGNOSIS — Z4932 Encounter for adequacy testing for peritoneal dialysis: Secondary | ICD-10-CM | POA: Diagnosis not present

## 2021-09-26 DIAGNOSIS — Z992 Dependence on renal dialysis: Secondary | ICD-10-CM | POA: Diagnosis not present

## 2021-09-26 DIAGNOSIS — N186 End stage renal disease: Secondary | ICD-10-CM | POA: Diagnosis not present

## 2021-09-27 DIAGNOSIS — N186 End stage renal disease: Secondary | ICD-10-CM | POA: Diagnosis not present

## 2021-09-27 DIAGNOSIS — Z23 Encounter for immunization: Secondary | ICD-10-CM | POA: Diagnosis not present

## 2021-09-27 DIAGNOSIS — D631 Anemia in chronic kidney disease: Secondary | ICD-10-CM | POA: Diagnosis not present

## 2021-09-27 DIAGNOSIS — N2581 Secondary hyperparathyroidism of renal origin: Secondary | ICD-10-CM | POA: Diagnosis not present

## 2021-09-27 DIAGNOSIS — Z992 Dependence on renal dialysis: Secondary | ICD-10-CM | POA: Diagnosis not present

## 2021-09-27 DIAGNOSIS — Z4932 Encounter for adequacy testing for peritoneal dialysis: Secondary | ICD-10-CM | POA: Diagnosis not present

## 2021-09-28 DIAGNOSIS — D631 Anemia in chronic kidney disease: Secondary | ICD-10-CM | POA: Diagnosis not present

## 2021-09-28 DIAGNOSIS — Z4932 Encounter for adequacy testing for peritoneal dialysis: Secondary | ICD-10-CM | POA: Diagnosis not present

## 2021-09-28 DIAGNOSIS — N2581 Secondary hyperparathyroidism of renal origin: Secondary | ICD-10-CM | POA: Diagnosis not present

## 2021-09-28 DIAGNOSIS — N186 End stage renal disease: Secondary | ICD-10-CM | POA: Diagnosis not present

## 2021-09-28 DIAGNOSIS — Z23 Encounter for immunization: Secondary | ICD-10-CM | POA: Diagnosis not present

## 2021-09-28 DIAGNOSIS — Z992 Dependence on renal dialysis: Secondary | ICD-10-CM | POA: Diagnosis not present

## 2021-09-29 DIAGNOSIS — D631 Anemia in chronic kidney disease: Secondary | ICD-10-CM | POA: Diagnosis not present

## 2021-09-29 DIAGNOSIS — Z4932 Encounter for adequacy testing for peritoneal dialysis: Secondary | ICD-10-CM | POA: Diagnosis not present

## 2021-09-29 DIAGNOSIS — N186 End stage renal disease: Secondary | ICD-10-CM | POA: Diagnosis not present

## 2021-09-29 DIAGNOSIS — Z23 Encounter for immunization: Secondary | ICD-10-CM | POA: Diagnosis not present

## 2021-09-29 DIAGNOSIS — Z992 Dependence on renal dialysis: Secondary | ICD-10-CM | POA: Diagnosis not present

## 2021-09-29 DIAGNOSIS — N2581 Secondary hyperparathyroidism of renal origin: Secondary | ICD-10-CM | POA: Diagnosis not present

## 2021-09-30 DIAGNOSIS — Z4932 Encounter for adequacy testing for peritoneal dialysis: Secondary | ICD-10-CM | POA: Diagnosis not present

## 2021-09-30 DIAGNOSIS — Z992 Dependence on renal dialysis: Secondary | ICD-10-CM | POA: Diagnosis not present

## 2021-09-30 DIAGNOSIS — D631 Anemia in chronic kidney disease: Secondary | ICD-10-CM | POA: Diagnosis not present

## 2021-09-30 DIAGNOSIS — N2581 Secondary hyperparathyroidism of renal origin: Secondary | ICD-10-CM | POA: Diagnosis not present

## 2021-09-30 DIAGNOSIS — Z23 Encounter for immunization: Secondary | ICD-10-CM | POA: Diagnosis not present

## 2021-09-30 DIAGNOSIS — N186 End stage renal disease: Secondary | ICD-10-CM | POA: Diagnosis not present

## 2021-10-01 DIAGNOSIS — N2581 Secondary hyperparathyroidism of renal origin: Secondary | ICD-10-CM | POA: Diagnosis not present

## 2021-10-01 DIAGNOSIS — Z23 Encounter for immunization: Secondary | ICD-10-CM | POA: Diagnosis not present

## 2021-10-01 DIAGNOSIS — Z4932 Encounter for adequacy testing for peritoneal dialysis: Secondary | ICD-10-CM | POA: Diagnosis not present

## 2021-10-01 DIAGNOSIS — Z992 Dependence on renal dialysis: Secondary | ICD-10-CM | POA: Diagnosis not present

## 2021-10-01 DIAGNOSIS — D631 Anemia in chronic kidney disease: Secondary | ICD-10-CM | POA: Diagnosis not present

## 2021-10-01 DIAGNOSIS — N186 End stage renal disease: Secondary | ICD-10-CM | POA: Diagnosis not present

## 2021-10-02 DIAGNOSIS — N186 End stage renal disease: Secondary | ICD-10-CM | POA: Diagnosis not present

## 2021-10-02 DIAGNOSIS — Z4932 Encounter for adequacy testing for peritoneal dialysis: Secondary | ICD-10-CM | POA: Diagnosis not present

## 2021-10-02 DIAGNOSIS — Z992 Dependence on renal dialysis: Secondary | ICD-10-CM | POA: Diagnosis not present

## 2021-10-02 DIAGNOSIS — D631 Anemia in chronic kidney disease: Secondary | ICD-10-CM | POA: Diagnosis not present

## 2021-10-02 DIAGNOSIS — Z23 Encounter for immunization: Secondary | ICD-10-CM | POA: Diagnosis not present

## 2021-10-02 DIAGNOSIS — N2581 Secondary hyperparathyroidism of renal origin: Secondary | ICD-10-CM | POA: Diagnosis not present

## 2021-10-03 DIAGNOSIS — D631 Anemia in chronic kidney disease: Secondary | ICD-10-CM | POA: Diagnosis not present

## 2021-10-03 DIAGNOSIS — Z23 Encounter for immunization: Secondary | ICD-10-CM | POA: Diagnosis not present

## 2021-10-03 DIAGNOSIS — N2581 Secondary hyperparathyroidism of renal origin: Secondary | ICD-10-CM | POA: Diagnosis not present

## 2021-10-03 DIAGNOSIS — Z4932 Encounter for adequacy testing for peritoneal dialysis: Secondary | ICD-10-CM | POA: Diagnosis not present

## 2021-10-03 DIAGNOSIS — N186 End stage renal disease: Secondary | ICD-10-CM | POA: Diagnosis not present

## 2021-10-03 DIAGNOSIS — Z992 Dependence on renal dialysis: Secondary | ICD-10-CM | POA: Diagnosis not present

## 2021-10-04 DIAGNOSIS — N186 End stage renal disease: Secondary | ICD-10-CM | POA: Diagnosis not present

## 2021-10-04 DIAGNOSIS — Z4932 Encounter for adequacy testing for peritoneal dialysis: Secondary | ICD-10-CM | POA: Diagnosis not present

## 2021-10-04 DIAGNOSIS — D631 Anemia in chronic kidney disease: Secondary | ICD-10-CM | POA: Diagnosis not present

## 2021-10-04 DIAGNOSIS — Z23 Encounter for immunization: Secondary | ICD-10-CM | POA: Diagnosis not present

## 2021-10-04 DIAGNOSIS — Z992 Dependence on renal dialysis: Secondary | ICD-10-CM | POA: Diagnosis not present

## 2021-10-04 DIAGNOSIS — N2581 Secondary hyperparathyroidism of renal origin: Secondary | ICD-10-CM | POA: Diagnosis not present

## 2021-10-05 DIAGNOSIS — Z992 Dependence on renal dialysis: Secondary | ICD-10-CM | POA: Diagnosis not present

## 2021-10-05 DIAGNOSIS — D631 Anemia in chronic kidney disease: Secondary | ICD-10-CM | POA: Diagnosis not present

## 2021-10-05 DIAGNOSIS — N186 End stage renal disease: Secondary | ICD-10-CM | POA: Diagnosis not present

## 2021-10-05 DIAGNOSIS — Z23 Encounter for immunization: Secondary | ICD-10-CM | POA: Diagnosis not present

## 2021-10-05 DIAGNOSIS — Z4932 Encounter for adequacy testing for peritoneal dialysis: Secondary | ICD-10-CM | POA: Diagnosis not present

## 2021-10-05 DIAGNOSIS — N2581 Secondary hyperparathyroidism of renal origin: Secondary | ICD-10-CM | POA: Diagnosis not present

## 2021-10-06 DIAGNOSIS — Z23 Encounter for immunization: Secondary | ICD-10-CM | POA: Diagnosis not present

## 2021-10-06 DIAGNOSIS — N2581 Secondary hyperparathyroidism of renal origin: Secondary | ICD-10-CM | POA: Diagnosis not present

## 2021-10-06 DIAGNOSIS — Z992 Dependence on renal dialysis: Secondary | ICD-10-CM | POA: Diagnosis not present

## 2021-10-06 DIAGNOSIS — Z4932 Encounter for adequacy testing for peritoneal dialysis: Secondary | ICD-10-CM | POA: Diagnosis not present

## 2021-10-06 DIAGNOSIS — D631 Anemia in chronic kidney disease: Secondary | ICD-10-CM | POA: Diagnosis not present

## 2021-10-06 DIAGNOSIS — N186 End stage renal disease: Secondary | ICD-10-CM | POA: Diagnosis not present

## 2021-10-07 DIAGNOSIS — N186 End stage renal disease: Secondary | ICD-10-CM | POA: Diagnosis not present

## 2021-10-07 DIAGNOSIS — Z992 Dependence on renal dialysis: Secondary | ICD-10-CM | POA: Diagnosis not present

## 2021-10-07 DIAGNOSIS — N2581 Secondary hyperparathyroidism of renal origin: Secondary | ICD-10-CM | POA: Diagnosis not present

## 2021-10-07 DIAGNOSIS — Z4932 Encounter for adequacy testing for peritoneal dialysis: Secondary | ICD-10-CM | POA: Diagnosis not present

## 2021-10-07 DIAGNOSIS — Z23 Encounter for immunization: Secondary | ICD-10-CM | POA: Diagnosis not present

## 2021-10-07 DIAGNOSIS — D631 Anemia in chronic kidney disease: Secondary | ICD-10-CM | POA: Diagnosis not present

## 2021-10-08 DIAGNOSIS — D631 Anemia in chronic kidney disease: Secondary | ICD-10-CM | POA: Diagnosis not present

## 2021-10-08 DIAGNOSIS — Z992 Dependence on renal dialysis: Secondary | ICD-10-CM | POA: Diagnosis not present

## 2021-10-08 DIAGNOSIS — Z4932 Encounter for adequacy testing for peritoneal dialysis: Secondary | ICD-10-CM | POA: Diagnosis not present

## 2021-10-08 DIAGNOSIS — N186 End stage renal disease: Secondary | ICD-10-CM | POA: Diagnosis not present

## 2021-10-08 DIAGNOSIS — N2581 Secondary hyperparathyroidism of renal origin: Secondary | ICD-10-CM | POA: Diagnosis not present

## 2021-10-08 DIAGNOSIS — Z23 Encounter for immunization: Secondary | ICD-10-CM | POA: Diagnosis not present

## 2021-10-09 DIAGNOSIS — Z992 Dependence on renal dialysis: Secondary | ICD-10-CM | POA: Diagnosis not present

## 2021-10-09 DIAGNOSIS — D631 Anemia in chronic kidney disease: Secondary | ICD-10-CM | POA: Diagnosis not present

## 2021-10-09 DIAGNOSIS — N186 End stage renal disease: Secondary | ICD-10-CM | POA: Diagnosis not present

## 2021-10-09 DIAGNOSIS — Z23 Encounter for immunization: Secondary | ICD-10-CM | POA: Diagnosis not present

## 2021-10-09 DIAGNOSIS — Z4932 Encounter for adequacy testing for peritoneal dialysis: Secondary | ICD-10-CM | POA: Diagnosis not present

## 2021-10-09 DIAGNOSIS — N2581 Secondary hyperparathyroidism of renal origin: Secondary | ICD-10-CM | POA: Diagnosis not present

## 2021-10-10 DIAGNOSIS — D631 Anemia in chronic kidney disease: Secondary | ICD-10-CM | POA: Diagnosis not present

## 2021-10-10 DIAGNOSIS — Z4932 Encounter for adequacy testing for peritoneal dialysis: Secondary | ICD-10-CM | POA: Diagnosis not present

## 2021-10-10 DIAGNOSIS — Z23 Encounter for immunization: Secondary | ICD-10-CM | POA: Diagnosis not present

## 2021-10-10 DIAGNOSIS — N186 End stage renal disease: Secondary | ICD-10-CM | POA: Diagnosis not present

## 2021-10-10 DIAGNOSIS — Z992 Dependence on renal dialysis: Secondary | ICD-10-CM | POA: Diagnosis not present

## 2021-10-10 DIAGNOSIS — N2581 Secondary hyperparathyroidism of renal origin: Secondary | ICD-10-CM | POA: Diagnosis not present

## 2021-10-11 DIAGNOSIS — Z23 Encounter for immunization: Secondary | ICD-10-CM | POA: Diagnosis not present

## 2021-10-11 DIAGNOSIS — I129 Hypertensive chronic kidney disease with stage 1 through stage 4 chronic kidney disease, or unspecified chronic kidney disease: Secondary | ICD-10-CM | POA: Diagnosis not present

## 2021-10-11 DIAGNOSIS — Z4932 Encounter for adequacy testing for peritoneal dialysis: Secondary | ICD-10-CM | POA: Diagnosis not present

## 2021-10-11 DIAGNOSIS — D631 Anemia in chronic kidney disease: Secondary | ICD-10-CM | POA: Diagnosis not present

## 2021-10-11 DIAGNOSIS — Z992 Dependence on renal dialysis: Secondary | ICD-10-CM | POA: Diagnosis not present

## 2021-10-11 DIAGNOSIS — N186 End stage renal disease: Secondary | ICD-10-CM | POA: Diagnosis not present

## 2021-10-11 DIAGNOSIS — N2581 Secondary hyperparathyroidism of renal origin: Secondary | ICD-10-CM | POA: Diagnosis not present

## 2021-10-12 DIAGNOSIS — Z992 Dependence on renal dialysis: Secondary | ICD-10-CM | POA: Diagnosis not present

## 2021-10-12 DIAGNOSIS — N186 End stage renal disease: Secondary | ICD-10-CM | POA: Diagnosis not present

## 2021-10-12 DIAGNOSIS — N2581 Secondary hyperparathyroidism of renal origin: Secondary | ICD-10-CM | POA: Diagnosis not present

## 2021-10-12 DIAGNOSIS — Z4932 Encounter for adequacy testing for peritoneal dialysis: Secondary | ICD-10-CM | POA: Diagnosis not present

## 2021-10-12 DIAGNOSIS — D631 Anemia in chronic kidney disease: Secondary | ICD-10-CM | POA: Diagnosis not present

## 2021-10-12 DIAGNOSIS — D509 Iron deficiency anemia, unspecified: Secondary | ICD-10-CM | POA: Diagnosis not present

## 2021-10-13 DIAGNOSIS — N2581 Secondary hyperparathyroidism of renal origin: Secondary | ICD-10-CM | POA: Diagnosis not present

## 2021-10-13 DIAGNOSIS — N186 End stage renal disease: Secondary | ICD-10-CM | POA: Diagnosis not present

## 2021-10-13 DIAGNOSIS — Z4932 Encounter for adequacy testing for peritoneal dialysis: Secondary | ICD-10-CM | POA: Diagnosis not present

## 2021-10-13 DIAGNOSIS — D631 Anemia in chronic kidney disease: Secondary | ICD-10-CM | POA: Diagnosis not present

## 2021-10-13 DIAGNOSIS — D509 Iron deficiency anemia, unspecified: Secondary | ICD-10-CM | POA: Diagnosis not present

## 2021-10-13 DIAGNOSIS — Z992 Dependence on renal dialysis: Secondary | ICD-10-CM | POA: Diagnosis not present

## 2021-10-14 DIAGNOSIS — Z4932 Encounter for adequacy testing for peritoneal dialysis: Secondary | ICD-10-CM | POA: Diagnosis not present

## 2021-10-14 DIAGNOSIS — D631 Anemia in chronic kidney disease: Secondary | ICD-10-CM | POA: Diagnosis not present

## 2021-10-14 DIAGNOSIS — D509 Iron deficiency anemia, unspecified: Secondary | ICD-10-CM | POA: Diagnosis not present

## 2021-10-14 DIAGNOSIS — Z992 Dependence on renal dialysis: Secondary | ICD-10-CM | POA: Diagnosis not present

## 2021-10-14 DIAGNOSIS — N2581 Secondary hyperparathyroidism of renal origin: Secondary | ICD-10-CM | POA: Diagnosis not present

## 2021-10-14 DIAGNOSIS — N186 End stage renal disease: Secondary | ICD-10-CM | POA: Diagnosis not present

## 2021-10-15 DIAGNOSIS — D631 Anemia in chronic kidney disease: Secondary | ICD-10-CM | POA: Diagnosis not present

## 2021-10-15 DIAGNOSIS — Z4932 Encounter for adequacy testing for peritoneal dialysis: Secondary | ICD-10-CM | POA: Diagnosis not present

## 2021-10-15 DIAGNOSIS — Z992 Dependence on renal dialysis: Secondary | ICD-10-CM | POA: Diagnosis not present

## 2021-10-15 DIAGNOSIS — N186 End stage renal disease: Secondary | ICD-10-CM | POA: Diagnosis not present

## 2021-10-15 DIAGNOSIS — D509 Iron deficiency anemia, unspecified: Secondary | ICD-10-CM | POA: Diagnosis not present

## 2021-10-15 DIAGNOSIS — N2581 Secondary hyperparathyroidism of renal origin: Secondary | ICD-10-CM | POA: Diagnosis not present

## 2021-10-16 DIAGNOSIS — D509 Iron deficiency anemia, unspecified: Secondary | ICD-10-CM | POA: Diagnosis not present

## 2021-10-16 DIAGNOSIS — D631 Anemia in chronic kidney disease: Secondary | ICD-10-CM | POA: Diagnosis not present

## 2021-10-16 DIAGNOSIS — Z992 Dependence on renal dialysis: Secondary | ICD-10-CM | POA: Diagnosis not present

## 2021-10-16 DIAGNOSIS — Z4932 Encounter for adequacy testing for peritoneal dialysis: Secondary | ICD-10-CM | POA: Diagnosis not present

## 2021-10-16 DIAGNOSIS — L0889 Other specified local infections of the skin and subcutaneous tissue: Secondary | ICD-10-CM | POA: Diagnosis not present

## 2021-10-16 DIAGNOSIS — N2581 Secondary hyperparathyroidism of renal origin: Secondary | ICD-10-CM | POA: Diagnosis not present

## 2021-10-16 DIAGNOSIS — N186 End stage renal disease: Secondary | ICD-10-CM | POA: Diagnosis not present

## 2021-10-17 DIAGNOSIS — N186 End stage renal disease: Secondary | ICD-10-CM | POA: Diagnosis not present

## 2021-10-17 DIAGNOSIS — D509 Iron deficiency anemia, unspecified: Secondary | ICD-10-CM | POA: Diagnosis not present

## 2021-10-17 DIAGNOSIS — D631 Anemia in chronic kidney disease: Secondary | ICD-10-CM | POA: Diagnosis not present

## 2021-10-17 DIAGNOSIS — Z992 Dependence on renal dialysis: Secondary | ICD-10-CM | POA: Diagnosis not present

## 2021-10-17 DIAGNOSIS — Z4932 Encounter for adequacy testing for peritoneal dialysis: Secondary | ICD-10-CM | POA: Diagnosis not present

## 2021-10-17 DIAGNOSIS — N2581 Secondary hyperparathyroidism of renal origin: Secondary | ICD-10-CM | POA: Diagnosis not present

## 2021-10-18 DIAGNOSIS — Z992 Dependence on renal dialysis: Secondary | ICD-10-CM | POA: Diagnosis not present

## 2021-10-18 DIAGNOSIS — N186 End stage renal disease: Secondary | ICD-10-CM | POA: Diagnosis not present

## 2021-10-18 DIAGNOSIS — Z4932 Encounter for adequacy testing for peritoneal dialysis: Secondary | ICD-10-CM | POA: Diagnosis not present

## 2021-10-18 DIAGNOSIS — N2581 Secondary hyperparathyroidism of renal origin: Secondary | ICD-10-CM | POA: Diagnosis not present

## 2021-10-18 DIAGNOSIS — D509 Iron deficiency anemia, unspecified: Secondary | ICD-10-CM | POA: Diagnosis not present

## 2021-10-18 DIAGNOSIS — D631 Anemia in chronic kidney disease: Secondary | ICD-10-CM | POA: Diagnosis not present

## 2021-10-19 DIAGNOSIS — D509 Iron deficiency anemia, unspecified: Secondary | ICD-10-CM | POA: Diagnosis not present

## 2021-10-19 DIAGNOSIS — N2581 Secondary hyperparathyroidism of renal origin: Secondary | ICD-10-CM | POA: Diagnosis not present

## 2021-10-19 DIAGNOSIS — Z4932 Encounter for adequacy testing for peritoneal dialysis: Secondary | ICD-10-CM | POA: Diagnosis not present

## 2021-10-19 DIAGNOSIS — N186 End stage renal disease: Secondary | ICD-10-CM | POA: Diagnosis not present

## 2021-10-19 DIAGNOSIS — D631 Anemia in chronic kidney disease: Secondary | ICD-10-CM | POA: Diagnosis not present

## 2021-10-19 DIAGNOSIS — Z992 Dependence on renal dialysis: Secondary | ICD-10-CM | POA: Diagnosis not present

## 2021-10-20 DIAGNOSIS — D631 Anemia in chronic kidney disease: Secondary | ICD-10-CM | POA: Diagnosis not present

## 2021-10-20 DIAGNOSIS — Z992 Dependence on renal dialysis: Secondary | ICD-10-CM | POA: Diagnosis not present

## 2021-10-20 DIAGNOSIS — D509 Iron deficiency anemia, unspecified: Secondary | ICD-10-CM | POA: Diagnosis not present

## 2021-10-20 DIAGNOSIS — Z4932 Encounter for adequacy testing for peritoneal dialysis: Secondary | ICD-10-CM | POA: Diagnosis not present

## 2021-10-20 DIAGNOSIS — N186 End stage renal disease: Secondary | ICD-10-CM | POA: Diagnosis not present

## 2021-10-20 DIAGNOSIS — N2581 Secondary hyperparathyroidism of renal origin: Secondary | ICD-10-CM | POA: Diagnosis not present

## 2021-10-21 DIAGNOSIS — D509 Iron deficiency anemia, unspecified: Secondary | ICD-10-CM | POA: Diagnosis not present

## 2021-10-21 DIAGNOSIS — D631 Anemia in chronic kidney disease: Secondary | ICD-10-CM | POA: Diagnosis not present

## 2021-10-21 DIAGNOSIS — N186 End stage renal disease: Secondary | ICD-10-CM | POA: Diagnosis not present

## 2021-10-21 DIAGNOSIS — N2581 Secondary hyperparathyroidism of renal origin: Secondary | ICD-10-CM | POA: Diagnosis not present

## 2021-10-21 DIAGNOSIS — Z4932 Encounter for adequacy testing for peritoneal dialysis: Secondary | ICD-10-CM | POA: Diagnosis not present

## 2021-10-21 DIAGNOSIS — Z992 Dependence on renal dialysis: Secondary | ICD-10-CM | POA: Diagnosis not present

## 2021-10-22 DIAGNOSIS — D509 Iron deficiency anemia, unspecified: Secondary | ICD-10-CM | POA: Diagnosis not present

## 2021-10-22 DIAGNOSIS — Z4932 Encounter for adequacy testing for peritoneal dialysis: Secondary | ICD-10-CM | POA: Diagnosis not present

## 2021-10-22 DIAGNOSIS — D631 Anemia in chronic kidney disease: Secondary | ICD-10-CM | POA: Diagnosis not present

## 2021-10-22 DIAGNOSIS — N186 End stage renal disease: Secondary | ICD-10-CM | POA: Diagnosis not present

## 2021-10-22 DIAGNOSIS — N2581 Secondary hyperparathyroidism of renal origin: Secondary | ICD-10-CM | POA: Diagnosis not present

## 2021-10-22 DIAGNOSIS — Z992 Dependence on renal dialysis: Secondary | ICD-10-CM | POA: Diagnosis not present

## 2021-10-23 DIAGNOSIS — D509 Iron deficiency anemia, unspecified: Secondary | ICD-10-CM | POA: Diagnosis not present

## 2021-10-23 DIAGNOSIS — Z992 Dependence on renal dialysis: Secondary | ICD-10-CM | POA: Diagnosis not present

## 2021-10-23 DIAGNOSIS — N186 End stage renal disease: Secondary | ICD-10-CM | POA: Diagnosis not present

## 2021-10-23 DIAGNOSIS — D631 Anemia in chronic kidney disease: Secondary | ICD-10-CM | POA: Diagnosis not present

## 2021-10-23 DIAGNOSIS — Z4932 Encounter for adequacy testing for peritoneal dialysis: Secondary | ICD-10-CM | POA: Diagnosis not present

## 2021-10-23 DIAGNOSIS — N2581 Secondary hyperparathyroidism of renal origin: Secondary | ICD-10-CM | POA: Diagnosis not present

## 2021-10-24 DIAGNOSIS — N186 End stage renal disease: Secondary | ICD-10-CM | POA: Diagnosis not present

## 2021-10-24 DIAGNOSIS — Z4932 Encounter for adequacy testing for peritoneal dialysis: Secondary | ICD-10-CM | POA: Diagnosis not present

## 2021-10-24 DIAGNOSIS — D631 Anemia in chronic kidney disease: Secondary | ICD-10-CM | POA: Diagnosis not present

## 2021-10-24 DIAGNOSIS — D509 Iron deficiency anemia, unspecified: Secondary | ICD-10-CM | POA: Diagnosis not present

## 2021-10-24 DIAGNOSIS — N2581 Secondary hyperparathyroidism of renal origin: Secondary | ICD-10-CM | POA: Diagnosis not present

## 2021-10-24 DIAGNOSIS — Z992 Dependence on renal dialysis: Secondary | ICD-10-CM | POA: Diagnosis not present

## 2021-10-25 DIAGNOSIS — D509 Iron deficiency anemia, unspecified: Secondary | ICD-10-CM | POA: Diagnosis not present

## 2021-10-25 DIAGNOSIS — D631 Anemia in chronic kidney disease: Secondary | ICD-10-CM | POA: Diagnosis not present

## 2021-10-25 DIAGNOSIS — N2581 Secondary hyperparathyroidism of renal origin: Secondary | ICD-10-CM | POA: Diagnosis not present

## 2021-10-25 DIAGNOSIS — Z992 Dependence on renal dialysis: Secondary | ICD-10-CM | POA: Diagnosis not present

## 2021-10-25 DIAGNOSIS — N186 End stage renal disease: Secondary | ICD-10-CM | POA: Diagnosis not present

## 2021-10-25 DIAGNOSIS — Z4932 Encounter for adequacy testing for peritoneal dialysis: Secondary | ICD-10-CM | POA: Diagnosis not present

## 2021-10-26 DIAGNOSIS — Z992 Dependence on renal dialysis: Secondary | ICD-10-CM | POA: Diagnosis not present

## 2021-10-26 DIAGNOSIS — N186 End stage renal disease: Secondary | ICD-10-CM | POA: Diagnosis not present

## 2021-10-26 DIAGNOSIS — D509 Iron deficiency anemia, unspecified: Secondary | ICD-10-CM | POA: Diagnosis not present

## 2021-10-26 DIAGNOSIS — D631 Anemia in chronic kidney disease: Secondary | ICD-10-CM | POA: Diagnosis not present

## 2021-10-26 DIAGNOSIS — N2581 Secondary hyperparathyroidism of renal origin: Secondary | ICD-10-CM | POA: Diagnosis not present

## 2021-10-26 DIAGNOSIS — Z4932 Encounter for adequacy testing for peritoneal dialysis: Secondary | ICD-10-CM | POA: Diagnosis not present

## 2021-10-27 DIAGNOSIS — D631 Anemia in chronic kidney disease: Secondary | ICD-10-CM | POA: Diagnosis not present

## 2021-10-27 DIAGNOSIS — N2581 Secondary hyperparathyroidism of renal origin: Secondary | ICD-10-CM | POA: Diagnosis not present

## 2021-10-27 DIAGNOSIS — N186 End stage renal disease: Secondary | ICD-10-CM | POA: Diagnosis not present

## 2021-10-27 DIAGNOSIS — D509 Iron deficiency anemia, unspecified: Secondary | ICD-10-CM | POA: Diagnosis not present

## 2021-10-27 DIAGNOSIS — Z992 Dependence on renal dialysis: Secondary | ICD-10-CM | POA: Diagnosis not present

## 2021-10-27 DIAGNOSIS — Z4932 Encounter for adequacy testing for peritoneal dialysis: Secondary | ICD-10-CM | POA: Diagnosis not present

## 2021-10-28 DIAGNOSIS — N2581 Secondary hyperparathyroidism of renal origin: Secondary | ICD-10-CM | POA: Diagnosis not present

## 2021-10-28 DIAGNOSIS — Z992 Dependence on renal dialysis: Secondary | ICD-10-CM | POA: Diagnosis not present

## 2021-10-28 DIAGNOSIS — D509 Iron deficiency anemia, unspecified: Secondary | ICD-10-CM | POA: Diagnosis not present

## 2021-10-28 DIAGNOSIS — Z4932 Encounter for adequacy testing for peritoneal dialysis: Secondary | ICD-10-CM | POA: Diagnosis not present

## 2021-10-28 DIAGNOSIS — D631 Anemia in chronic kidney disease: Secondary | ICD-10-CM | POA: Diagnosis not present

## 2021-10-28 DIAGNOSIS — N186 End stage renal disease: Secondary | ICD-10-CM | POA: Diagnosis not present

## 2021-10-29 DIAGNOSIS — D509 Iron deficiency anemia, unspecified: Secondary | ICD-10-CM | POA: Diagnosis not present

## 2021-10-29 DIAGNOSIS — N2581 Secondary hyperparathyroidism of renal origin: Secondary | ICD-10-CM | POA: Diagnosis not present

## 2021-10-29 DIAGNOSIS — D631 Anemia in chronic kidney disease: Secondary | ICD-10-CM | POA: Diagnosis not present

## 2021-10-29 DIAGNOSIS — N186 End stage renal disease: Secondary | ICD-10-CM | POA: Diagnosis not present

## 2021-10-29 DIAGNOSIS — Z4932 Encounter for adequacy testing for peritoneal dialysis: Secondary | ICD-10-CM | POA: Diagnosis not present

## 2021-10-29 DIAGNOSIS — Z992 Dependence on renal dialysis: Secondary | ICD-10-CM | POA: Diagnosis not present

## 2021-10-30 DIAGNOSIS — D631 Anemia in chronic kidney disease: Secondary | ICD-10-CM | POA: Diagnosis not present

## 2021-10-30 DIAGNOSIS — N186 End stage renal disease: Secondary | ICD-10-CM | POA: Diagnosis not present

## 2021-10-30 DIAGNOSIS — D509 Iron deficiency anemia, unspecified: Secondary | ICD-10-CM | POA: Diagnosis not present

## 2021-10-30 DIAGNOSIS — Z992 Dependence on renal dialysis: Secondary | ICD-10-CM | POA: Diagnosis not present

## 2021-10-30 DIAGNOSIS — N2581 Secondary hyperparathyroidism of renal origin: Secondary | ICD-10-CM | POA: Diagnosis not present

## 2021-10-30 DIAGNOSIS — Z4932 Encounter for adequacy testing for peritoneal dialysis: Secondary | ICD-10-CM | POA: Diagnosis not present

## 2021-10-31 DIAGNOSIS — Z4932 Encounter for adequacy testing for peritoneal dialysis: Secondary | ICD-10-CM | POA: Diagnosis not present

## 2021-10-31 DIAGNOSIS — D631 Anemia in chronic kidney disease: Secondary | ICD-10-CM | POA: Diagnosis not present

## 2021-10-31 DIAGNOSIS — N2581 Secondary hyperparathyroidism of renal origin: Secondary | ICD-10-CM | POA: Diagnosis not present

## 2021-10-31 DIAGNOSIS — N186 End stage renal disease: Secondary | ICD-10-CM | POA: Diagnosis not present

## 2021-10-31 DIAGNOSIS — D509 Iron deficiency anemia, unspecified: Secondary | ICD-10-CM | POA: Diagnosis not present

## 2021-10-31 DIAGNOSIS — Z992 Dependence on renal dialysis: Secondary | ICD-10-CM | POA: Diagnosis not present

## 2021-11-01 DIAGNOSIS — D509 Iron deficiency anemia, unspecified: Secondary | ICD-10-CM | POA: Diagnosis not present

## 2021-11-01 DIAGNOSIS — D631 Anemia in chronic kidney disease: Secondary | ICD-10-CM | POA: Diagnosis not present

## 2021-11-01 DIAGNOSIS — Z992 Dependence on renal dialysis: Secondary | ICD-10-CM | POA: Diagnosis not present

## 2021-11-01 DIAGNOSIS — Z4932 Encounter for adequacy testing for peritoneal dialysis: Secondary | ICD-10-CM | POA: Diagnosis not present

## 2021-11-01 DIAGNOSIS — N186 End stage renal disease: Secondary | ICD-10-CM | POA: Diagnosis not present

## 2021-11-01 DIAGNOSIS — N2581 Secondary hyperparathyroidism of renal origin: Secondary | ICD-10-CM | POA: Diagnosis not present

## 2021-11-02 DIAGNOSIS — Z992 Dependence on renal dialysis: Secondary | ICD-10-CM | POA: Diagnosis not present

## 2021-11-02 DIAGNOSIS — Z4932 Encounter for adequacy testing for peritoneal dialysis: Secondary | ICD-10-CM | POA: Diagnosis not present

## 2021-11-02 DIAGNOSIS — N2581 Secondary hyperparathyroidism of renal origin: Secondary | ICD-10-CM | POA: Diagnosis not present

## 2021-11-02 DIAGNOSIS — D509 Iron deficiency anemia, unspecified: Secondary | ICD-10-CM | POA: Diagnosis not present

## 2021-11-02 DIAGNOSIS — N186 End stage renal disease: Secondary | ICD-10-CM | POA: Diagnosis not present

## 2021-11-02 DIAGNOSIS — D631 Anemia in chronic kidney disease: Secondary | ICD-10-CM | POA: Diagnosis not present

## 2021-11-03 DIAGNOSIS — N2581 Secondary hyperparathyroidism of renal origin: Secondary | ICD-10-CM | POA: Diagnosis not present

## 2021-11-03 DIAGNOSIS — Z4932 Encounter for adequacy testing for peritoneal dialysis: Secondary | ICD-10-CM | POA: Diagnosis not present

## 2021-11-03 DIAGNOSIS — D631 Anemia in chronic kidney disease: Secondary | ICD-10-CM | POA: Diagnosis not present

## 2021-11-03 DIAGNOSIS — N186 End stage renal disease: Secondary | ICD-10-CM | POA: Diagnosis not present

## 2021-11-03 DIAGNOSIS — D509 Iron deficiency anemia, unspecified: Secondary | ICD-10-CM | POA: Diagnosis not present

## 2021-11-03 DIAGNOSIS — Z992 Dependence on renal dialysis: Secondary | ICD-10-CM | POA: Diagnosis not present

## 2021-11-04 DIAGNOSIS — D631 Anemia in chronic kidney disease: Secondary | ICD-10-CM | POA: Diagnosis not present

## 2021-11-04 DIAGNOSIS — Z992 Dependence on renal dialysis: Secondary | ICD-10-CM | POA: Diagnosis not present

## 2021-11-04 DIAGNOSIS — D509 Iron deficiency anemia, unspecified: Secondary | ICD-10-CM | POA: Diagnosis not present

## 2021-11-04 DIAGNOSIS — Z4932 Encounter for adequacy testing for peritoneal dialysis: Secondary | ICD-10-CM | POA: Diagnosis not present

## 2021-11-04 DIAGNOSIS — N186 End stage renal disease: Secondary | ICD-10-CM | POA: Diagnosis not present

## 2021-11-04 DIAGNOSIS — N2581 Secondary hyperparathyroidism of renal origin: Secondary | ICD-10-CM | POA: Diagnosis not present

## 2021-11-05 DIAGNOSIS — N186 End stage renal disease: Secondary | ICD-10-CM | POA: Diagnosis not present

## 2021-11-05 DIAGNOSIS — N2581 Secondary hyperparathyroidism of renal origin: Secondary | ICD-10-CM | POA: Diagnosis not present

## 2021-11-05 DIAGNOSIS — D509 Iron deficiency anemia, unspecified: Secondary | ICD-10-CM | POA: Diagnosis not present

## 2021-11-05 DIAGNOSIS — Z992 Dependence on renal dialysis: Secondary | ICD-10-CM | POA: Diagnosis not present

## 2021-11-05 DIAGNOSIS — D631 Anemia in chronic kidney disease: Secondary | ICD-10-CM | POA: Diagnosis not present

## 2021-11-05 DIAGNOSIS — Z4932 Encounter for adequacy testing for peritoneal dialysis: Secondary | ICD-10-CM | POA: Diagnosis not present

## 2021-11-06 DIAGNOSIS — N2581 Secondary hyperparathyroidism of renal origin: Secondary | ICD-10-CM | POA: Diagnosis not present

## 2021-11-06 DIAGNOSIS — D631 Anemia in chronic kidney disease: Secondary | ICD-10-CM | POA: Diagnosis not present

## 2021-11-06 DIAGNOSIS — Z4932 Encounter for adequacy testing for peritoneal dialysis: Secondary | ICD-10-CM | POA: Diagnosis not present

## 2021-11-06 DIAGNOSIS — N186 End stage renal disease: Secondary | ICD-10-CM | POA: Diagnosis not present

## 2021-11-06 DIAGNOSIS — D509 Iron deficiency anemia, unspecified: Secondary | ICD-10-CM | POA: Diagnosis not present

## 2021-11-06 DIAGNOSIS — Z992 Dependence on renal dialysis: Secondary | ICD-10-CM | POA: Diagnosis not present

## 2021-11-07 DIAGNOSIS — N186 End stage renal disease: Secondary | ICD-10-CM | POA: Diagnosis not present

## 2021-11-07 DIAGNOSIS — D509 Iron deficiency anemia, unspecified: Secondary | ICD-10-CM | POA: Diagnosis not present

## 2021-11-07 DIAGNOSIS — N2581 Secondary hyperparathyroidism of renal origin: Secondary | ICD-10-CM | POA: Diagnosis not present

## 2021-11-07 DIAGNOSIS — Z992 Dependence on renal dialysis: Secondary | ICD-10-CM | POA: Diagnosis not present

## 2021-11-07 DIAGNOSIS — Z4932 Encounter for adequacy testing for peritoneal dialysis: Secondary | ICD-10-CM | POA: Diagnosis not present

## 2021-11-07 DIAGNOSIS — D631 Anemia in chronic kidney disease: Secondary | ICD-10-CM | POA: Diagnosis not present

## 2021-11-08 DIAGNOSIS — N2581 Secondary hyperparathyroidism of renal origin: Secondary | ICD-10-CM | POA: Diagnosis not present

## 2021-11-08 DIAGNOSIS — Z4932 Encounter for adequacy testing for peritoneal dialysis: Secondary | ICD-10-CM | POA: Diagnosis not present

## 2021-11-08 DIAGNOSIS — D509 Iron deficiency anemia, unspecified: Secondary | ICD-10-CM | POA: Diagnosis not present

## 2021-11-08 DIAGNOSIS — Z992 Dependence on renal dialysis: Secondary | ICD-10-CM | POA: Diagnosis not present

## 2021-11-08 DIAGNOSIS — N186 End stage renal disease: Secondary | ICD-10-CM | POA: Diagnosis not present

## 2021-11-08 DIAGNOSIS — D631 Anemia in chronic kidney disease: Secondary | ICD-10-CM | POA: Diagnosis not present

## 2021-11-09 DIAGNOSIS — N2581 Secondary hyperparathyroidism of renal origin: Secondary | ICD-10-CM | POA: Diagnosis not present

## 2021-11-09 DIAGNOSIS — N186 End stage renal disease: Secondary | ICD-10-CM | POA: Diagnosis not present

## 2021-11-09 DIAGNOSIS — D631 Anemia in chronic kidney disease: Secondary | ICD-10-CM | POA: Diagnosis not present

## 2021-11-09 DIAGNOSIS — D509 Iron deficiency anemia, unspecified: Secondary | ICD-10-CM | POA: Diagnosis not present

## 2021-11-09 DIAGNOSIS — Z992 Dependence on renal dialysis: Secondary | ICD-10-CM | POA: Diagnosis not present

## 2021-11-09 DIAGNOSIS — Z4932 Encounter for adequacy testing for peritoneal dialysis: Secondary | ICD-10-CM | POA: Diagnosis not present

## 2021-11-10 DIAGNOSIS — D631 Anemia in chronic kidney disease: Secondary | ICD-10-CM | POA: Diagnosis not present

## 2021-11-10 DIAGNOSIS — N186 End stage renal disease: Secondary | ICD-10-CM | POA: Diagnosis not present

## 2021-11-10 DIAGNOSIS — Z992 Dependence on renal dialysis: Secondary | ICD-10-CM | POA: Diagnosis not present

## 2021-11-10 DIAGNOSIS — N2581 Secondary hyperparathyroidism of renal origin: Secondary | ICD-10-CM | POA: Diagnosis not present

## 2021-11-10 DIAGNOSIS — Z4932 Encounter for adequacy testing for peritoneal dialysis: Secondary | ICD-10-CM | POA: Diagnosis not present

## 2021-11-10 DIAGNOSIS — D509 Iron deficiency anemia, unspecified: Secondary | ICD-10-CM | POA: Diagnosis not present

## 2021-11-11 DIAGNOSIS — N186 End stage renal disease: Secondary | ICD-10-CM | POA: Diagnosis not present

## 2021-11-11 DIAGNOSIS — I129 Hypertensive chronic kidney disease with stage 1 through stage 4 chronic kidney disease, or unspecified chronic kidney disease: Secondary | ICD-10-CM | POA: Diagnosis not present

## 2021-11-11 DIAGNOSIS — D631 Anemia in chronic kidney disease: Secondary | ICD-10-CM | POA: Diagnosis not present

## 2021-11-11 DIAGNOSIS — Z992 Dependence on renal dialysis: Secondary | ICD-10-CM | POA: Diagnosis not present

## 2021-11-11 DIAGNOSIS — D509 Iron deficiency anemia, unspecified: Secondary | ICD-10-CM | POA: Diagnosis not present

## 2021-11-11 DIAGNOSIS — Z4932 Encounter for adequacy testing for peritoneal dialysis: Secondary | ICD-10-CM | POA: Diagnosis not present

## 2021-11-11 DIAGNOSIS — N2581 Secondary hyperparathyroidism of renal origin: Secondary | ICD-10-CM | POA: Diagnosis not present

## 2021-11-12 DIAGNOSIS — D631 Anemia in chronic kidney disease: Secondary | ICD-10-CM | POA: Diagnosis not present

## 2021-11-12 DIAGNOSIS — Z4932 Encounter for adequacy testing for peritoneal dialysis: Secondary | ICD-10-CM | POA: Diagnosis not present

## 2021-11-12 DIAGNOSIS — N2581 Secondary hyperparathyroidism of renal origin: Secondary | ICD-10-CM | POA: Diagnosis not present

## 2021-11-12 DIAGNOSIS — Z992 Dependence on renal dialysis: Secondary | ICD-10-CM | POA: Diagnosis not present

## 2021-11-12 DIAGNOSIS — N186 End stage renal disease: Secondary | ICD-10-CM | POA: Diagnosis not present

## 2021-11-12 DIAGNOSIS — Z23 Encounter for immunization: Secondary | ICD-10-CM | POA: Diagnosis not present

## 2021-11-13 DIAGNOSIS — D631 Anemia in chronic kidney disease: Secondary | ICD-10-CM | POA: Diagnosis not present

## 2021-11-13 DIAGNOSIS — Z4932 Encounter for adequacy testing for peritoneal dialysis: Secondary | ICD-10-CM | POA: Diagnosis not present

## 2021-11-13 DIAGNOSIS — Z992 Dependence on renal dialysis: Secondary | ICD-10-CM | POA: Diagnosis not present

## 2021-11-13 DIAGNOSIS — N2581 Secondary hyperparathyroidism of renal origin: Secondary | ICD-10-CM | POA: Diagnosis not present

## 2021-11-13 DIAGNOSIS — N186 End stage renal disease: Secondary | ICD-10-CM | POA: Diagnosis not present

## 2021-11-13 DIAGNOSIS — Z23 Encounter for immunization: Secondary | ICD-10-CM | POA: Diagnosis not present

## 2021-11-14 DIAGNOSIS — Z992 Dependence on renal dialysis: Secondary | ICD-10-CM | POA: Diagnosis not present

## 2021-11-14 DIAGNOSIS — N2581 Secondary hyperparathyroidism of renal origin: Secondary | ICD-10-CM | POA: Diagnosis not present

## 2021-11-14 DIAGNOSIS — N186 End stage renal disease: Secondary | ICD-10-CM | POA: Diagnosis not present

## 2021-11-14 DIAGNOSIS — Z23 Encounter for immunization: Secondary | ICD-10-CM | POA: Diagnosis not present

## 2021-11-14 DIAGNOSIS — Z4932 Encounter for adequacy testing for peritoneal dialysis: Secondary | ICD-10-CM | POA: Diagnosis not present

## 2021-11-14 DIAGNOSIS — D631 Anemia in chronic kidney disease: Secondary | ICD-10-CM | POA: Diagnosis not present

## 2021-11-15 DIAGNOSIS — N2581 Secondary hyperparathyroidism of renal origin: Secondary | ICD-10-CM | POA: Diagnosis not present

## 2021-11-15 DIAGNOSIS — D631 Anemia in chronic kidney disease: Secondary | ICD-10-CM | POA: Diagnosis not present

## 2021-11-15 DIAGNOSIS — Z4932 Encounter for adequacy testing for peritoneal dialysis: Secondary | ICD-10-CM | POA: Diagnosis not present

## 2021-11-15 DIAGNOSIS — Z992 Dependence on renal dialysis: Secondary | ICD-10-CM | POA: Diagnosis not present

## 2021-11-15 DIAGNOSIS — N186 End stage renal disease: Secondary | ICD-10-CM | POA: Diagnosis not present

## 2021-11-15 DIAGNOSIS — Z23 Encounter for immunization: Secondary | ICD-10-CM | POA: Diagnosis not present

## 2021-11-16 DIAGNOSIS — Z4932 Encounter for adequacy testing for peritoneal dialysis: Secondary | ICD-10-CM | POA: Diagnosis not present

## 2021-11-16 DIAGNOSIS — Z23 Encounter for immunization: Secondary | ICD-10-CM | POA: Diagnosis not present

## 2021-11-16 DIAGNOSIS — N186 End stage renal disease: Secondary | ICD-10-CM | POA: Diagnosis not present

## 2021-11-16 DIAGNOSIS — Z992 Dependence on renal dialysis: Secondary | ICD-10-CM | POA: Diagnosis not present

## 2021-11-16 DIAGNOSIS — N2581 Secondary hyperparathyroidism of renal origin: Secondary | ICD-10-CM | POA: Diagnosis not present

## 2021-11-16 DIAGNOSIS — D631 Anemia in chronic kidney disease: Secondary | ICD-10-CM | POA: Diagnosis not present

## 2021-11-17 DIAGNOSIS — N186 End stage renal disease: Secondary | ICD-10-CM | POA: Diagnosis not present

## 2021-11-17 DIAGNOSIS — Z23 Encounter for immunization: Secondary | ICD-10-CM | POA: Diagnosis not present

## 2021-11-17 DIAGNOSIS — Z4932 Encounter for adequacy testing for peritoneal dialysis: Secondary | ICD-10-CM | POA: Diagnosis not present

## 2021-11-17 DIAGNOSIS — D631 Anemia in chronic kidney disease: Secondary | ICD-10-CM | POA: Diagnosis not present

## 2021-11-17 DIAGNOSIS — N2581 Secondary hyperparathyroidism of renal origin: Secondary | ICD-10-CM | POA: Diagnosis not present

## 2021-11-17 DIAGNOSIS — Z992 Dependence on renal dialysis: Secondary | ICD-10-CM | POA: Diagnosis not present

## 2021-11-18 DIAGNOSIS — Z992 Dependence on renal dialysis: Secondary | ICD-10-CM | POA: Diagnosis not present

## 2021-11-18 DIAGNOSIS — N2581 Secondary hyperparathyroidism of renal origin: Secondary | ICD-10-CM | POA: Diagnosis not present

## 2021-11-18 DIAGNOSIS — N186 End stage renal disease: Secondary | ICD-10-CM | POA: Diagnosis not present

## 2021-11-18 DIAGNOSIS — D631 Anemia in chronic kidney disease: Secondary | ICD-10-CM | POA: Diagnosis not present

## 2021-11-18 DIAGNOSIS — Z4932 Encounter for adequacy testing for peritoneal dialysis: Secondary | ICD-10-CM | POA: Diagnosis not present

## 2021-11-18 DIAGNOSIS — Z23 Encounter for immunization: Secondary | ICD-10-CM | POA: Diagnosis not present

## 2021-11-19 DIAGNOSIS — Z4932 Encounter for adequacy testing for peritoneal dialysis: Secondary | ICD-10-CM | POA: Diagnosis not present

## 2021-11-19 DIAGNOSIS — N2581 Secondary hyperparathyroidism of renal origin: Secondary | ICD-10-CM | POA: Diagnosis not present

## 2021-11-19 DIAGNOSIS — D631 Anemia in chronic kidney disease: Secondary | ICD-10-CM | POA: Diagnosis not present

## 2021-11-19 DIAGNOSIS — N186 End stage renal disease: Secondary | ICD-10-CM | POA: Diagnosis not present

## 2021-11-19 DIAGNOSIS — Z992 Dependence on renal dialysis: Secondary | ICD-10-CM | POA: Diagnosis not present

## 2021-11-19 DIAGNOSIS — Z23 Encounter for immunization: Secondary | ICD-10-CM | POA: Diagnosis not present

## 2021-11-20 DIAGNOSIS — N186 End stage renal disease: Secondary | ICD-10-CM | POA: Diagnosis not present

## 2021-11-20 DIAGNOSIS — N2581 Secondary hyperparathyroidism of renal origin: Secondary | ICD-10-CM | POA: Diagnosis not present

## 2021-11-20 DIAGNOSIS — Z4932 Encounter for adequacy testing for peritoneal dialysis: Secondary | ICD-10-CM | POA: Diagnosis not present

## 2021-11-20 DIAGNOSIS — Z23 Encounter for immunization: Secondary | ICD-10-CM | POA: Diagnosis not present

## 2021-11-20 DIAGNOSIS — Z992 Dependence on renal dialysis: Secondary | ICD-10-CM | POA: Diagnosis not present

## 2021-11-20 DIAGNOSIS — D631 Anemia in chronic kidney disease: Secondary | ICD-10-CM | POA: Diagnosis not present

## 2021-11-21 DIAGNOSIS — N2581 Secondary hyperparathyroidism of renal origin: Secondary | ICD-10-CM | POA: Diagnosis not present

## 2021-11-21 DIAGNOSIS — Z4932 Encounter for adequacy testing for peritoneal dialysis: Secondary | ICD-10-CM | POA: Diagnosis not present

## 2021-11-21 DIAGNOSIS — Z992 Dependence on renal dialysis: Secondary | ICD-10-CM | POA: Diagnosis not present

## 2021-11-21 DIAGNOSIS — D631 Anemia in chronic kidney disease: Secondary | ICD-10-CM | POA: Diagnosis not present

## 2021-11-21 DIAGNOSIS — Z23 Encounter for immunization: Secondary | ICD-10-CM | POA: Diagnosis not present

## 2021-11-21 DIAGNOSIS — N186 End stage renal disease: Secondary | ICD-10-CM | POA: Diagnosis not present

## 2021-11-22 DIAGNOSIS — Z992 Dependence on renal dialysis: Secondary | ICD-10-CM | POA: Diagnosis not present

## 2021-11-22 DIAGNOSIS — D631 Anemia in chronic kidney disease: Secondary | ICD-10-CM | POA: Diagnosis not present

## 2021-11-22 DIAGNOSIS — Z23 Encounter for immunization: Secondary | ICD-10-CM | POA: Diagnosis not present

## 2021-11-22 DIAGNOSIS — N186 End stage renal disease: Secondary | ICD-10-CM | POA: Diagnosis not present

## 2021-11-22 DIAGNOSIS — Z4932 Encounter for adequacy testing for peritoneal dialysis: Secondary | ICD-10-CM | POA: Diagnosis not present

## 2021-11-22 DIAGNOSIS — N2581 Secondary hyperparathyroidism of renal origin: Secondary | ICD-10-CM | POA: Diagnosis not present

## 2021-11-23 DIAGNOSIS — N2581 Secondary hyperparathyroidism of renal origin: Secondary | ICD-10-CM | POA: Diagnosis not present

## 2021-11-23 DIAGNOSIS — Z23 Encounter for immunization: Secondary | ICD-10-CM | POA: Diagnosis not present

## 2021-11-23 DIAGNOSIS — Z992 Dependence on renal dialysis: Secondary | ICD-10-CM | POA: Diagnosis not present

## 2021-11-23 DIAGNOSIS — D631 Anemia in chronic kidney disease: Secondary | ICD-10-CM | POA: Diagnosis not present

## 2021-11-23 DIAGNOSIS — Z4932 Encounter for adequacy testing for peritoneal dialysis: Secondary | ICD-10-CM | POA: Diagnosis not present

## 2021-11-23 DIAGNOSIS — N186 End stage renal disease: Secondary | ICD-10-CM | POA: Diagnosis not present

## 2021-11-24 DIAGNOSIS — D631 Anemia in chronic kidney disease: Secondary | ICD-10-CM | POA: Diagnosis not present

## 2021-11-24 DIAGNOSIS — Z992 Dependence on renal dialysis: Secondary | ICD-10-CM | POA: Diagnosis not present

## 2021-11-24 DIAGNOSIS — N2581 Secondary hyperparathyroidism of renal origin: Secondary | ICD-10-CM | POA: Diagnosis not present

## 2021-11-24 DIAGNOSIS — Z4932 Encounter for adequacy testing for peritoneal dialysis: Secondary | ICD-10-CM | POA: Diagnosis not present

## 2021-11-24 DIAGNOSIS — N186 End stage renal disease: Secondary | ICD-10-CM | POA: Diagnosis not present

## 2021-11-24 DIAGNOSIS — Z23 Encounter for immunization: Secondary | ICD-10-CM | POA: Diagnosis not present

## 2021-11-25 DIAGNOSIS — Z23 Encounter for immunization: Secondary | ICD-10-CM | POA: Diagnosis not present

## 2021-11-25 DIAGNOSIS — N2581 Secondary hyperparathyroidism of renal origin: Secondary | ICD-10-CM | POA: Diagnosis not present

## 2021-11-25 DIAGNOSIS — Z4932 Encounter for adequacy testing for peritoneal dialysis: Secondary | ICD-10-CM | POA: Diagnosis not present

## 2021-11-25 DIAGNOSIS — N186 End stage renal disease: Secondary | ICD-10-CM | POA: Diagnosis not present

## 2021-11-25 DIAGNOSIS — Z992 Dependence on renal dialysis: Secondary | ICD-10-CM | POA: Diagnosis not present

## 2021-11-25 DIAGNOSIS — D631 Anemia in chronic kidney disease: Secondary | ICD-10-CM | POA: Diagnosis not present

## 2021-11-26 DIAGNOSIS — D631 Anemia in chronic kidney disease: Secondary | ICD-10-CM | POA: Diagnosis not present

## 2021-11-26 DIAGNOSIS — N186 End stage renal disease: Secondary | ICD-10-CM | POA: Diagnosis not present

## 2021-11-26 DIAGNOSIS — N2581 Secondary hyperparathyroidism of renal origin: Secondary | ICD-10-CM | POA: Diagnosis not present

## 2021-11-26 DIAGNOSIS — Z992 Dependence on renal dialysis: Secondary | ICD-10-CM | POA: Diagnosis not present

## 2021-11-26 DIAGNOSIS — Z23 Encounter for immunization: Secondary | ICD-10-CM | POA: Diagnosis not present

## 2021-11-26 DIAGNOSIS — Z4932 Encounter for adequacy testing for peritoneal dialysis: Secondary | ICD-10-CM | POA: Diagnosis not present

## 2021-11-27 DIAGNOSIS — D631 Anemia in chronic kidney disease: Secondary | ICD-10-CM | POA: Diagnosis not present

## 2021-11-27 DIAGNOSIS — Z4932 Encounter for adequacy testing for peritoneal dialysis: Secondary | ICD-10-CM | POA: Diagnosis not present

## 2021-11-27 DIAGNOSIS — Z23 Encounter for immunization: Secondary | ICD-10-CM | POA: Diagnosis not present

## 2021-11-27 DIAGNOSIS — N186 End stage renal disease: Secondary | ICD-10-CM | POA: Diagnosis not present

## 2021-11-27 DIAGNOSIS — Z992 Dependence on renal dialysis: Secondary | ICD-10-CM | POA: Diagnosis not present

## 2021-11-27 DIAGNOSIS — N2581 Secondary hyperparathyroidism of renal origin: Secondary | ICD-10-CM | POA: Diagnosis not present

## 2021-11-28 DIAGNOSIS — N2581 Secondary hyperparathyroidism of renal origin: Secondary | ICD-10-CM | POA: Diagnosis not present

## 2021-11-28 DIAGNOSIS — N186 End stage renal disease: Secondary | ICD-10-CM | POA: Diagnosis not present

## 2021-11-28 DIAGNOSIS — Z992 Dependence on renal dialysis: Secondary | ICD-10-CM | POA: Diagnosis not present

## 2021-11-28 DIAGNOSIS — Z4932 Encounter for adequacy testing for peritoneal dialysis: Secondary | ICD-10-CM | POA: Diagnosis not present

## 2021-11-28 DIAGNOSIS — Z23 Encounter for immunization: Secondary | ICD-10-CM | POA: Diagnosis not present

## 2021-11-28 DIAGNOSIS — D631 Anemia in chronic kidney disease: Secondary | ICD-10-CM | POA: Diagnosis not present

## 2021-11-29 DIAGNOSIS — Z23 Encounter for immunization: Secondary | ICD-10-CM | POA: Diagnosis not present

## 2021-11-29 DIAGNOSIS — Z992 Dependence on renal dialysis: Secondary | ICD-10-CM | POA: Diagnosis not present

## 2021-11-29 DIAGNOSIS — Z4932 Encounter for adequacy testing for peritoneal dialysis: Secondary | ICD-10-CM | POA: Diagnosis not present

## 2021-11-29 DIAGNOSIS — N2581 Secondary hyperparathyroidism of renal origin: Secondary | ICD-10-CM | POA: Diagnosis not present

## 2021-11-29 DIAGNOSIS — D631 Anemia in chronic kidney disease: Secondary | ICD-10-CM | POA: Diagnosis not present

## 2021-11-29 DIAGNOSIS — N186 End stage renal disease: Secondary | ICD-10-CM | POA: Diagnosis not present

## 2021-11-30 DIAGNOSIS — N186 End stage renal disease: Secondary | ICD-10-CM | POA: Diagnosis not present

## 2021-11-30 DIAGNOSIS — D631 Anemia in chronic kidney disease: Secondary | ICD-10-CM | POA: Diagnosis not present

## 2021-11-30 DIAGNOSIS — N2581 Secondary hyperparathyroidism of renal origin: Secondary | ICD-10-CM | POA: Diagnosis not present

## 2021-11-30 DIAGNOSIS — Z992 Dependence on renal dialysis: Secondary | ICD-10-CM | POA: Diagnosis not present

## 2021-11-30 DIAGNOSIS — Z4932 Encounter for adequacy testing for peritoneal dialysis: Secondary | ICD-10-CM | POA: Diagnosis not present

## 2021-11-30 DIAGNOSIS — Z23 Encounter for immunization: Secondary | ICD-10-CM | POA: Diagnosis not present

## 2021-12-01 DIAGNOSIS — N186 End stage renal disease: Secondary | ICD-10-CM | POA: Diagnosis not present

## 2021-12-01 DIAGNOSIS — Z23 Encounter for immunization: Secondary | ICD-10-CM | POA: Diagnosis not present

## 2021-12-01 DIAGNOSIS — D631 Anemia in chronic kidney disease: Secondary | ICD-10-CM | POA: Diagnosis not present

## 2021-12-01 DIAGNOSIS — Z4932 Encounter for adequacy testing for peritoneal dialysis: Secondary | ICD-10-CM | POA: Diagnosis not present

## 2021-12-01 DIAGNOSIS — N2581 Secondary hyperparathyroidism of renal origin: Secondary | ICD-10-CM | POA: Diagnosis not present

## 2021-12-01 DIAGNOSIS — Z992 Dependence on renal dialysis: Secondary | ICD-10-CM | POA: Diagnosis not present

## 2021-12-02 DIAGNOSIS — N186 End stage renal disease: Secondary | ICD-10-CM | POA: Diagnosis not present

## 2021-12-02 DIAGNOSIS — Z992 Dependence on renal dialysis: Secondary | ICD-10-CM | POA: Diagnosis not present

## 2021-12-02 DIAGNOSIS — D631 Anemia in chronic kidney disease: Secondary | ICD-10-CM | POA: Diagnosis not present

## 2021-12-02 DIAGNOSIS — Z4932 Encounter for adequacy testing for peritoneal dialysis: Secondary | ICD-10-CM | POA: Diagnosis not present

## 2021-12-02 DIAGNOSIS — N2581 Secondary hyperparathyroidism of renal origin: Secondary | ICD-10-CM | POA: Diagnosis not present

## 2021-12-02 DIAGNOSIS — Z23 Encounter for immunization: Secondary | ICD-10-CM | POA: Diagnosis not present

## 2021-12-03 DIAGNOSIS — N186 End stage renal disease: Secondary | ICD-10-CM | POA: Diagnosis not present

## 2021-12-03 DIAGNOSIS — Z23 Encounter for immunization: Secondary | ICD-10-CM | POA: Diagnosis not present

## 2021-12-03 DIAGNOSIS — N2581 Secondary hyperparathyroidism of renal origin: Secondary | ICD-10-CM | POA: Diagnosis not present

## 2021-12-03 DIAGNOSIS — D631 Anemia in chronic kidney disease: Secondary | ICD-10-CM | POA: Diagnosis not present

## 2021-12-03 DIAGNOSIS — Z4932 Encounter for adequacy testing for peritoneal dialysis: Secondary | ICD-10-CM | POA: Diagnosis not present

## 2021-12-03 DIAGNOSIS — Z992 Dependence on renal dialysis: Secondary | ICD-10-CM | POA: Diagnosis not present

## 2021-12-04 DIAGNOSIS — N2581 Secondary hyperparathyroidism of renal origin: Secondary | ICD-10-CM | POA: Diagnosis not present

## 2021-12-04 DIAGNOSIS — Z4932 Encounter for adequacy testing for peritoneal dialysis: Secondary | ICD-10-CM | POA: Diagnosis not present

## 2021-12-04 DIAGNOSIS — D631 Anemia in chronic kidney disease: Secondary | ICD-10-CM | POA: Diagnosis not present

## 2021-12-04 DIAGNOSIS — Z992 Dependence on renal dialysis: Secondary | ICD-10-CM | POA: Diagnosis not present

## 2021-12-04 DIAGNOSIS — N186 End stage renal disease: Secondary | ICD-10-CM | POA: Diagnosis not present

## 2021-12-04 DIAGNOSIS — Z23 Encounter for immunization: Secondary | ICD-10-CM | POA: Diagnosis not present

## 2021-12-05 DIAGNOSIS — Z23 Encounter for immunization: Secondary | ICD-10-CM | POA: Diagnosis not present

## 2021-12-05 DIAGNOSIS — N2581 Secondary hyperparathyroidism of renal origin: Secondary | ICD-10-CM | POA: Diagnosis not present

## 2021-12-05 DIAGNOSIS — Z4932 Encounter for adequacy testing for peritoneal dialysis: Secondary | ICD-10-CM | POA: Diagnosis not present

## 2021-12-05 DIAGNOSIS — N186 End stage renal disease: Secondary | ICD-10-CM | POA: Diagnosis not present

## 2021-12-05 DIAGNOSIS — D631 Anemia in chronic kidney disease: Secondary | ICD-10-CM | POA: Diagnosis not present

## 2021-12-05 DIAGNOSIS — Z992 Dependence on renal dialysis: Secondary | ICD-10-CM | POA: Diagnosis not present

## 2021-12-06 DIAGNOSIS — Z992 Dependence on renal dialysis: Secondary | ICD-10-CM | POA: Diagnosis not present

## 2021-12-06 DIAGNOSIS — N2581 Secondary hyperparathyroidism of renal origin: Secondary | ICD-10-CM | POA: Diagnosis not present

## 2021-12-06 DIAGNOSIS — Z23 Encounter for immunization: Secondary | ICD-10-CM | POA: Diagnosis not present

## 2021-12-06 DIAGNOSIS — D631 Anemia in chronic kidney disease: Secondary | ICD-10-CM | POA: Diagnosis not present

## 2021-12-06 DIAGNOSIS — N186 End stage renal disease: Secondary | ICD-10-CM | POA: Diagnosis not present

## 2021-12-06 DIAGNOSIS — Z4932 Encounter for adequacy testing for peritoneal dialysis: Secondary | ICD-10-CM | POA: Diagnosis not present

## 2021-12-07 DIAGNOSIS — D631 Anemia in chronic kidney disease: Secondary | ICD-10-CM | POA: Diagnosis not present

## 2021-12-07 DIAGNOSIS — N2581 Secondary hyperparathyroidism of renal origin: Secondary | ICD-10-CM | POA: Diagnosis not present

## 2021-12-07 DIAGNOSIS — Z992 Dependence on renal dialysis: Secondary | ICD-10-CM | POA: Diagnosis not present

## 2021-12-07 DIAGNOSIS — N186 End stage renal disease: Secondary | ICD-10-CM | POA: Diagnosis not present

## 2021-12-07 DIAGNOSIS — Z4932 Encounter for adequacy testing for peritoneal dialysis: Secondary | ICD-10-CM | POA: Diagnosis not present

## 2021-12-07 DIAGNOSIS — Z23 Encounter for immunization: Secondary | ICD-10-CM | POA: Diagnosis not present

## 2021-12-08 DIAGNOSIS — N2581 Secondary hyperparathyroidism of renal origin: Secondary | ICD-10-CM | POA: Diagnosis not present

## 2021-12-08 DIAGNOSIS — N186 End stage renal disease: Secondary | ICD-10-CM | POA: Diagnosis not present

## 2021-12-08 DIAGNOSIS — D631 Anemia in chronic kidney disease: Secondary | ICD-10-CM | POA: Diagnosis not present

## 2021-12-08 DIAGNOSIS — Z23 Encounter for immunization: Secondary | ICD-10-CM | POA: Diagnosis not present

## 2021-12-08 DIAGNOSIS — Z992 Dependence on renal dialysis: Secondary | ICD-10-CM | POA: Diagnosis not present

## 2021-12-08 DIAGNOSIS — Z4932 Encounter for adequacy testing for peritoneal dialysis: Secondary | ICD-10-CM | POA: Diagnosis not present

## 2021-12-09 DIAGNOSIS — D631 Anemia in chronic kidney disease: Secondary | ICD-10-CM | POA: Diagnosis not present

## 2021-12-09 DIAGNOSIS — N186 End stage renal disease: Secondary | ICD-10-CM | POA: Diagnosis not present

## 2021-12-09 DIAGNOSIS — Z4932 Encounter for adequacy testing for peritoneal dialysis: Secondary | ICD-10-CM | POA: Diagnosis not present

## 2021-12-09 DIAGNOSIS — Z23 Encounter for immunization: Secondary | ICD-10-CM | POA: Diagnosis not present

## 2021-12-09 DIAGNOSIS — Z992 Dependence on renal dialysis: Secondary | ICD-10-CM | POA: Diagnosis not present

## 2021-12-09 DIAGNOSIS — N2581 Secondary hyperparathyroidism of renal origin: Secondary | ICD-10-CM | POA: Diagnosis not present

## 2021-12-10 DIAGNOSIS — Z992 Dependence on renal dialysis: Secondary | ICD-10-CM | POA: Diagnosis not present

## 2021-12-10 DIAGNOSIS — N186 End stage renal disease: Secondary | ICD-10-CM | POA: Diagnosis not present

## 2021-12-10 DIAGNOSIS — Z23 Encounter for immunization: Secondary | ICD-10-CM | POA: Diagnosis not present

## 2021-12-10 DIAGNOSIS — D631 Anemia in chronic kidney disease: Secondary | ICD-10-CM | POA: Diagnosis not present

## 2021-12-10 DIAGNOSIS — Z4932 Encounter for adequacy testing for peritoneal dialysis: Secondary | ICD-10-CM | POA: Diagnosis not present

## 2021-12-10 DIAGNOSIS — N2581 Secondary hyperparathyroidism of renal origin: Secondary | ICD-10-CM | POA: Diagnosis not present

## 2021-12-11 DIAGNOSIS — Z4932 Encounter for adequacy testing for peritoneal dialysis: Secondary | ICD-10-CM | POA: Diagnosis not present

## 2021-12-11 DIAGNOSIS — N2581 Secondary hyperparathyroidism of renal origin: Secondary | ICD-10-CM | POA: Diagnosis not present

## 2021-12-11 DIAGNOSIS — N186 End stage renal disease: Secondary | ICD-10-CM | POA: Diagnosis not present

## 2021-12-11 DIAGNOSIS — Z23 Encounter for immunization: Secondary | ICD-10-CM | POA: Diagnosis not present

## 2021-12-11 DIAGNOSIS — Z992 Dependence on renal dialysis: Secondary | ICD-10-CM | POA: Diagnosis not present

## 2021-12-11 DIAGNOSIS — D631 Anemia in chronic kidney disease: Secondary | ICD-10-CM | POA: Diagnosis not present

## 2021-12-12 DIAGNOSIS — I129 Hypertensive chronic kidney disease with stage 1 through stage 4 chronic kidney disease, or unspecified chronic kidney disease: Secondary | ICD-10-CM | POA: Diagnosis not present

## 2021-12-12 DIAGNOSIS — N2581 Secondary hyperparathyroidism of renal origin: Secondary | ICD-10-CM | POA: Diagnosis not present

## 2021-12-12 DIAGNOSIS — Z992 Dependence on renal dialysis: Secondary | ICD-10-CM | POA: Diagnosis not present

## 2021-12-12 DIAGNOSIS — Z23 Encounter for immunization: Secondary | ICD-10-CM | POA: Diagnosis not present

## 2021-12-12 DIAGNOSIS — Z4932 Encounter for adequacy testing for peritoneal dialysis: Secondary | ICD-10-CM | POA: Diagnosis not present

## 2021-12-12 DIAGNOSIS — N186 End stage renal disease: Secondary | ICD-10-CM | POA: Diagnosis not present

## 2021-12-12 DIAGNOSIS — D631 Anemia in chronic kidney disease: Secondary | ICD-10-CM | POA: Diagnosis not present

## 2021-12-13 DIAGNOSIS — Z4932 Encounter for adequacy testing for peritoneal dialysis: Secondary | ICD-10-CM | POA: Diagnosis not present

## 2021-12-13 DIAGNOSIS — D509 Iron deficiency anemia, unspecified: Secondary | ICD-10-CM | POA: Diagnosis not present

## 2021-12-13 DIAGNOSIS — N2581 Secondary hyperparathyroidism of renal origin: Secondary | ICD-10-CM | POA: Diagnosis not present

## 2021-12-13 DIAGNOSIS — Z23 Encounter for immunization: Secondary | ICD-10-CM | POA: Diagnosis not present

## 2021-12-13 DIAGNOSIS — N185 Chronic kidney disease, stage 5: Secondary | ICD-10-CM | POA: Diagnosis not present

## 2021-12-13 DIAGNOSIS — D631 Anemia in chronic kidney disease: Secondary | ICD-10-CM | POA: Diagnosis not present

## 2021-12-13 DIAGNOSIS — Z992 Dependence on renal dialysis: Secondary | ICD-10-CM | POA: Diagnosis not present

## 2021-12-13 DIAGNOSIS — N186 End stage renal disease: Secondary | ICD-10-CM | POA: Diagnosis not present

## 2021-12-14 DIAGNOSIS — N186 End stage renal disease: Secondary | ICD-10-CM | POA: Diagnosis not present

## 2021-12-14 DIAGNOSIS — D509 Iron deficiency anemia, unspecified: Secondary | ICD-10-CM | POA: Diagnosis not present

## 2021-12-14 DIAGNOSIS — N2581 Secondary hyperparathyroidism of renal origin: Secondary | ICD-10-CM | POA: Diagnosis not present

## 2021-12-14 DIAGNOSIS — Z992 Dependence on renal dialysis: Secondary | ICD-10-CM | POA: Diagnosis not present

## 2021-12-14 DIAGNOSIS — Z4932 Encounter for adequacy testing for peritoneal dialysis: Secondary | ICD-10-CM | POA: Diagnosis not present

## 2021-12-14 DIAGNOSIS — Z23 Encounter for immunization: Secondary | ICD-10-CM | POA: Diagnosis not present

## 2021-12-14 DIAGNOSIS — D631 Anemia in chronic kidney disease: Secondary | ICD-10-CM | POA: Diagnosis not present

## 2021-12-14 DIAGNOSIS — N185 Chronic kidney disease, stage 5: Secondary | ICD-10-CM | POA: Diagnosis not present

## 2021-12-15 DIAGNOSIS — N186 End stage renal disease: Secondary | ICD-10-CM | POA: Diagnosis not present

## 2021-12-15 DIAGNOSIS — N185 Chronic kidney disease, stage 5: Secondary | ICD-10-CM | POA: Diagnosis not present

## 2021-12-15 DIAGNOSIS — Z23 Encounter for immunization: Secondary | ICD-10-CM | POA: Diagnosis not present

## 2021-12-15 DIAGNOSIS — Z4932 Encounter for adequacy testing for peritoneal dialysis: Secondary | ICD-10-CM | POA: Diagnosis not present

## 2021-12-15 DIAGNOSIS — N2581 Secondary hyperparathyroidism of renal origin: Secondary | ICD-10-CM | POA: Diagnosis not present

## 2021-12-15 DIAGNOSIS — D509 Iron deficiency anemia, unspecified: Secondary | ICD-10-CM | POA: Diagnosis not present

## 2021-12-15 DIAGNOSIS — Z992 Dependence on renal dialysis: Secondary | ICD-10-CM | POA: Diagnosis not present

## 2021-12-15 DIAGNOSIS — D631 Anemia in chronic kidney disease: Secondary | ICD-10-CM | POA: Diagnosis not present

## 2021-12-16 DIAGNOSIS — N185 Chronic kidney disease, stage 5: Secondary | ICD-10-CM | POA: Diagnosis not present

## 2021-12-16 DIAGNOSIS — Z4932 Encounter for adequacy testing for peritoneal dialysis: Secondary | ICD-10-CM | POA: Diagnosis not present

## 2021-12-16 DIAGNOSIS — D631 Anemia in chronic kidney disease: Secondary | ICD-10-CM | POA: Diagnosis not present

## 2021-12-16 DIAGNOSIS — N186 End stage renal disease: Secondary | ICD-10-CM | POA: Diagnosis not present

## 2021-12-16 DIAGNOSIS — Z992 Dependence on renal dialysis: Secondary | ICD-10-CM | POA: Diagnosis not present

## 2021-12-16 DIAGNOSIS — Z23 Encounter for immunization: Secondary | ICD-10-CM | POA: Diagnosis not present

## 2021-12-16 DIAGNOSIS — N2581 Secondary hyperparathyroidism of renal origin: Secondary | ICD-10-CM | POA: Diagnosis not present

## 2021-12-16 DIAGNOSIS — D509 Iron deficiency anemia, unspecified: Secondary | ICD-10-CM | POA: Diagnosis not present

## 2021-12-17 DIAGNOSIS — D631 Anemia in chronic kidney disease: Secondary | ICD-10-CM | POA: Diagnosis not present

## 2021-12-17 DIAGNOSIS — D509 Iron deficiency anemia, unspecified: Secondary | ICD-10-CM | POA: Diagnosis not present

## 2021-12-17 DIAGNOSIS — Z992 Dependence on renal dialysis: Secondary | ICD-10-CM | POA: Diagnosis not present

## 2021-12-17 DIAGNOSIS — N186 End stage renal disease: Secondary | ICD-10-CM | POA: Diagnosis not present

## 2021-12-17 DIAGNOSIS — Z23 Encounter for immunization: Secondary | ICD-10-CM | POA: Diagnosis not present

## 2021-12-17 DIAGNOSIS — N185 Chronic kidney disease, stage 5: Secondary | ICD-10-CM | POA: Diagnosis not present

## 2021-12-17 DIAGNOSIS — Z4932 Encounter for adequacy testing for peritoneal dialysis: Secondary | ICD-10-CM | POA: Diagnosis not present

## 2021-12-17 DIAGNOSIS — N2581 Secondary hyperparathyroidism of renal origin: Secondary | ICD-10-CM | POA: Diagnosis not present

## 2021-12-18 DIAGNOSIS — D631 Anemia in chronic kidney disease: Secondary | ICD-10-CM | POA: Diagnosis not present

## 2021-12-18 DIAGNOSIS — Z4932 Encounter for adequacy testing for peritoneal dialysis: Secondary | ICD-10-CM | POA: Diagnosis not present

## 2021-12-18 DIAGNOSIS — N186 End stage renal disease: Secondary | ICD-10-CM | POA: Diagnosis not present

## 2021-12-18 DIAGNOSIS — N2581 Secondary hyperparathyroidism of renal origin: Secondary | ICD-10-CM | POA: Diagnosis not present

## 2021-12-18 DIAGNOSIS — Z992 Dependence on renal dialysis: Secondary | ICD-10-CM | POA: Diagnosis not present

## 2021-12-18 DIAGNOSIS — Z23 Encounter for immunization: Secondary | ICD-10-CM | POA: Diagnosis not present

## 2021-12-18 DIAGNOSIS — N185 Chronic kidney disease, stage 5: Secondary | ICD-10-CM | POA: Diagnosis not present

## 2021-12-18 DIAGNOSIS — D509 Iron deficiency anemia, unspecified: Secondary | ICD-10-CM | POA: Diagnosis not present

## 2021-12-19 DIAGNOSIS — N185 Chronic kidney disease, stage 5: Secondary | ICD-10-CM | POA: Diagnosis not present

## 2021-12-19 DIAGNOSIS — Z23 Encounter for immunization: Secondary | ICD-10-CM | POA: Diagnosis not present

## 2021-12-19 DIAGNOSIS — Z4932 Encounter for adequacy testing for peritoneal dialysis: Secondary | ICD-10-CM | POA: Diagnosis not present

## 2021-12-19 DIAGNOSIS — N2581 Secondary hyperparathyroidism of renal origin: Secondary | ICD-10-CM | POA: Diagnosis not present

## 2021-12-19 DIAGNOSIS — N186 End stage renal disease: Secondary | ICD-10-CM | POA: Diagnosis not present

## 2021-12-19 DIAGNOSIS — D631 Anemia in chronic kidney disease: Secondary | ICD-10-CM | POA: Diagnosis not present

## 2021-12-19 DIAGNOSIS — D509 Iron deficiency anemia, unspecified: Secondary | ICD-10-CM | POA: Diagnosis not present

## 2021-12-19 DIAGNOSIS — Z992 Dependence on renal dialysis: Secondary | ICD-10-CM | POA: Diagnosis not present

## 2021-12-20 DIAGNOSIS — Z992 Dependence on renal dialysis: Secondary | ICD-10-CM | POA: Diagnosis not present

## 2021-12-20 DIAGNOSIS — D509 Iron deficiency anemia, unspecified: Secondary | ICD-10-CM | POA: Diagnosis not present

## 2021-12-20 DIAGNOSIS — N2581 Secondary hyperparathyroidism of renal origin: Secondary | ICD-10-CM | POA: Diagnosis not present

## 2021-12-20 DIAGNOSIS — N186 End stage renal disease: Secondary | ICD-10-CM | POA: Diagnosis not present

## 2021-12-20 DIAGNOSIS — N185 Chronic kidney disease, stage 5: Secondary | ICD-10-CM | POA: Diagnosis not present

## 2021-12-20 DIAGNOSIS — Z23 Encounter for immunization: Secondary | ICD-10-CM | POA: Diagnosis not present

## 2021-12-20 DIAGNOSIS — Z4932 Encounter for adequacy testing for peritoneal dialysis: Secondary | ICD-10-CM | POA: Diagnosis not present

## 2021-12-20 DIAGNOSIS — D631 Anemia in chronic kidney disease: Secondary | ICD-10-CM | POA: Diagnosis not present

## 2021-12-21 DIAGNOSIS — D509 Iron deficiency anemia, unspecified: Secondary | ICD-10-CM | POA: Diagnosis not present

## 2021-12-21 DIAGNOSIS — N186 End stage renal disease: Secondary | ICD-10-CM | POA: Diagnosis not present

## 2021-12-21 DIAGNOSIS — Z23 Encounter for immunization: Secondary | ICD-10-CM | POA: Diagnosis not present

## 2021-12-21 DIAGNOSIS — D631 Anemia in chronic kidney disease: Secondary | ICD-10-CM | POA: Diagnosis not present

## 2021-12-21 DIAGNOSIS — Z4932 Encounter for adequacy testing for peritoneal dialysis: Secondary | ICD-10-CM | POA: Diagnosis not present

## 2021-12-21 DIAGNOSIS — N2581 Secondary hyperparathyroidism of renal origin: Secondary | ICD-10-CM | POA: Diagnosis not present

## 2021-12-21 DIAGNOSIS — N185 Chronic kidney disease, stage 5: Secondary | ICD-10-CM | POA: Diagnosis not present

## 2021-12-21 DIAGNOSIS — Z992 Dependence on renal dialysis: Secondary | ICD-10-CM | POA: Diagnosis not present

## 2021-12-22 DIAGNOSIS — N185 Chronic kidney disease, stage 5: Secondary | ICD-10-CM | POA: Diagnosis not present

## 2021-12-22 DIAGNOSIS — D509 Iron deficiency anemia, unspecified: Secondary | ICD-10-CM | POA: Diagnosis not present

## 2021-12-22 DIAGNOSIS — N2581 Secondary hyperparathyroidism of renal origin: Secondary | ICD-10-CM | POA: Diagnosis not present

## 2021-12-22 DIAGNOSIS — D631 Anemia in chronic kidney disease: Secondary | ICD-10-CM | POA: Diagnosis not present

## 2021-12-22 DIAGNOSIS — Z992 Dependence on renal dialysis: Secondary | ICD-10-CM | POA: Diagnosis not present

## 2021-12-22 DIAGNOSIS — Z4932 Encounter for adequacy testing for peritoneal dialysis: Secondary | ICD-10-CM | POA: Diagnosis not present

## 2021-12-22 DIAGNOSIS — N186 End stage renal disease: Secondary | ICD-10-CM | POA: Diagnosis not present

## 2021-12-22 DIAGNOSIS — Z23 Encounter for immunization: Secondary | ICD-10-CM | POA: Diagnosis not present

## 2021-12-23 DIAGNOSIS — N2581 Secondary hyperparathyroidism of renal origin: Secondary | ICD-10-CM | POA: Diagnosis not present

## 2021-12-23 DIAGNOSIS — N186 End stage renal disease: Secondary | ICD-10-CM | POA: Diagnosis not present

## 2021-12-23 DIAGNOSIS — Z4932 Encounter for adequacy testing for peritoneal dialysis: Secondary | ICD-10-CM | POA: Diagnosis not present

## 2021-12-23 DIAGNOSIS — N185 Chronic kidney disease, stage 5: Secondary | ICD-10-CM | POA: Diagnosis not present

## 2021-12-23 DIAGNOSIS — D631 Anemia in chronic kidney disease: Secondary | ICD-10-CM | POA: Diagnosis not present

## 2021-12-23 DIAGNOSIS — Z23 Encounter for immunization: Secondary | ICD-10-CM | POA: Diagnosis not present

## 2021-12-23 DIAGNOSIS — Z992 Dependence on renal dialysis: Secondary | ICD-10-CM | POA: Diagnosis not present

## 2021-12-23 DIAGNOSIS — D509 Iron deficiency anemia, unspecified: Secondary | ICD-10-CM | POA: Diagnosis not present

## 2021-12-24 DIAGNOSIS — Z4932 Encounter for adequacy testing for peritoneal dialysis: Secondary | ICD-10-CM | POA: Diagnosis not present

## 2021-12-24 DIAGNOSIS — D631 Anemia in chronic kidney disease: Secondary | ICD-10-CM | POA: Diagnosis not present

## 2021-12-24 DIAGNOSIS — D509 Iron deficiency anemia, unspecified: Secondary | ICD-10-CM | POA: Diagnosis not present

## 2021-12-24 DIAGNOSIS — Z992 Dependence on renal dialysis: Secondary | ICD-10-CM | POA: Diagnosis not present

## 2021-12-24 DIAGNOSIS — N2581 Secondary hyperparathyroidism of renal origin: Secondary | ICD-10-CM | POA: Diagnosis not present

## 2021-12-24 DIAGNOSIS — N186 End stage renal disease: Secondary | ICD-10-CM | POA: Diagnosis not present

## 2021-12-24 DIAGNOSIS — Z23 Encounter for immunization: Secondary | ICD-10-CM | POA: Diagnosis not present

## 2021-12-24 DIAGNOSIS — N185 Chronic kidney disease, stage 5: Secondary | ICD-10-CM | POA: Diagnosis not present

## 2021-12-25 DIAGNOSIS — D631 Anemia in chronic kidney disease: Secondary | ICD-10-CM | POA: Diagnosis not present

## 2021-12-25 DIAGNOSIS — N185 Chronic kidney disease, stage 5: Secondary | ICD-10-CM | POA: Diagnosis not present

## 2021-12-25 DIAGNOSIS — Z4932 Encounter for adequacy testing for peritoneal dialysis: Secondary | ICD-10-CM | POA: Diagnosis not present

## 2021-12-25 DIAGNOSIS — D509 Iron deficiency anemia, unspecified: Secondary | ICD-10-CM | POA: Diagnosis not present

## 2021-12-25 DIAGNOSIS — Z992 Dependence on renal dialysis: Secondary | ICD-10-CM | POA: Diagnosis not present

## 2021-12-25 DIAGNOSIS — N2581 Secondary hyperparathyroidism of renal origin: Secondary | ICD-10-CM | POA: Diagnosis not present

## 2021-12-25 DIAGNOSIS — N186 End stage renal disease: Secondary | ICD-10-CM | POA: Diagnosis not present

## 2021-12-25 DIAGNOSIS — Z23 Encounter for immunization: Secondary | ICD-10-CM | POA: Diagnosis not present

## 2021-12-26 DIAGNOSIS — N2581 Secondary hyperparathyroidism of renal origin: Secondary | ICD-10-CM | POA: Diagnosis not present

## 2021-12-26 DIAGNOSIS — Z23 Encounter for immunization: Secondary | ICD-10-CM | POA: Diagnosis not present

## 2021-12-26 DIAGNOSIS — D631 Anemia in chronic kidney disease: Secondary | ICD-10-CM | POA: Diagnosis not present

## 2021-12-26 DIAGNOSIS — N185 Chronic kidney disease, stage 5: Secondary | ICD-10-CM | POA: Diagnosis not present

## 2021-12-26 DIAGNOSIS — Z992 Dependence on renal dialysis: Secondary | ICD-10-CM | POA: Diagnosis not present

## 2021-12-26 DIAGNOSIS — D509 Iron deficiency anemia, unspecified: Secondary | ICD-10-CM | POA: Diagnosis not present

## 2021-12-26 DIAGNOSIS — Z4932 Encounter for adequacy testing for peritoneal dialysis: Secondary | ICD-10-CM | POA: Diagnosis not present

## 2021-12-26 DIAGNOSIS — N186 End stage renal disease: Secondary | ICD-10-CM | POA: Diagnosis not present

## 2021-12-27 DIAGNOSIS — Z4932 Encounter for adequacy testing for peritoneal dialysis: Secondary | ICD-10-CM | POA: Diagnosis not present

## 2021-12-27 DIAGNOSIS — N186 End stage renal disease: Secondary | ICD-10-CM | POA: Diagnosis not present

## 2021-12-27 DIAGNOSIS — D631 Anemia in chronic kidney disease: Secondary | ICD-10-CM | POA: Diagnosis not present

## 2021-12-27 DIAGNOSIS — Z23 Encounter for immunization: Secondary | ICD-10-CM | POA: Diagnosis not present

## 2021-12-27 DIAGNOSIS — Z992 Dependence on renal dialysis: Secondary | ICD-10-CM | POA: Diagnosis not present

## 2021-12-27 DIAGNOSIS — D509 Iron deficiency anemia, unspecified: Secondary | ICD-10-CM | POA: Diagnosis not present

## 2021-12-27 DIAGNOSIS — N2581 Secondary hyperparathyroidism of renal origin: Secondary | ICD-10-CM | POA: Diagnosis not present

## 2021-12-27 DIAGNOSIS — N185 Chronic kidney disease, stage 5: Secondary | ICD-10-CM | POA: Diagnosis not present

## 2021-12-28 DIAGNOSIS — D631 Anemia in chronic kidney disease: Secondary | ICD-10-CM | POA: Diagnosis not present

## 2021-12-28 DIAGNOSIS — D509 Iron deficiency anemia, unspecified: Secondary | ICD-10-CM | POA: Diagnosis not present

## 2021-12-28 DIAGNOSIS — N2581 Secondary hyperparathyroidism of renal origin: Secondary | ICD-10-CM | POA: Diagnosis not present

## 2021-12-28 DIAGNOSIS — N186 End stage renal disease: Secondary | ICD-10-CM | POA: Diagnosis not present

## 2021-12-28 DIAGNOSIS — Z992 Dependence on renal dialysis: Secondary | ICD-10-CM | POA: Diagnosis not present

## 2021-12-28 DIAGNOSIS — Z4932 Encounter for adequacy testing for peritoneal dialysis: Secondary | ICD-10-CM | POA: Diagnosis not present

## 2021-12-28 DIAGNOSIS — N185 Chronic kidney disease, stage 5: Secondary | ICD-10-CM | POA: Diagnosis not present

## 2021-12-28 DIAGNOSIS — Z23 Encounter for immunization: Secondary | ICD-10-CM | POA: Diagnosis not present

## 2021-12-29 DIAGNOSIS — N2581 Secondary hyperparathyroidism of renal origin: Secondary | ICD-10-CM | POA: Diagnosis not present

## 2021-12-29 DIAGNOSIS — Z4932 Encounter for adequacy testing for peritoneal dialysis: Secondary | ICD-10-CM | POA: Diagnosis not present

## 2021-12-29 DIAGNOSIS — D509 Iron deficiency anemia, unspecified: Secondary | ICD-10-CM | POA: Diagnosis not present

## 2021-12-29 DIAGNOSIS — Z23 Encounter for immunization: Secondary | ICD-10-CM | POA: Diagnosis not present

## 2021-12-29 DIAGNOSIS — Z992 Dependence on renal dialysis: Secondary | ICD-10-CM | POA: Diagnosis not present

## 2021-12-29 DIAGNOSIS — N186 End stage renal disease: Secondary | ICD-10-CM | POA: Diagnosis not present

## 2021-12-29 DIAGNOSIS — N185 Chronic kidney disease, stage 5: Secondary | ICD-10-CM | POA: Diagnosis not present

## 2021-12-29 DIAGNOSIS — D631 Anemia in chronic kidney disease: Secondary | ICD-10-CM | POA: Diagnosis not present

## 2021-12-30 DIAGNOSIS — D509 Iron deficiency anemia, unspecified: Secondary | ICD-10-CM | POA: Diagnosis not present

## 2021-12-30 DIAGNOSIS — Z23 Encounter for immunization: Secondary | ICD-10-CM | POA: Diagnosis not present

## 2021-12-30 DIAGNOSIS — N2581 Secondary hyperparathyroidism of renal origin: Secondary | ICD-10-CM | POA: Diagnosis not present

## 2021-12-30 DIAGNOSIS — D631 Anemia in chronic kidney disease: Secondary | ICD-10-CM | POA: Diagnosis not present

## 2021-12-30 DIAGNOSIS — Z4932 Encounter for adequacy testing for peritoneal dialysis: Secondary | ICD-10-CM | POA: Diagnosis not present

## 2021-12-30 DIAGNOSIS — Z992 Dependence on renal dialysis: Secondary | ICD-10-CM | POA: Diagnosis not present

## 2021-12-30 DIAGNOSIS — N186 End stage renal disease: Secondary | ICD-10-CM | POA: Diagnosis not present

## 2021-12-30 DIAGNOSIS — N185 Chronic kidney disease, stage 5: Secondary | ICD-10-CM | POA: Diagnosis not present

## 2021-12-31 DIAGNOSIS — D631 Anemia in chronic kidney disease: Secondary | ICD-10-CM | POA: Diagnosis not present

## 2021-12-31 DIAGNOSIS — D509 Iron deficiency anemia, unspecified: Secondary | ICD-10-CM | POA: Diagnosis not present

## 2021-12-31 DIAGNOSIS — Z23 Encounter for immunization: Secondary | ICD-10-CM | POA: Diagnosis not present

## 2021-12-31 DIAGNOSIS — Z4932 Encounter for adequacy testing for peritoneal dialysis: Secondary | ICD-10-CM | POA: Diagnosis not present

## 2021-12-31 DIAGNOSIS — Z992 Dependence on renal dialysis: Secondary | ICD-10-CM | POA: Diagnosis not present

## 2021-12-31 DIAGNOSIS — N186 End stage renal disease: Secondary | ICD-10-CM | POA: Diagnosis not present

## 2021-12-31 DIAGNOSIS — N185 Chronic kidney disease, stage 5: Secondary | ICD-10-CM | POA: Diagnosis not present

## 2021-12-31 DIAGNOSIS — N2581 Secondary hyperparathyroidism of renal origin: Secondary | ICD-10-CM | POA: Diagnosis not present

## 2022-01-01 DIAGNOSIS — Z4932 Encounter for adequacy testing for peritoneal dialysis: Secondary | ICD-10-CM | POA: Diagnosis not present

## 2022-01-01 DIAGNOSIS — Z992 Dependence on renal dialysis: Secondary | ICD-10-CM | POA: Diagnosis not present

## 2022-01-01 DIAGNOSIS — N185 Chronic kidney disease, stage 5: Secondary | ICD-10-CM | POA: Diagnosis not present

## 2022-01-01 DIAGNOSIS — N2581 Secondary hyperparathyroidism of renal origin: Secondary | ICD-10-CM | POA: Diagnosis not present

## 2022-01-01 DIAGNOSIS — N186 End stage renal disease: Secondary | ICD-10-CM | POA: Diagnosis not present

## 2022-01-01 DIAGNOSIS — D509 Iron deficiency anemia, unspecified: Secondary | ICD-10-CM | POA: Diagnosis not present

## 2022-01-01 DIAGNOSIS — D631 Anemia in chronic kidney disease: Secondary | ICD-10-CM | POA: Diagnosis not present

## 2022-01-01 DIAGNOSIS — Z23 Encounter for immunization: Secondary | ICD-10-CM | POA: Diagnosis not present

## 2022-01-02 DIAGNOSIS — N185 Chronic kidney disease, stage 5: Secondary | ICD-10-CM | POA: Diagnosis not present

## 2022-01-02 DIAGNOSIS — N2581 Secondary hyperparathyroidism of renal origin: Secondary | ICD-10-CM | POA: Diagnosis not present

## 2022-01-02 DIAGNOSIS — N186 End stage renal disease: Secondary | ICD-10-CM | POA: Diagnosis not present

## 2022-01-02 DIAGNOSIS — D509 Iron deficiency anemia, unspecified: Secondary | ICD-10-CM | POA: Diagnosis not present

## 2022-01-02 DIAGNOSIS — Z23 Encounter for immunization: Secondary | ICD-10-CM | POA: Diagnosis not present

## 2022-01-02 DIAGNOSIS — D631 Anemia in chronic kidney disease: Secondary | ICD-10-CM | POA: Diagnosis not present

## 2022-01-02 DIAGNOSIS — Z4932 Encounter for adequacy testing for peritoneal dialysis: Secondary | ICD-10-CM | POA: Diagnosis not present

## 2022-01-02 DIAGNOSIS — Z992 Dependence on renal dialysis: Secondary | ICD-10-CM | POA: Diagnosis not present

## 2022-01-03 DIAGNOSIS — Z4932 Encounter for adequacy testing for peritoneal dialysis: Secondary | ICD-10-CM | POA: Diagnosis not present

## 2022-01-03 DIAGNOSIS — D509 Iron deficiency anemia, unspecified: Secondary | ICD-10-CM | POA: Diagnosis not present

## 2022-01-03 DIAGNOSIS — N2581 Secondary hyperparathyroidism of renal origin: Secondary | ICD-10-CM | POA: Diagnosis not present

## 2022-01-03 DIAGNOSIS — N185 Chronic kidney disease, stage 5: Secondary | ICD-10-CM | POA: Diagnosis not present

## 2022-01-03 DIAGNOSIS — Z23 Encounter for immunization: Secondary | ICD-10-CM | POA: Diagnosis not present

## 2022-01-03 DIAGNOSIS — N186 End stage renal disease: Secondary | ICD-10-CM | POA: Diagnosis not present

## 2022-01-03 DIAGNOSIS — D631 Anemia in chronic kidney disease: Secondary | ICD-10-CM | POA: Diagnosis not present

## 2022-01-03 DIAGNOSIS — Z992 Dependence on renal dialysis: Secondary | ICD-10-CM | POA: Diagnosis not present

## 2022-01-04 DIAGNOSIS — Z23 Encounter for immunization: Secondary | ICD-10-CM | POA: Diagnosis not present

## 2022-01-04 DIAGNOSIS — D509 Iron deficiency anemia, unspecified: Secondary | ICD-10-CM | POA: Diagnosis not present

## 2022-01-04 DIAGNOSIS — N186 End stage renal disease: Secondary | ICD-10-CM | POA: Diagnosis not present

## 2022-01-04 DIAGNOSIS — N185 Chronic kidney disease, stage 5: Secondary | ICD-10-CM | POA: Diagnosis not present

## 2022-01-04 DIAGNOSIS — Z4932 Encounter for adequacy testing for peritoneal dialysis: Secondary | ICD-10-CM | POA: Diagnosis not present

## 2022-01-04 DIAGNOSIS — D631 Anemia in chronic kidney disease: Secondary | ICD-10-CM | POA: Diagnosis not present

## 2022-01-04 DIAGNOSIS — Z992 Dependence on renal dialysis: Secondary | ICD-10-CM | POA: Diagnosis not present

## 2022-01-04 DIAGNOSIS — N2581 Secondary hyperparathyroidism of renal origin: Secondary | ICD-10-CM | POA: Diagnosis not present

## 2022-01-05 DIAGNOSIS — Z23 Encounter for immunization: Secondary | ICD-10-CM | POA: Diagnosis not present

## 2022-01-05 DIAGNOSIS — Z4932 Encounter for adequacy testing for peritoneal dialysis: Secondary | ICD-10-CM | POA: Diagnosis not present

## 2022-01-05 DIAGNOSIS — Z992 Dependence on renal dialysis: Secondary | ICD-10-CM | POA: Diagnosis not present

## 2022-01-05 DIAGNOSIS — N2581 Secondary hyperparathyroidism of renal origin: Secondary | ICD-10-CM | POA: Diagnosis not present

## 2022-01-05 DIAGNOSIS — D631 Anemia in chronic kidney disease: Secondary | ICD-10-CM | POA: Diagnosis not present

## 2022-01-05 DIAGNOSIS — N186 End stage renal disease: Secondary | ICD-10-CM | POA: Diagnosis not present

## 2022-01-05 DIAGNOSIS — D509 Iron deficiency anemia, unspecified: Secondary | ICD-10-CM | POA: Diagnosis not present

## 2022-01-05 DIAGNOSIS — N185 Chronic kidney disease, stage 5: Secondary | ICD-10-CM | POA: Diagnosis not present

## 2022-01-06 DIAGNOSIS — Z992 Dependence on renal dialysis: Secondary | ICD-10-CM | POA: Diagnosis not present

## 2022-01-06 DIAGNOSIS — Z4932 Encounter for adequacy testing for peritoneal dialysis: Secondary | ICD-10-CM | POA: Diagnosis not present

## 2022-01-06 DIAGNOSIS — D631 Anemia in chronic kidney disease: Secondary | ICD-10-CM | POA: Diagnosis not present

## 2022-01-06 DIAGNOSIS — N2581 Secondary hyperparathyroidism of renal origin: Secondary | ICD-10-CM | POA: Diagnosis not present

## 2022-01-06 DIAGNOSIS — N185 Chronic kidney disease, stage 5: Secondary | ICD-10-CM | POA: Diagnosis not present

## 2022-01-06 DIAGNOSIS — D509 Iron deficiency anemia, unspecified: Secondary | ICD-10-CM | POA: Diagnosis not present

## 2022-01-06 DIAGNOSIS — Z23 Encounter for immunization: Secondary | ICD-10-CM | POA: Diagnosis not present

## 2022-01-06 DIAGNOSIS — N186 End stage renal disease: Secondary | ICD-10-CM | POA: Diagnosis not present

## 2022-01-07 DIAGNOSIS — D509 Iron deficiency anemia, unspecified: Secondary | ICD-10-CM | POA: Diagnosis not present

## 2022-01-07 DIAGNOSIS — N185 Chronic kidney disease, stage 5: Secondary | ICD-10-CM | POA: Diagnosis not present

## 2022-01-07 DIAGNOSIS — D631 Anemia in chronic kidney disease: Secondary | ICD-10-CM | POA: Diagnosis not present

## 2022-01-07 DIAGNOSIS — Z992 Dependence on renal dialysis: Secondary | ICD-10-CM | POA: Diagnosis not present

## 2022-01-07 DIAGNOSIS — Z4932 Encounter for adequacy testing for peritoneal dialysis: Secondary | ICD-10-CM | POA: Diagnosis not present

## 2022-01-07 DIAGNOSIS — N186 End stage renal disease: Secondary | ICD-10-CM | POA: Diagnosis not present

## 2022-01-07 DIAGNOSIS — Z23 Encounter for immunization: Secondary | ICD-10-CM | POA: Diagnosis not present

## 2022-01-07 DIAGNOSIS — N2581 Secondary hyperparathyroidism of renal origin: Secondary | ICD-10-CM | POA: Diagnosis not present

## 2022-01-08 DIAGNOSIS — N185 Chronic kidney disease, stage 5: Secondary | ICD-10-CM | POA: Diagnosis not present

## 2022-01-08 DIAGNOSIS — D631 Anemia in chronic kidney disease: Secondary | ICD-10-CM | POA: Diagnosis not present

## 2022-01-08 DIAGNOSIS — N186 End stage renal disease: Secondary | ICD-10-CM | POA: Diagnosis not present

## 2022-01-08 DIAGNOSIS — D509 Iron deficiency anemia, unspecified: Secondary | ICD-10-CM | POA: Diagnosis not present

## 2022-01-08 DIAGNOSIS — Z4932 Encounter for adequacy testing for peritoneal dialysis: Secondary | ICD-10-CM | POA: Diagnosis not present

## 2022-01-08 DIAGNOSIS — Z992 Dependence on renal dialysis: Secondary | ICD-10-CM | POA: Diagnosis not present

## 2022-01-08 DIAGNOSIS — Z23 Encounter for immunization: Secondary | ICD-10-CM | POA: Diagnosis not present

## 2022-01-08 DIAGNOSIS — N2581 Secondary hyperparathyroidism of renal origin: Secondary | ICD-10-CM | POA: Diagnosis not present

## 2022-01-09 DIAGNOSIS — N2581 Secondary hyperparathyroidism of renal origin: Secondary | ICD-10-CM | POA: Diagnosis not present

## 2022-01-09 DIAGNOSIS — Z992 Dependence on renal dialysis: Secondary | ICD-10-CM | POA: Diagnosis not present

## 2022-01-09 DIAGNOSIS — Z4932 Encounter for adequacy testing for peritoneal dialysis: Secondary | ICD-10-CM | POA: Diagnosis not present

## 2022-01-09 DIAGNOSIS — N186 End stage renal disease: Secondary | ICD-10-CM | POA: Diagnosis not present

## 2022-01-09 DIAGNOSIS — Z23 Encounter for immunization: Secondary | ICD-10-CM | POA: Diagnosis not present

## 2022-01-09 DIAGNOSIS — N185 Chronic kidney disease, stage 5: Secondary | ICD-10-CM | POA: Diagnosis not present

## 2022-01-09 DIAGNOSIS — D631 Anemia in chronic kidney disease: Secondary | ICD-10-CM | POA: Diagnosis not present

## 2022-01-09 DIAGNOSIS — D509 Iron deficiency anemia, unspecified: Secondary | ICD-10-CM | POA: Diagnosis not present

## 2022-01-09 DIAGNOSIS — I129 Hypertensive chronic kidney disease with stage 1 through stage 4 chronic kidney disease, or unspecified chronic kidney disease: Secondary | ICD-10-CM | POA: Diagnosis not present

## 2022-01-10 DIAGNOSIS — Z4932 Encounter for adequacy testing for peritoneal dialysis: Secondary | ICD-10-CM | POA: Diagnosis not present

## 2022-01-10 DIAGNOSIS — D631 Anemia in chronic kidney disease: Secondary | ICD-10-CM | POA: Diagnosis not present

## 2022-01-10 DIAGNOSIS — N2581 Secondary hyperparathyroidism of renal origin: Secondary | ICD-10-CM | POA: Diagnosis not present

## 2022-01-10 DIAGNOSIS — Z992 Dependence on renal dialysis: Secondary | ICD-10-CM | POA: Diagnosis not present

## 2022-01-10 DIAGNOSIS — N186 End stage renal disease: Secondary | ICD-10-CM | POA: Diagnosis not present

## 2022-01-10 DIAGNOSIS — D509 Iron deficiency anemia, unspecified: Secondary | ICD-10-CM | POA: Diagnosis not present

## 2022-01-11 DIAGNOSIS — D509 Iron deficiency anemia, unspecified: Secondary | ICD-10-CM | POA: Diagnosis not present

## 2022-01-11 DIAGNOSIS — D631 Anemia in chronic kidney disease: Secondary | ICD-10-CM | POA: Diagnosis not present

## 2022-01-11 DIAGNOSIS — N186 End stage renal disease: Secondary | ICD-10-CM | POA: Diagnosis not present

## 2022-01-11 DIAGNOSIS — Z4932 Encounter for adequacy testing for peritoneal dialysis: Secondary | ICD-10-CM | POA: Diagnosis not present

## 2022-01-11 DIAGNOSIS — N2581 Secondary hyperparathyroidism of renal origin: Secondary | ICD-10-CM | POA: Diagnosis not present

## 2022-01-11 DIAGNOSIS — Z992 Dependence on renal dialysis: Secondary | ICD-10-CM | POA: Diagnosis not present

## 2022-01-12 DIAGNOSIS — N186 End stage renal disease: Secondary | ICD-10-CM | POA: Diagnosis not present

## 2022-01-12 DIAGNOSIS — D631 Anemia in chronic kidney disease: Secondary | ICD-10-CM | POA: Diagnosis not present

## 2022-01-12 DIAGNOSIS — D509 Iron deficiency anemia, unspecified: Secondary | ICD-10-CM | POA: Diagnosis not present

## 2022-01-12 DIAGNOSIS — N2581 Secondary hyperparathyroidism of renal origin: Secondary | ICD-10-CM | POA: Diagnosis not present

## 2022-01-12 DIAGNOSIS — Z992 Dependence on renal dialysis: Secondary | ICD-10-CM | POA: Diagnosis not present

## 2022-01-12 DIAGNOSIS — Z4932 Encounter for adequacy testing for peritoneal dialysis: Secondary | ICD-10-CM | POA: Diagnosis not present

## 2022-01-13 DIAGNOSIS — N186 End stage renal disease: Secondary | ICD-10-CM | POA: Diagnosis not present

## 2022-01-13 DIAGNOSIS — D509 Iron deficiency anemia, unspecified: Secondary | ICD-10-CM | POA: Diagnosis not present

## 2022-01-13 DIAGNOSIS — Z4932 Encounter for adequacy testing for peritoneal dialysis: Secondary | ICD-10-CM | POA: Diagnosis not present

## 2022-01-13 DIAGNOSIS — D631 Anemia in chronic kidney disease: Secondary | ICD-10-CM | POA: Diagnosis not present

## 2022-01-13 DIAGNOSIS — Z992 Dependence on renal dialysis: Secondary | ICD-10-CM | POA: Diagnosis not present

## 2022-01-13 DIAGNOSIS — N2581 Secondary hyperparathyroidism of renal origin: Secondary | ICD-10-CM | POA: Diagnosis not present

## 2022-01-14 DIAGNOSIS — D509 Iron deficiency anemia, unspecified: Secondary | ICD-10-CM | POA: Diagnosis not present

## 2022-01-14 DIAGNOSIS — Z4932 Encounter for adequacy testing for peritoneal dialysis: Secondary | ICD-10-CM | POA: Diagnosis not present

## 2022-01-14 DIAGNOSIS — D631 Anemia in chronic kidney disease: Secondary | ICD-10-CM | POA: Diagnosis not present

## 2022-01-14 DIAGNOSIS — Z992 Dependence on renal dialysis: Secondary | ICD-10-CM | POA: Diagnosis not present

## 2022-01-14 DIAGNOSIS — N186 End stage renal disease: Secondary | ICD-10-CM | POA: Diagnosis not present

## 2022-01-14 DIAGNOSIS — N2581 Secondary hyperparathyroidism of renal origin: Secondary | ICD-10-CM | POA: Diagnosis not present

## 2022-01-15 DIAGNOSIS — D509 Iron deficiency anemia, unspecified: Secondary | ICD-10-CM | POA: Diagnosis not present

## 2022-01-15 DIAGNOSIS — D631 Anemia in chronic kidney disease: Secondary | ICD-10-CM | POA: Diagnosis not present

## 2022-01-15 DIAGNOSIS — N2581 Secondary hyperparathyroidism of renal origin: Secondary | ICD-10-CM | POA: Diagnosis not present

## 2022-01-15 DIAGNOSIS — N186 End stage renal disease: Secondary | ICD-10-CM | POA: Diagnosis not present

## 2022-01-15 DIAGNOSIS — Z992 Dependence on renal dialysis: Secondary | ICD-10-CM | POA: Diagnosis not present

## 2022-01-15 DIAGNOSIS — Z4932 Encounter for adequacy testing for peritoneal dialysis: Secondary | ICD-10-CM | POA: Diagnosis not present

## 2022-01-16 DIAGNOSIS — D631 Anemia in chronic kidney disease: Secondary | ICD-10-CM | POA: Diagnosis not present

## 2022-01-16 DIAGNOSIS — D509 Iron deficiency anemia, unspecified: Secondary | ICD-10-CM | POA: Diagnosis not present

## 2022-01-16 DIAGNOSIS — Z4932 Encounter for adequacy testing for peritoneal dialysis: Secondary | ICD-10-CM | POA: Diagnosis not present

## 2022-01-16 DIAGNOSIS — N186 End stage renal disease: Secondary | ICD-10-CM | POA: Diagnosis not present

## 2022-01-16 DIAGNOSIS — Z992 Dependence on renal dialysis: Secondary | ICD-10-CM | POA: Diagnosis not present

## 2022-01-16 DIAGNOSIS — N2581 Secondary hyperparathyroidism of renal origin: Secondary | ICD-10-CM | POA: Diagnosis not present

## 2022-01-17 DIAGNOSIS — D631 Anemia in chronic kidney disease: Secondary | ICD-10-CM | POA: Diagnosis not present

## 2022-01-17 DIAGNOSIS — Z992 Dependence on renal dialysis: Secondary | ICD-10-CM | POA: Diagnosis not present

## 2022-01-17 DIAGNOSIS — N2581 Secondary hyperparathyroidism of renal origin: Secondary | ICD-10-CM | POA: Diagnosis not present

## 2022-01-17 DIAGNOSIS — N186 End stage renal disease: Secondary | ICD-10-CM | POA: Diagnosis not present

## 2022-01-17 DIAGNOSIS — D509 Iron deficiency anemia, unspecified: Secondary | ICD-10-CM | POA: Diagnosis not present

## 2022-01-17 DIAGNOSIS — Z4932 Encounter for adequacy testing for peritoneal dialysis: Secondary | ICD-10-CM | POA: Diagnosis not present

## 2022-01-18 DIAGNOSIS — Z4932 Encounter for adequacy testing for peritoneal dialysis: Secondary | ICD-10-CM | POA: Diagnosis not present

## 2022-01-18 DIAGNOSIS — D509 Iron deficiency anemia, unspecified: Secondary | ICD-10-CM | POA: Diagnosis not present

## 2022-01-18 DIAGNOSIS — D631 Anemia in chronic kidney disease: Secondary | ICD-10-CM | POA: Diagnosis not present

## 2022-01-18 DIAGNOSIS — N2581 Secondary hyperparathyroidism of renal origin: Secondary | ICD-10-CM | POA: Diagnosis not present

## 2022-01-18 DIAGNOSIS — N186 End stage renal disease: Secondary | ICD-10-CM | POA: Diagnosis not present

## 2022-01-18 DIAGNOSIS — Z992 Dependence on renal dialysis: Secondary | ICD-10-CM | POA: Diagnosis not present

## 2022-01-19 DIAGNOSIS — Z4932 Encounter for adequacy testing for peritoneal dialysis: Secondary | ICD-10-CM | POA: Diagnosis not present

## 2022-01-19 DIAGNOSIS — D509 Iron deficiency anemia, unspecified: Secondary | ICD-10-CM | POA: Diagnosis not present

## 2022-01-19 DIAGNOSIS — N2581 Secondary hyperparathyroidism of renal origin: Secondary | ICD-10-CM | POA: Diagnosis not present

## 2022-01-19 DIAGNOSIS — D631 Anemia in chronic kidney disease: Secondary | ICD-10-CM | POA: Diagnosis not present

## 2022-01-19 DIAGNOSIS — Z992 Dependence on renal dialysis: Secondary | ICD-10-CM | POA: Diagnosis not present

## 2022-01-19 DIAGNOSIS — N186 End stage renal disease: Secondary | ICD-10-CM | POA: Diagnosis not present

## 2022-01-20 DIAGNOSIS — D509 Iron deficiency anemia, unspecified: Secondary | ICD-10-CM | POA: Diagnosis not present

## 2022-01-20 DIAGNOSIS — Z4932 Encounter for adequacy testing for peritoneal dialysis: Secondary | ICD-10-CM | POA: Diagnosis not present

## 2022-01-20 DIAGNOSIS — N186 End stage renal disease: Secondary | ICD-10-CM | POA: Diagnosis not present

## 2022-01-20 DIAGNOSIS — N2581 Secondary hyperparathyroidism of renal origin: Secondary | ICD-10-CM | POA: Diagnosis not present

## 2022-01-20 DIAGNOSIS — D631 Anemia in chronic kidney disease: Secondary | ICD-10-CM | POA: Diagnosis not present

## 2022-01-20 DIAGNOSIS — Z992 Dependence on renal dialysis: Secondary | ICD-10-CM | POA: Diagnosis not present

## 2022-01-21 DIAGNOSIS — N186 End stage renal disease: Secondary | ICD-10-CM | POA: Diagnosis not present

## 2022-01-21 DIAGNOSIS — N2581 Secondary hyperparathyroidism of renal origin: Secondary | ICD-10-CM | POA: Diagnosis not present

## 2022-01-21 DIAGNOSIS — Z4932 Encounter for adequacy testing for peritoneal dialysis: Secondary | ICD-10-CM | POA: Diagnosis not present

## 2022-01-21 DIAGNOSIS — Z992 Dependence on renal dialysis: Secondary | ICD-10-CM | POA: Diagnosis not present

## 2022-01-21 DIAGNOSIS — D631 Anemia in chronic kidney disease: Secondary | ICD-10-CM | POA: Diagnosis not present

## 2022-01-21 DIAGNOSIS — D509 Iron deficiency anemia, unspecified: Secondary | ICD-10-CM | POA: Diagnosis not present

## 2022-01-22 DIAGNOSIS — D509 Iron deficiency anemia, unspecified: Secondary | ICD-10-CM | POA: Diagnosis not present

## 2022-01-22 DIAGNOSIS — Z4932 Encounter for adequacy testing for peritoneal dialysis: Secondary | ICD-10-CM | POA: Diagnosis not present

## 2022-01-22 DIAGNOSIS — Z992 Dependence on renal dialysis: Secondary | ICD-10-CM | POA: Diagnosis not present

## 2022-01-22 DIAGNOSIS — D631 Anemia in chronic kidney disease: Secondary | ICD-10-CM | POA: Diagnosis not present

## 2022-01-22 DIAGNOSIS — N2581 Secondary hyperparathyroidism of renal origin: Secondary | ICD-10-CM | POA: Diagnosis not present

## 2022-01-22 DIAGNOSIS — N186 End stage renal disease: Secondary | ICD-10-CM | POA: Diagnosis not present

## 2022-01-23 DIAGNOSIS — N2581 Secondary hyperparathyroidism of renal origin: Secondary | ICD-10-CM | POA: Diagnosis not present

## 2022-01-23 DIAGNOSIS — D509 Iron deficiency anemia, unspecified: Secondary | ICD-10-CM | POA: Diagnosis not present

## 2022-01-23 DIAGNOSIS — N186 End stage renal disease: Secondary | ICD-10-CM | POA: Diagnosis not present

## 2022-01-23 DIAGNOSIS — D631 Anemia in chronic kidney disease: Secondary | ICD-10-CM | POA: Diagnosis not present

## 2022-01-23 DIAGNOSIS — Z4932 Encounter for adequacy testing for peritoneal dialysis: Secondary | ICD-10-CM | POA: Diagnosis not present

## 2022-01-23 DIAGNOSIS — Z992 Dependence on renal dialysis: Secondary | ICD-10-CM | POA: Diagnosis not present

## 2022-01-23 IMAGING — CT CT HEAD W/O CM
4 series · 16 of 47 positions shown, 18 images · non-contrast
Comparison: None.

CLINICAL DATA: Dialysis patient with dizziness.

EXAM:
CT HEAD WITHOUT CONTRAST
TECHNIQUE: Contiguous axial images were obtained from the base of the skull
through the vertex without intravenous contrast.

[Series 3: head wo · axial · 0.45mm/px · z∈[-138,-23]mm · 7 of 31 slices shown, 9 images]
[im 4/31  brain]
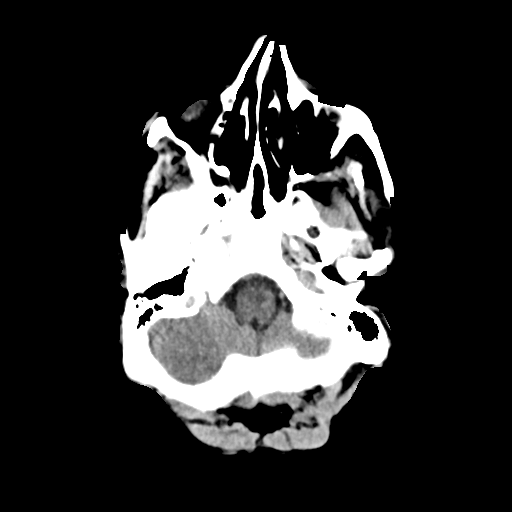
[im 4/31  bone]
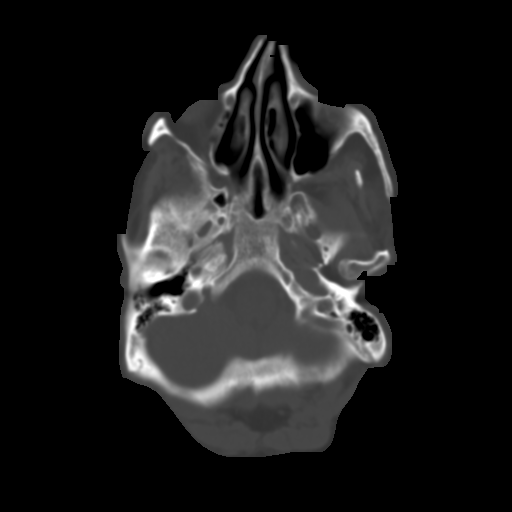
[im 8/31  brain]
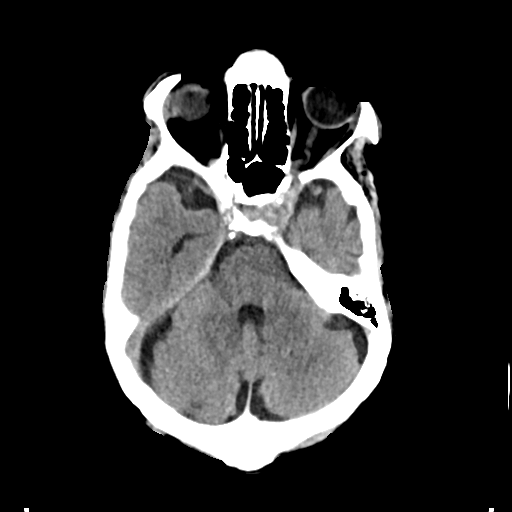
[im 12/31  brain]
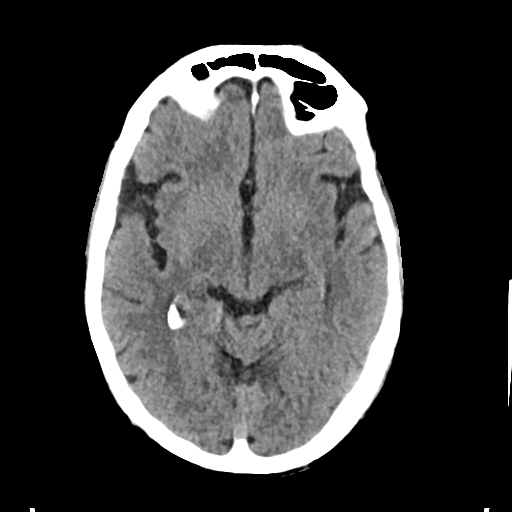
[im 16/31  brain]
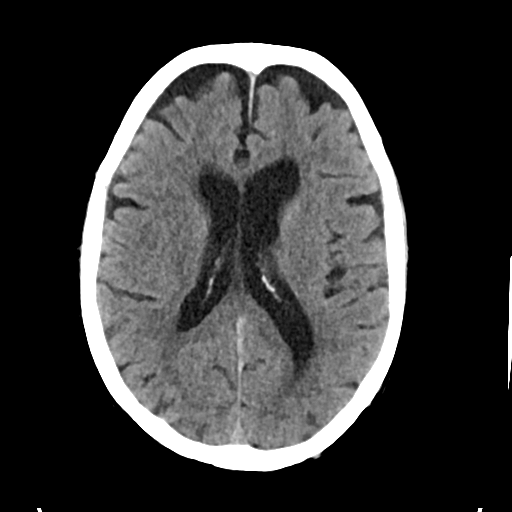
[im 19/31  brain]
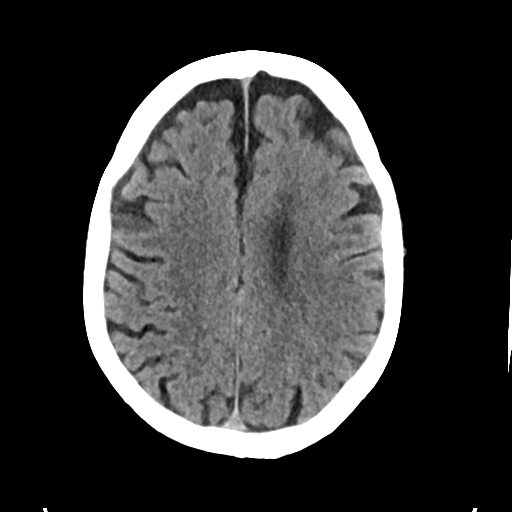
[im 19/31  bone]
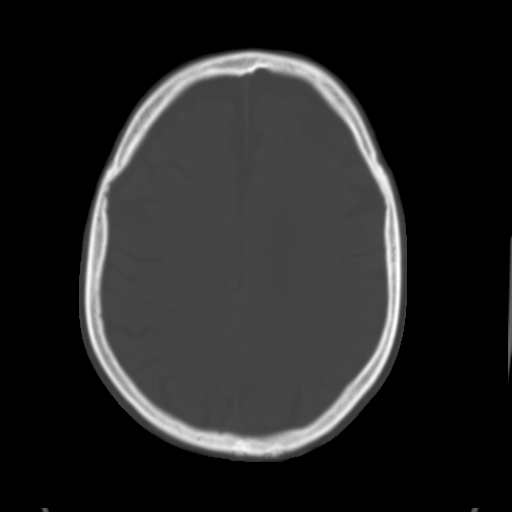
[im 23/31  brain]
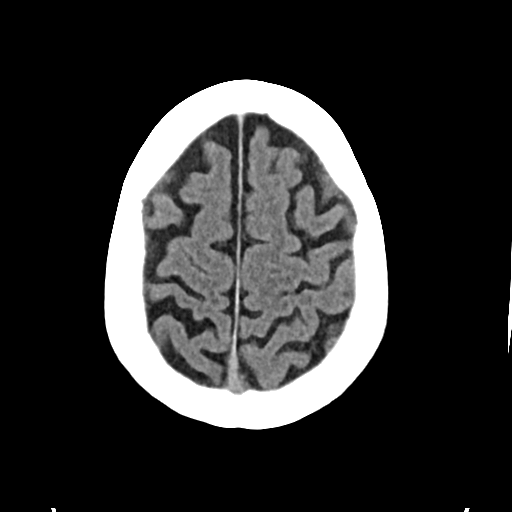
[im 27/31  brain]
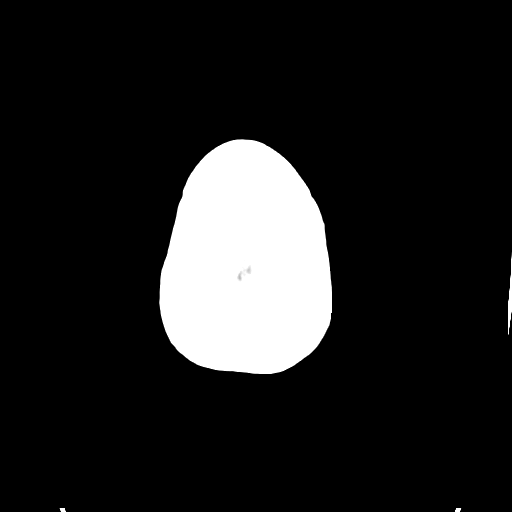

[Series 4: head bone · axial · 0.45mm/px · z∈[-139,-107]mm · 3 of 78 slices shown]
[im 8/78  bone]
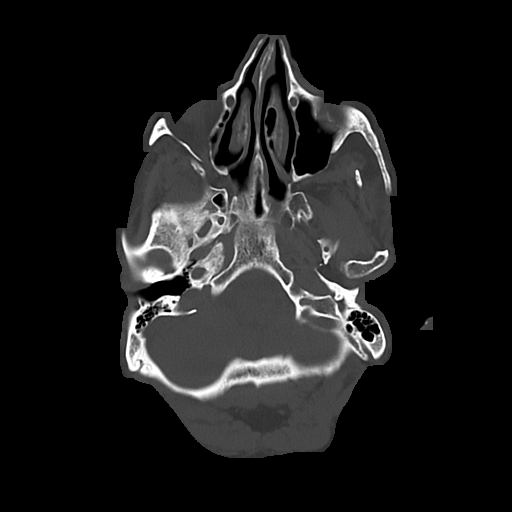
[im 16/78  bone]
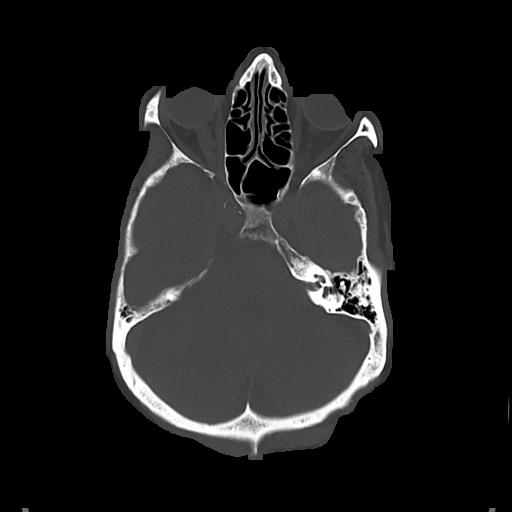
[im 24/78  bone]
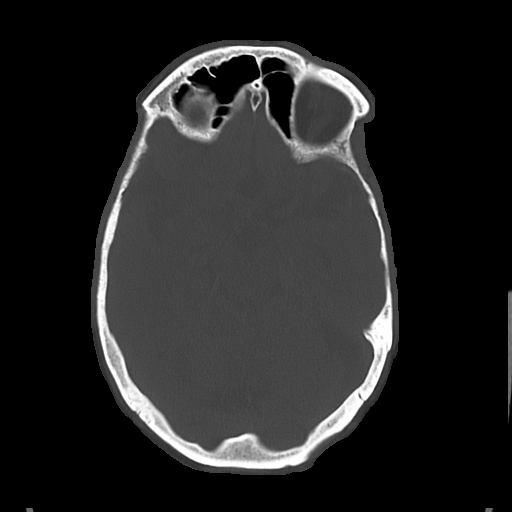

[Series 5: cor soft · coronal · 0.33mm/px · 3 of 70 slices shown]
[im 24/70  brain]
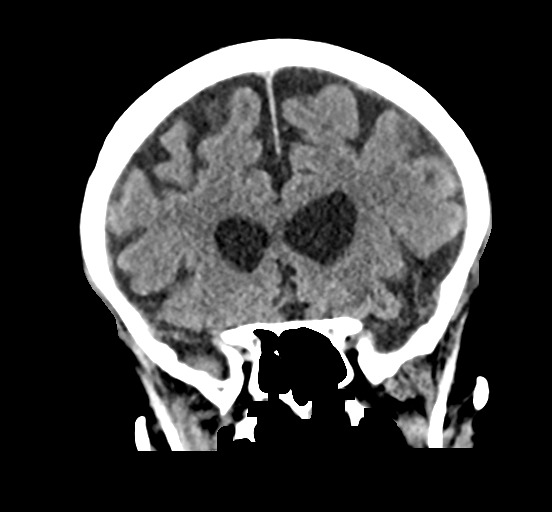
[im 31/70  brain]
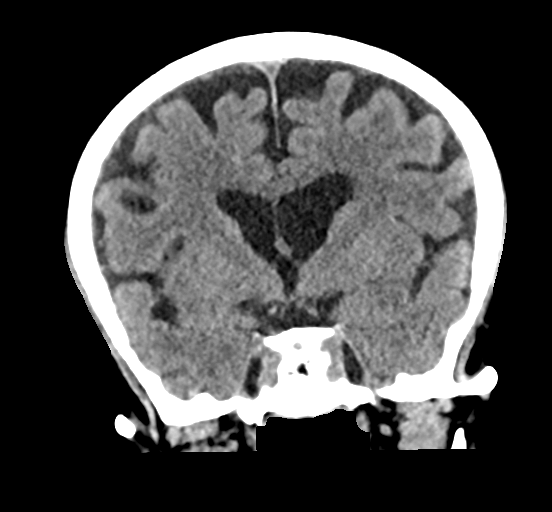
[im 39/70  brain]
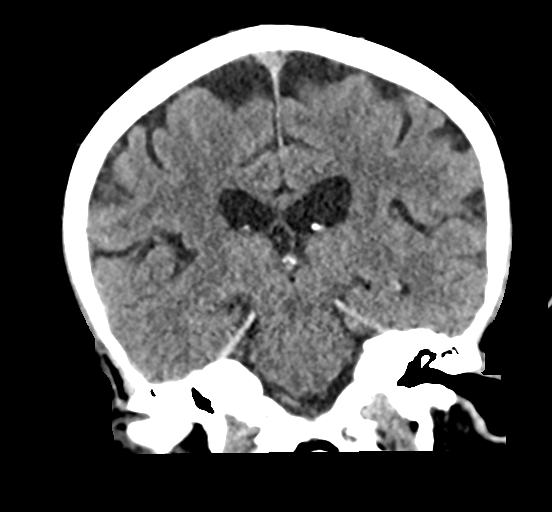

[Series 6: sag soft · sagittal · 0.31mm/px · 3 of 55 slices shown]
[im 19/55  brain]
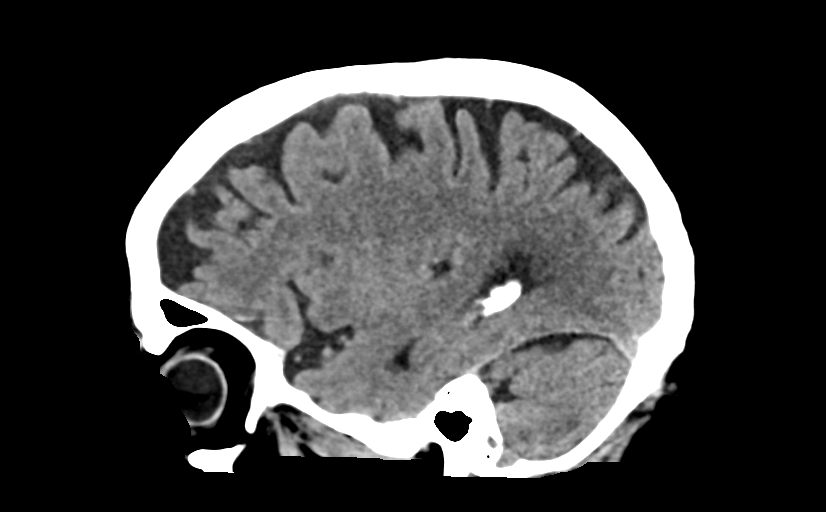
[im 28/55  brain]
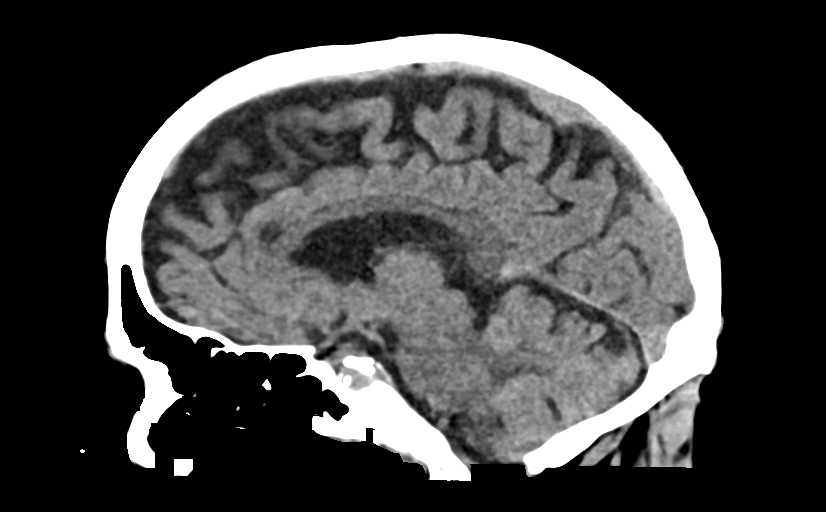
[im 37/55  brain]
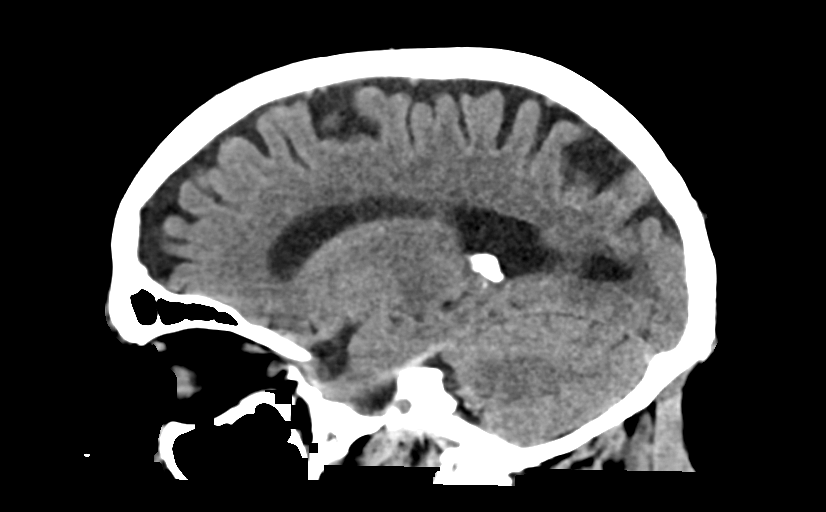

[16 of 47 positions shown; findings below may reference images not displayed]

FINDINGS: Brain: Age related volume loss. Mild chronic appearing small vessel
ischemic changes of the cerebral hemispheric white matter. No
cortical or large vessel territory infarction. No focal posterior
circulation insult is evident. No mass, hemorrhage, hydrocephalus or
extra-axial collection.

Vascular: There is atherosclerotic calcification of the major
vessels at the base of the brain.

Skull: Negative

Sinuses/Orbits: Clear/normal

Other: None
IMPRESSION: No acute finding. Age related atrophy. Mild chronic small-vessel
change of the cerebral hemispheric white matter.

## 2022-01-24 DIAGNOSIS — N186 End stage renal disease: Secondary | ICD-10-CM | POA: Diagnosis not present

## 2022-01-24 DIAGNOSIS — N2581 Secondary hyperparathyroidism of renal origin: Secondary | ICD-10-CM | POA: Diagnosis not present

## 2022-01-24 DIAGNOSIS — D631 Anemia in chronic kidney disease: Secondary | ICD-10-CM | POA: Diagnosis not present

## 2022-01-24 DIAGNOSIS — Z4932 Encounter for adequacy testing for peritoneal dialysis: Secondary | ICD-10-CM | POA: Diagnosis not present

## 2022-01-24 DIAGNOSIS — Z992 Dependence on renal dialysis: Secondary | ICD-10-CM | POA: Diagnosis not present

## 2022-01-24 DIAGNOSIS — D509 Iron deficiency anemia, unspecified: Secondary | ICD-10-CM | POA: Diagnosis not present

## 2022-01-25 DIAGNOSIS — N186 End stage renal disease: Secondary | ICD-10-CM | POA: Diagnosis not present

## 2022-01-25 DIAGNOSIS — Z4932 Encounter for adequacy testing for peritoneal dialysis: Secondary | ICD-10-CM | POA: Diagnosis not present

## 2022-01-25 DIAGNOSIS — D631 Anemia in chronic kidney disease: Secondary | ICD-10-CM | POA: Diagnosis not present

## 2022-01-25 DIAGNOSIS — Z992 Dependence on renal dialysis: Secondary | ICD-10-CM | POA: Diagnosis not present

## 2022-01-25 DIAGNOSIS — N2581 Secondary hyperparathyroidism of renal origin: Secondary | ICD-10-CM | POA: Diagnosis not present

## 2022-01-25 DIAGNOSIS — D509 Iron deficiency anemia, unspecified: Secondary | ICD-10-CM | POA: Diagnosis not present

## 2022-01-26 DIAGNOSIS — Z992 Dependence on renal dialysis: Secondary | ICD-10-CM | POA: Diagnosis not present

## 2022-01-26 DIAGNOSIS — D631 Anemia in chronic kidney disease: Secondary | ICD-10-CM | POA: Diagnosis not present

## 2022-01-26 DIAGNOSIS — D509 Iron deficiency anemia, unspecified: Secondary | ICD-10-CM | POA: Diagnosis not present

## 2022-01-26 DIAGNOSIS — Z4932 Encounter for adequacy testing for peritoneal dialysis: Secondary | ICD-10-CM | POA: Diagnosis not present

## 2022-01-26 DIAGNOSIS — N186 End stage renal disease: Secondary | ICD-10-CM | POA: Diagnosis not present

## 2022-01-26 DIAGNOSIS — N2581 Secondary hyperparathyroidism of renal origin: Secondary | ICD-10-CM | POA: Diagnosis not present

## 2022-01-27 DIAGNOSIS — D631 Anemia in chronic kidney disease: Secondary | ICD-10-CM | POA: Diagnosis not present

## 2022-01-27 DIAGNOSIS — Z4932 Encounter for adequacy testing for peritoneal dialysis: Secondary | ICD-10-CM | POA: Diagnosis not present

## 2022-01-27 DIAGNOSIS — N186 End stage renal disease: Secondary | ICD-10-CM | POA: Diagnosis not present

## 2022-01-27 DIAGNOSIS — N2581 Secondary hyperparathyroidism of renal origin: Secondary | ICD-10-CM | POA: Diagnosis not present

## 2022-01-27 DIAGNOSIS — Z992 Dependence on renal dialysis: Secondary | ICD-10-CM | POA: Diagnosis not present

## 2022-01-27 DIAGNOSIS — D509 Iron deficiency anemia, unspecified: Secondary | ICD-10-CM | POA: Diagnosis not present

## 2022-01-28 DIAGNOSIS — D509 Iron deficiency anemia, unspecified: Secondary | ICD-10-CM | POA: Diagnosis not present

## 2022-01-28 DIAGNOSIS — N2581 Secondary hyperparathyroidism of renal origin: Secondary | ICD-10-CM | POA: Diagnosis not present

## 2022-01-28 DIAGNOSIS — Z992 Dependence on renal dialysis: Secondary | ICD-10-CM | POA: Diagnosis not present

## 2022-01-28 DIAGNOSIS — Z4932 Encounter for adequacy testing for peritoneal dialysis: Secondary | ICD-10-CM | POA: Diagnosis not present

## 2022-01-28 DIAGNOSIS — D631 Anemia in chronic kidney disease: Secondary | ICD-10-CM | POA: Diagnosis not present

## 2022-01-28 DIAGNOSIS — N186 End stage renal disease: Secondary | ICD-10-CM | POA: Diagnosis not present

## 2022-01-29 DIAGNOSIS — Z992 Dependence on renal dialysis: Secondary | ICD-10-CM | POA: Diagnosis not present

## 2022-01-29 DIAGNOSIS — D631 Anemia in chronic kidney disease: Secondary | ICD-10-CM | POA: Diagnosis not present

## 2022-01-29 DIAGNOSIS — Z4932 Encounter for adequacy testing for peritoneal dialysis: Secondary | ICD-10-CM | POA: Diagnosis not present

## 2022-01-29 DIAGNOSIS — N2581 Secondary hyperparathyroidism of renal origin: Secondary | ICD-10-CM | POA: Diagnosis not present

## 2022-01-29 DIAGNOSIS — D509 Iron deficiency anemia, unspecified: Secondary | ICD-10-CM | POA: Diagnosis not present

## 2022-01-29 DIAGNOSIS — N186 End stage renal disease: Secondary | ICD-10-CM | POA: Diagnosis not present

## 2022-01-30 DIAGNOSIS — Z992 Dependence on renal dialysis: Secondary | ICD-10-CM | POA: Diagnosis not present

## 2022-01-30 DIAGNOSIS — N2581 Secondary hyperparathyroidism of renal origin: Secondary | ICD-10-CM | POA: Diagnosis not present

## 2022-01-30 DIAGNOSIS — N186 End stage renal disease: Secondary | ICD-10-CM | POA: Diagnosis not present

## 2022-01-30 DIAGNOSIS — D509 Iron deficiency anemia, unspecified: Secondary | ICD-10-CM | POA: Diagnosis not present

## 2022-01-30 DIAGNOSIS — Z4932 Encounter for adequacy testing for peritoneal dialysis: Secondary | ICD-10-CM | POA: Diagnosis not present

## 2022-01-30 DIAGNOSIS — D631 Anemia in chronic kidney disease: Secondary | ICD-10-CM | POA: Diagnosis not present

## 2022-01-31 DIAGNOSIS — D631 Anemia in chronic kidney disease: Secondary | ICD-10-CM | POA: Diagnosis not present

## 2022-01-31 DIAGNOSIS — Z992 Dependence on renal dialysis: Secondary | ICD-10-CM | POA: Diagnosis not present

## 2022-01-31 DIAGNOSIS — D509 Iron deficiency anemia, unspecified: Secondary | ICD-10-CM | POA: Diagnosis not present

## 2022-01-31 DIAGNOSIS — N2581 Secondary hyperparathyroidism of renal origin: Secondary | ICD-10-CM | POA: Diagnosis not present

## 2022-01-31 DIAGNOSIS — N186 End stage renal disease: Secondary | ICD-10-CM | POA: Diagnosis not present

## 2022-01-31 DIAGNOSIS — Z4932 Encounter for adequacy testing for peritoneal dialysis: Secondary | ICD-10-CM | POA: Diagnosis not present

## 2022-02-01 DIAGNOSIS — D509 Iron deficiency anemia, unspecified: Secondary | ICD-10-CM | POA: Diagnosis not present

## 2022-02-01 DIAGNOSIS — D631 Anemia in chronic kidney disease: Secondary | ICD-10-CM | POA: Diagnosis not present

## 2022-02-01 DIAGNOSIS — Z4932 Encounter for adequacy testing for peritoneal dialysis: Secondary | ICD-10-CM | POA: Diagnosis not present

## 2022-02-01 DIAGNOSIS — N186 End stage renal disease: Secondary | ICD-10-CM | POA: Diagnosis not present

## 2022-02-01 DIAGNOSIS — Z992 Dependence on renal dialysis: Secondary | ICD-10-CM | POA: Diagnosis not present

## 2022-02-01 DIAGNOSIS — N2581 Secondary hyperparathyroidism of renal origin: Secondary | ICD-10-CM | POA: Diagnosis not present

## 2022-02-02 DIAGNOSIS — N2581 Secondary hyperparathyroidism of renal origin: Secondary | ICD-10-CM | POA: Diagnosis not present

## 2022-02-02 DIAGNOSIS — D509 Iron deficiency anemia, unspecified: Secondary | ICD-10-CM | POA: Diagnosis not present

## 2022-02-02 DIAGNOSIS — D631 Anemia in chronic kidney disease: Secondary | ICD-10-CM | POA: Diagnosis not present

## 2022-02-02 DIAGNOSIS — Z992 Dependence on renal dialysis: Secondary | ICD-10-CM | POA: Diagnosis not present

## 2022-02-02 DIAGNOSIS — N186 End stage renal disease: Secondary | ICD-10-CM | POA: Diagnosis not present

## 2022-02-02 DIAGNOSIS — Z4932 Encounter for adequacy testing for peritoneal dialysis: Secondary | ICD-10-CM | POA: Diagnosis not present

## 2022-02-03 DIAGNOSIS — N2581 Secondary hyperparathyroidism of renal origin: Secondary | ICD-10-CM | POA: Diagnosis not present

## 2022-02-03 DIAGNOSIS — D631 Anemia in chronic kidney disease: Secondary | ICD-10-CM | POA: Diagnosis not present

## 2022-02-03 DIAGNOSIS — Z992 Dependence on renal dialysis: Secondary | ICD-10-CM | POA: Diagnosis not present

## 2022-02-03 DIAGNOSIS — N186 End stage renal disease: Secondary | ICD-10-CM | POA: Diagnosis not present

## 2022-02-03 DIAGNOSIS — D509 Iron deficiency anemia, unspecified: Secondary | ICD-10-CM | POA: Diagnosis not present

## 2022-02-03 DIAGNOSIS — Z4932 Encounter for adequacy testing for peritoneal dialysis: Secondary | ICD-10-CM | POA: Diagnosis not present

## 2022-02-04 DIAGNOSIS — Z992 Dependence on renal dialysis: Secondary | ICD-10-CM | POA: Diagnosis not present

## 2022-02-04 DIAGNOSIS — D631 Anemia in chronic kidney disease: Secondary | ICD-10-CM | POA: Diagnosis not present

## 2022-02-04 DIAGNOSIS — N2581 Secondary hyperparathyroidism of renal origin: Secondary | ICD-10-CM | POA: Diagnosis not present

## 2022-02-04 DIAGNOSIS — Z4932 Encounter for adequacy testing for peritoneal dialysis: Secondary | ICD-10-CM | POA: Diagnosis not present

## 2022-02-04 DIAGNOSIS — D509 Iron deficiency anemia, unspecified: Secondary | ICD-10-CM | POA: Diagnosis not present

## 2022-02-04 DIAGNOSIS — N186 End stage renal disease: Secondary | ICD-10-CM | POA: Diagnosis not present

## 2022-02-05 DIAGNOSIS — Z992 Dependence on renal dialysis: Secondary | ICD-10-CM | POA: Diagnosis not present

## 2022-02-05 DIAGNOSIS — N186 End stage renal disease: Secondary | ICD-10-CM | POA: Diagnosis not present

## 2022-02-05 DIAGNOSIS — Z4932 Encounter for adequacy testing for peritoneal dialysis: Secondary | ICD-10-CM | POA: Diagnosis not present

## 2022-02-05 DIAGNOSIS — N2581 Secondary hyperparathyroidism of renal origin: Secondary | ICD-10-CM | POA: Diagnosis not present

## 2022-02-05 DIAGNOSIS — D631 Anemia in chronic kidney disease: Secondary | ICD-10-CM | POA: Diagnosis not present

## 2022-02-05 DIAGNOSIS — D509 Iron deficiency anemia, unspecified: Secondary | ICD-10-CM | POA: Diagnosis not present

## 2022-02-06 DIAGNOSIS — N186 End stage renal disease: Secondary | ICD-10-CM | POA: Diagnosis not present

## 2022-02-06 DIAGNOSIS — N2581 Secondary hyperparathyroidism of renal origin: Secondary | ICD-10-CM | POA: Diagnosis not present

## 2022-02-06 DIAGNOSIS — D509 Iron deficiency anemia, unspecified: Secondary | ICD-10-CM | POA: Diagnosis not present

## 2022-02-06 DIAGNOSIS — Z992 Dependence on renal dialysis: Secondary | ICD-10-CM | POA: Diagnosis not present

## 2022-02-06 DIAGNOSIS — Z4932 Encounter for adequacy testing for peritoneal dialysis: Secondary | ICD-10-CM | POA: Diagnosis not present

## 2022-02-06 DIAGNOSIS — D631 Anemia in chronic kidney disease: Secondary | ICD-10-CM | POA: Diagnosis not present

## 2022-02-07 DIAGNOSIS — N2581 Secondary hyperparathyroidism of renal origin: Secondary | ICD-10-CM | POA: Diagnosis not present

## 2022-02-07 DIAGNOSIS — D509 Iron deficiency anemia, unspecified: Secondary | ICD-10-CM | POA: Diagnosis not present

## 2022-02-07 DIAGNOSIS — D631 Anemia in chronic kidney disease: Secondary | ICD-10-CM | POA: Diagnosis not present

## 2022-02-07 DIAGNOSIS — Z4932 Encounter for adequacy testing for peritoneal dialysis: Secondary | ICD-10-CM | POA: Diagnosis not present

## 2022-02-07 DIAGNOSIS — Z992 Dependence on renal dialysis: Secondary | ICD-10-CM | POA: Diagnosis not present

## 2022-02-07 DIAGNOSIS — N186 End stage renal disease: Secondary | ICD-10-CM | POA: Diagnosis not present

## 2022-02-08 DIAGNOSIS — D631 Anemia in chronic kidney disease: Secondary | ICD-10-CM | POA: Diagnosis not present

## 2022-02-08 DIAGNOSIS — Z992 Dependence on renal dialysis: Secondary | ICD-10-CM | POA: Diagnosis not present

## 2022-02-08 DIAGNOSIS — D509 Iron deficiency anemia, unspecified: Secondary | ICD-10-CM | POA: Diagnosis not present

## 2022-02-08 DIAGNOSIS — Z4932 Encounter for adequacy testing for peritoneal dialysis: Secondary | ICD-10-CM | POA: Diagnosis not present

## 2022-02-08 DIAGNOSIS — N186 End stage renal disease: Secondary | ICD-10-CM | POA: Diagnosis not present

## 2022-02-08 DIAGNOSIS — N2581 Secondary hyperparathyroidism of renal origin: Secondary | ICD-10-CM | POA: Diagnosis not present

## 2022-02-09 DIAGNOSIS — N186 End stage renal disease: Secondary | ICD-10-CM | POA: Diagnosis not present

## 2022-02-09 DIAGNOSIS — I129 Hypertensive chronic kidney disease with stage 1 through stage 4 chronic kidney disease, or unspecified chronic kidney disease: Secondary | ICD-10-CM | POA: Diagnosis not present

## 2022-02-09 DIAGNOSIS — Z4932 Encounter for adequacy testing for peritoneal dialysis: Secondary | ICD-10-CM | POA: Diagnosis not present

## 2022-02-09 DIAGNOSIS — N2581 Secondary hyperparathyroidism of renal origin: Secondary | ICD-10-CM | POA: Diagnosis not present

## 2022-02-09 DIAGNOSIS — Z992 Dependence on renal dialysis: Secondary | ICD-10-CM | POA: Diagnosis not present

## 2022-02-09 DIAGNOSIS — D509 Iron deficiency anemia, unspecified: Secondary | ICD-10-CM | POA: Diagnosis not present

## 2022-02-09 DIAGNOSIS — D631 Anemia in chronic kidney disease: Secondary | ICD-10-CM | POA: Diagnosis not present

## 2022-02-10 DIAGNOSIS — Z4932 Encounter for adequacy testing for peritoneal dialysis: Secondary | ICD-10-CM | POA: Diagnosis not present

## 2022-02-10 DIAGNOSIS — D509 Iron deficiency anemia, unspecified: Secondary | ICD-10-CM | POA: Diagnosis not present

## 2022-02-10 DIAGNOSIS — D631 Anemia in chronic kidney disease: Secondary | ICD-10-CM | POA: Diagnosis not present

## 2022-02-10 DIAGNOSIS — Z23 Encounter for immunization: Secondary | ICD-10-CM | POA: Diagnosis not present

## 2022-02-10 DIAGNOSIS — N186 End stage renal disease: Secondary | ICD-10-CM | POA: Diagnosis not present

## 2022-02-10 DIAGNOSIS — N2581 Secondary hyperparathyroidism of renal origin: Secondary | ICD-10-CM | POA: Diagnosis not present

## 2022-02-10 DIAGNOSIS — Z992 Dependence on renal dialysis: Secondary | ICD-10-CM | POA: Diagnosis not present

## 2022-02-11 DIAGNOSIS — N2581 Secondary hyperparathyroidism of renal origin: Secondary | ICD-10-CM | POA: Diagnosis not present

## 2022-02-11 DIAGNOSIS — D509 Iron deficiency anemia, unspecified: Secondary | ICD-10-CM | POA: Diagnosis not present

## 2022-02-11 DIAGNOSIS — Z23 Encounter for immunization: Secondary | ICD-10-CM | POA: Diagnosis not present

## 2022-02-11 DIAGNOSIS — N186 End stage renal disease: Secondary | ICD-10-CM | POA: Diagnosis not present

## 2022-02-11 DIAGNOSIS — Z4932 Encounter for adequacy testing for peritoneal dialysis: Secondary | ICD-10-CM | POA: Diagnosis not present

## 2022-02-11 DIAGNOSIS — D631 Anemia in chronic kidney disease: Secondary | ICD-10-CM | POA: Diagnosis not present

## 2022-02-11 DIAGNOSIS — Z992 Dependence on renal dialysis: Secondary | ICD-10-CM | POA: Diagnosis not present

## 2022-02-12 DIAGNOSIS — N186 End stage renal disease: Secondary | ICD-10-CM | POA: Diagnosis not present

## 2022-02-12 DIAGNOSIS — D509 Iron deficiency anemia, unspecified: Secondary | ICD-10-CM | POA: Diagnosis not present

## 2022-02-12 DIAGNOSIS — N2581 Secondary hyperparathyroidism of renal origin: Secondary | ICD-10-CM | POA: Diagnosis not present

## 2022-02-12 DIAGNOSIS — D631 Anemia in chronic kidney disease: Secondary | ICD-10-CM | POA: Diagnosis not present

## 2022-02-12 DIAGNOSIS — Z4932 Encounter for adequacy testing for peritoneal dialysis: Secondary | ICD-10-CM | POA: Diagnosis not present

## 2022-02-12 DIAGNOSIS — Z23 Encounter for immunization: Secondary | ICD-10-CM | POA: Diagnosis not present

## 2022-02-12 DIAGNOSIS — Z992 Dependence on renal dialysis: Secondary | ICD-10-CM | POA: Diagnosis not present

## 2022-02-12 DIAGNOSIS — Z0000 Encounter for general adult medical examination without abnormal findings: Secondary | ICD-10-CM | POA: Diagnosis not present

## 2022-02-13 DIAGNOSIS — D631 Anemia in chronic kidney disease: Secondary | ICD-10-CM | POA: Diagnosis not present

## 2022-02-13 DIAGNOSIS — D509 Iron deficiency anemia, unspecified: Secondary | ICD-10-CM | POA: Diagnosis not present

## 2022-02-13 DIAGNOSIS — Z4932 Encounter for adequacy testing for peritoneal dialysis: Secondary | ICD-10-CM | POA: Diagnosis not present

## 2022-02-13 DIAGNOSIS — Z23 Encounter for immunization: Secondary | ICD-10-CM | POA: Diagnosis not present

## 2022-02-13 DIAGNOSIS — N186 End stage renal disease: Secondary | ICD-10-CM | POA: Diagnosis not present

## 2022-02-13 DIAGNOSIS — N2581 Secondary hyperparathyroidism of renal origin: Secondary | ICD-10-CM | POA: Diagnosis not present

## 2022-02-13 DIAGNOSIS — Z992 Dependence on renal dialysis: Secondary | ICD-10-CM | POA: Diagnosis not present

## 2022-02-14 DIAGNOSIS — D631 Anemia in chronic kidney disease: Secondary | ICD-10-CM | POA: Diagnosis not present

## 2022-02-14 DIAGNOSIS — Z992 Dependence on renal dialysis: Secondary | ICD-10-CM | POA: Diagnosis not present

## 2022-02-14 DIAGNOSIS — D509 Iron deficiency anemia, unspecified: Secondary | ICD-10-CM | POA: Diagnosis not present

## 2022-02-14 DIAGNOSIS — N2581 Secondary hyperparathyroidism of renal origin: Secondary | ICD-10-CM | POA: Diagnosis not present

## 2022-02-14 DIAGNOSIS — Z4932 Encounter for adequacy testing for peritoneal dialysis: Secondary | ICD-10-CM | POA: Diagnosis not present

## 2022-02-14 DIAGNOSIS — N186 End stage renal disease: Secondary | ICD-10-CM | POA: Diagnosis not present

## 2022-02-14 DIAGNOSIS — Z23 Encounter for immunization: Secondary | ICD-10-CM | POA: Diagnosis not present

## 2022-02-15 DIAGNOSIS — D631 Anemia in chronic kidney disease: Secondary | ICD-10-CM | POA: Diagnosis not present

## 2022-02-15 DIAGNOSIS — N2581 Secondary hyperparathyroidism of renal origin: Secondary | ICD-10-CM | POA: Diagnosis not present

## 2022-02-15 DIAGNOSIS — Z992 Dependence on renal dialysis: Secondary | ICD-10-CM | POA: Diagnosis not present

## 2022-02-15 DIAGNOSIS — D509 Iron deficiency anemia, unspecified: Secondary | ICD-10-CM | POA: Diagnosis not present

## 2022-02-15 DIAGNOSIS — Z23 Encounter for immunization: Secondary | ICD-10-CM | POA: Diagnosis not present

## 2022-02-15 DIAGNOSIS — N186 End stage renal disease: Secondary | ICD-10-CM | POA: Diagnosis not present

## 2022-02-15 DIAGNOSIS — Z4932 Encounter for adequacy testing for peritoneal dialysis: Secondary | ICD-10-CM | POA: Diagnosis not present

## 2022-02-16 DIAGNOSIS — Z23 Encounter for immunization: Secondary | ICD-10-CM | POA: Diagnosis not present

## 2022-02-16 DIAGNOSIS — Z992 Dependence on renal dialysis: Secondary | ICD-10-CM | POA: Diagnosis not present

## 2022-02-16 DIAGNOSIS — D631 Anemia in chronic kidney disease: Secondary | ICD-10-CM | POA: Diagnosis not present

## 2022-02-16 DIAGNOSIS — D509 Iron deficiency anemia, unspecified: Secondary | ICD-10-CM | POA: Diagnosis not present

## 2022-02-16 DIAGNOSIS — N2581 Secondary hyperparathyroidism of renal origin: Secondary | ICD-10-CM | POA: Diagnosis not present

## 2022-02-16 DIAGNOSIS — N186 End stage renal disease: Secondary | ICD-10-CM | POA: Diagnosis not present

## 2022-02-16 DIAGNOSIS — Z4932 Encounter for adequacy testing for peritoneal dialysis: Secondary | ICD-10-CM | POA: Diagnosis not present

## 2022-02-17 DIAGNOSIS — Z992 Dependence on renal dialysis: Secondary | ICD-10-CM | POA: Diagnosis not present

## 2022-02-17 DIAGNOSIS — Z4932 Encounter for adequacy testing for peritoneal dialysis: Secondary | ICD-10-CM | POA: Diagnosis not present

## 2022-02-17 DIAGNOSIS — N186 End stage renal disease: Secondary | ICD-10-CM | POA: Diagnosis not present

## 2022-02-17 DIAGNOSIS — Z23 Encounter for immunization: Secondary | ICD-10-CM | POA: Diagnosis not present

## 2022-02-17 DIAGNOSIS — D631 Anemia in chronic kidney disease: Secondary | ICD-10-CM | POA: Diagnosis not present

## 2022-02-17 DIAGNOSIS — N2581 Secondary hyperparathyroidism of renal origin: Secondary | ICD-10-CM | POA: Diagnosis not present

## 2022-02-17 DIAGNOSIS — D509 Iron deficiency anemia, unspecified: Secondary | ICD-10-CM | POA: Diagnosis not present

## 2022-02-18 DIAGNOSIS — D631 Anemia in chronic kidney disease: Secondary | ICD-10-CM | POA: Diagnosis not present

## 2022-02-18 DIAGNOSIS — D509 Iron deficiency anemia, unspecified: Secondary | ICD-10-CM | POA: Diagnosis not present

## 2022-02-18 DIAGNOSIS — Z23 Encounter for immunization: Secondary | ICD-10-CM | POA: Diagnosis not present

## 2022-02-18 DIAGNOSIS — Z992 Dependence on renal dialysis: Secondary | ICD-10-CM | POA: Diagnosis not present

## 2022-02-18 DIAGNOSIS — Z4932 Encounter for adequacy testing for peritoneal dialysis: Secondary | ICD-10-CM | POA: Diagnosis not present

## 2022-02-18 DIAGNOSIS — N186 End stage renal disease: Secondary | ICD-10-CM | POA: Diagnosis not present

## 2022-02-18 DIAGNOSIS — N2581 Secondary hyperparathyroidism of renal origin: Secondary | ICD-10-CM | POA: Diagnosis not present

## 2022-02-19 DIAGNOSIS — N186 End stage renal disease: Secondary | ICD-10-CM | POA: Diagnosis not present

## 2022-02-19 DIAGNOSIS — Z4932 Encounter for adequacy testing for peritoneal dialysis: Secondary | ICD-10-CM | POA: Diagnosis not present

## 2022-02-19 DIAGNOSIS — Z23 Encounter for immunization: Secondary | ICD-10-CM | POA: Diagnosis not present

## 2022-02-19 DIAGNOSIS — Z992 Dependence on renal dialysis: Secondary | ICD-10-CM | POA: Diagnosis not present

## 2022-02-19 DIAGNOSIS — N2581 Secondary hyperparathyroidism of renal origin: Secondary | ICD-10-CM | POA: Diagnosis not present

## 2022-02-19 DIAGNOSIS — D509 Iron deficiency anemia, unspecified: Secondary | ICD-10-CM | POA: Diagnosis not present

## 2022-02-19 DIAGNOSIS — D631 Anemia in chronic kidney disease: Secondary | ICD-10-CM | POA: Diagnosis not present

## 2022-02-20 DIAGNOSIS — Z992 Dependence on renal dialysis: Secondary | ICD-10-CM | POA: Diagnosis not present

## 2022-02-20 DIAGNOSIS — N186 End stage renal disease: Secondary | ICD-10-CM | POA: Diagnosis not present

## 2022-02-20 DIAGNOSIS — D509 Iron deficiency anemia, unspecified: Secondary | ICD-10-CM | POA: Diagnosis not present

## 2022-02-20 DIAGNOSIS — N2581 Secondary hyperparathyroidism of renal origin: Secondary | ICD-10-CM | POA: Diagnosis not present

## 2022-02-20 DIAGNOSIS — Z23 Encounter for immunization: Secondary | ICD-10-CM | POA: Diagnosis not present

## 2022-02-20 DIAGNOSIS — D631 Anemia in chronic kidney disease: Secondary | ICD-10-CM | POA: Diagnosis not present

## 2022-02-20 DIAGNOSIS — Z4932 Encounter for adequacy testing for peritoneal dialysis: Secondary | ICD-10-CM | POA: Diagnosis not present

## 2022-02-21 DIAGNOSIS — N2581 Secondary hyperparathyroidism of renal origin: Secondary | ICD-10-CM | POA: Diagnosis not present

## 2022-02-21 DIAGNOSIS — Z992 Dependence on renal dialysis: Secondary | ICD-10-CM | POA: Diagnosis not present

## 2022-02-21 DIAGNOSIS — D509 Iron deficiency anemia, unspecified: Secondary | ICD-10-CM | POA: Diagnosis not present

## 2022-02-21 DIAGNOSIS — D631 Anemia in chronic kidney disease: Secondary | ICD-10-CM | POA: Diagnosis not present

## 2022-02-21 DIAGNOSIS — Z4932 Encounter for adequacy testing for peritoneal dialysis: Secondary | ICD-10-CM | POA: Diagnosis not present

## 2022-02-21 DIAGNOSIS — N186 End stage renal disease: Secondary | ICD-10-CM | POA: Diagnosis not present

## 2022-02-21 DIAGNOSIS — Z23 Encounter for immunization: Secondary | ICD-10-CM | POA: Diagnosis not present

## 2022-02-22 DIAGNOSIS — Z23 Encounter for immunization: Secondary | ICD-10-CM | POA: Diagnosis not present

## 2022-02-22 DIAGNOSIS — N2581 Secondary hyperparathyroidism of renal origin: Secondary | ICD-10-CM | POA: Diagnosis not present

## 2022-02-22 DIAGNOSIS — Z4932 Encounter for adequacy testing for peritoneal dialysis: Secondary | ICD-10-CM | POA: Diagnosis not present

## 2022-02-22 DIAGNOSIS — D631 Anemia in chronic kidney disease: Secondary | ICD-10-CM | POA: Diagnosis not present

## 2022-02-22 DIAGNOSIS — N186 End stage renal disease: Secondary | ICD-10-CM | POA: Diagnosis not present

## 2022-02-22 DIAGNOSIS — D509 Iron deficiency anemia, unspecified: Secondary | ICD-10-CM | POA: Diagnosis not present

## 2022-02-22 DIAGNOSIS — Z992 Dependence on renal dialysis: Secondary | ICD-10-CM | POA: Diagnosis not present

## 2022-02-23 DIAGNOSIS — Z992 Dependence on renal dialysis: Secondary | ICD-10-CM | POA: Diagnosis not present

## 2022-02-23 DIAGNOSIS — N2581 Secondary hyperparathyroidism of renal origin: Secondary | ICD-10-CM | POA: Diagnosis not present

## 2022-02-23 DIAGNOSIS — Z4932 Encounter for adequacy testing for peritoneal dialysis: Secondary | ICD-10-CM | POA: Diagnosis not present

## 2022-02-23 DIAGNOSIS — N186 End stage renal disease: Secondary | ICD-10-CM | POA: Diagnosis not present

## 2022-02-23 DIAGNOSIS — Z23 Encounter for immunization: Secondary | ICD-10-CM | POA: Diagnosis not present

## 2022-02-23 DIAGNOSIS — D631 Anemia in chronic kidney disease: Secondary | ICD-10-CM | POA: Diagnosis not present

## 2022-02-23 DIAGNOSIS — D509 Iron deficiency anemia, unspecified: Secondary | ICD-10-CM | POA: Diagnosis not present

## 2022-02-24 DIAGNOSIS — Z992 Dependence on renal dialysis: Secondary | ICD-10-CM | POA: Diagnosis not present

## 2022-02-24 DIAGNOSIS — N2581 Secondary hyperparathyroidism of renal origin: Secondary | ICD-10-CM | POA: Diagnosis not present

## 2022-02-24 DIAGNOSIS — Z4932 Encounter for adequacy testing for peritoneal dialysis: Secondary | ICD-10-CM | POA: Diagnosis not present

## 2022-02-24 DIAGNOSIS — D509 Iron deficiency anemia, unspecified: Secondary | ICD-10-CM | POA: Diagnosis not present

## 2022-02-24 DIAGNOSIS — N186 End stage renal disease: Secondary | ICD-10-CM | POA: Diagnosis not present

## 2022-02-24 DIAGNOSIS — D631 Anemia in chronic kidney disease: Secondary | ICD-10-CM | POA: Diagnosis not present

## 2022-02-24 DIAGNOSIS — Z23 Encounter for immunization: Secondary | ICD-10-CM | POA: Diagnosis not present

## 2022-02-25 DIAGNOSIS — Z23 Encounter for immunization: Secondary | ICD-10-CM | POA: Diagnosis not present

## 2022-02-25 DIAGNOSIS — Z4932 Encounter for adequacy testing for peritoneal dialysis: Secondary | ICD-10-CM | POA: Diagnosis not present

## 2022-02-25 DIAGNOSIS — D509 Iron deficiency anemia, unspecified: Secondary | ICD-10-CM | POA: Diagnosis not present

## 2022-02-25 DIAGNOSIS — Z992 Dependence on renal dialysis: Secondary | ICD-10-CM | POA: Diagnosis not present

## 2022-02-25 DIAGNOSIS — N186 End stage renal disease: Secondary | ICD-10-CM | POA: Diagnosis not present

## 2022-02-25 DIAGNOSIS — N2581 Secondary hyperparathyroidism of renal origin: Secondary | ICD-10-CM | POA: Diagnosis not present

## 2022-02-25 DIAGNOSIS — D631 Anemia in chronic kidney disease: Secondary | ICD-10-CM | POA: Diagnosis not present

## 2022-02-26 DIAGNOSIS — D631 Anemia in chronic kidney disease: Secondary | ICD-10-CM | POA: Diagnosis not present

## 2022-02-26 DIAGNOSIS — D509 Iron deficiency anemia, unspecified: Secondary | ICD-10-CM | POA: Diagnosis not present

## 2022-02-26 DIAGNOSIS — Z992 Dependence on renal dialysis: Secondary | ICD-10-CM | POA: Diagnosis not present

## 2022-02-26 DIAGNOSIS — Z23 Encounter for immunization: Secondary | ICD-10-CM | POA: Diagnosis not present

## 2022-02-26 DIAGNOSIS — N186 End stage renal disease: Secondary | ICD-10-CM | POA: Diagnosis not present

## 2022-02-26 DIAGNOSIS — N2581 Secondary hyperparathyroidism of renal origin: Secondary | ICD-10-CM | POA: Diagnosis not present

## 2022-02-26 DIAGNOSIS — Z4932 Encounter for adequacy testing for peritoneal dialysis: Secondary | ICD-10-CM | POA: Diagnosis not present

## 2022-02-27 DIAGNOSIS — N2581 Secondary hyperparathyroidism of renal origin: Secondary | ICD-10-CM | POA: Diagnosis not present

## 2022-02-27 DIAGNOSIS — Z992 Dependence on renal dialysis: Secondary | ICD-10-CM | POA: Diagnosis not present

## 2022-02-27 DIAGNOSIS — Z4932 Encounter for adequacy testing for peritoneal dialysis: Secondary | ICD-10-CM | POA: Diagnosis not present

## 2022-02-27 DIAGNOSIS — N186 End stage renal disease: Secondary | ICD-10-CM | POA: Diagnosis not present

## 2022-02-27 DIAGNOSIS — D509 Iron deficiency anemia, unspecified: Secondary | ICD-10-CM | POA: Diagnosis not present

## 2022-02-27 DIAGNOSIS — Z23 Encounter for immunization: Secondary | ICD-10-CM | POA: Diagnosis not present

## 2022-02-27 DIAGNOSIS — D631 Anemia in chronic kidney disease: Secondary | ICD-10-CM | POA: Diagnosis not present

## 2022-02-28 DIAGNOSIS — Z4932 Encounter for adequacy testing for peritoneal dialysis: Secondary | ICD-10-CM | POA: Diagnosis not present

## 2022-02-28 DIAGNOSIS — N186 End stage renal disease: Secondary | ICD-10-CM | POA: Diagnosis not present

## 2022-02-28 DIAGNOSIS — D631 Anemia in chronic kidney disease: Secondary | ICD-10-CM | POA: Diagnosis not present

## 2022-02-28 DIAGNOSIS — Z23 Encounter for immunization: Secondary | ICD-10-CM | POA: Diagnosis not present

## 2022-02-28 DIAGNOSIS — D509 Iron deficiency anemia, unspecified: Secondary | ICD-10-CM | POA: Diagnosis not present

## 2022-02-28 DIAGNOSIS — Z992 Dependence on renal dialysis: Secondary | ICD-10-CM | POA: Diagnosis not present

## 2022-02-28 DIAGNOSIS — N2581 Secondary hyperparathyroidism of renal origin: Secondary | ICD-10-CM | POA: Diagnosis not present

## 2022-03-01 DIAGNOSIS — N186 End stage renal disease: Secondary | ICD-10-CM | POA: Diagnosis not present

## 2022-03-01 DIAGNOSIS — Z992 Dependence on renal dialysis: Secondary | ICD-10-CM | POA: Diagnosis not present

## 2022-03-01 DIAGNOSIS — N2581 Secondary hyperparathyroidism of renal origin: Secondary | ICD-10-CM | POA: Diagnosis not present

## 2022-03-01 DIAGNOSIS — D509 Iron deficiency anemia, unspecified: Secondary | ICD-10-CM | POA: Diagnosis not present

## 2022-03-01 DIAGNOSIS — Z23 Encounter for immunization: Secondary | ICD-10-CM | POA: Diagnosis not present

## 2022-03-01 DIAGNOSIS — Z4932 Encounter for adequacy testing for peritoneal dialysis: Secondary | ICD-10-CM | POA: Diagnosis not present

## 2022-03-01 DIAGNOSIS — D631 Anemia in chronic kidney disease: Secondary | ICD-10-CM | POA: Diagnosis not present

## 2022-03-02 DIAGNOSIS — N2581 Secondary hyperparathyroidism of renal origin: Secondary | ICD-10-CM | POA: Diagnosis not present

## 2022-03-02 DIAGNOSIS — Z4932 Encounter for adequacy testing for peritoneal dialysis: Secondary | ICD-10-CM | POA: Diagnosis not present

## 2022-03-02 DIAGNOSIS — Z992 Dependence on renal dialysis: Secondary | ICD-10-CM | POA: Diagnosis not present

## 2022-03-02 DIAGNOSIS — D509 Iron deficiency anemia, unspecified: Secondary | ICD-10-CM | POA: Diagnosis not present

## 2022-03-02 DIAGNOSIS — N186 End stage renal disease: Secondary | ICD-10-CM | POA: Diagnosis not present

## 2022-03-02 DIAGNOSIS — Z23 Encounter for immunization: Secondary | ICD-10-CM | POA: Diagnosis not present

## 2022-03-02 DIAGNOSIS — D631 Anemia in chronic kidney disease: Secondary | ICD-10-CM | POA: Diagnosis not present

## 2022-03-03 DIAGNOSIS — Z4932 Encounter for adequacy testing for peritoneal dialysis: Secondary | ICD-10-CM | POA: Diagnosis not present

## 2022-03-03 DIAGNOSIS — D631 Anemia in chronic kidney disease: Secondary | ICD-10-CM | POA: Diagnosis not present

## 2022-03-03 DIAGNOSIS — N186 End stage renal disease: Secondary | ICD-10-CM | POA: Diagnosis not present

## 2022-03-03 DIAGNOSIS — Z23 Encounter for immunization: Secondary | ICD-10-CM | POA: Diagnosis not present

## 2022-03-03 DIAGNOSIS — N2581 Secondary hyperparathyroidism of renal origin: Secondary | ICD-10-CM | POA: Diagnosis not present

## 2022-03-03 DIAGNOSIS — D509 Iron deficiency anemia, unspecified: Secondary | ICD-10-CM | POA: Diagnosis not present

## 2022-03-03 DIAGNOSIS — Z992 Dependence on renal dialysis: Secondary | ICD-10-CM | POA: Diagnosis not present

## 2022-03-04 DIAGNOSIS — D509 Iron deficiency anemia, unspecified: Secondary | ICD-10-CM | POA: Diagnosis not present

## 2022-03-04 DIAGNOSIS — N186 End stage renal disease: Secondary | ICD-10-CM | POA: Diagnosis not present

## 2022-03-04 DIAGNOSIS — N2581 Secondary hyperparathyroidism of renal origin: Secondary | ICD-10-CM | POA: Diagnosis not present

## 2022-03-04 DIAGNOSIS — Z23 Encounter for immunization: Secondary | ICD-10-CM | POA: Diagnosis not present

## 2022-03-04 DIAGNOSIS — Z992 Dependence on renal dialysis: Secondary | ICD-10-CM | POA: Diagnosis not present

## 2022-03-04 DIAGNOSIS — Z4932 Encounter for adequacy testing for peritoneal dialysis: Secondary | ICD-10-CM | POA: Diagnosis not present

## 2022-03-04 DIAGNOSIS — D631 Anemia in chronic kidney disease: Secondary | ICD-10-CM | POA: Diagnosis not present

## 2022-03-05 DIAGNOSIS — D631 Anemia in chronic kidney disease: Secondary | ICD-10-CM | POA: Diagnosis not present

## 2022-03-05 DIAGNOSIS — Z4932 Encounter for adequacy testing for peritoneal dialysis: Secondary | ICD-10-CM | POA: Diagnosis not present

## 2022-03-05 DIAGNOSIS — D509 Iron deficiency anemia, unspecified: Secondary | ICD-10-CM | POA: Diagnosis not present

## 2022-03-05 DIAGNOSIS — Z992 Dependence on renal dialysis: Secondary | ICD-10-CM | POA: Diagnosis not present

## 2022-03-05 DIAGNOSIS — N2581 Secondary hyperparathyroidism of renal origin: Secondary | ICD-10-CM | POA: Diagnosis not present

## 2022-03-05 DIAGNOSIS — Z23 Encounter for immunization: Secondary | ICD-10-CM | POA: Diagnosis not present

## 2022-03-05 DIAGNOSIS — N186 End stage renal disease: Secondary | ICD-10-CM | POA: Diagnosis not present

## 2022-03-06 DIAGNOSIS — Z23 Encounter for immunization: Secondary | ICD-10-CM | POA: Diagnosis not present

## 2022-03-06 DIAGNOSIS — Z4932 Encounter for adequacy testing for peritoneal dialysis: Secondary | ICD-10-CM | POA: Diagnosis not present

## 2022-03-06 DIAGNOSIS — N2581 Secondary hyperparathyroidism of renal origin: Secondary | ICD-10-CM | POA: Diagnosis not present

## 2022-03-06 DIAGNOSIS — D509 Iron deficiency anemia, unspecified: Secondary | ICD-10-CM | POA: Diagnosis not present

## 2022-03-06 DIAGNOSIS — N186 End stage renal disease: Secondary | ICD-10-CM | POA: Diagnosis not present

## 2022-03-06 DIAGNOSIS — D631 Anemia in chronic kidney disease: Secondary | ICD-10-CM | POA: Diagnosis not present

## 2022-03-06 DIAGNOSIS — Z992 Dependence on renal dialysis: Secondary | ICD-10-CM | POA: Diagnosis not present

## 2022-03-07 DIAGNOSIS — D509 Iron deficiency anemia, unspecified: Secondary | ICD-10-CM | POA: Diagnosis not present

## 2022-03-07 DIAGNOSIS — N186 End stage renal disease: Secondary | ICD-10-CM | POA: Diagnosis not present

## 2022-03-07 DIAGNOSIS — D631 Anemia in chronic kidney disease: Secondary | ICD-10-CM | POA: Diagnosis not present

## 2022-03-07 DIAGNOSIS — Z23 Encounter for immunization: Secondary | ICD-10-CM | POA: Diagnosis not present

## 2022-03-07 DIAGNOSIS — Z4932 Encounter for adequacy testing for peritoneal dialysis: Secondary | ICD-10-CM | POA: Diagnosis not present

## 2022-03-07 DIAGNOSIS — N2581 Secondary hyperparathyroidism of renal origin: Secondary | ICD-10-CM | POA: Diagnosis not present

## 2022-03-07 DIAGNOSIS — Z992 Dependence on renal dialysis: Secondary | ICD-10-CM | POA: Diagnosis not present

## 2022-03-08 DIAGNOSIS — Z23 Encounter for immunization: Secondary | ICD-10-CM | POA: Diagnosis not present

## 2022-03-08 DIAGNOSIS — N2581 Secondary hyperparathyroidism of renal origin: Secondary | ICD-10-CM | POA: Diagnosis not present

## 2022-03-08 DIAGNOSIS — D509 Iron deficiency anemia, unspecified: Secondary | ICD-10-CM | POA: Diagnosis not present

## 2022-03-08 DIAGNOSIS — Z992 Dependence on renal dialysis: Secondary | ICD-10-CM | POA: Diagnosis not present

## 2022-03-08 DIAGNOSIS — Z4932 Encounter for adequacy testing for peritoneal dialysis: Secondary | ICD-10-CM | POA: Diagnosis not present

## 2022-03-08 DIAGNOSIS — N186 End stage renal disease: Secondary | ICD-10-CM | POA: Diagnosis not present

## 2022-03-08 DIAGNOSIS — D631 Anemia in chronic kidney disease: Secondary | ICD-10-CM | POA: Diagnosis not present

## 2022-03-09 DIAGNOSIS — Z23 Encounter for immunization: Secondary | ICD-10-CM | POA: Diagnosis not present

## 2022-03-09 DIAGNOSIS — N186 End stage renal disease: Secondary | ICD-10-CM | POA: Diagnosis not present

## 2022-03-09 DIAGNOSIS — D631 Anemia in chronic kidney disease: Secondary | ICD-10-CM | POA: Diagnosis not present

## 2022-03-09 DIAGNOSIS — Z992 Dependence on renal dialysis: Secondary | ICD-10-CM | POA: Diagnosis not present

## 2022-03-09 DIAGNOSIS — D509 Iron deficiency anemia, unspecified: Secondary | ICD-10-CM | POA: Diagnosis not present

## 2022-03-09 DIAGNOSIS — Z4932 Encounter for adequacy testing for peritoneal dialysis: Secondary | ICD-10-CM | POA: Diagnosis not present

## 2022-03-09 DIAGNOSIS — N2581 Secondary hyperparathyroidism of renal origin: Secondary | ICD-10-CM | POA: Diagnosis not present

## 2022-03-10 DIAGNOSIS — Z992 Dependence on renal dialysis: Secondary | ICD-10-CM | POA: Diagnosis not present

## 2022-03-10 DIAGNOSIS — D631 Anemia in chronic kidney disease: Secondary | ICD-10-CM | POA: Diagnosis not present

## 2022-03-10 DIAGNOSIS — N2581 Secondary hyperparathyroidism of renal origin: Secondary | ICD-10-CM | POA: Diagnosis not present

## 2022-03-10 DIAGNOSIS — N186 End stage renal disease: Secondary | ICD-10-CM | POA: Diagnosis not present

## 2022-03-10 DIAGNOSIS — D509 Iron deficiency anemia, unspecified: Secondary | ICD-10-CM | POA: Diagnosis not present

## 2022-03-10 DIAGNOSIS — Z23 Encounter for immunization: Secondary | ICD-10-CM | POA: Diagnosis not present

## 2022-03-10 DIAGNOSIS — Z4932 Encounter for adequacy testing for peritoneal dialysis: Secondary | ICD-10-CM | POA: Diagnosis not present

## 2022-03-11 DIAGNOSIS — N186 End stage renal disease: Secondary | ICD-10-CM | POA: Diagnosis not present

## 2022-03-11 DIAGNOSIS — D631 Anemia in chronic kidney disease: Secondary | ICD-10-CM | POA: Diagnosis not present

## 2022-03-11 DIAGNOSIS — Z4932 Encounter for adequacy testing for peritoneal dialysis: Secondary | ICD-10-CM | POA: Diagnosis not present

## 2022-03-11 DIAGNOSIS — D509 Iron deficiency anemia, unspecified: Secondary | ICD-10-CM | POA: Diagnosis not present

## 2022-03-11 DIAGNOSIS — I129 Hypertensive chronic kidney disease with stage 1 through stage 4 chronic kidney disease, or unspecified chronic kidney disease: Secondary | ICD-10-CM | POA: Diagnosis not present

## 2022-03-11 DIAGNOSIS — Z23 Encounter for immunization: Secondary | ICD-10-CM | POA: Diagnosis not present

## 2022-03-11 DIAGNOSIS — N2581 Secondary hyperparathyroidism of renal origin: Secondary | ICD-10-CM | POA: Diagnosis not present

## 2022-03-11 DIAGNOSIS — Z992 Dependence on renal dialysis: Secondary | ICD-10-CM | POA: Diagnosis not present

## 2022-03-12 DIAGNOSIS — N2581 Secondary hyperparathyroidism of renal origin: Secondary | ICD-10-CM | POA: Diagnosis not present

## 2022-03-12 DIAGNOSIS — D509 Iron deficiency anemia, unspecified: Secondary | ICD-10-CM | POA: Diagnosis not present

## 2022-03-12 DIAGNOSIS — Z4932 Encounter for adequacy testing for peritoneal dialysis: Secondary | ICD-10-CM | POA: Diagnosis not present

## 2022-03-12 DIAGNOSIS — D631 Anemia in chronic kidney disease: Secondary | ICD-10-CM | POA: Diagnosis not present

## 2022-03-12 DIAGNOSIS — Z992 Dependence on renal dialysis: Secondary | ICD-10-CM | POA: Diagnosis not present

## 2022-03-12 DIAGNOSIS — N186 End stage renal disease: Secondary | ICD-10-CM | POA: Diagnosis not present

## 2022-03-13 DIAGNOSIS — Z4932 Encounter for adequacy testing for peritoneal dialysis: Secondary | ICD-10-CM | POA: Diagnosis not present

## 2022-03-13 DIAGNOSIS — D509 Iron deficiency anemia, unspecified: Secondary | ICD-10-CM | POA: Diagnosis not present

## 2022-03-13 DIAGNOSIS — D631 Anemia in chronic kidney disease: Secondary | ICD-10-CM | POA: Diagnosis not present

## 2022-03-13 DIAGNOSIS — N2581 Secondary hyperparathyroidism of renal origin: Secondary | ICD-10-CM | POA: Diagnosis not present

## 2022-03-13 DIAGNOSIS — Z992 Dependence on renal dialysis: Secondary | ICD-10-CM | POA: Diagnosis not present

## 2022-03-13 DIAGNOSIS — N186 End stage renal disease: Secondary | ICD-10-CM | POA: Diagnosis not present

## 2022-03-14 DIAGNOSIS — Z992 Dependence on renal dialysis: Secondary | ICD-10-CM | POA: Diagnosis not present

## 2022-03-14 DIAGNOSIS — N2581 Secondary hyperparathyroidism of renal origin: Secondary | ICD-10-CM | POA: Diagnosis not present

## 2022-03-14 DIAGNOSIS — D509 Iron deficiency anemia, unspecified: Secondary | ICD-10-CM | POA: Diagnosis not present

## 2022-03-14 DIAGNOSIS — D631 Anemia in chronic kidney disease: Secondary | ICD-10-CM | POA: Diagnosis not present

## 2022-03-14 DIAGNOSIS — Z4932 Encounter for adequacy testing for peritoneal dialysis: Secondary | ICD-10-CM | POA: Diagnosis not present

## 2022-03-14 DIAGNOSIS — N186 End stage renal disease: Secondary | ICD-10-CM | POA: Diagnosis not present

## 2022-03-15 DIAGNOSIS — Z4932 Encounter for adequacy testing for peritoneal dialysis: Secondary | ICD-10-CM | POA: Diagnosis not present

## 2022-03-15 DIAGNOSIS — N2581 Secondary hyperparathyroidism of renal origin: Secondary | ICD-10-CM | POA: Diagnosis not present

## 2022-03-15 DIAGNOSIS — N186 End stage renal disease: Secondary | ICD-10-CM | POA: Diagnosis not present

## 2022-03-15 DIAGNOSIS — D631 Anemia in chronic kidney disease: Secondary | ICD-10-CM | POA: Diagnosis not present

## 2022-03-15 DIAGNOSIS — D509 Iron deficiency anemia, unspecified: Secondary | ICD-10-CM | POA: Diagnosis not present

## 2022-03-15 DIAGNOSIS — Z992 Dependence on renal dialysis: Secondary | ICD-10-CM | POA: Diagnosis not present

## 2022-03-16 DIAGNOSIS — D509 Iron deficiency anemia, unspecified: Secondary | ICD-10-CM | POA: Diagnosis not present

## 2022-03-16 DIAGNOSIS — D631 Anemia in chronic kidney disease: Secondary | ICD-10-CM | POA: Diagnosis not present

## 2022-03-16 DIAGNOSIS — Z4932 Encounter for adequacy testing for peritoneal dialysis: Secondary | ICD-10-CM | POA: Diagnosis not present

## 2022-03-16 DIAGNOSIS — N2581 Secondary hyperparathyroidism of renal origin: Secondary | ICD-10-CM | POA: Diagnosis not present

## 2022-03-16 DIAGNOSIS — N186 End stage renal disease: Secondary | ICD-10-CM | POA: Diagnosis not present

## 2022-03-16 DIAGNOSIS — Z992 Dependence on renal dialysis: Secondary | ICD-10-CM | POA: Diagnosis not present

## 2022-03-17 DIAGNOSIS — D631 Anemia in chronic kidney disease: Secondary | ICD-10-CM | POA: Diagnosis not present

## 2022-03-17 DIAGNOSIS — N186 End stage renal disease: Secondary | ICD-10-CM | POA: Diagnosis not present

## 2022-03-17 DIAGNOSIS — Z992 Dependence on renal dialysis: Secondary | ICD-10-CM | POA: Diagnosis not present

## 2022-03-17 DIAGNOSIS — D509 Iron deficiency anemia, unspecified: Secondary | ICD-10-CM | POA: Diagnosis not present

## 2022-03-17 DIAGNOSIS — N2581 Secondary hyperparathyroidism of renal origin: Secondary | ICD-10-CM | POA: Diagnosis not present

## 2022-03-17 DIAGNOSIS — Z4932 Encounter for adequacy testing for peritoneal dialysis: Secondary | ICD-10-CM | POA: Diagnosis not present

## 2022-03-18 DIAGNOSIS — Z4932 Encounter for adequacy testing for peritoneal dialysis: Secondary | ICD-10-CM | POA: Diagnosis not present

## 2022-03-18 DIAGNOSIS — D509 Iron deficiency anemia, unspecified: Secondary | ICD-10-CM | POA: Diagnosis not present

## 2022-03-18 DIAGNOSIS — D631 Anemia in chronic kidney disease: Secondary | ICD-10-CM | POA: Diagnosis not present

## 2022-03-18 DIAGNOSIS — Z992 Dependence on renal dialysis: Secondary | ICD-10-CM | POA: Diagnosis not present

## 2022-03-18 DIAGNOSIS — N186 End stage renal disease: Secondary | ICD-10-CM | POA: Diagnosis not present

## 2022-03-18 DIAGNOSIS — N2581 Secondary hyperparathyroidism of renal origin: Secondary | ICD-10-CM | POA: Diagnosis not present

## 2022-03-19 DIAGNOSIS — D631 Anemia in chronic kidney disease: Secondary | ICD-10-CM | POA: Diagnosis not present

## 2022-03-19 DIAGNOSIS — N2581 Secondary hyperparathyroidism of renal origin: Secondary | ICD-10-CM | POA: Diagnosis not present

## 2022-03-19 DIAGNOSIS — D509 Iron deficiency anemia, unspecified: Secondary | ICD-10-CM | POA: Diagnosis not present

## 2022-03-19 DIAGNOSIS — N186 End stage renal disease: Secondary | ICD-10-CM | POA: Diagnosis not present

## 2022-03-19 DIAGNOSIS — Z992 Dependence on renal dialysis: Secondary | ICD-10-CM | POA: Diagnosis not present

## 2022-03-19 DIAGNOSIS — Z4932 Encounter for adequacy testing for peritoneal dialysis: Secondary | ICD-10-CM | POA: Diagnosis not present

## 2022-03-20 DIAGNOSIS — N186 End stage renal disease: Secondary | ICD-10-CM | POA: Diagnosis not present

## 2022-03-20 DIAGNOSIS — Z992 Dependence on renal dialysis: Secondary | ICD-10-CM | POA: Diagnosis not present

## 2022-03-20 DIAGNOSIS — N2581 Secondary hyperparathyroidism of renal origin: Secondary | ICD-10-CM | POA: Diagnosis not present

## 2022-03-20 DIAGNOSIS — Z4932 Encounter for adequacy testing for peritoneal dialysis: Secondary | ICD-10-CM | POA: Diagnosis not present

## 2022-03-20 DIAGNOSIS — D509 Iron deficiency anemia, unspecified: Secondary | ICD-10-CM | POA: Diagnosis not present

## 2022-03-20 DIAGNOSIS — D631 Anemia in chronic kidney disease: Secondary | ICD-10-CM | POA: Diagnosis not present

## 2022-03-21 DIAGNOSIS — N186 End stage renal disease: Secondary | ICD-10-CM | POA: Diagnosis not present

## 2022-03-21 DIAGNOSIS — Z4932 Encounter for adequacy testing for peritoneal dialysis: Secondary | ICD-10-CM | POA: Diagnosis not present

## 2022-03-21 DIAGNOSIS — D631 Anemia in chronic kidney disease: Secondary | ICD-10-CM | POA: Diagnosis not present

## 2022-03-21 DIAGNOSIS — D509 Iron deficiency anemia, unspecified: Secondary | ICD-10-CM | POA: Diagnosis not present

## 2022-03-21 DIAGNOSIS — Z992 Dependence on renal dialysis: Secondary | ICD-10-CM | POA: Diagnosis not present

## 2022-03-21 DIAGNOSIS — N2581 Secondary hyperparathyroidism of renal origin: Secondary | ICD-10-CM | POA: Diagnosis not present

## 2022-03-22 DIAGNOSIS — Z992 Dependence on renal dialysis: Secondary | ICD-10-CM | POA: Diagnosis not present

## 2022-03-22 DIAGNOSIS — D509 Iron deficiency anemia, unspecified: Secondary | ICD-10-CM | POA: Diagnosis not present

## 2022-03-22 DIAGNOSIS — D631 Anemia in chronic kidney disease: Secondary | ICD-10-CM | POA: Diagnosis not present

## 2022-03-22 DIAGNOSIS — N2581 Secondary hyperparathyroidism of renal origin: Secondary | ICD-10-CM | POA: Diagnosis not present

## 2022-03-22 DIAGNOSIS — N186 End stage renal disease: Secondary | ICD-10-CM | POA: Diagnosis not present

## 2022-03-22 DIAGNOSIS — Z4932 Encounter for adequacy testing for peritoneal dialysis: Secondary | ICD-10-CM | POA: Diagnosis not present

## 2022-03-23 DIAGNOSIS — N2581 Secondary hyperparathyroidism of renal origin: Secondary | ICD-10-CM | POA: Diagnosis not present

## 2022-03-23 DIAGNOSIS — Z992 Dependence on renal dialysis: Secondary | ICD-10-CM | POA: Diagnosis not present

## 2022-03-23 DIAGNOSIS — D631 Anemia in chronic kidney disease: Secondary | ICD-10-CM | POA: Diagnosis not present

## 2022-03-23 DIAGNOSIS — D509 Iron deficiency anemia, unspecified: Secondary | ICD-10-CM | POA: Diagnosis not present

## 2022-03-23 DIAGNOSIS — Z4932 Encounter for adequacy testing for peritoneal dialysis: Secondary | ICD-10-CM | POA: Diagnosis not present

## 2022-03-23 DIAGNOSIS — N186 End stage renal disease: Secondary | ICD-10-CM | POA: Diagnosis not present

## 2022-03-24 DIAGNOSIS — Z4932 Encounter for adequacy testing for peritoneal dialysis: Secondary | ICD-10-CM | POA: Diagnosis not present

## 2022-03-24 DIAGNOSIS — Z992 Dependence on renal dialysis: Secondary | ICD-10-CM | POA: Diagnosis not present

## 2022-03-24 DIAGNOSIS — D509 Iron deficiency anemia, unspecified: Secondary | ICD-10-CM | POA: Diagnosis not present

## 2022-03-24 DIAGNOSIS — N2581 Secondary hyperparathyroidism of renal origin: Secondary | ICD-10-CM | POA: Diagnosis not present

## 2022-03-24 DIAGNOSIS — N186 End stage renal disease: Secondary | ICD-10-CM | POA: Diagnosis not present

## 2022-03-24 DIAGNOSIS — D631 Anemia in chronic kidney disease: Secondary | ICD-10-CM | POA: Diagnosis not present

## 2022-03-25 DIAGNOSIS — Z4932 Encounter for adequacy testing for peritoneal dialysis: Secondary | ICD-10-CM | POA: Diagnosis not present

## 2022-03-25 DIAGNOSIS — D631 Anemia in chronic kidney disease: Secondary | ICD-10-CM | POA: Diagnosis not present

## 2022-03-25 DIAGNOSIS — N2581 Secondary hyperparathyroidism of renal origin: Secondary | ICD-10-CM | POA: Diagnosis not present

## 2022-03-25 DIAGNOSIS — Z992 Dependence on renal dialysis: Secondary | ICD-10-CM | POA: Diagnosis not present

## 2022-03-25 DIAGNOSIS — D509 Iron deficiency anemia, unspecified: Secondary | ICD-10-CM | POA: Diagnosis not present

## 2022-03-25 DIAGNOSIS — N186 End stage renal disease: Secondary | ICD-10-CM | POA: Diagnosis not present

## 2022-03-26 DIAGNOSIS — D631 Anemia in chronic kidney disease: Secondary | ICD-10-CM | POA: Diagnosis not present

## 2022-03-26 DIAGNOSIS — Z992 Dependence on renal dialysis: Secondary | ICD-10-CM | POA: Diagnosis not present

## 2022-03-26 DIAGNOSIS — D509 Iron deficiency anemia, unspecified: Secondary | ICD-10-CM | POA: Diagnosis not present

## 2022-03-26 DIAGNOSIS — Z4932 Encounter for adequacy testing for peritoneal dialysis: Secondary | ICD-10-CM | POA: Diagnosis not present

## 2022-03-26 DIAGNOSIS — N186 End stage renal disease: Secondary | ICD-10-CM | POA: Diagnosis not present

## 2022-03-26 DIAGNOSIS — N2581 Secondary hyperparathyroidism of renal origin: Secondary | ICD-10-CM | POA: Diagnosis not present

## 2022-03-27 DIAGNOSIS — N186 End stage renal disease: Secondary | ICD-10-CM | POA: Diagnosis not present

## 2022-03-27 DIAGNOSIS — D509 Iron deficiency anemia, unspecified: Secondary | ICD-10-CM | POA: Diagnosis not present

## 2022-03-27 DIAGNOSIS — Z992 Dependence on renal dialysis: Secondary | ICD-10-CM | POA: Diagnosis not present

## 2022-03-27 DIAGNOSIS — Z4932 Encounter for adequacy testing for peritoneal dialysis: Secondary | ICD-10-CM | POA: Diagnosis not present

## 2022-03-27 DIAGNOSIS — N2581 Secondary hyperparathyroidism of renal origin: Secondary | ICD-10-CM | POA: Diagnosis not present

## 2022-03-27 DIAGNOSIS — D631 Anemia in chronic kidney disease: Secondary | ICD-10-CM | POA: Diagnosis not present

## 2022-03-28 DIAGNOSIS — D631 Anemia in chronic kidney disease: Secondary | ICD-10-CM | POA: Diagnosis not present

## 2022-03-28 DIAGNOSIS — Z4932 Encounter for adequacy testing for peritoneal dialysis: Secondary | ICD-10-CM | POA: Diagnosis not present

## 2022-03-28 DIAGNOSIS — D509 Iron deficiency anemia, unspecified: Secondary | ICD-10-CM | POA: Diagnosis not present

## 2022-03-28 DIAGNOSIS — N186 End stage renal disease: Secondary | ICD-10-CM | POA: Diagnosis not present

## 2022-03-28 DIAGNOSIS — N2581 Secondary hyperparathyroidism of renal origin: Secondary | ICD-10-CM | POA: Diagnosis not present

## 2022-03-28 DIAGNOSIS — Z992 Dependence on renal dialysis: Secondary | ICD-10-CM | POA: Diagnosis not present

## 2022-03-29 DIAGNOSIS — Z992 Dependence on renal dialysis: Secondary | ICD-10-CM | POA: Diagnosis not present

## 2022-03-29 DIAGNOSIS — D631 Anemia in chronic kidney disease: Secondary | ICD-10-CM | POA: Diagnosis not present

## 2022-03-29 DIAGNOSIS — D509 Iron deficiency anemia, unspecified: Secondary | ICD-10-CM | POA: Diagnosis not present

## 2022-03-29 DIAGNOSIS — Z4932 Encounter for adequacy testing for peritoneal dialysis: Secondary | ICD-10-CM | POA: Diagnosis not present

## 2022-03-29 DIAGNOSIS — N186 End stage renal disease: Secondary | ICD-10-CM | POA: Diagnosis not present

## 2022-03-29 DIAGNOSIS — N2581 Secondary hyperparathyroidism of renal origin: Secondary | ICD-10-CM | POA: Diagnosis not present

## 2022-03-30 DIAGNOSIS — D509 Iron deficiency anemia, unspecified: Secondary | ICD-10-CM | POA: Diagnosis not present

## 2022-03-30 DIAGNOSIS — N2581 Secondary hyperparathyroidism of renal origin: Secondary | ICD-10-CM | POA: Diagnosis not present

## 2022-03-30 DIAGNOSIS — Z992 Dependence on renal dialysis: Secondary | ICD-10-CM | POA: Diagnosis not present

## 2022-03-30 DIAGNOSIS — D631 Anemia in chronic kidney disease: Secondary | ICD-10-CM | POA: Diagnosis not present

## 2022-03-30 DIAGNOSIS — Z4932 Encounter for adequacy testing for peritoneal dialysis: Secondary | ICD-10-CM | POA: Diagnosis not present

## 2022-03-30 DIAGNOSIS — N186 End stage renal disease: Secondary | ICD-10-CM | POA: Diagnosis not present

## 2022-03-31 DIAGNOSIS — Z992 Dependence on renal dialysis: Secondary | ICD-10-CM | POA: Diagnosis not present

## 2022-03-31 DIAGNOSIS — D631 Anemia in chronic kidney disease: Secondary | ICD-10-CM | POA: Diagnosis not present

## 2022-03-31 DIAGNOSIS — N186 End stage renal disease: Secondary | ICD-10-CM | POA: Diagnosis not present

## 2022-03-31 DIAGNOSIS — D509 Iron deficiency anemia, unspecified: Secondary | ICD-10-CM | POA: Diagnosis not present

## 2022-03-31 DIAGNOSIS — Z4932 Encounter for adequacy testing for peritoneal dialysis: Secondary | ICD-10-CM | POA: Diagnosis not present

## 2022-03-31 DIAGNOSIS — N2581 Secondary hyperparathyroidism of renal origin: Secondary | ICD-10-CM | POA: Diagnosis not present

## 2022-04-01 DIAGNOSIS — D509 Iron deficiency anemia, unspecified: Secondary | ICD-10-CM | POA: Diagnosis not present

## 2022-04-01 DIAGNOSIS — N2581 Secondary hyperparathyroidism of renal origin: Secondary | ICD-10-CM | POA: Diagnosis not present

## 2022-04-01 DIAGNOSIS — Z4932 Encounter for adequacy testing for peritoneal dialysis: Secondary | ICD-10-CM | POA: Diagnosis not present

## 2022-04-01 DIAGNOSIS — N186 End stage renal disease: Secondary | ICD-10-CM | POA: Diagnosis not present

## 2022-04-01 DIAGNOSIS — D631 Anemia in chronic kidney disease: Secondary | ICD-10-CM | POA: Diagnosis not present

## 2022-04-01 DIAGNOSIS — Z992 Dependence on renal dialysis: Secondary | ICD-10-CM | POA: Diagnosis not present

## 2022-04-02 DIAGNOSIS — N186 End stage renal disease: Secondary | ICD-10-CM | POA: Diagnosis not present

## 2022-04-02 DIAGNOSIS — D631 Anemia in chronic kidney disease: Secondary | ICD-10-CM | POA: Diagnosis not present

## 2022-04-02 DIAGNOSIS — Z4932 Encounter for adequacy testing for peritoneal dialysis: Secondary | ICD-10-CM | POA: Diagnosis not present

## 2022-04-02 DIAGNOSIS — D509 Iron deficiency anemia, unspecified: Secondary | ICD-10-CM | POA: Diagnosis not present

## 2022-04-02 DIAGNOSIS — Z992 Dependence on renal dialysis: Secondary | ICD-10-CM | POA: Diagnosis not present

## 2022-04-02 DIAGNOSIS — N2581 Secondary hyperparathyroidism of renal origin: Secondary | ICD-10-CM | POA: Diagnosis not present

## 2022-04-03 DIAGNOSIS — Z992 Dependence on renal dialysis: Secondary | ICD-10-CM | POA: Diagnosis not present

## 2022-04-03 DIAGNOSIS — Z4932 Encounter for adequacy testing for peritoneal dialysis: Secondary | ICD-10-CM | POA: Diagnosis not present

## 2022-04-03 DIAGNOSIS — N186 End stage renal disease: Secondary | ICD-10-CM | POA: Diagnosis not present

## 2022-04-03 DIAGNOSIS — D631 Anemia in chronic kidney disease: Secondary | ICD-10-CM | POA: Diagnosis not present

## 2022-04-03 DIAGNOSIS — N2581 Secondary hyperparathyroidism of renal origin: Secondary | ICD-10-CM | POA: Diagnosis not present

## 2022-04-03 DIAGNOSIS — D509 Iron deficiency anemia, unspecified: Secondary | ICD-10-CM | POA: Diagnosis not present

## 2022-04-04 DIAGNOSIS — D631 Anemia in chronic kidney disease: Secondary | ICD-10-CM | POA: Diagnosis not present

## 2022-04-04 DIAGNOSIS — N2581 Secondary hyperparathyroidism of renal origin: Secondary | ICD-10-CM | POA: Diagnosis not present

## 2022-04-04 DIAGNOSIS — Z992 Dependence on renal dialysis: Secondary | ICD-10-CM | POA: Diagnosis not present

## 2022-04-04 DIAGNOSIS — Z4932 Encounter for adequacy testing for peritoneal dialysis: Secondary | ICD-10-CM | POA: Diagnosis not present

## 2022-04-04 DIAGNOSIS — D509 Iron deficiency anemia, unspecified: Secondary | ICD-10-CM | POA: Diagnosis not present

## 2022-04-04 DIAGNOSIS — N186 End stage renal disease: Secondary | ICD-10-CM | POA: Diagnosis not present

## 2022-04-05 DIAGNOSIS — D631 Anemia in chronic kidney disease: Secondary | ICD-10-CM | POA: Diagnosis not present

## 2022-04-05 DIAGNOSIS — Z992 Dependence on renal dialysis: Secondary | ICD-10-CM | POA: Diagnosis not present

## 2022-04-05 DIAGNOSIS — N186 End stage renal disease: Secondary | ICD-10-CM | POA: Diagnosis not present

## 2022-04-05 DIAGNOSIS — N2581 Secondary hyperparathyroidism of renal origin: Secondary | ICD-10-CM | POA: Diagnosis not present

## 2022-04-05 DIAGNOSIS — D509 Iron deficiency anemia, unspecified: Secondary | ICD-10-CM | POA: Diagnosis not present

## 2022-04-05 DIAGNOSIS — Z4932 Encounter for adequacy testing for peritoneal dialysis: Secondary | ICD-10-CM | POA: Diagnosis not present

## 2022-04-06 DIAGNOSIS — N186 End stage renal disease: Secondary | ICD-10-CM | POA: Diagnosis not present

## 2022-04-06 DIAGNOSIS — D631 Anemia in chronic kidney disease: Secondary | ICD-10-CM | POA: Diagnosis not present

## 2022-04-06 DIAGNOSIS — N2581 Secondary hyperparathyroidism of renal origin: Secondary | ICD-10-CM | POA: Diagnosis not present

## 2022-04-06 DIAGNOSIS — Z992 Dependence on renal dialysis: Secondary | ICD-10-CM | POA: Diagnosis not present

## 2022-04-06 DIAGNOSIS — Z4932 Encounter for adequacy testing for peritoneal dialysis: Secondary | ICD-10-CM | POA: Diagnosis not present

## 2022-04-06 DIAGNOSIS — D509 Iron deficiency anemia, unspecified: Secondary | ICD-10-CM | POA: Diagnosis not present

## 2022-04-07 DIAGNOSIS — N186 End stage renal disease: Secondary | ICD-10-CM | POA: Diagnosis not present

## 2022-04-07 DIAGNOSIS — N2581 Secondary hyperparathyroidism of renal origin: Secondary | ICD-10-CM | POA: Diagnosis not present

## 2022-04-07 DIAGNOSIS — D631 Anemia in chronic kidney disease: Secondary | ICD-10-CM | POA: Diagnosis not present

## 2022-04-07 DIAGNOSIS — Z992 Dependence on renal dialysis: Secondary | ICD-10-CM | POA: Diagnosis not present

## 2022-04-07 DIAGNOSIS — D509 Iron deficiency anemia, unspecified: Secondary | ICD-10-CM | POA: Diagnosis not present

## 2022-04-07 DIAGNOSIS — Z4932 Encounter for adequacy testing for peritoneal dialysis: Secondary | ICD-10-CM | POA: Diagnosis not present

## 2022-04-08 DIAGNOSIS — D509 Iron deficiency anemia, unspecified: Secondary | ICD-10-CM | POA: Diagnosis not present

## 2022-04-08 DIAGNOSIS — Z992 Dependence on renal dialysis: Secondary | ICD-10-CM | POA: Diagnosis not present

## 2022-04-08 DIAGNOSIS — D631 Anemia in chronic kidney disease: Secondary | ICD-10-CM | POA: Diagnosis not present

## 2022-04-08 DIAGNOSIS — N186 End stage renal disease: Secondary | ICD-10-CM | POA: Diagnosis not present

## 2022-04-08 DIAGNOSIS — N2581 Secondary hyperparathyroidism of renal origin: Secondary | ICD-10-CM | POA: Diagnosis not present

## 2022-04-08 DIAGNOSIS — Z4932 Encounter for adequacy testing for peritoneal dialysis: Secondary | ICD-10-CM | POA: Diagnosis not present

## 2022-04-09 DIAGNOSIS — Z4932 Encounter for adequacy testing for peritoneal dialysis: Secondary | ICD-10-CM | POA: Diagnosis not present

## 2022-04-09 DIAGNOSIS — N186 End stage renal disease: Secondary | ICD-10-CM | POA: Diagnosis not present

## 2022-04-09 DIAGNOSIS — N2581 Secondary hyperparathyroidism of renal origin: Secondary | ICD-10-CM | POA: Diagnosis not present

## 2022-04-09 DIAGNOSIS — Z992 Dependence on renal dialysis: Secondary | ICD-10-CM | POA: Diagnosis not present

## 2022-04-09 DIAGNOSIS — D509 Iron deficiency anemia, unspecified: Secondary | ICD-10-CM | POA: Diagnosis not present

## 2022-04-09 DIAGNOSIS — D631 Anemia in chronic kidney disease: Secondary | ICD-10-CM | POA: Diagnosis not present

## 2022-04-10 DIAGNOSIS — Z992 Dependence on renal dialysis: Secondary | ICD-10-CM | POA: Diagnosis not present

## 2022-04-10 DIAGNOSIS — D509 Iron deficiency anemia, unspecified: Secondary | ICD-10-CM | POA: Diagnosis not present

## 2022-04-10 DIAGNOSIS — N186 End stage renal disease: Secondary | ICD-10-CM | POA: Diagnosis not present

## 2022-04-10 DIAGNOSIS — Z4932 Encounter for adequacy testing for peritoneal dialysis: Secondary | ICD-10-CM | POA: Diagnosis not present

## 2022-04-10 DIAGNOSIS — D631 Anemia in chronic kidney disease: Secondary | ICD-10-CM | POA: Diagnosis not present

## 2022-04-10 DIAGNOSIS — N2581 Secondary hyperparathyroidism of renal origin: Secondary | ICD-10-CM | POA: Diagnosis not present

## 2022-04-11 DIAGNOSIS — D509 Iron deficiency anemia, unspecified: Secondary | ICD-10-CM | POA: Diagnosis not present

## 2022-04-11 DIAGNOSIS — I129 Hypertensive chronic kidney disease with stage 1 through stage 4 chronic kidney disease, or unspecified chronic kidney disease: Secondary | ICD-10-CM | POA: Diagnosis not present

## 2022-04-11 DIAGNOSIS — D631 Anemia in chronic kidney disease: Secondary | ICD-10-CM | POA: Diagnosis not present

## 2022-04-11 DIAGNOSIS — N186 End stage renal disease: Secondary | ICD-10-CM | POA: Diagnosis not present

## 2022-04-11 DIAGNOSIS — Z992 Dependence on renal dialysis: Secondary | ICD-10-CM | POA: Diagnosis not present

## 2022-04-11 DIAGNOSIS — N2581 Secondary hyperparathyroidism of renal origin: Secondary | ICD-10-CM | POA: Diagnosis not present

## 2022-04-11 DIAGNOSIS — Z4932 Encounter for adequacy testing for peritoneal dialysis: Secondary | ICD-10-CM | POA: Diagnosis not present

## 2022-04-12 DIAGNOSIS — K769 Liver disease, unspecified: Secondary | ICD-10-CM | POA: Diagnosis not present

## 2022-04-12 DIAGNOSIS — D631 Anemia in chronic kidney disease: Secondary | ICD-10-CM | POA: Diagnosis not present

## 2022-04-12 DIAGNOSIS — Z992 Dependence on renal dialysis: Secondary | ICD-10-CM | POA: Diagnosis not present

## 2022-04-12 DIAGNOSIS — N2581 Secondary hyperparathyroidism of renal origin: Secondary | ICD-10-CM | POA: Diagnosis not present

## 2022-04-12 DIAGNOSIS — N186 End stage renal disease: Secondary | ICD-10-CM | POA: Diagnosis not present

## 2022-04-12 DIAGNOSIS — R8290 Unspecified abnormal findings in urine: Secondary | ICD-10-CM | POA: Diagnosis not present

## 2022-04-12 DIAGNOSIS — Z4932 Encounter for adequacy testing for peritoneal dialysis: Secondary | ICD-10-CM | POA: Diagnosis not present

## 2022-04-13 DIAGNOSIS — Z4932 Encounter for adequacy testing for peritoneal dialysis: Secondary | ICD-10-CM | POA: Diagnosis not present

## 2022-04-13 DIAGNOSIS — N2581 Secondary hyperparathyroidism of renal origin: Secondary | ICD-10-CM | POA: Diagnosis not present

## 2022-04-13 DIAGNOSIS — D631 Anemia in chronic kidney disease: Secondary | ICD-10-CM | POA: Diagnosis not present

## 2022-04-13 DIAGNOSIS — R8290 Unspecified abnormal findings in urine: Secondary | ICD-10-CM | POA: Diagnosis not present

## 2022-04-13 DIAGNOSIS — Z992 Dependence on renal dialysis: Secondary | ICD-10-CM | POA: Diagnosis not present

## 2022-04-13 DIAGNOSIS — K769 Liver disease, unspecified: Secondary | ICD-10-CM | POA: Diagnosis not present

## 2022-04-13 DIAGNOSIS — N186 End stage renal disease: Secondary | ICD-10-CM | POA: Diagnosis not present

## 2022-04-14 DIAGNOSIS — Z992 Dependence on renal dialysis: Secondary | ICD-10-CM | POA: Diagnosis not present

## 2022-04-14 DIAGNOSIS — N2581 Secondary hyperparathyroidism of renal origin: Secondary | ICD-10-CM | POA: Diagnosis not present

## 2022-04-14 DIAGNOSIS — Z4932 Encounter for adequacy testing for peritoneal dialysis: Secondary | ICD-10-CM | POA: Diagnosis not present

## 2022-04-14 DIAGNOSIS — K769 Liver disease, unspecified: Secondary | ICD-10-CM | POA: Diagnosis not present

## 2022-04-14 DIAGNOSIS — N186 End stage renal disease: Secondary | ICD-10-CM | POA: Diagnosis not present

## 2022-04-14 DIAGNOSIS — D631 Anemia in chronic kidney disease: Secondary | ICD-10-CM | POA: Diagnosis not present

## 2022-04-14 DIAGNOSIS — R8290 Unspecified abnormal findings in urine: Secondary | ICD-10-CM | POA: Diagnosis not present

## 2022-04-15 DIAGNOSIS — R8290 Unspecified abnormal findings in urine: Secondary | ICD-10-CM | POA: Diagnosis not present

## 2022-04-15 DIAGNOSIS — K769 Liver disease, unspecified: Secondary | ICD-10-CM | POA: Diagnosis not present

## 2022-04-15 DIAGNOSIS — Z992 Dependence on renal dialysis: Secondary | ICD-10-CM | POA: Diagnosis not present

## 2022-04-15 DIAGNOSIS — D631 Anemia in chronic kidney disease: Secondary | ICD-10-CM | POA: Diagnosis not present

## 2022-04-15 DIAGNOSIS — N2581 Secondary hyperparathyroidism of renal origin: Secondary | ICD-10-CM | POA: Diagnosis not present

## 2022-04-15 DIAGNOSIS — N186 End stage renal disease: Secondary | ICD-10-CM | POA: Diagnosis not present

## 2022-04-15 DIAGNOSIS — Z4932 Encounter for adequacy testing for peritoneal dialysis: Secondary | ICD-10-CM | POA: Diagnosis not present

## 2022-04-16 DIAGNOSIS — N2581 Secondary hyperparathyroidism of renal origin: Secondary | ICD-10-CM | POA: Diagnosis not present

## 2022-04-16 DIAGNOSIS — R8290 Unspecified abnormal findings in urine: Secondary | ICD-10-CM | POA: Diagnosis not present

## 2022-04-16 DIAGNOSIS — Z4932 Encounter for adequacy testing for peritoneal dialysis: Secondary | ICD-10-CM | POA: Diagnosis not present

## 2022-04-16 DIAGNOSIS — N186 End stage renal disease: Secondary | ICD-10-CM | POA: Diagnosis not present

## 2022-04-16 DIAGNOSIS — K769 Liver disease, unspecified: Secondary | ICD-10-CM | POA: Diagnosis not present

## 2022-04-16 DIAGNOSIS — D631 Anemia in chronic kidney disease: Secondary | ICD-10-CM | POA: Diagnosis not present

## 2022-04-16 DIAGNOSIS — Z992 Dependence on renal dialysis: Secondary | ICD-10-CM | POA: Diagnosis not present

## 2022-04-17 DIAGNOSIS — D631 Anemia in chronic kidney disease: Secondary | ICD-10-CM | POA: Diagnosis not present

## 2022-04-17 DIAGNOSIS — Z4932 Encounter for adequacy testing for peritoneal dialysis: Secondary | ICD-10-CM | POA: Diagnosis not present

## 2022-04-17 DIAGNOSIS — N186 End stage renal disease: Secondary | ICD-10-CM | POA: Diagnosis not present

## 2022-04-17 DIAGNOSIS — Z992 Dependence on renal dialysis: Secondary | ICD-10-CM | POA: Diagnosis not present

## 2022-04-17 DIAGNOSIS — N2581 Secondary hyperparathyroidism of renal origin: Secondary | ICD-10-CM | POA: Diagnosis not present

## 2022-04-17 DIAGNOSIS — K769 Liver disease, unspecified: Secondary | ICD-10-CM | POA: Diagnosis not present

## 2022-04-17 DIAGNOSIS — R8290 Unspecified abnormal findings in urine: Secondary | ICD-10-CM | POA: Diagnosis not present

## 2022-04-18 DIAGNOSIS — N2581 Secondary hyperparathyroidism of renal origin: Secondary | ICD-10-CM | POA: Diagnosis not present

## 2022-04-18 DIAGNOSIS — N186 End stage renal disease: Secondary | ICD-10-CM | POA: Diagnosis not present

## 2022-04-18 DIAGNOSIS — R8290 Unspecified abnormal findings in urine: Secondary | ICD-10-CM | POA: Diagnosis not present

## 2022-04-18 DIAGNOSIS — D631 Anemia in chronic kidney disease: Secondary | ICD-10-CM | POA: Diagnosis not present

## 2022-04-18 DIAGNOSIS — Z4932 Encounter for adequacy testing for peritoneal dialysis: Secondary | ICD-10-CM | POA: Diagnosis not present

## 2022-04-18 DIAGNOSIS — K769 Liver disease, unspecified: Secondary | ICD-10-CM | POA: Diagnosis not present

## 2022-04-18 DIAGNOSIS — Z992 Dependence on renal dialysis: Secondary | ICD-10-CM | POA: Diagnosis not present

## 2022-04-19 DIAGNOSIS — Z992 Dependence on renal dialysis: Secondary | ICD-10-CM | POA: Diagnosis not present

## 2022-04-19 DIAGNOSIS — N186 End stage renal disease: Secondary | ICD-10-CM | POA: Diagnosis not present

## 2022-04-19 DIAGNOSIS — K769 Liver disease, unspecified: Secondary | ICD-10-CM | POA: Diagnosis not present

## 2022-04-19 DIAGNOSIS — R8290 Unspecified abnormal findings in urine: Secondary | ICD-10-CM | POA: Diagnosis not present

## 2022-04-19 DIAGNOSIS — N2581 Secondary hyperparathyroidism of renal origin: Secondary | ICD-10-CM | POA: Diagnosis not present

## 2022-04-19 DIAGNOSIS — Z4932 Encounter for adequacy testing for peritoneal dialysis: Secondary | ICD-10-CM | POA: Diagnosis not present

## 2022-04-19 DIAGNOSIS — D631 Anemia in chronic kidney disease: Secondary | ICD-10-CM | POA: Diagnosis not present

## 2022-04-20 DIAGNOSIS — N186 End stage renal disease: Secondary | ICD-10-CM | POA: Diagnosis not present

## 2022-04-20 DIAGNOSIS — Z4932 Encounter for adequacy testing for peritoneal dialysis: Secondary | ICD-10-CM | POA: Diagnosis not present

## 2022-04-20 DIAGNOSIS — Z992 Dependence on renal dialysis: Secondary | ICD-10-CM | POA: Diagnosis not present

## 2022-04-20 DIAGNOSIS — K769 Liver disease, unspecified: Secondary | ICD-10-CM | POA: Diagnosis not present

## 2022-04-20 DIAGNOSIS — R8290 Unspecified abnormal findings in urine: Secondary | ICD-10-CM | POA: Diagnosis not present

## 2022-04-20 DIAGNOSIS — D631 Anemia in chronic kidney disease: Secondary | ICD-10-CM | POA: Diagnosis not present

## 2022-04-20 DIAGNOSIS — N2581 Secondary hyperparathyroidism of renal origin: Secondary | ICD-10-CM | POA: Diagnosis not present

## 2022-04-21 DIAGNOSIS — K769 Liver disease, unspecified: Secondary | ICD-10-CM | POA: Diagnosis not present

## 2022-04-21 DIAGNOSIS — N2581 Secondary hyperparathyroidism of renal origin: Secondary | ICD-10-CM | POA: Diagnosis not present

## 2022-04-21 DIAGNOSIS — D631 Anemia in chronic kidney disease: Secondary | ICD-10-CM | POA: Diagnosis not present

## 2022-04-21 DIAGNOSIS — R8290 Unspecified abnormal findings in urine: Secondary | ICD-10-CM | POA: Diagnosis not present

## 2022-04-21 DIAGNOSIS — N186 End stage renal disease: Secondary | ICD-10-CM | POA: Diagnosis not present

## 2022-04-21 DIAGNOSIS — Z992 Dependence on renal dialysis: Secondary | ICD-10-CM | POA: Diagnosis not present

## 2022-04-21 DIAGNOSIS — Z4932 Encounter for adequacy testing for peritoneal dialysis: Secondary | ICD-10-CM | POA: Diagnosis not present

## 2022-04-22 DIAGNOSIS — N186 End stage renal disease: Secondary | ICD-10-CM | POA: Diagnosis not present

## 2022-04-22 DIAGNOSIS — N2581 Secondary hyperparathyroidism of renal origin: Secondary | ICD-10-CM | POA: Diagnosis not present

## 2022-04-22 DIAGNOSIS — Z4932 Encounter for adequacy testing for peritoneal dialysis: Secondary | ICD-10-CM | POA: Diagnosis not present

## 2022-04-22 DIAGNOSIS — R8290 Unspecified abnormal findings in urine: Secondary | ICD-10-CM | POA: Diagnosis not present

## 2022-04-22 DIAGNOSIS — D631 Anemia in chronic kidney disease: Secondary | ICD-10-CM | POA: Diagnosis not present

## 2022-04-22 DIAGNOSIS — K769 Liver disease, unspecified: Secondary | ICD-10-CM | POA: Diagnosis not present

## 2022-04-22 DIAGNOSIS — Z992 Dependence on renal dialysis: Secondary | ICD-10-CM | POA: Diagnosis not present

## 2022-04-23 DIAGNOSIS — D631 Anemia in chronic kidney disease: Secondary | ICD-10-CM | POA: Diagnosis not present

## 2022-04-23 DIAGNOSIS — N2581 Secondary hyperparathyroidism of renal origin: Secondary | ICD-10-CM | POA: Diagnosis not present

## 2022-04-23 DIAGNOSIS — Z4932 Encounter for adequacy testing for peritoneal dialysis: Secondary | ICD-10-CM | POA: Diagnosis not present

## 2022-04-23 DIAGNOSIS — Z992 Dependence on renal dialysis: Secondary | ICD-10-CM | POA: Diagnosis not present

## 2022-04-23 DIAGNOSIS — R8290 Unspecified abnormal findings in urine: Secondary | ICD-10-CM | POA: Diagnosis not present

## 2022-04-23 DIAGNOSIS — N186 End stage renal disease: Secondary | ICD-10-CM | POA: Diagnosis not present

## 2022-04-23 DIAGNOSIS — K769 Liver disease, unspecified: Secondary | ICD-10-CM | POA: Diagnosis not present

## 2022-04-24 DIAGNOSIS — R8290 Unspecified abnormal findings in urine: Secondary | ICD-10-CM | POA: Diagnosis not present

## 2022-04-24 DIAGNOSIS — N186 End stage renal disease: Secondary | ICD-10-CM | POA: Diagnosis not present

## 2022-04-24 DIAGNOSIS — Z992 Dependence on renal dialysis: Secondary | ICD-10-CM | POA: Diagnosis not present

## 2022-04-24 DIAGNOSIS — K769 Liver disease, unspecified: Secondary | ICD-10-CM | POA: Diagnosis not present

## 2022-04-24 DIAGNOSIS — D631 Anemia in chronic kidney disease: Secondary | ICD-10-CM | POA: Diagnosis not present

## 2022-04-24 DIAGNOSIS — N2581 Secondary hyperparathyroidism of renal origin: Secondary | ICD-10-CM | POA: Diagnosis not present

## 2022-04-24 DIAGNOSIS — Z4932 Encounter for adequacy testing for peritoneal dialysis: Secondary | ICD-10-CM | POA: Diagnosis not present

## 2022-04-25 DIAGNOSIS — R8290 Unspecified abnormal findings in urine: Secondary | ICD-10-CM | POA: Diagnosis not present

## 2022-04-25 DIAGNOSIS — Z992 Dependence on renal dialysis: Secondary | ICD-10-CM | POA: Diagnosis not present

## 2022-04-25 DIAGNOSIS — D631 Anemia in chronic kidney disease: Secondary | ICD-10-CM | POA: Diagnosis not present

## 2022-04-25 DIAGNOSIS — K769 Liver disease, unspecified: Secondary | ICD-10-CM | POA: Diagnosis not present

## 2022-04-25 DIAGNOSIS — N186 End stage renal disease: Secondary | ICD-10-CM | POA: Diagnosis not present

## 2022-04-25 DIAGNOSIS — N2581 Secondary hyperparathyroidism of renal origin: Secondary | ICD-10-CM | POA: Diagnosis not present

## 2022-04-25 DIAGNOSIS — Z4932 Encounter for adequacy testing for peritoneal dialysis: Secondary | ICD-10-CM | POA: Diagnosis not present

## 2022-04-26 DIAGNOSIS — N2581 Secondary hyperparathyroidism of renal origin: Secondary | ICD-10-CM | POA: Diagnosis not present

## 2022-04-26 DIAGNOSIS — R8290 Unspecified abnormal findings in urine: Secondary | ICD-10-CM | POA: Diagnosis not present

## 2022-04-26 DIAGNOSIS — N186 End stage renal disease: Secondary | ICD-10-CM | POA: Diagnosis not present

## 2022-04-26 DIAGNOSIS — D631 Anemia in chronic kidney disease: Secondary | ICD-10-CM | POA: Diagnosis not present

## 2022-04-26 DIAGNOSIS — Z992 Dependence on renal dialysis: Secondary | ICD-10-CM | POA: Diagnosis not present

## 2022-04-26 DIAGNOSIS — Z4932 Encounter for adequacy testing for peritoneal dialysis: Secondary | ICD-10-CM | POA: Diagnosis not present

## 2022-04-26 DIAGNOSIS — K769 Liver disease, unspecified: Secondary | ICD-10-CM | POA: Diagnosis not present

## 2022-04-27 DIAGNOSIS — R8290 Unspecified abnormal findings in urine: Secondary | ICD-10-CM | POA: Diagnosis not present

## 2022-04-27 DIAGNOSIS — Z4932 Encounter for adequacy testing for peritoneal dialysis: Secondary | ICD-10-CM | POA: Diagnosis not present

## 2022-04-27 DIAGNOSIS — Z992 Dependence on renal dialysis: Secondary | ICD-10-CM | POA: Diagnosis not present

## 2022-04-27 DIAGNOSIS — N186 End stage renal disease: Secondary | ICD-10-CM | POA: Diagnosis not present

## 2022-04-27 DIAGNOSIS — N2581 Secondary hyperparathyroidism of renal origin: Secondary | ICD-10-CM | POA: Diagnosis not present

## 2022-04-27 DIAGNOSIS — D631 Anemia in chronic kidney disease: Secondary | ICD-10-CM | POA: Diagnosis not present

## 2022-04-27 DIAGNOSIS — K769 Liver disease, unspecified: Secondary | ICD-10-CM | POA: Diagnosis not present

## 2022-04-28 DIAGNOSIS — D631 Anemia in chronic kidney disease: Secondary | ICD-10-CM | POA: Diagnosis not present

## 2022-04-28 DIAGNOSIS — K769 Liver disease, unspecified: Secondary | ICD-10-CM | POA: Diagnosis not present

## 2022-04-28 DIAGNOSIS — Z4932 Encounter for adequacy testing for peritoneal dialysis: Secondary | ICD-10-CM | POA: Diagnosis not present

## 2022-04-28 DIAGNOSIS — N186 End stage renal disease: Secondary | ICD-10-CM | POA: Diagnosis not present

## 2022-04-28 DIAGNOSIS — Z992 Dependence on renal dialysis: Secondary | ICD-10-CM | POA: Diagnosis not present

## 2022-04-28 DIAGNOSIS — R8290 Unspecified abnormal findings in urine: Secondary | ICD-10-CM | POA: Diagnosis not present

## 2022-04-28 DIAGNOSIS — N2581 Secondary hyperparathyroidism of renal origin: Secondary | ICD-10-CM | POA: Diagnosis not present

## 2022-04-29 DIAGNOSIS — D631 Anemia in chronic kidney disease: Secondary | ICD-10-CM | POA: Diagnosis not present

## 2022-04-29 DIAGNOSIS — N2581 Secondary hyperparathyroidism of renal origin: Secondary | ICD-10-CM | POA: Diagnosis not present

## 2022-04-29 DIAGNOSIS — N186 End stage renal disease: Secondary | ICD-10-CM | POA: Diagnosis not present

## 2022-04-29 DIAGNOSIS — Z992 Dependence on renal dialysis: Secondary | ICD-10-CM | POA: Diagnosis not present

## 2022-04-29 DIAGNOSIS — R8290 Unspecified abnormal findings in urine: Secondary | ICD-10-CM | POA: Diagnosis not present

## 2022-04-29 DIAGNOSIS — K769 Liver disease, unspecified: Secondary | ICD-10-CM | POA: Diagnosis not present

## 2022-04-29 DIAGNOSIS — Z4932 Encounter for adequacy testing for peritoneal dialysis: Secondary | ICD-10-CM | POA: Diagnosis not present

## 2022-04-30 DIAGNOSIS — N186 End stage renal disease: Secondary | ICD-10-CM | POA: Diagnosis not present

## 2022-04-30 DIAGNOSIS — Z4932 Encounter for adequacy testing for peritoneal dialysis: Secondary | ICD-10-CM | POA: Diagnosis not present

## 2022-04-30 DIAGNOSIS — N2581 Secondary hyperparathyroidism of renal origin: Secondary | ICD-10-CM | POA: Diagnosis not present

## 2022-04-30 DIAGNOSIS — D631 Anemia in chronic kidney disease: Secondary | ICD-10-CM | POA: Diagnosis not present

## 2022-04-30 DIAGNOSIS — K769 Liver disease, unspecified: Secondary | ICD-10-CM | POA: Diagnosis not present

## 2022-04-30 DIAGNOSIS — Z992 Dependence on renal dialysis: Secondary | ICD-10-CM | POA: Diagnosis not present

## 2022-04-30 DIAGNOSIS — R8290 Unspecified abnormal findings in urine: Secondary | ICD-10-CM | POA: Diagnosis not present

## 2022-05-01 DIAGNOSIS — Z992 Dependence on renal dialysis: Secondary | ICD-10-CM | POA: Diagnosis not present

## 2022-05-01 DIAGNOSIS — R8290 Unspecified abnormal findings in urine: Secondary | ICD-10-CM | POA: Diagnosis not present

## 2022-05-01 DIAGNOSIS — K769 Liver disease, unspecified: Secondary | ICD-10-CM | POA: Diagnosis not present

## 2022-05-01 DIAGNOSIS — N2581 Secondary hyperparathyroidism of renal origin: Secondary | ICD-10-CM | POA: Diagnosis not present

## 2022-05-01 DIAGNOSIS — Z4932 Encounter for adequacy testing for peritoneal dialysis: Secondary | ICD-10-CM | POA: Diagnosis not present

## 2022-05-01 DIAGNOSIS — N186 End stage renal disease: Secondary | ICD-10-CM | POA: Diagnosis not present

## 2022-05-01 DIAGNOSIS — D631 Anemia in chronic kidney disease: Secondary | ICD-10-CM | POA: Diagnosis not present

## 2022-05-02 DIAGNOSIS — D631 Anemia in chronic kidney disease: Secondary | ICD-10-CM | POA: Diagnosis not present

## 2022-05-02 DIAGNOSIS — R8290 Unspecified abnormal findings in urine: Secondary | ICD-10-CM | POA: Diagnosis not present

## 2022-05-02 DIAGNOSIS — N186 End stage renal disease: Secondary | ICD-10-CM | POA: Diagnosis not present

## 2022-05-02 DIAGNOSIS — Z992 Dependence on renal dialysis: Secondary | ICD-10-CM | POA: Diagnosis not present

## 2022-05-02 DIAGNOSIS — K769 Liver disease, unspecified: Secondary | ICD-10-CM | POA: Diagnosis not present

## 2022-05-02 DIAGNOSIS — Z4932 Encounter for adequacy testing for peritoneal dialysis: Secondary | ICD-10-CM | POA: Diagnosis not present

## 2022-05-02 DIAGNOSIS — N2581 Secondary hyperparathyroidism of renal origin: Secondary | ICD-10-CM | POA: Diagnosis not present

## 2022-05-03 DIAGNOSIS — Z992 Dependence on renal dialysis: Secondary | ICD-10-CM | POA: Diagnosis not present

## 2022-05-03 DIAGNOSIS — D631 Anemia in chronic kidney disease: Secondary | ICD-10-CM | POA: Diagnosis not present

## 2022-05-03 DIAGNOSIS — N2581 Secondary hyperparathyroidism of renal origin: Secondary | ICD-10-CM | POA: Diagnosis not present

## 2022-05-03 DIAGNOSIS — Z4932 Encounter for adequacy testing for peritoneal dialysis: Secondary | ICD-10-CM | POA: Diagnosis not present

## 2022-05-03 DIAGNOSIS — R8290 Unspecified abnormal findings in urine: Secondary | ICD-10-CM | POA: Diagnosis not present

## 2022-05-03 DIAGNOSIS — N186 End stage renal disease: Secondary | ICD-10-CM | POA: Diagnosis not present

## 2022-05-03 DIAGNOSIS — K769 Liver disease, unspecified: Secondary | ICD-10-CM | POA: Diagnosis not present

## 2022-05-04 DIAGNOSIS — D631 Anemia in chronic kidney disease: Secondary | ICD-10-CM | POA: Diagnosis not present

## 2022-05-04 DIAGNOSIS — Z992 Dependence on renal dialysis: Secondary | ICD-10-CM | POA: Diagnosis not present

## 2022-05-04 DIAGNOSIS — Z4932 Encounter for adequacy testing for peritoneal dialysis: Secondary | ICD-10-CM | POA: Diagnosis not present

## 2022-05-04 DIAGNOSIS — N186 End stage renal disease: Secondary | ICD-10-CM | POA: Diagnosis not present

## 2022-05-04 DIAGNOSIS — R8290 Unspecified abnormal findings in urine: Secondary | ICD-10-CM | POA: Diagnosis not present

## 2022-05-04 DIAGNOSIS — N2581 Secondary hyperparathyroidism of renal origin: Secondary | ICD-10-CM | POA: Diagnosis not present

## 2022-05-04 DIAGNOSIS — K769 Liver disease, unspecified: Secondary | ICD-10-CM | POA: Diagnosis not present

## 2022-05-05 DIAGNOSIS — N2581 Secondary hyperparathyroidism of renal origin: Secondary | ICD-10-CM | POA: Diagnosis not present

## 2022-05-05 DIAGNOSIS — R8290 Unspecified abnormal findings in urine: Secondary | ICD-10-CM | POA: Diagnosis not present

## 2022-05-05 DIAGNOSIS — Z992 Dependence on renal dialysis: Secondary | ICD-10-CM | POA: Diagnosis not present

## 2022-05-05 DIAGNOSIS — K769 Liver disease, unspecified: Secondary | ICD-10-CM | POA: Diagnosis not present

## 2022-05-05 DIAGNOSIS — D631 Anemia in chronic kidney disease: Secondary | ICD-10-CM | POA: Diagnosis not present

## 2022-05-05 DIAGNOSIS — N186 End stage renal disease: Secondary | ICD-10-CM | POA: Diagnosis not present

## 2022-05-05 DIAGNOSIS — Z4932 Encounter for adequacy testing for peritoneal dialysis: Secondary | ICD-10-CM | POA: Diagnosis not present

## 2022-05-06 DIAGNOSIS — N2581 Secondary hyperparathyroidism of renal origin: Secondary | ICD-10-CM | POA: Diagnosis not present

## 2022-05-06 DIAGNOSIS — D631 Anemia in chronic kidney disease: Secondary | ICD-10-CM | POA: Diagnosis not present

## 2022-05-06 DIAGNOSIS — R8290 Unspecified abnormal findings in urine: Secondary | ICD-10-CM | POA: Diagnosis not present

## 2022-05-06 DIAGNOSIS — K769 Liver disease, unspecified: Secondary | ICD-10-CM | POA: Diagnosis not present

## 2022-05-06 DIAGNOSIS — Z4932 Encounter for adequacy testing for peritoneal dialysis: Secondary | ICD-10-CM | POA: Diagnosis not present

## 2022-05-06 DIAGNOSIS — Z992 Dependence on renal dialysis: Secondary | ICD-10-CM | POA: Diagnosis not present

## 2022-05-06 DIAGNOSIS — N186 End stage renal disease: Secondary | ICD-10-CM | POA: Diagnosis not present

## 2022-05-07 DIAGNOSIS — R8290 Unspecified abnormal findings in urine: Secondary | ICD-10-CM | POA: Diagnosis not present

## 2022-05-07 DIAGNOSIS — Z4932 Encounter for adequacy testing for peritoneal dialysis: Secondary | ICD-10-CM | POA: Diagnosis not present

## 2022-05-07 DIAGNOSIS — K769 Liver disease, unspecified: Secondary | ICD-10-CM | POA: Diagnosis not present

## 2022-05-07 DIAGNOSIS — N186 End stage renal disease: Secondary | ICD-10-CM | POA: Diagnosis not present

## 2022-05-07 DIAGNOSIS — N2581 Secondary hyperparathyroidism of renal origin: Secondary | ICD-10-CM | POA: Diagnosis not present

## 2022-05-07 DIAGNOSIS — D631 Anemia in chronic kidney disease: Secondary | ICD-10-CM | POA: Diagnosis not present

## 2022-05-07 DIAGNOSIS — Z992 Dependence on renal dialysis: Secondary | ICD-10-CM | POA: Diagnosis not present

## 2022-05-08 DIAGNOSIS — N186 End stage renal disease: Secondary | ICD-10-CM | POA: Diagnosis not present

## 2022-05-08 DIAGNOSIS — K769 Liver disease, unspecified: Secondary | ICD-10-CM | POA: Diagnosis not present

## 2022-05-08 DIAGNOSIS — Z4932 Encounter for adequacy testing for peritoneal dialysis: Secondary | ICD-10-CM | POA: Diagnosis not present

## 2022-05-08 DIAGNOSIS — D631 Anemia in chronic kidney disease: Secondary | ICD-10-CM | POA: Diagnosis not present

## 2022-05-08 DIAGNOSIS — N2581 Secondary hyperparathyroidism of renal origin: Secondary | ICD-10-CM | POA: Diagnosis not present

## 2022-05-08 DIAGNOSIS — Z992 Dependence on renal dialysis: Secondary | ICD-10-CM | POA: Diagnosis not present

## 2022-05-08 DIAGNOSIS — R8290 Unspecified abnormal findings in urine: Secondary | ICD-10-CM | POA: Diagnosis not present

## 2022-05-09 DIAGNOSIS — R8290 Unspecified abnormal findings in urine: Secondary | ICD-10-CM | POA: Diagnosis not present

## 2022-05-09 DIAGNOSIS — D631 Anemia in chronic kidney disease: Secondary | ICD-10-CM | POA: Diagnosis not present

## 2022-05-09 DIAGNOSIS — N2581 Secondary hyperparathyroidism of renal origin: Secondary | ICD-10-CM | POA: Diagnosis not present

## 2022-05-09 DIAGNOSIS — K769 Liver disease, unspecified: Secondary | ICD-10-CM | POA: Diagnosis not present

## 2022-05-09 DIAGNOSIS — Z4932 Encounter for adequacy testing for peritoneal dialysis: Secondary | ICD-10-CM | POA: Diagnosis not present

## 2022-05-09 DIAGNOSIS — Z992 Dependence on renal dialysis: Secondary | ICD-10-CM | POA: Diagnosis not present

## 2022-05-09 DIAGNOSIS — N186 End stage renal disease: Secondary | ICD-10-CM | POA: Diagnosis not present

## 2022-05-10 DIAGNOSIS — Z992 Dependence on renal dialysis: Secondary | ICD-10-CM | POA: Diagnosis not present

## 2022-05-10 DIAGNOSIS — Z4932 Encounter for adequacy testing for peritoneal dialysis: Secondary | ICD-10-CM | POA: Diagnosis not present

## 2022-05-10 DIAGNOSIS — K769 Liver disease, unspecified: Secondary | ICD-10-CM | POA: Diagnosis not present

## 2022-05-10 DIAGNOSIS — N186 End stage renal disease: Secondary | ICD-10-CM | POA: Diagnosis not present

## 2022-05-10 DIAGNOSIS — R8290 Unspecified abnormal findings in urine: Secondary | ICD-10-CM | POA: Diagnosis not present

## 2022-05-10 DIAGNOSIS — N2581 Secondary hyperparathyroidism of renal origin: Secondary | ICD-10-CM | POA: Diagnosis not present

## 2022-05-10 DIAGNOSIS — D631 Anemia in chronic kidney disease: Secondary | ICD-10-CM | POA: Diagnosis not present

## 2022-05-11 DIAGNOSIS — R8290 Unspecified abnormal findings in urine: Secondary | ICD-10-CM | POA: Diagnosis not present

## 2022-05-11 DIAGNOSIS — I129 Hypertensive chronic kidney disease with stage 1 through stage 4 chronic kidney disease, or unspecified chronic kidney disease: Secondary | ICD-10-CM | POA: Diagnosis not present

## 2022-05-11 DIAGNOSIS — Z992 Dependence on renal dialysis: Secondary | ICD-10-CM | POA: Diagnosis not present

## 2022-05-11 DIAGNOSIS — D631 Anemia in chronic kidney disease: Secondary | ICD-10-CM | POA: Diagnosis not present

## 2022-05-11 DIAGNOSIS — Z4932 Encounter for adequacy testing for peritoneal dialysis: Secondary | ICD-10-CM | POA: Diagnosis not present

## 2022-05-11 DIAGNOSIS — K769 Liver disease, unspecified: Secondary | ICD-10-CM | POA: Diagnosis not present

## 2022-05-11 DIAGNOSIS — N2581 Secondary hyperparathyroidism of renal origin: Secondary | ICD-10-CM | POA: Diagnosis not present

## 2022-05-11 DIAGNOSIS — N186 End stage renal disease: Secondary | ICD-10-CM | POA: Diagnosis not present

## 2022-05-12 DIAGNOSIS — R8290 Unspecified abnormal findings in urine: Secondary | ICD-10-CM | POA: Diagnosis not present

## 2022-05-12 DIAGNOSIS — Z992 Dependence on renal dialysis: Secondary | ICD-10-CM | POA: Diagnosis not present

## 2022-05-12 DIAGNOSIS — N186 End stage renal disease: Secondary | ICD-10-CM | POA: Diagnosis not present

## 2022-05-12 DIAGNOSIS — Z4932 Encounter for adequacy testing for peritoneal dialysis: Secondary | ICD-10-CM | POA: Diagnosis not present

## 2022-05-12 DIAGNOSIS — N2581 Secondary hyperparathyroidism of renal origin: Secondary | ICD-10-CM | POA: Diagnosis not present

## 2022-05-12 DIAGNOSIS — D631 Anemia in chronic kidney disease: Secondary | ICD-10-CM | POA: Diagnosis not present

## 2022-05-13 DIAGNOSIS — N186 End stage renal disease: Secondary | ICD-10-CM | POA: Diagnosis not present

## 2022-05-13 DIAGNOSIS — Z4932 Encounter for adequacy testing for peritoneal dialysis: Secondary | ICD-10-CM | POA: Diagnosis not present

## 2022-05-13 DIAGNOSIS — Z992 Dependence on renal dialysis: Secondary | ICD-10-CM | POA: Diagnosis not present

## 2022-05-13 DIAGNOSIS — N2581 Secondary hyperparathyroidism of renal origin: Secondary | ICD-10-CM | POA: Diagnosis not present

## 2022-05-13 DIAGNOSIS — D631 Anemia in chronic kidney disease: Secondary | ICD-10-CM | POA: Diagnosis not present

## 2022-05-13 DIAGNOSIS — R8290 Unspecified abnormal findings in urine: Secondary | ICD-10-CM | POA: Diagnosis not present

## 2022-05-14 DIAGNOSIS — R8290 Unspecified abnormal findings in urine: Secondary | ICD-10-CM | POA: Diagnosis not present

## 2022-05-14 DIAGNOSIS — N2581 Secondary hyperparathyroidism of renal origin: Secondary | ICD-10-CM | POA: Diagnosis not present

## 2022-05-14 DIAGNOSIS — Z992 Dependence on renal dialysis: Secondary | ICD-10-CM | POA: Diagnosis not present

## 2022-05-14 DIAGNOSIS — Z4932 Encounter for adequacy testing for peritoneal dialysis: Secondary | ICD-10-CM | POA: Diagnosis not present

## 2022-05-14 DIAGNOSIS — D631 Anemia in chronic kidney disease: Secondary | ICD-10-CM | POA: Diagnosis not present

## 2022-05-14 DIAGNOSIS — N186 End stage renal disease: Secondary | ICD-10-CM | POA: Diagnosis not present

## 2022-05-15 DIAGNOSIS — R8290 Unspecified abnormal findings in urine: Secondary | ICD-10-CM | POA: Diagnosis not present

## 2022-05-15 DIAGNOSIS — Z4932 Encounter for adequacy testing for peritoneal dialysis: Secondary | ICD-10-CM | POA: Diagnosis not present

## 2022-05-15 DIAGNOSIS — Z992 Dependence on renal dialysis: Secondary | ICD-10-CM | POA: Diagnosis not present

## 2022-05-15 DIAGNOSIS — N186 End stage renal disease: Secondary | ICD-10-CM | POA: Diagnosis not present

## 2022-05-15 DIAGNOSIS — N2581 Secondary hyperparathyroidism of renal origin: Secondary | ICD-10-CM | POA: Diagnosis not present

## 2022-05-15 DIAGNOSIS — D631 Anemia in chronic kidney disease: Secondary | ICD-10-CM | POA: Diagnosis not present

## 2022-05-16 DIAGNOSIS — R8290 Unspecified abnormal findings in urine: Secondary | ICD-10-CM | POA: Diagnosis not present

## 2022-05-16 DIAGNOSIS — Z4932 Encounter for adequacy testing for peritoneal dialysis: Secondary | ICD-10-CM | POA: Diagnosis not present

## 2022-05-16 DIAGNOSIS — N186 End stage renal disease: Secondary | ICD-10-CM | POA: Diagnosis not present

## 2022-05-16 DIAGNOSIS — Z992 Dependence on renal dialysis: Secondary | ICD-10-CM | POA: Diagnosis not present

## 2022-05-16 DIAGNOSIS — N2581 Secondary hyperparathyroidism of renal origin: Secondary | ICD-10-CM | POA: Diagnosis not present

## 2022-05-16 DIAGNOSIS — D631 Anemia in chronic kidney disease: Secondary | ICD-10-CM | POA: Diagnosis not present

## 2022-05-17 DIAGNOSIS — R8290 Unspecified abnormal findings in urine: Secondary | ICD-10-CM | POA: Diagnosis not present

## 2022-05-17 DIAGNOSIS — N2581 Secondary hyperparathyroidism of renal origin: Secondary | ICD-10-CM | POA: Diagnosis not present

## 2022-05-17 DIAGNOSIS — D631 Anemia in chronic kidney disease: Secondary | ICD-10-CM | POA: Diagnosis not present

## 2022-05-17 DIAGNOSIS — Z4932 Encounter for adequacy testing for peritoneal dialysis: Secondary | ICD-10-CM | POA: Diagnosis not present

## 2022-05-17 DIAGNOSIS — N186 End stage renal disease: Secondary | ICD-10-CM | POA: Diagnosis not present

## 2022-05-17 DIAGNOSIS — Z992 Dependence on renal dialysis: Secondary | ICD-10-CM | POA: Diagnosis not present

## 2022-05-18 DIAGNOSIS — N186 End stage renal disease: Secondary | ICD-10-CM | POA: Diagnosis not present

## 2022-05-18 DIAGNOSIS — D631 Anemia in chronic kidney disease: Secondary | ICD-10-CM | POA: Diagnosis not present

## 2022-05-18 DIAGNOSIS — N2581 Secondary hyperparathyroidism of renal origin: Secondary | ICD-10-CM | POA: Diagnosis not present

## 2022-05-18 DIAGNOSIS — Z992 Dependence on renal dialysis: Secondary | ICD-10-CM | POA: Diagnosis not present

## 2022-05-18 DIAGNOSIS — R8290 Unspecified abnormal findings in urine: Secondary | ICD-10-CM | POA: Diagnosis not present

## 2022-05-18 DIAGNOSIS — Z4932 Encounter for adequacy testing for peritoneal dialysis: Secondary | ICD-10-CM | POA: Diagnosis not present

## 2022-05-19 DIAGNOSIS — Z4932 Encounter for adequacy testing for peritoneal dialysis: Secondary | ICD-10-CM | POA: Diagnosis not present

## 2022-05-19 DIAGNOSIS — N186 End stage renal disease: Secondary | ICD-10-CM | POA: Diagnosis not present

## 2022-05-19 DIAGNOSIS — N2581 Secondary hyperparathyroidism of renal origin: Secondary | ICD-10-CM | POA: Diagnosis not present

## 2022-05-19 DIAGNOSIS — Z992 Dependence on renal dialysis: Secondary | ICD-10-CM | POA: Diagnosis not present

## 2022-05-19 DIAGNOSIS — D631 Anemia in chronic kidney disease: Secondary | ICD-10-CM | POA: Diagnosis not present

## 2022-05-19 DIAGNOSIS — R8290 Unspecified abnormal findings in urine: Secondary | ICD-10-CM | POA: Diagnosis not present

## 2022-05-20 DIAGNOSIS — N186 End stage renal disease: Secondary | ICD-10-CM | POA: Diagnosis not present

## 2022-05-20 DIAGNOSIS — R8290 Unspecified abnormal findings in urine: Secondary | ICD-10-CM | POA: Diagnosis not present

## 2022-05-20 DIAGNOSIS — Z4932 Encounter for adequacy testing for peritoneal dialysis: Secondary | ICD-10-CM | POA: Diagnosis not present

## 2022-05-20 DIAGNOSIS — N2581 Secondary hyperparathyroidism of renal origin: Secondary | ICD-10-CM | POA: Diagnosis not present

## 2022-05-20 DIAGNOSIS — Z992 Dependence on renal dialysis: Secondary | ICD-10-CM | POA: Diagnosis not present

## 2022-05-20 DIAGNOSIS — D631 Anemia in chronic kidney disease: Secondary | ICD-10-CM | POA: Diagnosis not present

## 2022-05-21 DIAGNOSIS — N2581 Secondary hyperparathyroidism of renal origin: Secondary | ICD-10-CM | POA: Diagnosis not present

## 2022-05-21 DIAGNOSIS — N186 End stage renal disease: Secondary | ICD-10-CM | POA: Diagnosis not present

## 2022-05-21 DIAGNOSIS — D631 Anemia in chronic kidney disease: Secondary | ICD-10-CM | POA: Diagnosis not present

## 2022-05-21 DIAGNOSIS — Z4932 Encounter for adequacy testing for peritoneal dialysis: Secondary | ICD-10-CM | POA: Diagnosis not present

## 2022-05-21 DIAGNOSIS — Z992 Dependence on renal dialysis: Secondary | ICD-10-CM | POA: Diagnosis not present

## 2022-05-21 DIAGNOSIS — R8290 Unspecified abnormal findings in urine: Secondary | ICD-10-CM | POA: Diagnosis not present

## 2022-05-22 DIAGNOSIS — Z992 Dependence on renal dialysis: Secondary | ICD-10-CM | POA: Diagnosis not present

## 2022-05-22 DIAGNOSIS — N186 End stage renal disease: Secondary | ICD-10-CM | POA: Diagnosis not present

## 2022-05-22 DIAGNOSIS — Z4932 Encounter for adequacy testing for peritoneal dialysis: Secondary | ICD-10-CM | POA: Diagnosis not present

## 2022-05-22 DIAGNOSIS — D631 Anemia in chronic kidney disease: Secondary | ICD-10-CM | POA: Diagnosis not present

## 2022-05-22 DIAGNOSIS — N2581 Secondary hyperparathyroidism of renal origin: Secondary | ICD-10-CM | POA: Diagnosis not present

## 2022-05-22 DIAGNOSIS — R8290 Unspecified abnormal findings in urine: Secondary | ICD-10-CM | POA: Diagnosis not present

## 2022-05-23 DIAGNOSIS — D631 Anemia in chronic kidney disease: Secondary | ICD-10-CM | POA: Diagnosis not present

## 2022-05-23 DIAGNOSIS — Z4932 Encounter for adequacy testing for peritoneal dialysis: Secondary | ICD-10-CM | POA: Diagnosis not present

## 2022-05-23 DIAGNOSIS — N186 End stage renal disease: Secondary | ICD-10-CM | POA: Diagnosis not present

## 2022-05-23 DIAGNOSIS — Z992 Dependence on renal dialysis: Secondary | ICD-10-CM | POA: Diagnosis not present

## 2022-05-23 DIAGNOSIS — R8290 Unspecified abnormal findings in urine: Secondary | ICD-10-CM | POA: Diagnosis not present

## 2022-05-23 DIAGNOSIS — N2581 Secondary hyperparathyroidism of renal origin: Secondary | ICD-10-CM | POA: Diagnosis not present

## 2022-05-24 DIAGNOSIS — R8290 Unspecified abnormal findings in urine: Secondary | ICD-10-CM | POA: Diagnosis not present

## 2022-05-24 DIAGNOSIS — N2581 Secondary hyperparathyroidism of renal origin: Secondary | ICD-10-CM | POA: Diagnosis not present

## 2022-05-24 DIAGNOSIS — D631 Anemia in chronic kidney disease: Secondary | ICD-10-CM | POA: Diagnosis not present

## 2022-05-24 DIAGNOSIS — N186 End stage renal disease: Secondary | ICD-10-CM | POA: Diagnosis not present

## 2022-05-24 DIAGNOSIS — Z4932 Encounter for adequacy testing for peritoneal dialysis: Secondary | ICD-10-CM | POA: Diagnosis not present

## 2022-05-24 DIAGNOSIS — Z992 Dependence on renal dialysis: Secondary | ICD-10-CM | POA: Diagnosis not present

## 2022-05-25 DIAGNOSIS — N2581 Secondary hyperparathyroidism of renal origin: Secondary | ICD-10-CM | POA: Diagnosis not present

## 2022-05-25 DIAGNOSIS — Z992 Dependence on renal dialysis: Secondary | ICD-10-CM | POA: Diagnosis not present

## 2022-05-25 DIAGNOSIS — Z4932 Encounter for adequacy testing for peritoneal dialysis: Secondary | ICD-10-CM | POA: Diagnosis not present

## 2022-05-25 DIAGNOSIS — R8290 Unspecified abnormal findings in urine: Secondary | ICD-10-CM | POA: Diagnosis not present

## 2022-05-25 DIAGNOSIS — D631 Anemia in chronic kidney disease: Secondary | ICD-10-CM | POA: Diagnosis not present

## 2022-05-25 DIAGNOSIS — N186 End stage renal disease: Secondary | ICD-10-CM | POA: Diagnosis not present

## 2022-05-26 DIAGNOSIS — N2581 Secondary hyperparathyroidism of renal origin: Secondary | ICD-10-CM | POA: Diagnosis not present

## 2022-05-26 DIAGNOSIS — N186 End stage renal disease: Secondary | ICD-10-CM | POA: Diagnosis not present

## 2022-05-26 DIAGNOSIS — Z992 Dependence on renal dialysis: Secondary | ICD-10-CM | POA: Diagnosis not present

## 2022-05-26 DIAGNOSIS — R8290 Unspecified abnormal findings in urine: Secondary | ICD-10-CM | POA: Diagnosis not present

## 2022-05-26 DIAGNOSIS — Z4932 Encounter for adequacy testing for peritoneal dialysis: Secondary | ICD-10-CM | POA: Diagnosis not present

## 2022-05-26 DIAGNOSIS — D631 Anemia in chronic kidney disease: Secondary | ICD-10-CM | POA: Diagnosis not present

## 2022-05-27 DIAGNOSIS — N2581 Secondary hyperparathyroidism of renal origin: Secondary | ICD-10-CM | POA: Diagnosis not present

## 2022-05-27 DIAGNOSIS — N186 End stage renal disease: Secondary | ICD-10-CM | POA: Diagnosis not present

## 2022-05-27 DIAGNOSIS — D631 Anemia in chronic kidney disease: Secondary | ICD-10-CM | POA: Diagnosis not present

## 2022-05-27 DIAGNOSIS — R8290 Unspecified abnormal findings in urine: Secondary | ICD-10-CM | POA: Diagnosis not present

## 2022-05-27 DIAGNOSIS — Z4932 Encounter for adequacy testing for peritoneal dialysis: Secondary | ICD-10-CM | POA: Diagnosis not present

## 2022-05-27 DIAGNOSIS — Z992 Dependence on renal dialysis: Secondary | ICD-10-CM | POA: Diagnosis not present

## 2022-05-28 DIAGNOSIS — N2581 Secondary hyperparathyroidism of renal origin: Secondary | ICD-10-CM | POA: Diagnosis not present

## 2022-05-28 DIAGNOSIS — D631 Anemia in chronic kidney disease: Secondary | ICD-10-CM | POA: Diagnosis not present

## 2022-05-28 DIAGNOSIS — N186 End stage renal disease: Secondary | ICD-10-CM | POA: Diagnosis not present

## 2022-05-28 DIAGNOSIS — Z4932 Encounter for adequacy testing for peritoneal dialysis: Secondary | ICD-10-CM | POA: Diagnosis not present

## 2022-05-28 DIAGNOSIS — Z992 Dependence on renal dialysis: Secondary | ICD-10-CM | POA: Diagnosis not present

## 2022-05-28 DIAGNOSIS — R8290 Unspecified abnormal findings in urine: Secondary | ICD-10-CM | POA: Diagnosis not present

## 2022-05-29 DIAGNOSIS — G44209 Tension-type headache, unspecified, not intractable: Secondary | ICD-10-CM | POA: Diagnosis not present

## 2022-05-29 DIAGNOSIS — N185 Chronic kidney disease, stage 5: Secondary | ICD-10-CM | POA: Diagnosis not present

## 2022-05-29 DIAGNOSIS — N186 End stage renal disease: Secondary | ICD-10-CM | POA: Diagnosis not present

## 2022-05-29 DIAGNOSIS — Z992 Dependence on renal dialysis: Secondary | ICD-10-CM | POA: Diagnosis not present

## 2022-05-29 DIAGNOSIS — D631 Anemia in chronic kidney disease: Secondary | ICD-10-CM | POA: Diagnosis not present

## 2022-05-29 DIAGNOSIS — M26629 Arthralgia of temporomandibular joint, unspecified side: Secondary | ICD-10-CM | POA: Diagnosis not present

## 2022-05-29 DIAGNOSIS — R8290 Unspecified abnormal findings in urine: Secondary | ICD-10-CM | POA: Diagnosis not present

## 2022-05-29 DIAGNOSIS — Z4932 Encounter for adequacy testing for peritoneal dialysis: Secondary | ICD-10-CM | POA: Diagnosis not present

## 2022-05-29 DIAGNOSIS — N2581 Secondary hyperparathyroidism of renal origin: Secondary | ICD-10-CM | POA: Diagnosis not present

## 2022-05-30 DIAGNOSIS — D631 Anemia in chronic kidney disease: Secondary | ICD-10-CM | POA: Diagnosis not present

## 2022-05-30 DIAGNOSIS — N186 End stage renal disease: Secondary | ICD-10-CM | POA: Diagnosis not present

## 2022-05-30 DIAGNOSIS — Z4932 Encounter for adequacy testing for peritoneal dialysis: Secondary | ICD-10-CM | POA: Diagnosis not present

## 2022-05-30 DIAGNOSIS — Z992 Dependence on renal dialysis: Secondary | ICD-10-CM | POA: Diagnosis not present

## 2022-05-30 DIAGNOSIS — R8290 Unspecified abnormal findings in urine: Secondary | ICD-10-CM | POA: Diagnosis not present

## 2022-05-30 DIAGNOSIS — N2581 Secondary hyperparathyroidism of renal origin: Secondary | ICD-10-CM | POA: Diagnosis not present

## 2022-05-31 DIAGNOSIS — R8290 Unspecified abnormal findings in urine: Secondary | ICD-10-CM | POA: Diagnosis not present

## 2022-05-31 DIAGNOSIS — N2581 Secondary hyperparathyroidism of renal origin: Secondary | ICD-10-CM | POA: Diagnosis not present

## 2022-05-31 DIAGNOSIS — D631 Anemia in chronic kidney disease: Secondary | ICD-10-CM | POA: Diagnosis not present

## 2022-05-31 DIAGNOSIS — Z992 Dependence on renal dialysis: Secondary | ICD-10-CM | POA: Diagnosis not present

## 2022-05-31 DIAGNOSIS — N186 End stage renal disease: Secondary | ICD-10-CM | POA: Diagnosis not present

## 2022-05-31 DIAGNOSIS — B029 Zoster without complications: Secondary | ICD-10-CM | POA: Diagnosis not present

## 2022-05-31 DIAGNOSIS — Z4932 Encounter for adequacy testing for peritoneal dialysis: Secondary | ICD-10-CM | POA: Diagnosis not present

## 2022-06-01 DIAGNOSIS — D631 Anemia in chronic kidney disease: Secondary | ICD-10-CM | POA: Diagnosis not present

## 2022-06-01 DIAGNOSIS — N186 End stage renal disease: Secondary | ICD-10-CM | POA: Diagnosis not present

## 2022-06-01 DIAGNOSIS — N2581 Secondary hyperparathyroidism of renal origin: Secondary | ICD-10-CM | POA: Diagnosis not present

## 2022-06-01 DIAGNOSIS — R8290 Unspecified abnormal findings in urine: Secondary | ICD-10-CM | POA: Diagnosis not present

## 2022-06-01 DIAGNOSIS — Z4932 Encounter for adequacy testing for peritoneal dialysis: Secondary | ICD-10-CM | POA: Diagnosis not present

## 2022-06-01 DIAGNOSIS — Z992 Dependence on renal dialysis: Secondary | ICD-10-CM | POA: Diagnosis not present

## 2022-06-02 DIAGNOSIS — Z4932 Encounter for adequacy testing for peritoneal dialysis: Secondary | ICD-10-CM | POA: Diagnosis not present

## 2022-06-02 DIAGNOSIS — D631 Anemia in chronic kidney disease: Secondary | ICD-10-CM | POA: Diagnosis not present

## 2022-06-02 DIAGNOSIS — N2581 Secondary hyperparathyroidism of renal origin: Secondary | ICD-10-CM | POA: Diagnosis not present

## 2022-06-02 DIAGNOSIS — Z992 Dependence on renal dialysis: Secondary | ICD-10-CM | POA: Diagnosis not present

## 2022-06-02 DIAGNOSIS — N186 End stage renal disease: Secondary | ICD-10-CM | POA: Diagnosis not present

## 2022-06-02 DIAGNOSIS — R8290 Unspecified abnormal findings in urine: Secondary | ICD-10-CM | POA: Diagnosis not present

## 2022-06-03 DIAGNOSIS — N186 End stage renal disease: Secondary | ICD-10-CM | POA: Diagnosis not present

## 2022-06-03 DIAGNOSIS — N2581 Secondary hyperparathyroidism of renal origin: Secondary | ICD-10-CM | POA: Diagnosis not present

## 2022-06-03 DIAGNOSIS — Z4932 Encounter for adequacy testing for peritoneal dialysis: Secondary | ICD-10-CM | POA: Diagnosis not present

## 2022-06-03 DIAGNOSIS — Z992 Dependence on renal dialysis: Secondary | ICD-10-CM | POA: Diagnosis not present

## 2022-06-03 DIAGNOSIS — R8290 Unspecified abnormal findings in urine: Secondary | ICD-10-CM | POA: Diagnosis not present

## 2022-06-03 DIAGNOSIS — D631 Anemia in chronic kidney disease: Secondary | ICD-10-CM | POA: Diagnosis not present

## 2022-06-04 DIAGNOSIS — N186 End stage renal disease: Secondary | ICD-10-CM | POA: Diagnosis not present

## 2022-06-04 DIAGNOSIS — R8290 Unspecified abnormal findings in urine: Secondary | ICD-10-CM | POA: Diagnosis not present

## 2022-06-04 DIAGNOSIS — D631 Anemia in chronic kidney disease: Secondary | ICD-10-CM | POA: Diagnosis not present

## 2022-06-04 DIAGNOSIS — N2581 Secondary hyperparathyroidism of renal origin: Secondary | ICD-10-CM | POA: Diagnosis not present

## 2022-06-04 DIAGNOSIS — Z992 Dependence on renal dialysis: Secondary | ICD-10-CM | POA: Diagnosis not present

## 2022-06-04 DIAGNOSIS — Z4932 Encounter for adequacy testing for peritoneal dialysis: Secondary | ICD-10-CM | POA: Diagnosis not present

## 2022-06-05 DIAGNOSIS — D631 Anemia in chronic kidney disease: Secondary | ICD-10-CM | POA: Diagnosis not present

## 2022-06-05 DIAGNOSIS — N2581 Secondary hyperparathyroidism of renal origin: Secondary | ICD-10-CM | POA: Diagnosis not present

## 2022-06-05 DIAGNOSIS — Z4932 Encounter for adequacy testing for peritoneal dialysis: Secondary | ICD-10-CM | POA: Diagnosis not present

## 2022-06-05 DIAGNOSIS — R8290 Unspecified abnormal findings in urine: Secondary | ICD-10-CM | POA: Diagnosis not present

## 2022-06-05 DIAGNOSIS — N186 End stage renal disease: Secondary | ICD-10-CM | POA: Diagnosis not present

## 2022-06-05 DIAGNOSIS — Z992 Dependence on renal dialysis: Secondary | ICD-10-CM | POA: Diagnosis not present

## 2022-06-06 DIAGNOSIS — R8290 Unspecified abnormal findings in urine: Secondary | ICD-10-CM | POA: Diagnosis not present

## 2022-06-06 DIAGNOSIS — N186 End stage renal disease: Secondary | ICD-10-CM | POA: Diagnosis not present

## 2022-06-06 DIAGNOSIS — Z4932 Encounter for adequacy testing for peritoneal dialysis: Secondary | ICD-10-CM | POA: Diagnosis not present

## 2022-06-06 DIAGNOSIS — Z992 Dependence on renal dialysis: Secondary | ICD-10-CM | POA: Diagnosis not present

## 2022-06-06 DIAGNOSIS — N2581 Secondary hyperparathyroidism of renal origin: Secondary | ICD-10-CM | POA: Diagnosis not present

## 2022-06-06 DIAGNOSIS — D631 Anemia in chronic kidney disease: Secondary | ICD-10-CM | POA: Diagnosis not present

## 2022-06-07 DIAGNOSIS — Z4932 Encounter for adequacy testing for peritoneal dialysis: Secondary | ICD-10-CM | POA: Diagnosis not present

## 2022-06-07 DIAGNOSIS — N186 End stage renal disease: Secondary | ICD-10-CM | POA: Diagnosis not present

## 2022-06-07 DIAGNOSIS — Z992 Dependence on renal dialysis: Secondary | ICD-10-CM | POA: Diagnosis not present

## 2022-06-07 DIAGNOSIS — N2581 Secondary hyperparathyroidism of renal origin: Secondary | ICD-10-CM | POA: Diagnosis not present

## 2022-06-07 DIAGNOSIS — R8290 Unspecified abnormal findings in urine: Secondary | ICD-10-CM | POA: Diagnosis not present

## 2022-06-07 DIAGNOSIS — D631 Anemia in chronic kidney disease: Secondary | ICD-10-CM | POA: Diagnosis not present

## 2022-06-08 DIAGNOSIS — N186 End stage renal disease: Secondary | ICD-10-CM | POA: Diagnosis not present

## 2022-06-08 DIAGNOSIS — Z992 Dependence on renal dialysis: Secondary | ICD-10-CM | POA: Diagnosis not present

## 2022-06-08 DIAGNOSIS — D631 Anemia in chronic kidney disease: Secondary | ICD-10-CM | POA: Diagnosis not present

## 2022-06-08 DIAGNOSIS — Z4932 Encounter for adequacy testing for peritoneal dialysis: Secondary | ICD-10-CM | POA: Diagnosis not present

## 2022-06-08 DIAGNOSIS — N2581 Secondary hyperparathyroidism of renal origin: Secondary | ICD-10-CM | POA: Diagnosis not present

## 2022-06-08 DIAGNOSIS — R8290 Unspecified abnormal findings in urine: Secondary | ICD-10-CM | POA: Diagnosis not present

## 2022-06-09 DIAGNOSIS — Z992 Dependence on renal dialysis: Secondary | ICD-10-CM | POA: Diagnosis not present

## 2022-06-09 DIAGNOSIS — D631 Anemia in chronic kidney disease: Secondary | ICD-10-CM | POA: Diagnosis not present

## 2022-06-09 DIAGNOSIS — N2581 Secondary hyperparathyroidism of renal origin: Secondary | ICD-10-CM | POA: Diagnosis not present

## 2022-06-09 DIAGNOSIS — N186 End stage renal disease: Secondary | ICD-10-CM | POA: Diagnosis not present

## 2022-06-09 DIAGNOSIS — R8290 Unspecified abnormal findings in urine: Secondary | ICD-10-CM | POA: Diagnosis not present

## 2022-06-09 DIAGNOSIS — Z4932 Encounter for adequacy testing for peritoneal dialysis: Secondary | ICD-10-CM | POA: Diagnosis not present

## 2022-06-10 DIAGNOSIS — N186 End stage renal disease: Secondary | ICD-10-CM | POA: Diagnosis not present

## 2022-06-10 DIAGNOSIS — R8290 Unspecified abnormal findings in urine: Secondary | ICD-10-CM | POA: Diagnosis not present

## 2022-06-10 DIAGNOSIS — D631 Anemia in chronic kidney disease: Secondary | ICD-10-CM | POA: Diagnosis not present

## 2022-06-10 DIAGNOSIS — N2581 Secondary hyperparathyroidism of renal origin: Secondary | ICD-10-CM | POA: Diagnosis not present

## 2022-06-10 DIAGNOSIS — Z992 Dependence on renal dialysis: Secondary | ICD-10-CM | POA: Diagnosis not present

## 2022-06-10 DIAGNOSIS — Z4932 Encounter for adequacy testing for peritoneal dialysis: Secondary | ICD-10-CM | POA: Diagnosis not present

## 2022-06-11 DIAGNOSIS — Z992 Dependence on renal dialysis: Secondary | ICD-10-CM | POA: Diagnosis not present

## 2022-06-11 DIAGNOSIS — D631 Anemia in chronic kidney disease: Secondary | ICD-10-CM | POA: Diagnosis not present

## 2022-06-11 DIAGNOSIS — R8290 Unspecified abnormal findings in urine: Secondary | ICD-10-CM | POA: Diagnosis not present

## 2022-06-11 DIAGNOSIS — N2581 Secondary hyperparathyroidism of renal origin: Secondary | ICD-10-CM | POA: Diagnosis not present

## 2022-06-11 DIAGNOSIS — N186 End stage renal disease: Secondary | ICD-10-CM | POA: Diagnosis not present

## 2022-06-11 DIAGNOSIS — Z4932 Encounter for adequacy testing for peritoneal dialysis: Secondary | ICD-10-CM | POA: Diagnosis not present

## 2022-06-12 DIAGNOSIS — N186 End stage renal disease: Secondary | ICD-10-CM | POA: Diagnosis not present

## 2022-06-12 DIAGNOSIS — K769 Liver disease, unspecified: Secondary | ICD-10-CM | POA: Diagnosis not present

## 2022-06-12 DIAGNOSIS — N2581 Secondary hyperparathyroidism of renal origin: Secondary | ICD-10-CM | POA: Diagnosis not present

## 2022-06-12 DIAGNOSIS — Z4932 Encounter for adequacy testing for peritoneal dialysis: Secondary | ICD-10-CM | POA: Diagnosis not present

## 2022-06-12 DIAGNOSIS — Z992 Dependence on renal dialysis: Secondary | ICD-10-CM | POA: Diagnosis not present

## 2022-06-12 DIAGNOSIS — R8290 Unspecified abnormal findings in urine: Secondary | ICD-10-CM | POA: Diagnosis not present

## 2022-06-12 DIAGNOSIS — D631 Anemia in chronic kidney disease: Secondary | ICD-10-CM | POA: Diagnosis not present

## 2022-06-13 DIAGNOSIS — N186 End stage renal disease: Secondary | ICD-10-CM | POA: Diagnosis not present

## 2022-06-13 DIAGNOSIS — N2581 Secondary hyperparathyroidism of renal origin: Secondary | ICD-10-CM | POA: Diagnosis not present

## 2022-06-13 DIAGNOSIS — Z4932 Encounter for adequacy testing for peritoneal dialysis: Secondary | ICD-10-CM | POA: Diagnosis not present

## 2022-06-13 DIAGNOSIS — R8290 Unspecified abnormal findings in urine: Secondary | ICD-10-CM | POA: Diagnosis not present

## 2022-06-13 DIAGNOSIS — Z992 Dependence on renal dialysis: Secondary | ICD-10-CM | POA: Diagnosis not present

## 2022-06-13 DIAGNOSIS — K769 Liver disease, unspecified: Secondary | ICD-10-CM | POA: Diagnosis not present

## 2022-06-13 DIAGNOSIS — D631 Anemia in chronic kidney disease: Secondary | ICD-10-CM | POA: Diagnosis not present

## 2022-06-14 DIAGNOSIS — N186 End stage renal disease: Secondary | ICD-10-CM | POA: Diagnosis not present

## 2022-06-14 DIAGNOSIS — R8290 Unspecified abnormal findings in urine: Secondary | ICD-10-CM | POA: Diagnosis not present

## 2022-06-14 DIAGNOSIS — N2581 Secondary hyperparathyroidism of renal origin: Secondary | ICD-10-CM | POA: Diagnosis not present

## 2022-06-14 DIAGNOSIS — Z992 Dependence on renal dialysis: Secondary | ICD-10-CM | POA: Diagnosis not present

## 2022-06-14 DIAGNOSIS — Z4932 Encounter for adequacy testing for peritoneal dialysis: Secondary | ICD-10-CM | POA: Diagnosis not present

## 2022-06-14 DIAGNOSIS — D631 Anemia in chronic kidney disease: Secondary | ICD-10-CM | POA: Diagnosis not present

## 2022-06-14 DIAGNOSIS — K769 Liver disease, unspecified: Secondary | ICD-10-CM | POA: Diagnosis not present

## 2022-06-15 DIAGNOSIS — Z4932 Encounter for adequacy testing for peritoneal dialysis: Secondary | ICD-10-CM | POA: Diagnosis not present

## 2022-06-15 DIAGNOSIS — N186 End stage renal disease: Secondary | ICD-10-CM | POA: Diagnosis not present

## 2022-06-15 DIAGNOSIS — D631 Anemia in chronic kidney disease: Secondary | ICD-10-CM | POA: Diagnosis not present

## 2022-06-15 DIAGNOSIS — R8290 Unspecified abnormal findings in urine: Secondary | ICD-10-CM | POA: Diagnosis not present

## 2022-06-15 DIAGNOSIS — N2581 Secondary hyperparathyroidism of renal origin: Secondary | ICD-10-CM | POA: Diagnosis not present

## 2022-06-15 DIAGNOSIS — Z992 Dependence on renal dialysis: Secondary | ICD-10-CM | POA: Diagnosis not present

## 2022-06-15 DIAGNOSIS — K769 Liver disease, unspecified: Secondary | ICD-10-CM | POA: Diagnosis not present

## 2022-06-16 DIAGNOSIS — N186 End stage renal disease: Secondary | ICD-10-CM | POA: Diagnosis not present

## 2022-06-16 DIAGNOSIS — K769 Liver disease, unspecified: Secondary | ICD-10-CM | POA: Diagnosis not present

## 2022-06-16 DIAGNOSIS — N2581 Secondary hyperparathyroidism of renal origin: Secondary | ICD-10-CM | POA: Diagnosis not present

## 2022-06-16 DIAGNOSIS — Z4932 Encounter for adequacy testing for peritoneal dialysis: Secondary | ICD-10-CM | POA: Diagnosis not present

## 2022-06-16 DIAGNOSIS — D631 Anemia in chronic kidney disease: Secondary | ICD-10-CM | POA: Diagnosis not present

## 2022-06-16 DIAGNOSIS — R8290 Unspecified abnormal findings in urine: Secondary | ICD-10-CM | POA: Diagnosis not present

## 2022-06-16 DIAGNOSIS — Z992 Dependence on renal dialysis: Secondary | ICD-10-CM | POA: Diagnosis not present

## 2022-06-17 DIAGNOSIS — N2581 Secondary hyperparathyroidism of renal origin: Secondary | ICD-10-CM | POA: Diagnosis not present

## 2022-06-17 DIAGNOSIS — Z992 Dependence on renal dialysis: Secondary | ICD-10-CM | POA: Diagnosis not present

## 2022-06-17 DIAGNOSIS — K769 Liver disease, unspecified: Secondary | ICD-10-CM | POA: Diagnosis not present

## 2022-06-17 DIAGNOSIS — D631 Anemia in chronic kidney disease: Secondary | ICD-10-CM | POA: Diagnosis not present

## 2022-06-17 DIAGNOSIS — R8290 Unspecified abnormal findings in urine: Secondary | ICD-10-CM | POA: Diagnosis not present

## 2022-06-17 DIAGNOSIS — Z4932 Encounter for adequacy testing for peritoneal dialysis: Secondary | ICD-10-CM | POA: Diagnosis not present

## 2022-06-17 DIAGNOSIS — N186 End stage renal disease: Secondary | ICD-10-CM | POA: Diagnosis not present

## 2022-06-18 DIAGNOSIS — Z992 Dependence on renal dialysis: Secondary | ICD-10-CM | POA: Diagnosis not present

## 2022-06-18 DIAGNOSIS — K769 Liver disease, unspecified: Secondary | ICD-10-CM | POA: Diagnosis not present

## 2022-06-18 DIAGNOSIS — N2581 Secondary hyperparathyroidism of renal origin: Secondary | ICD-10-CM | POA: Diagnosis not present

## 2022-06-18 DIAGNOSIS — R8290 Unspecified abnormal findings in urine: Secondary | ICD-10-CM | POA: Diagnosis not present

## 2022-06-18 DIAGNOSIS — N186 End stage renal disease: Secondary | ICD-10-CM | POA: Diagnosis not present

## 2022-06-18 DIAGNOSIS — Z4932 Encounter for adequacy testing for peritoneal dialysis: Secondary | ICD-10-CM | POA: Diagnosis not present

## 2022-06-18 DIAGNOSIS — D631 Anemia in chronic kidney disease: Secondary | ICD-10-CM | POA: Diagnosis not present

## 2022-06-19 DIAGNOSIS — R8290 Unspecified abnormal findings in urine: Secondary | ICD-10-CM | POA: Diagnosis not present

## 2022-06-19 DIAGNOSIS — K769 Liver disease, unspecified: Secondary | ICD-10-CM | POA: Diagnosis not present

## 2022-06-19 DIAGNOSIS — Z992 Dependence on renal dialysis: Secondary | ICD-10-CM | POA: Diagnosis not present

## 2022-06-19 DIAGNOSIS — N2581 Secondary hyperparathyroidism of renal origin: Secondary | ICD-10-CM | POA: Diagnosis not present

## 2022-06-19 DIAGNOSIS — D631 Anemia in chronic kidney disease: Secondary | ICD-10-CM | POA: Diagnosis not present

## 2022-06-19 DIAGNOSIS — Z4932 Encounter for adequacy testing for peritoneal dialysis: Secondary | ICD-10-CM | POA: Diagnosis not present

## 2022-06-19 DIAGNOSIS — N186 End stage renal disease: Secondary | ICD-10-CM | POA: Diagnosis not present

## 2022-06-20 DIAGNOSIS — Z992 Dependence on renal dialysis: Secondary | ICD-10-CM | POA: Diagnosis not present

## 2022-06-20 DIAGNOSIS — N2581 Secondary hyperparathyroidism of renal origin: Secondary | ICD-10-CM | POA: Diagnosis not present

## 2022-06-20 DIAGNOSIS — Z4932 Encounter for adequacy testing for peritoneal dialysis: Secondary | ICD-10-CM | POA: Diagnosis not present

## 2022-06-20 DIAGNOSIS — R8290 Unspecified abnormal findings in urine: Secondary | ICD-10-CM | POA: Diagnosis not present

## 2022-06-20 DIAGNOSIS — D631 Anemia in chronic kidney disease: Secondary | ICD-10-CM | POA: Diagnosis not present

## 2022-06-20 DIAGNOSIS — K769 Liver disease, unspecified: Secondary | ICD-10-CM | POA: Diagnosis not present

## 2022-06-20 DIAGNOSIS — N186 End stage renal disease: Secondary | ICD-10-CM | POA: Diagnosis not present

## 2022-06-21 DIAGNOSIS — D631 Anemia in chronic kidney disease: Secondary | ICD-10-CM | POA: Diagnosis not present

## 2022-06-21 DIAGNOSIS — Z4932 Encounter for adequacy testing for peritoneal dialysis: Secondary | ICD-10-CM | POA: Diagnosis not present

## 2022-06-21 DIAGNOSIS — R8290 Unspecified abnormal findings in urine: Secondary | ICD-10-CM | POA: Diagnosis not present

## 2022-06-21 DIAGNOSIS — Z992 Dependence on renal dialysis: Secondary | ICD-10-CM | POA: Diagnosis not present

## 2022-06-21 DIAGNOSIS — N186 End stage renal disease: Secondary | ICD-10-CM | POA: Diagnosis not present

## 2022-06-21 DIAGNOSIS — N2581 Secondary hyperparathyroidism of renal origin: Secondary | ICD-10-CM | POA: Diagnosis not present

## 2022-06-21 DIAGNOSIS — K769 Liver disease, unspecified: Secondary | ICD-10-CM | POA: Diagnosis not present

## 2022-06-22 DIAGNOSIS — Z992 Dependence on renal dialysis: Secondary | ICD-10-CM | POA: Diagnosis not present

## 2022-06-22 DIAGNOSIS — N2581 Secondary hyperparathyroidism of renal origin: Secondary | ICD-10-CM | POA: Diagnosis not present

## 2022-06-22 DIAGNOSIS — N186 End stage renal disease: Secondary | ICD-10-CM | POA: Diagnosis not present

## 2022-06-22 DIAGNOSIS — D631 Anemia in chronic kidney disease: Secondary | ICD-10-CM | POA: Diagnosis not present

## 2022-06-22 DIAGNOSIS — Z4932 Encounter for adequacy testing for peritoneal dialysis: Secondary | ICD-10-CM | POA: Diagnosis not present

## 2022-06-22 DIAGNOSIS — K769 Liver disease, unspecified: Secondary | ICD-10-CM | POA: Diagnosis not present

## 2022-06-22 DIAGNOSIS — R8290 Unspecified abnormal findings in urine: Secondary | ICD-10-CM | POA: Diagnosis not present

## 2022-06-23 DIAGNOSIS — R8290 Unspecified abnormal findings in urine: Secondary | ICD-10-CM | POA: Diagnosis not present

## 2022-06-23 DIAGNOSIS — Z4932 Encounter for adequacy testing for peritoneal dialysis: Secondary | ICD-10-CM | POA: Diagnosis not present

## 2022-06-23 DIAGNOSIS — D631 Anemia in chronic kidney disease: Secondary | ICD-10-CM | POA: Diagnosis not present

## 2022-06-23 DIAGNOSIS — N186 End stage renal disease: Secondary | ICD-10-CM | POA: Diagnosis not present

## 2022-06-23 DIAGNOSIS — N2581 Secondary hyperparathyroidism of renal origin: Secondary | ICD-10-CM | POA: Diagnosis not present

## 2022-06-23 DIAGNOSIS — Z992 Dependence on renal dialysis: Secondary | ICD-10-CM | POA: Diagnosis not present

## 2022-06-23 DIAGNOSIS — K769 Liver disease, unspecified: Secondary | ICD-10-CM | POA: Diagnosis not present

## 2022-06-24 DIAGNOSIS — Z4932 Encounter for adequacy testing for peritoneal dialysis: Secondary | ICD-10-CM | POA: Diagnosis not present

## 2022-06-24 DIAGNOSIS — N186 End stage renal disease: Secondary | ICD-10-CM | POA: Diagnosis not present

## 2022-06-24 DIAGNOSIS — K769 Liver disease, unspecified: Secondary | ICD-10-CM | POA: Diagnosis not present

## 2022-06-24 DIAGNOSIS — N2581 Secondary hyperparathyroidism of renal origin: Secondary | ICD-10-CM | POA: Diagnosis not present

## 2022-06-24 DIAGNOSIS — R8290 Unspecified abnormal findings in urine: Secondary | ICD-10-CM | POA: Diagnosis not present

## 2022-06-24 DIAGNOSIS — Z992 Dependence on renal dialysis: Secondary | ICD-10-CM | POA: Diagnosis not present

## 2022-06-24 DIAGNOSIS — D631 Anemia in chronic kidney disease: Secondary | ICD-10-CM | POA: Diagnosis not present

## 2022-06-25 DIAGNOSIS — N186 End stage renal disease: Secondary | ICD-10-CM | POA: Diagnosis not present

## 2022-06-25 DIAGNOSIS — Z4932 Encounter for adequacy testing for peritoneal dialysis: Secondary | ICD-10-CM | POA: Diagnosis not present

## 2022-06-25 DIAGNOSIS — Z992 Dependence on renal dialysis: Secondary | ICD-10-CM | POA: Diagnosis not present

## 2022-06-25 DIAGNOSIS — K769 Liver disease, unspecified: Secondary | ICD-10-CM | POA: Diagnosis not present

## 2022-06-25 DIAGNOSIS — D631 Anemia in chronic kidney disease: Secondary | ICD-10-CM | POA: Diagnosis not present

## 2022-06-25 DIAGNOSIS — R8290 Unspecified abnormal findings in urine: Secondary | ICD-10-CM | POA: Diagnosis not present

## 2022-06-25 DIAGNOSIS — N2581 Secondary hyperparathyroidism of renal origin: Secondary | ICD-10-CM | POA: Diagnosis not present

## 2022-06-26 DIAGNOSIS — K769 Liver disease, unspecified: Secondary | ICD-10-CM | POA: Diagnosis not present

## 2022-06-26 DIAGNOSIS — D631 Anemia in chronic kidney disease: Secondary | ICD-10-CM | POA: Diagnosis not present

## 2022-06-26 DIAGNOSIS — N186 End stage renal disease: Secondary | ICD-10-CM | POA: Diagnosis not present

## 2022-06-26 DIAGNOSIS — Z992 Dependence on renal dialysis: Secondary | ICD-10-CM | POA: Diagnosis not present

## 2022-06-26 DIAGNOSIS — Z4932 Encounter for adequacy testing for peritoneal dialysis: Secondary | ICD-10-CM | POA: Diagnosis not present

## 2022-06-26 DIAGNOSIS — R8290 Unspecified abnormal findings in urine: Secondary | ICD-10-CM | POA: Diagnosis not present

## 2022-06-26 DIAGNOSIS — N2581 Secondary hyperparathyroidism of renal origin: Secondary | ICD-10-CM | POA: Diagnosis not present

## 2022-06-27 DIAGNOSIS — Z4932 Encounter for adequacy testing for peritoneal dialysis: Secondary | ICD-10-CM | POA: Diagnosis not present

## 2022-06-27 DIAGNOSIS — Z992 Dependence on renal dialysis: Secondary | ICD-10-CM | POA: Diagnosis not present

## 2022-06-27 DIAGNOSIS — K769 Liver disease, unspecified: Secondary | ICD-10-CM | POA: Diagnosis not present

## 2022-06-27 DIAGNOSIS — D631 Anemia in chronic kidney disease: Secondary | ICD-10-CM | POA: Diagnosis not present

## 2022-06-27 DIAGNOSIS — N186 End stage renal disease: Secondary | ICD-10-CM | POA: Diagnosis not present

## 2022-06-27 DIAGNOSIS — N2581 Secondary hyperparathyroidism of renal origin: Secondary | ICD-10-CM | POA: Diagnosis not present

## 2022-06-27 DIAGNOSIS — R8290 Unspecified abnormal findings in urine: Secondary | ICD-10-CM | POA: Diagnosis not present

## 2022-06-28 DIAGNOSIS — K769 Liver disease, unspecified: Secondary | ICD-10-CM | POA: Diagnosis not present

## 2022-06-28 DIAGNOSIS — Z4932 Encounter for adequacy testing for peritoneal dialysis: Secondary | ICD-10-CM | POA: Diagnosis not present

## 2022-06-28 DIAGNOSIS — Z992 Dependence on renal dialysis: Secondary | ICD-10-CM | POA: Diagnosis not present

## 2022-06-28 DIAGNOSIS — R8290 Unspecified abnormal findings in urine: Secondary | ICD-10-CM | POA: Diagnosis not present

## 2022-06-28 DIAGNOSIS — D631 Anemia in chronic kidney disease: Secondary | ICD-10-CM | POA: Diagnosis not present

## 2022-06-28 DIAGNOSIS — N2581 Secondary hyperparathyroidism of renal origin: Secondary | ICD-10-CM | POA: Diagnosis not present

## 2022-06-28 DIAGNOSIS — N186 End stage renal disease: Secondary | ICD-10-CM | POA: Diagnosis not present

## 2022-06-29 DIAGNOSIS — Z992 Dependence on renal dialysis: Secondary | ICD-10-CM | POA: Diagnosis not present

## 2022-06-29 DIAGNOSIS — D631 Anemia in chronic kidney disease: Secondary | ICD-10-CM | POA: Diagnosis not present

## 2022-06-29 DIAGNOSIS — K769 Liver disease, unspecified: Secondary | ICD-10-CM | POA: Diagnosis not present

## 2022-06-29 DIAGNOSIS — R8290 Unspecified abnormal findings in urine: Secondary | ICD-10-CM | POA: Diagnosis not present

## 2022-06-29 DIAGNOSIS — Z4932 Encounter for adequacy testing for peritoneal dialysis: Secondary | ICD-10-CM | POA: Diagnosis not present

## 2022-06-29 DIAGNOSIS — N2581 Secondary hyperparathyroidism of renal origin: Secondary | ICD-10-CM | POA: Diagnosis not present

## 2022-06-29 DIAGNOSIS — N186 End stage renal disease: Secondary | ICD-10-CM | POA: Diagnosis not present

## 2022-06-30 DIAGNOSIS — N2581 Secondary hyperparathyroidism of renal origin: Secondary | ICD-10-CM | POA: Diagnosis not present

## 2022-06-30 DIAGNOSIS — N186 End stage renal disease: Secondary | ICD-10-CM | POA: Diagnosis not present

## 2022-06-30 DIAGNOSIS — K769 Liver disease, unspecified: Secondary | ICD-10-CM | POA: Diagnosis not present

## 2022-06-30 DIAGNOSIS — Z4932 Encounter for adequacy testing for peritoneal dialysis: Secondary | ICD-10-CM | POA: Diagnosis not present

## 2022-06-30 DIAGNOSIS — D631 Anemia in chronic kidney disease: Secondary | ICD-10-CM | POA: Diagnosis not present

## 2022-06-30 DIAGNOSIS — R8290 Unspecified abnormal findings in urine: Secondary | ICD-10-CM | POA: Diagnosis not present

## 2022-06-30 DIAGNOSIS — Z992 Dependence on renal dialysis: Secondary | ICD-10-CM | POA: Diagnosis not present

## 2022-07-01 DIAGNOSIS — K769 Liver disease, unspecified: Secondary | ICD-10-CM | POA: Diagnosis not present

## 2022-07-01 DIAGNOSIS — R8290 Unspecified abnormal findings in urine: Secondary | ICD-10-CM | POA: Diagnosis not present

## 2022-07-01 DIAGNOSIS — Z992 Dependence on renal dialysis: Secondary | ICD-10-CM | POA: Diagnosis not present

## 2022-07-01 DIAGNOSIS — D631 Anemia in chronic kidney disease: Secondary | ICD-10-CM | POA: Diagnosis not present

## 2022-07-01 DIAGNOSIS — N2581 Secondary hyperparathyroidism of renal origin: Secondary | ICD-10-CM | POA: Diagnosis not present

## 2022-07-01 DIAGNOSIS — N186 End stage renal disease: Secondary | ICD-10-CM | POA: Diagnosis not present

## 2022-07-01 DIAGNOSIS — Z4932 Encounter for adequacy testing for peritoneal dialysis: Secondary | ICD-10-CM | POA: Diagnosis not present

## 2022-07-02 DIAGNOSIS — Z992 Dependence on renal dialysis: Secondary | ICD-10-CM | POA: Diagnosis not present

## 2022-07-02 DIAGNOSIS — D631 Anemia in chronic kidney disease: Secondary | ICD-10-CM | POA: Diagnosis not present

## 2022-07-02 DIAGNOSIS — R8290 Unspecified abnormal findings in urine: Secondary | ICD-10-CM | POA: Diagnosis not present

## 2022-07-02 DIAGNOSIS — N186 End stage renal disease: Secondary | ICD-10-CM | POA: Diagnosis not present

## 2022-07-02 DIAGNOSIS — K769 Liver disease, unspecified: Secondary | ICD-10-CM | POA: Diagnosis not present

## 2022-07-02 DIAGNOSIS — N2581 Secondary hyperparathyroidism of renal origin: Secondary | ICD-10-CM | POA: Diagnosis not present

## 2022-07-02 DIAGNOSIS — Z4932 Encounter for adequacy testing for peritoneal dialysis: Secondary | ICD-10-CM | POA: Diagnosis not present

## 2022-07-03 DIAGNOSIS — N186 End stage renal disease: Secondary | ICD-10-CM | POA: Diagnosis not present

## 2022-07-03 DIAGNOSIS — R8290 Unspecified abnormal findings in urine: Secondary | ICD-10-CM | POA: Diagnosis not present

## 2022-07-03 DIAGNOSIS — Z4932 Encounter for adequacy testing for peritoneal dialysis: Secondary | ICD-10-CM | POA: Diagnosis not present

## 2022-07-03 DIAGNOSIS — K769 Liver disease, unspecified: Secondary | ICD-10-CM | POA: Diagnosis not present

## 2022-07-03 DIAGNOSIS — Z992 Dependence on renal dialysis: Secondary | ICD-10-CM | POA: Diagnosis not present

## 2022-07-03 DIAGNOSIS — D631 Anemia in chronic kidney disease: Secondary | ICD-10-CM | POA: Diagnosis not present

## 2022-07-03 DIAGNOSIS — N2581 Secondary hyperparathyroidism of renal origin: Secondary | ICD-10-CM | POA: Diagnosis not present

## 2022-07-04 DIAGNOSIS — Z4932 Encounter for adequacy testing for peritoneal dialysis: Secondary | ICD-10-CM | POA: Diagnosis not present

## 2022-07-04 DIAGNOSIS — D631 Anemia in chronic kidney disease: Secondary | ICD-10-CM | POA: Diagnosis not present

## 2022-07-04 DIAGNOSIS — N2581 Secondary hyperparathyroidism of renal origin: Secondary | ICD-10-CM | POA: Diagnosis not present

## 2022-07-04 DIAGNOSIS — K769 Liver disease, unspecified: Secondary | ICD-10-CM | POA: Diagnosis not present

## 2022-07-04 DIAGNOSIS — Z992 Dependence on renal dialysis: Secondary | ICD-10-CM | POA: Diagnosis not present

## 2022-07-04 DIAGNOSIS — N186 End stage renal disease: Secondary | ICD-10-CM | POA: Diagnosis not present

## 2022-07-04 DIAGNOSIS — R8290 Unspecified abnormal findings in urine: Secondary | ICD-10-CM | POA: Diagnosis not present

## 2022-07-05 DIAGNOSIS — N186 End stage renal disease: Secondary | ICD-10-CM | POA: Diagnosis not present

## 2022-07-05 DIAGNOSIS — Z4932 Encounter for adequacy testing for peritoneal dialysis: Secondary | ICD-10-CM | POA: Diagnosis not present

## 2022-07-05 DIAGNOSIS — Z992 Dependence on renal dialysis: Secondary | ICD-10-CM | POA: Diagnosis not present

## 2022-07-05 DIAGNOSIS — R8290 Unspecified abnormal findings in urine: Secondary | ICD-10-CM | POA: Diagnosis not present

## 2022-07-05 DIAGNOSIS — D631 Anemia in chronic kidney disease: Secondary | ICD-10-CM | POA: Diagnosis not present

## 2022-07-05 DIAGNOSIS — K769 Liver disease, unspecified: Secondary | ICD-10-CM | POA: Diagnosis not present

## 2022-07-05 DIAGNOSIS — N2581 Secondary hyperparathyroidism of renal origin: Secondary | ICD-10-CM | POA: Diagnosis not present

## 2022-07-06 DIAGNOSIS — N186 End stage renal disease: Secondary | ICD-10-CM | POA: Diagnosis not present

## 2022-07-06 DIAGNOSIS — Z4932 Encounter for adequacy testing for peritoneal dialysis: Secondary | ICD-10-CM | POA: Diagnosis not present

## 2022-07-06 DIAGNOSIS — N2581 Secondary hyperparathyroidism of renal origin: Secondary | ICD-10-CM | POA: Diagnosis not present

## 2022-07-06 DIAGNOSIS — D631 Anemia in chronic kidney disease: Secondary | ICD-10-CM | POA: Diagnosis not present

## 2022-07-06 DIAGNOSIS — K769 Liver disease, unspecified: Secondary | ICD-10-CM | POA: Diagnosis not present

## 2022-07-06 DIAGNOSIS — R8290 Unspecified abnormal findings in urine: Secondary | ICD-10-CM | POA: Diagnosis not present

## 2022-07-06 DIAGNOSIS — Z992 Dependence on renal dialysis: Secondary | ICD-10-CM | POA: Diagnosis not present

## 2022-07-07 DIAGNOSIS — Z992 Dependence on renal dialysis: Secondary | ICD-10-CM | POA: Diagnosis not present

## 2022-07-07 DIAGNOSIS — N2581 Secondary hyperparathyroidism of renal origin: Secondary | ICD-10-CM | POA: Diagnosis not present

## 2022-07-07 DIAGNOSIS — Z4932 Encounter for adequacy testing for peritoneal dialysis: Secondary | ICD-10-CM | POA: Diagnosis not present

## 2022-07-07 DIAGNOSIS — R8290 Unspecified abnormal findings in urine: Secondary | ICD-10-CM | POA: Diagnosis not present

## 2022-07-07 DIAGNOSIS — D631 Anemia in chronic kidney disease: Secondary | ICD-10-CM | POA: Diagnosis not present

## 2022-07-07 DIAGNOSIS — K769 Liver disease, unspecified: Secondary | ICD-10-CM | POA: Diagnosis not present

## 2022-07-07 DIAGNOSIS — N186 End stage renal disease: Secondary | ICD-10-CM | POA: Diagnosis not present

## 2022-07-08 DIAGNOSIS — R8290 Unspecified abnormal findings in urine: Secondary | ICD-10-CM | POA: Diagnosis not present

## 2022-07-08 DIAGNOSIS — Z4932 Encounter for adequacy testing for peritoneal dialysis: Secondary | ICD-10-CM | POA: Diagnosis not present

## 2022-07-08 DIAGNOSIS — K769 Liver disease, unspecified: Secondary | ICD-10-CM | POA: Diagnosis not present

## 2022-07-08 DIAGNOSIS — Z992 Dependence on renal dialysis: Secondary | ICD-10-CM | POA: Diagnosis not present

## 2022-07-08 DIAGNOSIS — N186 End stage renal disease: Secondary | ICD-10-CM | POA: Diagnosis not present

## 2022-07-08 DIAGNOSIS — N2581 Secondary hyperparathyroidism of renal origin: Secondary | ICD-10-CM | POA: Diagnosis not present

## 2022-07-08 DIAGNOSIS — D631 Anemia in chronic kidney disease: Secondary | ICD-10-CM | POA: Diagnosis not present

## 2022-07-09 DIAGNOSIS — N186 End stage renal disease: Secondary | ICD-10-CM | POA: Diagnosis not present

## 2022-07-09 DIAGNOSIS — K769 Liver disease, unspecified: Secondary | ICD-10-CM | POA: Diagnosis not present

## 2022-07-09 DIAGNOSIS — Z4932 Encounter for adequacy testing for peritoneal dialysis: Secondary | ICD-10-CM | POA: Diagnosis not present

## 2022-07-09 DIAGNOSIS — D631 Anemia in chronic kidney disease: Secondary | ICD-10-CM | POA: Diagnosis not present

## 2022-07-09 DIAGNOSIS — Z992 Dependence on renal dialysis: Secondary | ICD-10-CM | POA: Diagnosis not present

## 2022-07-09 DIAGNOSIS — N2581 Secondary hyperparathyroidism of renal origin: Secondary | ICD-10-CM | POA: Diagnosis not present

## 2022-07-09 DIAGNOSIS — R8290 Unspecified abnormal findings in urine: Secondary | ICD-10-CM | POA: Diagnosis not present

## 2022-07-10 DIAGNOSIS — N186 End stage renal disease: Secondary | ICD-10-CM | POA: Diagnosis not present

## 2022-07-10 DIAGNOSIS — Z992 Dependence on renal dialysis: Secondary | ICD-10-CM | POA: Diagnosis not present

## 2022-07-10 DIAGNOSIS — K769 Liver disease, unspecified: Secondary | ICD-10-CM | POA: Diagnosis not present

## 2022-07-10 DIAGNOSIS — D631 Anemia in chronic kidney disease: Secondary | ICD-10-CM | POA: Diagnosis not present

## 2022-07-10 DIAGNOSIS — Z4932 Encounter for adequacy testing for peritoneal dialysis: Secondary | ICD-10-CM | POA: Diagnosis not present

## 2022-07-10 DIAGNOSIS — N2581 Secondary hyperparathyroidism of renal origin: Secondary | ICD-10-CM | POA: Diagnosis not present

## 2022-07-10 DIAGNOSIS — R8290 Unspecified abnormal findings in urine: Secondary | ICD-10-CM | POA: Diagnosis not present

## 2022-07-11 DIAGNOSIS — N2581 Secondary hyperparathyroidism of renal origin: Secondary | ICD-10-CM | POA: Diagnosis not present

## 2022-07-11 DIAGNOSIS — N186 End stage renal disease: Secondary | ICD-10-CM | POA: Diagnosis not present

## 2022-07-11 DIAGNOSIS — Z992 Dependence on renal dialysis: Secondary | ICD-10-CM | POA: Diagnosis not present

## 2022-07-11 DIAGNOSIS — Z4932 Encounter for adequacy testing for peritoneal dialysis: Secondary | ICD-10-CM | POA: Diagnosis not present

## 2022-07-11 DIAGNOSIS — K769 Liver disease, unspecified: Secondary | ICD-10-CM | POA: Diagnosis not present

## 2022-07-11 DIAGNOSIS — R8290 Unspecified abnormal findings in urine: Secondary | ICD-10-CM | POA: Diagnosis not present

## 2022-07-11 DIAGNOSIS — D631 Anemia in chronic kidney disease: Secondary | ICD-10-CM | POA: Diagnosis not present

## 2022-07-12 DIAGNOSIS — Z4932 Encounter for adequacy testing for peritoneal dialysis: Secondary | ICD-10-CM | POA: Diagnosis not present

## 2022-07-12 DIAGNOSIS — K769 Liver disease, unspecified: Secondary | ICD-10-CM | POA: Diagnosis not present

## 2022-07-12 DIAGNOSIS — I129 Hypertensive chronic kidney disease with stage 1 through stage 4 chronic kidney disease, or unspecified chronic kidney disease: Secondary | ICD-10-CM | POA: Diagnosis not present

## 2022-07-12 DIAGNOSIS — Z992 Dependence on renal dialysis: Secondary | ICD-10-CM | POA: Diagnosis not present

## 2022-07-12 DIAGNOSIS — N186 End stage renal disease: Secondary | ICD-10-CM | POA: Diagnosis not present

## 2022-07-12 DIAGNOSIS — N2581 Secondary hyperparathyroidism of renal origin: Secondary | ICD-10-CM | POA: Diagnosis not present

## 2022-07-12 DIAGNOSIS — R8290 Unspecified abnormal findings in urine: Secondary | ICD-10-CM | POA: Diagnosis not present

## 2022-07-12 DIAGNOSIS — D631 Anemia in chronic kidney disease: Secondary | ICD-10-CM | POA: Diagnosis not present

## 2022-07-13 DIAGNOSIS — K769 Liver disease, unspecified: Secondary | ICD-10-CM | POA: Diagnosis not present

## 2022-07-13 DIAGNOSIS — N2581 Secondary hyperparathyroidism of renal origin: Secondary | ICD-10-CM | POA: Diagnosis not present

## 2022-07-13 DIAGNOSIS — N186 End stage renal disease: Secondary | ICD-10-CM | POA: Diagnosis not present

## 2022-07-13 DIAGNOSIS — D631 Anemia in chronic kidney disease: Secondary | ICD-10-CM | POA: Diagnosis not present

## 2022-07-13 DIAGNOSIS — Z4932 Encounter for adequacy testing for peritoneal dialysis: Secondary | ICD-10-CM | POA: Diagnosis not present

## 2022-07-13 DIAGNOSIS — R8290 Unspecified abnormal findings in urine: Secondary | ICD-10-CM | POA: Diagnosis not present

## 2022-07-13 DIAGNOSIS — Z992 Dependence on renal dialysis: Secondary | ICD-10-CM | POA: Diagnosis not present

## 2022-07-14 DIAGNOSIS — K769 Liver disease, unspecified: Secondary | ICD-10-CM | POA: Diagnosis not present

## 2022-07-14 DIAGNOSIS — Z992 Dependence on renal dialysis: Secondary | ICD-10-CM | POA: Diagnosis not present

## 2022-07-14 DIAGNOSIS — Z4932 Encounter for adequacy testing for peritoneal dialysis: Secondary | ICD-10-CM | POA: Diagnosis not present

## 2022-07-14 DIAGNOSIS — R8290 Unspecified abnormal findings in urine: Secondary | ICD-10-CM | POA: Diagnosis not present

## 2022-07-14 DIAGNOSIS — N2581 Secondary hyperparathyroidism of renal origin: Secondary | ICD-10-CM | POA: Diagnosis not present

## 2022-07-14 DIAGNOSIS — N186 End stage renal disease: Secondary | ICD-10-CM | POA: Diagnosis not present

## 2022-07-14 DIAGNOSIS — D631 Anemia in chronic kidney disease: Secondary | ICD-10-CM | POA: Diagnosis not present

## 2022-07-15 DIAGNOSIS — Z4932 Encounter for adequacy testing for peritoneal dialysis: Secondary | ICD-10-CM | POA: Diagnosis not present

## 2022-07-15 DIAGNOSIS — N2581 Secondary hyperparathyroidism of renal origin: Secondary | ICD-10-CM | POA: Diagnosis not present

## 2022-07-15 DIAGNOSIS — N186 End stage renal disease: Secondary | ICD-10-CM | POA: Diagnosis not present

## 2022-07-15 DIAGNOSIS — R8290 Unspecified abnormal findings in urine: Secondary | ICD-10-CM | POA: Diagnosis not present

## 2022-07-15 DIAGNOSIS — Z992 Dependence on renal dialysis: Secondary | ICD-10-CM | POA: Diagnosis not present

## 2022-07-15 DIAGNOSIS — K769 Liver disease, unspecified: Secondary | ICD-10-CM | POA: Diagnosis not present

## 2022-07-15 DIAGNOSIS — D631 Anemia in chronic kidney disease: Secondary | ICD-10-CM | POA: Diagnosis not present

## 2022-07-16 DIAGNOSIS — D631 Anemia in chronic kidney disease: Secondary | ICD-10-CM | POA: Diagnosis not present

## 2022-07-16 DIAGNOSIS — K769 Liver disease, unspecified: Secondary | ICD-10-CM | POA: Diagnosis not present

## 2022-07-16 DIAGNOSIS — R8290 Unspecified abnormal findings in urine: Secondary | ICD-10-CM | POA: Diagnosis not present

## 2022-07-16 DIAGNOSIS — Z992 Dependence on renal dialysis: Secondary | ICD-10-CM | POA: Diagnosis not present

## 2022-07-16 DIAGNOSIS — N2581 Secondary hyperparathyroidism of renal origin: Secondary | ICD-10-CM | POA: Diagnosis not present

## 2022-07-16 DIAGNOSIS — Z4932 Encounter for adequacy testing for peritoneal dialysis: Secondary | ICD-10-CM | POA: Diagnosis not present

## 2022-07-16 DIAGNOSIS — N186 End stage renal disease: Secondary | ICD-10-CM | POA: Diagnosis not present

## 2022-07-17 DIAGNOSIS — N186 End stage renal disease: Secondary | ICD-10-CM | POA: Diagnosis not present

## 2022-07-17 DIAGNOSIS — N2581 Secondary hyperparathyroidism of renal origin: Secondary | ICD-10-CM | POA: Diagnosis not present

## 2022-07-17 DIAGNOSIS — Z4932 Encounter for adequacy testing for peritoneal dialysis: Secondary | ICD-10-CM | POA: Diagnosis not present

## 2022-07-17 DIAGNOSIS — R8290 Unspecified abnormal findings in urine: Secondary | ICD-10-CM | POA: Diagnosis not present

## 2022-07-17 DIAGNOSIS — D631 Anemia in chronic kidney disease: Secondary | ICD-10-CM | POA: Diagnosis not present

## 2022-07-17 DIAGNOSIS — K769 Liver disease, unspecified: Secondary | ICD-10-CM | POA: Diagnosis not present

## 2022-07-17 DIAGNOSIS — Z992 Dependence on renal dialysis: Secondary | ICD-10-CM | POA: Diagnosis not present

## 2022-07-18 DIAGNOSIS — R8290 Unspecified abnormal findings in urine: Secondary | ICD-10-CM | POA: Diagnosis not present

## 2022-07-18 DIAGNOSIS — D631 Anemia in chronic kidney disease: Secondary | ICD-10-CM | POA: Diagnosis not present

## 2022-07-18 DIAGNOSIS — N186 End stage renal disease: Secondary | ICD-10-CM | POA: Diagnosis not present

## 2022-07-18 DIAGNOSIS — K769 Liver disease, unspecified: Secondary | ICD-10-CM | POA: Diagnosis not present

## 2022-07-18 DIAGNOSIS — Z992 Dependence on renal dialysis: Secondary | ICD-10-CM | POA: Diagnosis not present

## 2022-07-18 DIAGNOSIS — Z4932 Encounter for adequacy testing for peritoneal dialysis: Secondary | ICD-10-CM | POA: Diagnosis not present

## 2022-07-18 DIAGNOSIS — N2581 Secondary hyperparathyroidism of renal origin: Secondary | ICD-10-CM | POA: Diagnosis not present

## 2022-07-19 DIAGNOSIS — R8290 Unspecified abnormal findings in urine: Secondary | ICD-10-CM | POA: Diagnosis not present

## 2022-07-19 DIAGNOSIS — K769 Liver disease, unspecified: Secondary | ICD-10-CM | POA: Diagnosis not present

## 2022-07-19 DIAGNOSIS — D631 Anemia in chronic kidney disease: Secondary | ICD-10-CM | POA: Diagnosis not present

## 2022-07-19 DIAGNOSIS — Z992 Dependence on renal dialysis: Secondary | ICD-10-CM | POA: Diagnosis not present

## 2022-07-19 DIAGNOSIS — N2581 Secondary hyperparathyroidism of renal origin: Secondary | ICD-10-CM | POA: Diagnosis not present

## 2022-07-19 DIAGNOSIS — Z4932 Encounter for adequacy testing for peritoneal dialysis: Secondary | ICD-10-CM | POA: Diagnosis not present

## 2022-07-19 DIAGNOSIS — N186 End stage renal disease: Secondary | ICD-10-CM | POA: Diagnosis not present

## 2022-07-20 DIAGNOSIS — R8290 Unspecified abnormal findings in urine: Secondary | ICD-10-CM | POA: Diagnosis not present

## 2022-07-20 DIAGNOSIS — Z992 Dependence on renal dialysis: Secondary | ICD-10-CM | POA: Diagnosis not present

## 2022-07-20 DIAGNOSIS — N186 End stage renal disease: Secondary | ICD-10-CM | POA: Diagnosis not present

## 2022-07-20 DIAGNOSIS — K769 Liver disease, unspecified: Secondary | ICD-10-CM | POA: Diagnosis not present

## 2022-07-20 DIAGNOSIS — Z4932 Encounter for adequacy testing for peritoneal dialysis: Secondary | ICD-10-CM | POA: Diagnosis not present

## 2022-07-20 DIAGNOSIS — D631 Anemia in chronic kidney disease: Secondary | ICD-10-CM | POA: Diagnosis not present

## 2022-07-20 DIAGNOSIS — N2581 Secondary hyperparathyroidism of renal origin: Secondary | ICD-10-CM | POA: Diagnosis not present

## 2022-07-21 DIAGNOSIS — R8290 Unspecified abnormal findings in urine: Secondary | ICD-10-CM | POA: Diagnosis not present

## 2022-07-21 DIAGNOSIS — Z4932 Encounter for adequacy testing for peritoneal dialysis: Secondary | ICD-10-CM | POA: Diagnosis not present

## 2022-07-21 DIAGNOSIS — N2581 Secondary hyperparathyroidism of renal origin: Secondary | ICD-10-CM | POA: Diagnosis not present

## 2022-07-21 DIAGNOSIS — K769 Liver disease, unspecified: Secondary | ICD-10-CM | POA: Diagnosis not present

## 2022-07-21 DIAGNOSIS — Z992 Dependence on renal dialysis: Secondary | ICD-10-CM | POA: Diagnosis not present

## 2022-07-21 DIAGNOSIS — N186 End stage renal disease: Secondary | ICD-10-CM | POA: Diagnosis not present

## 2022-07-21 DIAGNOSIS — D631 Anemia in chronic kidney disease: Secondary | ICD-10-CM | POA: Diagnosis not present

## 2022-07-22 DIAGNOSIS — Z4932 Encounter for adequacy testing for peritoneal dialysis: Secondary | ICD-10-CM | POA: Diagnosis not present

## 2022-07-22 DIAGNOSIS — N2581 Secondary hyperparathyroidism of renal origin: Secondary | ICD-10-CM | POA: Diagnosis not present

## 2022-07-22 DIAGNOSIS — Z992 Dependence on renal dialysis: Secondary | ICD-10-CM | POA: Diagnosis not present

## 2022-07-22 DIAGNOSIS — D631 Anemia in chronic kidney disease: Secondary | ICD-10-CM | POA: Diagnosis not present

## 2022-07-22 DIAGNOSIS — N186 End stage renal disease: Secondary | ICD-10-CM | POA: Diagnosis not present

## 2022-07-22 DIAGNOSIS — R8290 Unspecified abnormal findings in urine: Secondary | ICD-10-CM | POA: Diagnosis not present

## 2022-07-22 DIAGNOSIS — K769 Liver disease, unspecified: Secondary | ICD-10-CM | POA: Diagnosis not present

## 2022-07-23 DIAGNOSIS — D631 Anemia in chronic kidney disease: Secondary | ICD-10-CM | POA: Diagnosis not present

## 2022-07-23 DIAGNOSIS — N2581 Secondary hyperparathyroidism of renal origin: Secondary | ICD-10-CM | POA: Diagnosis not present

## 2022-07-23 DIAGNOSIS — Z992 Dependence on renal dialysis: Secondary | ICD-10-CM | POA: Diagnosis not present

## 2022-07-23 DIAGNOSIS — K769 Liver disease, unspecified: Secondary | ICD-10-CM | POA: Diagnosis not present

## 2022-07-23 DIAGNOSIS — Z4932 Encounter for adequacy testing for peritoneal dialysis: Secondary | ICD-10-CM | POA: Diagnosis not present

## 2022-07-23 DIAGNOSIS — R8290 Unspecified abnormal findings in urine: Secondary | ICD-10-CM | POA: Diagnosis not present

## 2022-07-23 DIAGNOSIS — N186 End stage renal disease: Secondary | ICD-10-CM | POA: Diagnosis not present

## 2022-07-24 DIAGNOSIS — R8290 Unspecified abnormal findings in urine: Secondary | ICD-10-CM | POA: Diagnosis not present

## 2022-07-24 DIAGNOSIS — Z992 Dependence on renal dialysis: Secondary | ICD-10-CM | POA: Diagnosis not present

## 2022-07-24 DIAGNOSIS — K769 Liver disease, unspecified: Secondary | ICD-10-CM | POA: Diagnosis not present

## 2022-07-24 DIAGNOSIS — D631 Anemia in chronic kidney disease: Secondary | ICD-10-CM | POA: Diagnosis not present

## 2022-07-24 DIAGNOSIS — N186 End stage renal disease: Secondary | ICD-10-CM | POA: Diagnosis not present

## 2022-07-24 DIAGNOSIS — N2581 Secondary hyperparathyroidism of renal origin: Secondary | ICD-10-CM | POA: Diagnosis not present

## 2022-07-24 DIAGNOSIS — Z4932 Encounter for adequacy testing for peritoneal dialysis: Secondary | ICD-10-CM | POA: Diagnosis not present

## 2022-07-25 DIAGNOSIS — N186 End stage renal disease: Secondary | ICD-10-CM | POA: Diagnosis not present

## 2022-07-25 DIAGNOSIS — R8290 Unspecified abnormal findings in urine: Secondary | ICD-10-CM | POA: Diagnosis not present

## 2022-07-25 DIAGNOSIS — K769 Liver disease, unspecified: Secondary | ICD-10-CM | POA: Diagnosis not present

## 2022-07-25 DIAGNOSIS — D631 Anemia in chronic kidney disease: Secondary | ICD-10-CM | POA: Diagnosis not present

## 2022-07-25 DIAGNOSIS — Z4932 Encounter for adequacy testing for peritoneal dialysis: Secondary | ICD-10-CM | POA: Diagnosis not present

## 2022-07-25 DIAGNOSIS — N2581 Secondary hyperparathyroidism of renal origin: Secondary | ICD-10-CM | POA: Diagnosis not present

## 2022-07-25 DIAGNOSIS — Z992 Dependence on renal dialysis: Secondary | ICD-10-CM | POA: Diagnosis not present

## 2022-07-26 DIAGNOSIS — Z992 Dependence on renal dialysis: Secondary | ICD-10-CM | POA: Diagnosis not present

## 2022-07-26 DIAGNOSIS — R8290 Unspecified abnormal findings in urine: Secondary | ICD-10-CM | POA: Diagnosis not present

## 2022-07-26 DIAGNOSIS — K769 Liver disease, unspecified: Secondary | ICD-10-CM | POA: Diagnosis not present

## 2022-07-26 DIAGNOSIS — Z4932 Encounter for adequacy testing for peritoneal dialysis: Secondary | ICD-10-CM | POA: Diagnosis not present

## 2022-07-26 DIAGNOSIS — N186 End stage renal disease: Secondary | ICD-10-CM | POA: Diagnosis not present

## 2022-07-26 DIAGNOSIS — N2581 Secondary hyperparathyroidism of renal origin: Secondary | ICD-10-CM | POA: Diagnosis not present

## 2022-07-26 DIAGNOSIS — D631 Anemia in chronic kidney disease: Secondary | ICD-10-CM | POA: Diagnosis not present

## 2022-07-27 DIAGNOSIS — N186 End stage renal disease: Secondary | ICD-10-CM | POA: Diagnosis not present

## 2022-07-27 DIAGNOSIS — N2581 Secondary hyperparathyroidism of renal origin: Secondary | ICD-10-CM | POA: Diagnosis not present

## 2022-07-27 DIAGNOSIS — Z992 Dependence on renal dialysis: Secondary | ICD-10-CM | POA: Diagnosis not present

## 2022-07-27 DIAGNOSIS — Z4932 Encounter for adequacy testing for peritoneal dialysis: Secondary | ICD-10-CM | POA: Diagnosis not present

## 2022-07-27 DIAGNOSIS — D631 Anemia in chronic kidney disease: Secondary | ICD-10-CM | POA: Diagnosis not present

## 2022-07-27 DIAGNOSIS — K769 Liver disease, unspecified: Secondary | ICD-10-CM | POA: Diagnosis not present

## 2022-07-27 DIAGNOSIS — R8290 Unspecified abnormal findings in urine: Secondary | ICD-10-CM | POA: Diagnosis not present

## 2022-07-28 DIAGNOSIS — D631 Anemia in chronic kidney disease: Secondary | ICD-10-CM | POA: Diagnosis not present

## 2022-07-28 DIAGNOSIS — N2581 Secondary hyperparathyroidism of renal origin: Secondary | ICD-10-CM | POA: Diagnosis not present

## 2022-07-28 DIAGNOSIS — K769 Liver disease, unspecified: Secondary | ICD-10-CM | POA: Diagnosis not present

## 2022-07-28 DIAGNOSIS — R8290 Unspecified abnormal findings in urine: Secondary | ICD-10-CM | POA: Diagnosis not present

## 2022-07-28 DIAGNOSIS — N186 End stage renal disease: Secondary | ICD-10-CM | POA: Diagnosis not present

## 2022-07-28 DIAGNOSIS — Z4932 Encounter for adequacy testing for peritoneal dialysis: Secondary | ICD-10-CM | POA: Diagnosis not present

## 2022-07-28 DIAGNOSIS — Z992 Dependence on renal dialysis: Secondary | ICD-10-CM | POA: Diagnosis not present

## 2022-07-29 DIAGNOSIS — K769 Liver disease, unspecified: Secondary | ICD-10-CM | POA: Diagnosis not present

## 2022-07-29 DIAGNOSIS — Z992 Dependence on renal dialysis: Secondary | ICD-10-CM | POA: Diagnosis not present

## 2022-07-29 DIAGNOSIS — N2581 Secondary hyperparathyroidism of renal origin: Secondary | ICD-10-CM | POA: Diagnosis not present

## 2022-07-29 DIAGNOSIS — N186 End stage renal disease: Secondary | ICD-10-CM | POA: Diagnosis not present

## 2022-07-29 DIAGNOSIS — D631 Anemia in chronic kidney disease: Secondary | ICD-10-CM | POA: Diagnosis not present

## 2022-07-29 DIAGNOSIS — Z4932 Encounter for adequacy testing for peritoneal dialysis: Secondary | ICD-10-CM | POA: Diagnosis not present

## 2022-07-29 DIAGNOSIS — R8290 Unspecified abnormal findings in urine: Secondary | ICD-10-CM | POA: Diagnosis not present

## 2022-07-30 DIAGNOSIS — D631 Anemia in chronic kidney disease: Secondary | ICD-10-CM | POA: Diagnosis not present

## 2022-07-30 DIAGNOSIS — Z992 Dependence on renal dialysis: Secondary | ICD-10-CM | POA: Diagnosis not present

## 2022-07-30 DIAGNOSIS — Z4932 Encounter for adequacy testing for peritoneal dialysis: Secondary | ICD-10-CM | POA: Diagnosis not present

## 2022-07-30 DIAGNOSIS — R8290 Unspecified abnormal findings in urine: Secondary | ICD-10-CM | POA: Diagnosis not present

## 2022-07-30 DIAGNOSIS — N186 End stage renal disease: Secondary | ICD-10-CM | POA: Diagnosis not present

## 2022-07-30 DIAGNOSIS — K769 Liver disease, unspecified: Secondary | ICD-10-CM | POA: Diagnosis not present

## 2022-07-30 DIAGNOSIS — N2581 Secondary hyperparathyroidism of renal origin: Secondary | ICD-10-CM | POA: Diagnosis not present

## 2022-07-31 DIAGNOSIS — Z992 Dependence on renal dialysis: Secondary | ICD-10-CM | POA: Diagnosis not present

## 2022-07-31 DIAGNOSIS — D631 Anemia in chronic kidney disease: Secondary | ICD-10-CM | POA: Diagnosis not present

## 2022-07-31 DIAGNOSIS — K769 Liver disease, unspecified: Secondary | ICD-10-CM | POA: Diagnosis not present

## 2022-07-31 DIAGNOSIS — N186 End stage renal disease: Secondary | ICD-10-CM | POA: Diagnosis not present

## 2022-07-31 DIAGNOSIS — N2581 Secondary hyperparathyroidism of renal origin: Secondary | ICD-10-CM | POA: Diagnosis not present

## 2022-07-31 DIAGNOSIS — Z4932 Encounter for adequacy testing for peritoneal dialysis: Secondary | ICD-10-CM | POA: Diagnosis not present

## 2022-07-31 DIAGNOSIS — R8290 Unspecified abnormal findings in urine: Secondary | ICD-10-CM | POA: Diagnosis not present

## 2022-08-01 DIAGNOSIS — Z992 Dependence on renal dialysis: Secondary | ICD-10-CM | POA: Diagnosis not present

## 2022-08-01 DIAGNOSIS — K769 Liver disease, unspecified: Secondary | ICD-10-CM | POA: Diagnosis not present

## 2022-08-01 DIAGNOSIS — R8290 Unspecified abnormal findings in urine: Secondary | ICD-10-CM | POA: Diagnosis not present

## 2022-08-01 DIAGNOSIS — N186 End stage renal disease: Secondary | ICD-10-CM | POA: Diagnosis not present

## 2022-08-01 DIAGNOSIS — D631 Anemia in chronic kidney disease: Secondary | ICD-10-CM | POA: Diagnosis not present

## 2022-08-01 DIAGNOSIS — Z4932 Encounter for adequacy testing for peritoneal dialysis: Secondary | ICD-10-CM | POA: Diagnosis not present

## 2022-08-01 DIAGNOSIS — N2581 Secondary hyperparathyroidism of renal origin: Secondary | ICD-10-CM | POA: Diagnosis not present

## 2022-08-02 DIAGNOSIS — R8290 Unspecified abnormal findings in urine: Secondary | ICD-10-CM | POA: Diagnosis not present

## 2022-08-02 DIAGNOSIS — Z4932 Encounter for adequacy testing for peritoneal dialysis: Secondary | ICD-10-CM | POA: Diagnosis not present

## 2022-08-02 DIAGNOSIS — Z992 Dependence on renal dialysis: Secondary | ICD-10-CM | POA: Diagnosis not present

## 2022-08-02 DIAGNOSIS — D631 Anemia in chronic kidney disease: Secondary | ICD-10-CM | POA: Diagnosis not present

## 2022-08-02 DIAGNOSIS — N2581 Secondary hyperparathyroidism of renal origin: Secondary | ICD-10-CM | POA: Diagnosis not present

## 2022-08-02 DIAGNOSIS — K769 Liver disease, unspecified: Secondary | ICD-10-CM | POA: Diagnosis not present

## 2022-08-02 DIAGNOSIS — N186 End stage renal disease: Secondary | ICD-10-CM | POA: Diagnosis not present

## 2022-08-03 DIAGNOSIS — Z992 Dependence on renal dialysis: Secondary | ICD-10-CM | POA: Diagnosis not present

## 2022-08-03 DIAGNOSIS — Z4932 Encounter for adequacy testing for peritoneal dialysis: Secondary | ICD-10-CM | POA: Diagnosis not present

## 2022-08-03 DIAGNOSIS — K769 Liver disease, unspecified: Secondary | ICD-10-CM | POA: Diagnosis not present

## 2022-08-03 DIAGNOSIS — N186 End stage renal disease: Secondary | ICD-10-CM | POA: Diagnosis not present

## 2022-08-03 DIAGNOSIS — R8290 Unspecified abnormal findings in urine: Secondary | ICD-10-CM | POA: Diagnosis not present

## 2022-08-03 DIAGNOSIS — D631 Anemia in chronic kidney disease: Secondary | ICD-10-CM | POA: Diagnosis not present

## 2022-08-03 DIAGNOSIS — N2581 Secondary hyperparathyroidism of renal origin: Secondary | ICD-10-CM | POA: Diagnosis not present

## 2022-08-04 DIAGNOSIS — Z4932 Encounter for adequacy testing for peritoneal dialysis: Secondary | ICD-10-CM | POA: Diagnosis not present

## 2022-08-04 DIAGNOSIS — D631 Anemia in chronic kidney disease: Secondary | ICD-10-CM | POA: Diagnosis not present

## 2022-08-04 DIAGNOSIS — N186 End stage renal disease: Secondary | ICD-10-CM | POA: Diagnosis not present

## 2022-08-04 DIAGNOSIS — Z992 Dependence on renal dialysis: Secondary | ICD-10-CM | POA: Diagnosis not present

## 2022-08-04 DIAGNOSIS — K769 Liver disease, unspecified: Secondary | ICD-10-CM | POA: Diagnosis not present

## 2022-08-04 DIAGNOSIS — N2581 Secondary hyperparathyroidism of renal origin: Secondary | ICD-10-CM | POA: Diagnosis not present

## 2022-08-04 DIAGNOSIS — R8290 Unspecified abnormal findings in urine: Secondary | ICD-10-CM | POA: Diagnosis not present

## 2022-08-05 DIAGNOSIS — N186 End stage renal disease: Secondary | ICD-10-CM | POA: Diagnosis not present

## 2022-08-05 DIAGNOSIS — N2581 Secondary hyperparathyroidism of renal origin: Secondary | ICD-10-CM | POA: Diagnosis not present

## 2022-08-05 DIAGNOSIS — K769 Liver disease, unspecified: Secondary | ICD-10-CM | POA: Diagnosis not present

## 2022-08-05 DIAGNOSIS — Z992 Dependence on renal dialysis: Secondary | ICD-10-CM | POA: Diagnosis not present

## 2022-08-05 DIAGNOSIS — D631 Anemia in chronic kidney disease: Secondary | ICD-10-CM | POA: Diagnosis not present

## 2022-08-05 DIAGNOSIS — R8290 Unspecified abnormal findings in urine: Secondary | ICD-10-CM | POA: Diagnosis not present

## 2022-08-05 DIAGNOSIS — Z4932 Encounter for adequacy testing for peritoneal dialysis: Secondary | ICD-10-CM | POA: Diagnosis not present

## 2022-08-06 DIAGNOSIS — N186 End stage renal disease: Secondary | ICD-10-CM | POA: Diagnosis not present

## 2022-08-06 DIAGNOSIS — D631 Anemia in chronic kidney disease: Secondary | ICD-10-CM | POA: Diagnosis not present

## 2022-08-06 DIAGNOSIS — Z992 Dependence on renal dialysis: Secondary | ICD-10-CM | POA: Diagnosis not present

## 2022-08-06 DIAGNOSIS — R8290 Unspecified abnormal findings in urine: Secondary | ICD-10-CM | POA: Diagnosis not present

## 2022-08-06 DIAGNOSIS — N2581 Secondary hyperparathyroidism of renal origin: Secondary | ICD-10-CM | POA: Diagnosis not present

## 2022-08-06 DIAGNOSIS — K769 Liver disease, unspecified: Secondary | ICD-10-CM | POA: Diagnosis not present

## 2022-08-06 DIAGNOSIS — Z4932 Encounter for adequacy testing for peritoneal dialysis: Secondary | ICD-10-CM | POA: Diagnosis not present

## 2022-08-07 DIAGNOSIS — Z992 Dependence on renal dialysis: Secondary | ICD-10-CM | POA: Diagnosis not present

## 2022-08-07 DIAGNOSIS — R8290 Unspecified abnormal findings in urine: Secondary | ICD-10-CM | POA: Diagnosis not present

## 2022-08-07 DIAGNOSIS — N186 End stage renal disease: Secondary | ICD-10-CM | POA: Diagnosis not present

## 2022-08-07 DIAGNOSIS — N2581 Secondary hyperparathyroidism of renal origin: Secondary | ICD-10-CM | POA: Diagnosis not present

## 2022-08-07 DIAGNOSIS — Z4932 Encounter for adequacy testing for peritoneal dialysis: Secondary | ICD-10-CM | POA: Diagnosis not present

## 2022-08-07 DIAGNOSIS — K769 Liver disease, unspecified: Secondary | ICD-10-CM | POA: Diagnosis not present

## 2022-08-07 DIAGNOSIS — D631 Anemia in chronic kidney disease: Secondary | ICD-10-CM | POA: Diagnosis not present

## 2022-08-08 DIAGNOSIS — Z992 Dependence on renal dialysis: Secondary | ICD-10-CM | POA: Diagnosis not present

## 2022-08-08 DIAGNOSIS — D631 Anemia in chronic kidney disease: Secondary | ICD-10-CM | POA: Diagnosis not present

## 2022-08-08 DIAGNOSIS — N2581 Secondary hyperparathyroidism of renal origin: Secondary | ICD-10-CM | POA: Diagnosis not present

## 2022-08-08 DIAGNOSIS — R8290 Unspecified abnormal findings in urine: Secondary | ICD-10-CM | POA: Diagnosis not present

## 2022-08-08 DIAGNOSIS — K769 Liver disease, unspecified: Secondary | ICD-10-CM | POA: Diagnosis not present

## 2022-08-08 DIAGNOSIS — Z4932 Encounter for adequacy testing for peritoneal dialysis: Secondary | ICD-10-CM | POA: Diagnosis not present

## 2022-08-08 DIAGNOSIS — N186 End stage renal disease: Secondary | ICD-10-CM | POA: Diagnosis not present

## 2022-08-09 DIAGNOSIS — Z992 Dependence on renal dialysis: Secondary | ICD-10-CM | POA: Diagnosis not present

## 2022-08-09 DIAGNOSIS — N2581 Secondary hyperparathyroidism of renal origin: Secondary | ICD-10-CM | POA: Diagnosis not present

## 2022-08-09 DIAGNOSIS — Z4932 Encounter for adequacy testing for peritoneal dialysis: Secondary | ICD-10-CM | POA: Diagnosis not present

## 2022-08-09 DIAGNOSIS — D631 Anemia in chronic kidney disease: Secondary | ICD-10-CM | POA: Diagnosis not present

## 2022-08-09 DIAGNOSIS — K769 Liver disease, unspecified: Secondary | ICD-10-CM | POA: Diagnosis not present

## 2022-08-09 DIAGNOSIS — N186 End stage renal disease: Secondary | ICD-10-CM | POA: Diagnosis not present

## 2022-08-09 DIAGNOSIS — R8290 Unspecified abnormal findings in urine: Secondary | ICD-10-CM | POA: Diagnosis not present

## 2022-08-10 DIAGNOSIS — D631 Anemia in chronic kidney disease: Secondary | ICD-10-CM | POA: Diagnosis not present

## 2022-08-10 DIAGNOSIS — K769 Liver disease, unspecified: Secondary | ICD-10-CM | POA: Diagnosis not present

## 2022-08-10 DIAGNOSIS — Z992 Dependence on renal dialysis: Secondary | ICD-10-CM | POA: Diagnosis not present

## 2022-08-10 DIAGNOSIS — Z4932 Encounter for adequacy testing for peritoneal dialysis: Secondary | ICD-10-CM | POA: Diagnosis not present

## 2022-08-10 DIAGNOSIS — N186 End stage renal disease: Secondary | ICD-10-CM | POA: Diagnosis not present

## 2022-08-10 DIAGNOSIS — N2581 Secondary hyperparathyroidism of renal origin: Secondary | ICD-10-CM | POA: Diagnosis not present

## 2022-08-10 DIAGNOSIS — R8290 Unspecified abnormal findings in urine: Secondary | ICD-10-CM | POA: Diagnosis not present

## 2022-08-11 DIAGNOSIS — R8290 Unspecified abnormal findings in urine: Secondary | ICD-10-CM | POA: Diagnosis not present

## 2022-08-11 DIAGNOSIS — I129 Hypertensive chronic kidney disease with stage 1 through stage 4 chronic kidney disease, or unspecified chronic kidney disease: Secondary | ICD-10-CM | POA: Diagnosis not present

## 2022-08-11 DIAGNOSIS — Z992 Dependence on renal dialysis: Secondary | ICD-10-CM | POA: Diagnosis not present

## 2022-08-11 DIAGNOSIS — N2581 Secondary hyperparathyroidism of renal origin: Secondary | ICD-10-CM | POA: Diagnosis not present

## 2022-08-11 DIAGNOSIS — K769 Liver disease, unspecified: Secondary | ICD-10-CM | POA: Diagnosis not present

## 2022-08-11 DIAGNOSIS — Z23 Encounter for immunization: Secondary | ICD-10-CM | POA: Diagnosis not present

## 2022-08-11 DIAGNOSIS — N186 End stage renal disease: Secondary | ICD-10-CM | POA: Diagnosis not present

## 2022-08-11 DIAGNOSIS — Z4932 Encounter for adequacy testing for peritoneal dialysis: Secondary | ICD-10-CM | POA: Diagnosis not present

## 2022-08-11 DIAGNOSIS — D631 Anemia in chronic kidney disease: Secondary | ICD-10-CM | POA: Diagnosis not present

## 2022-08-12 DIAGNOSIS — K769 Liver disease, unspecified: Secondary | ICD-10-CM | POA: Diagnosis not present

## 2022-08-12 DIAGNOSIS — N2581 Secondary hyperparathyroidism of renal origin: Secondary | ICD-10-CM | POA: Diagnosis not present

## 2022-08-12 DIAGNOSIS — D631 Anemia in chronic kidney disease: Secondary | ICD-10-CM | POA: Diagnosis not present

## 2022-08-12 DIAGNOSIS — Z4932 Encounter for adequacy testing for peritoneal dialysis: Secondary | ICD-10-CM | POA: Diagnosis not present

## 2022-08-12 DIAGNOSIS — N186 End stage renal disease: Secondary | ICD-10-CM | POA: Diagnosis not present

## 2022-08-12 DIAGNOSIS — Z992 Dependence on renal dialysis: Secondary | ICD-10-CM | POA: Diagnosis not present

## 2022-08-12 DIAGNOSIS — R8290 Unspecified abnormal findings in urine: Secondary | ICD-10-CM | POA: Diagnosis not present

## 2022-08-13 DIAGNOSIS — R8290 Unspecified abnormal findings in urine: Secondary | ICD-10-CM | POA: Diagnosis not present

## 2022-08-13 DIAGNOSIS — N186 End stage renal disease: Secondary | ICD-10-CM | POA: Diagnosis not present

## 2022-08-13 DIAGNOSIS — Z4932 Encounter for adequacy testing for peritoneal dialysis: Secondary | ICD-10-CM | POA: Diagnosis not present

## 2022-08-13 DIAGNOSIS — Z992 Dependence on renal dialysis: Secondary | ICD-10-CM | POA: Diagnosis not present

## 2022-08-13 DIAGNOSIS — N2581 Secondary hyperparathyroidism of renal origin: Secondary | ICD-10-CM | POA: Diagnosis not present

## 2022-08-13 DIAGNOSIS — D631 Anemia in chronic kidney disease: Secondary | ICD-10-CM | POA: Diagnosis not present

## 2022-08-13 DIAGNOSIS — K769 Liver disease, unspecified: Secondary | ICD-10-CM | POA: Diagnosis not present

## 2022-08-14 DIAGNOSIS — R8290 Unspecified abnormal findings in urine: Secondary | ICD-10-CM | POA: Diagnosis not present

## 2022-08-14 DIAGNOSIS — Z992 Dependence on renal dialysis: Secondary | ICD-10-CM | POA: Diagnosis not present

## 2022-08-14 DIAGNOSIS — K769 Liver disease, unspecified: Secondary | ICD-10-CM | POA: Diagnosis not present

## 2022-08-14 DIAGNOSIS — Z4932 Encounter for adequacy testing for peritoneal dialysis: Secondary | ICD-10-CM | POA: Diagnosis not present

## 2022-08-14 DIAGNOSIS — N2581 Secondary hyperparathyroidism of renal origin: Secondary | ICD-10-CM | POA: Diagnosis not present

## 2022-08-14 DIAGNOSIS — N186 End stage renal disease: Secondary | ICD-10-CM | POA: Diagnosis not present

## 2022-08-14 DIAGNOSIS — D631 Anemia in chronic kidney disease: Secondary | ICD-10-CM | POA: Diagnosis not present

## 2022-08-15 DIAGNOSIS — N2581 Secondary hyperparathyroidism of renal origin: Secondary | ICD-10-CM | POA: Diagnosis not present

## 2022-08-15 DIAGNOSIS — K769 Liver disease, unspecified: Secondary | ICD-10-CM | POA: Diagnosis not present

## 2022-08-15 DIAGNOSIS — R8290 Unspecified abnormal findings in urine: Secondary | ICD-10-CM | POA: Diagnosis not present

## 2022-08-15 DIAGNOSIS — Z4932 Encounter for adequacy testing for peritoneal dialysis: Secondary | ICD-10-CM | POA: Diagnosis not present

## 2022-08-15 DIAGNOSIS — D631 Anemia in chronic kidney disease: Secondary | ICD-10-CM | POA: Diagnosis not present

## 2022-08-15 DIAGNOSIS — Z992 Dependence on renal dialysis: Secondary | ICD-10-CM | POA: Diagnosis not present

## 2022-08-15 DIAGNOSIS — N186 End stage renal disease: Secondary | ICD-10-CM | POA: Diagnosis not present

## 2022-08-16 DIAGNOSIS — D631 Anemia in chronic kidney disease: Secondary | ICD-10-CM | POA: Diagnosis not present

## 2022-08-16 DIAGNOSIS — N186 End stage renal disease: Secondary | ICD-10-CM | POA: Diagnosis not present

## 2022-08-16 DIAGNOSIS — R8290 Unspecified abnormal findings in urine: Secondary | ICD-10-CM | POA: Diagnosis not present

## 2022-08-16 DIAGNOSIS — N2581 Secondary hyperparathyroidism of renal origin: Secondary | ICD-10-CM | POA: Diagnosis not present

## 2022-08-16 DIAGNOSIS — K769 Liver disease, unspecified: Secondary | ICD-10-CM | POA: Diagnosis not present

## 2022-08-16 DIAGNOSIS — Z992 Dependence on renal dialysis: Secondary | ICD-10-CM | POA: Diagnosis not present

## 2022-08-16 DIAGNOSIS — Z4932 Encounter for adequacy testing for peritoneal dialysis: Secondary | ICD-10-CM | POA: Diagnosis not present

## 2022-08-17 DIAGNOSIS — Z4932 Encounter for adequacy testing for peritoneal dialysis: Secondary | ICD-10-CM | POA: Diagnosis not present

## 2022-08-17 DIAGNOSIS — K769 Liver disease, unspecified: Secondary | ICD-10-CM | POA: Diagnosis not present

## 2022-08-17 DIAGNOSIS — R8290 Unspecified abnormal findings in urine: Secondary | ICD-10-CM | POA: Diagnosis not present

## 2022-08-17 DIAGNOSIS — D631 Anemia in chronic kidney disease: Secondary | ICD-10-CM | POA: Diagnosis not present

## 2022-08-17 DIAGNOSIS — N2581 Secondary hyperparathyroidism of renal origin: Secondary | ICD-10-CM | POA: Diagnosis not present

## 2022-08-17 DIAGNOSIS — Z992 Dependence on renal dialysis: Secondary | ICD-10-CM | POA: Diagnosis not present

## 2022-08-17 DIAGNOSIS — N186 End stage renal disease: Secondary | ICD-10-CM | POA: Diagnosis not present

## 2022-08-18 DIAGNOSIS — N2581 Secondary hyperparathyroidism of renal origin: Secondary | ICD-10-CM | POA: Diagnosis not present

## 2022-08-18 DIAGNOSIS — Z992 Dependence on renal dialysis: Secondary | ICD-10-CM | POA: Diagnosis not present

## 2022-08-18 DIAGNOSIS — K769 Liver disease, unspecified: Secondary | ICD-10-CM | POA: Diagnosis not present

## 2022-08-18 DIAGNOSIS — R8290 Unspecified abnormal findings in urine: Secondary | ICD-10-CM | POA: Diagnosis not present

## 2022-08-18 DIAGNOSIS — N186 End stage renal disease: Secondary | ICD-10-CM | POA: Diagnosis not present

## 2022-08-18 DIAGNOSIS — Z4932 Encounter for adequacy testing for peritoneal dialysis: Secondary | ICD-10-CM | POA: Diagnosis not present

## 2022-08-18 DIAGNOSIS — D631 Anemia in chronic kidney disease: Secondary | ICD-10-CM | POA: Diagnosis not present

## 2022-08-19 DIAGNOSIS — R8290 Unspecified abnormal findings in urine: Secondary | ICD-10-CM | POA: Diagnosis not present

## 2022-08-19 DIAGNOSIS — N186 End stage renal disease: Secondary | ICD-10-CM | POA: Diagnosis not present

## 2022-08-19 DIAGNOSIS — N2581 Secondary hyperparathyroidism of renal origin: Secondary | ICD-10-CM | POA: Diagnosis not present

## 2022-08-19 DIAGNOSIS — D631 Anemia in chronic kidney disease: Secondary | ICD-10-CM | POA: Diagnosis not present

## 2022-08-19 DIAGNOSIS — K769 Liver disease, unspecified: Secondary | ICD-10-CM | POA: Diagnosis not present

## 2022-08-19 DIAGNOSIS — Z992 Dependence on renal dialysis: Secondary | ICD-10-CM | POA: Diagnosis not present

## 2022-08-19 DIAGNOSIS — Z4932 Encounter for adequacy testing for peritoneal dialysis: Secondary | ICD-10-CM | POA: Diagnosis not present

## 2022-08-20 DIAGNOSIS — K769 Liver disease, unspecified: Secondary | ICD-10-CM | POA: Diagnosis not present

## 2022-08-20 DIAGNOSIS — D631 Anemia in chronic kidney disease: Secondary | ICD-10-CM | POA: Diagnosis not present

## 2022-08-20 DIAGNOSIS — Z992 Dependence on renal dialysis: Secondary | ICD-10-CM | POA: Diagnosis not present

## 2022-08-20 DIAGNOSIS — N2581 Secondary hyperparathyroidism of renal origin: Secondary | ICD-10-CM | POA: Diagnosis not present

## 2022-08-20 DIAGNOSIS — N186 End stage renal disease: Secondary | ICD-10-CM | POA: Diagnosis not present

## 2022-08-20 DIAGNOSIS — R8290 Unspecified abnormal findings in urine: Secondary | ICD-10-CM | POA: Diagnosis not present

## 2022-08-20 DIAGNOSIS — Z4932 Encounter for adequacy testing for peritoneal dialysis: Secondary | ICD-10-CM | POA: Diagnosis not present

## 2022-08-21 DIAGNOSIS — N2581 Secondary hyperparathyroidism of renal origin: Secondary | ICD-10-CM | POA: Diagnosis not present

## 2022-08-21 DIAGNOSIS — N186 End stage renal disease: Secondary | ICD-10-CM | POA: Diagnosis not present

## 2022-08-21 DIAGNOSIS — D631 Anemia in chronic kidney disease: Secondary | ICD-10-CM | POA: Diagnosis not present

## 2022-08-21 DIAGNOSIS — R8290 Unspecified abnormal findings in urine: Secondary | ICD-10-CM | POA: Diagnosis not present

## 2022-08-21 DIAGNOSIS — Z4932 Encounter for adequacy testing for peritoneal dialysis: Secondary | ICD-10-CM | POA: Diagnosis not present

## 2022-08-21 DIAGNOSIS — K769 Liver disease, unspecified: Secondary | ICD-10-CM | POA: Diagnosis not present

## 2022-08-21 DIAGNOSIS — Z992 Dependence on renal dialysis: Secondary | ICD-10-CM | POA: Diagnosis not present

## 2022-08-22 DIAGNOSIS — Z992 Dependence on renal dialysis: Secondary | ICD-10-CM | POA: Diagnosis not present

## 2022-08-22 DIAGNOSIS — R8290 Unspecified abnormal findings in urine: Secondary | ICD-10-CM | POA: Diagnosis not present

## 2022-08-22 DIAGNOSIS — N186 End stage renal disease: Secondary | ICD-10-CM | POA: Diagnosis not present

## 2022-08-22 DIAGNOSIS — Z4932 Encounter for adequacy testing for peritoneal dialysis: Secondary | ICD-10-CM | POA: Diagnosis not present

## 2022-08-22 DIAGNOSIS — N2581 Secondary hyperparathyroidism of renal origin: Secondary | ICD-10-CM | POA: Diagnosis not present

## 2022-08-22 DIAGNOSIS — D631 Anemia in chronic kidney disease: Secondary | ICD-10-CM | POA: Diagnosis not present

## 2022-08-22 DIAGNOSIS — K769 Liver disease, unspecified: Secondary | ICD-10-CM | POA: Diagnosis not present

## 2022-08-23 DIAGNOSIS — N2581 Secondary hyperparathyroidism of renal origin: Secondary | ICD-10-CM | POA: Diagnosis not present

## 2022-08-23 DIAGNOSIS — D631 Anemia in chronic kidney disease: Secondary | ICD-10-CM | POA: Diagnosis not present

## 2022-08-23 DIAGNOSIS — Z4932 Encounter for adequacy testing for peritoneal dialysis: Secondary | ICD-10-CM | POA: Diagnosis not present

## 2022-08-23 DIAGNOSIS — Z992 Dependence on renal dialysis: Secondary | ICD-10-CM | POA: Diagnosis not present

## 2022-08-23 DIAGNOSIS — K769 Liver disease, unspecified: Secondary | ICD-10-CM | POA: Diagnosis not present

## 2022-08-23 DIAGNOSIS — N186 End stage renal disease: Secondary | ICD-10-CM | POA: Diagnosis not present

## 2022-08-23 DIAGNOSIS — R8290 Unspecified abnormal findings in urine: Secondary | ICD-10-CM | POA: Diagnosis not present

## 2022-08-24 DIAGNOSIS — N2581 Secondary hyperparathyroidism of renal origin: Secondary | ICD-10-CM | POA: Diagnosis not present

## 2022-08-24 DIAGNOSIS — D631 Anemia in chronic kidney disease: Secondary | ICD-10-CM | POA: Diagnosis not present

## 2022-08-24 DIAGNOSIS — R8290 Unspecified abnormal findings in urine: Secondary | ICD-10-CM | POA: Diagnosis not present

## 2022-08-24 DIAGNOSIS — K769 Liver disease, unspecified: Secondary | ICD-10-CM | POA: Diagnosis not present

## 2022-08-24 DIAGNOSIS — N186 End stage renal disease: Secondary | ICD-10-CM | POA: Diagnosis not present

## 2022-08-24 DIAGNOSIS — Z992 Dependence on renal dialysis: Secondary | ICD-10-CM | POA: Diagnosis not present

## 2022-08-24 DIAGNOSIS — Z4932 Encounter for adequacy testing for peritoneal dialysis: Secondary | ICD-10-CM | POA: Diagnosis not present

## 2022-08-25 DIAGNOSIS — K769 Liver disease, unspecified: Secondary | ICD-10-CM | POA: Diagnosis not present

## 2022-08-25 DIAGNOSIS — N2581 Secondary hyperparathyroidism of renal origin: Secondary | ICD-10-CM | POA: Diagnosis not present

## 2022-08-25 DIAGNOSIS — D631 Anemia in chronic kidney disease: Secondary | ICD-10-CM | POA: Diagnosis not present

## 2022-08-25 DIAGNOSIS — N186 End stage renal disease: Secondary | ICD-10-CM | POA: Diagnosis not present

## 2022-08-25 DIAGNOSIS — Z4932 Encounter for adequacy testing for peritoneal dialysis: Secondary | ICD-10-CM | POA: Diagnosis not present

## 2022-08-25 DIAGNOSIS — Z992 Dependence on renal dialysis: Secondary | ICD-10-CM | POA: Diagnosis not present

## 2022-08-25 DIAGNOSIS — R8290 Unspecified abnormal findings in urine: Secondary | ICD-10-CM | POA: Diagnosis not present

## 2022-08-26 DIAGNOSIS — D631 Anemia in chronic kidney disease: Secondary | ICD-10-CM | POA: Diagnosis not present

## 2022-08-26 DIAGNOSIS — Z992 Dependence on renal dialysis: Secondary | ICD-10-CM | POA: Diagnosis not present

## 2022-08-26 DIAGNOSIS — Z4932 Encounter for adequacy testing for peritoneal dialysis: Secondary | ICD-10-CM | POA: Diagnosis not present

## 2022-08-26 DIAGNOSIS — R8290 Unspecified abnormal findings in urine: Secondary | ICD-10-CM | POA: Diagnosis not present

## 2022-08-26 DIAGNOSIS — K769 Liver disease, unspecified: Secondary | ICD-10-CM | POA: Diagnosis not present

## 2022-08-26 DIAGNOSIS — N2581 Secondary hyperparathyroidism of renal origin: Secondary | ICD-10-CM | POA: Diagnosis not present

## 2022-08-26 DIAGNOSIS — N186 End stage renal disease: Secondary | ICD-10-CM | POA: Diagnosis not present

## 2022-08-27 DIAGNOSIS — N2581 Secondary hyperparathyroidism of renal origin: Secondary | ICD-10-CM | POA: Diagnosis not present

## 2022-08-27 DIAGNOSIS — D631 Anemia in chronic kidney disease: Secondary | ICD-10-CM | POA: Diagnosis not present

## 2022-08-27 DIAGNOSIS — K769 Liver disease, unspecified: Secondary | ICD-10-CM | POA: Diagnosis not present

## 2022-08-27 DIAGNOSIS — R8290 Unspecified abnormal findings in urine: Secondary | ICD-10-CM | POA: Diagnosis not present

## 2022-08-27 DIAGNOSIS — Z4932 Encounter for adequacy testing for peritoneal dialysis: Secondary | ICD-10-CM | POA: Diagnosis not present

## 2022-08-27 DIAGNOSIS — N186 End stage renal disease: Secondary | ICD-10-CM | POA: Diagnosis not present

## 2022-08-27 DIAGNOSIS — Z992 Dependence on renal dialysis: Secondary | ICD-10-CM | POA: Diagnosis not present

## 2022-08-28 DIAGNOSIS — R8290 Unspecified abnormal findings in urine: Secondary | ICD-10-CM | POA: Diagnosis not present

## 2022-08-28 DIAGNOSIS — Z992 Dependence on renal dialysis: Secondary | ICD-10-CM | POA: Diagnosis not present

## 2022-08-28 DIAGNOSIS — D631 Anemia in chronic kidney disease: Secondary | ICD-10-CM | POA: Diagnosis not present

## 2022-08-28 DIAGNOSIS — N2581 Secondary hyperparathyroidism of renal origin: Secondary | ICD-10-CM | POA: Diagnosis not present

## 2022-08-28 DIAGNOSIS — K769 Liver disease, unspecified: Secondary | ICD-10-CM | POA: Diagnosis not present

## 2022-08-28 DIAGNOSIS — N186 End stage renal disease: Secondary | ICD-10-CM | POA: Diagnosis not present

## 2022-08-28 DIAGNOSIS — Z4932 Encounter for adequacy testing for peritoneal dialysis: Secondary | ICD-10-CM | POA: Diagnosis not present

## 2022-08-29 DIAGNOSIS — N2581 Secondary hyperparathyroidism of renal origin: Secondary | ICD-10-CM | POA: Diagnosis not present

## 2022-08-29 DIAGNOSIS — N186 End stage renal disease: Secondary | ICD-10-CM | POA: Diagnosis not present

## 2022-08-29 DIAGNOSIS — K769 Liver disease, unspecified: Secondary | ICD-10-CM | POA: Diagnosis not present

## 2022-08-29 DIAGNOSIS — Z992 Dependence on renal dialysis: Secondary | ICD-10-CM | POA: Diagnosis not present

## 2022-08-29 DIAGNOSIS — D631 Anemia in chronic kidney disease: Secondary | ICD-10-CM | POA: Diagnosis not present

## 2022-08-29 DIAGNOSIS — R8290 Unspecified abnormal findings in urine: Secondary | ICD-10-CM | POA: Diagnosis not present

## 2022-08-29 DIAGNOSIS — Z4932 Encounter for adequacy testing for peritoneal dialysis: Secondary | ICD-10-CM | POA: Diagnosis not present

## 2022-08-30 DIAGNOSIS — K769 Liver disease, unspecified: Secondary | ICD-10-CM | POA: Diagnosis not present

## 2022-08-30 DIAGNOSIS — Z992 Dependence on renal dialysis: Secondary | ICD-10-CM | POA: Diagnosis not present

## 2022-08-30 DIAGNOSIS — N186 End stage renal disease: Secondary | ICD-10-CM | POA: Diagnosis not present

## 2022-08-30 DIAGNOSIS — N2581 Secondary hyperparathyroidism of renal origin: Secondary | ICD-10-CM | POA: Diagnosis not present

## 2022-08-30 DIAGNOSIS — Z4932 Encounter for adequacy testing for peritoneal dialysis: Secondary | ICD-10-CM | POA: Diagnosis not present

## 2022-08-30 DIAGNOSIS — D631 Anemia in chronic kidney disease: Secondary | ICD-10-CM | POA: Diagnosis not present

## 2022-08-30 DIAGNOSIS — R8290 Unspecified abnormal findings in urine: Secondary | ICD-10-CM | POA: Diagnosis not present

## 2022-08-31 DIAGNOSIS — K769 Liver disease, unspecified: Secondary | ICD-10-CM | POA: Diagnosis not present

## 2022-08-31 DIAGNOSIS — R8290 Unspecified abnormal findings in urine: Secondary | ICD-10-CM | POA: Diagnosis not present

## 2022-08-31 DIAGNOSIS — Z4932 Encounter for adequacy testing for peritoneal dialysis: Secondary | ICD-10-CM | POA: Diagnosis not present

## 2022-08-31 DIAGNOSIS — D631 Anemia in chronic kidney disease: Secondary | ICD-10-CM | POA: Diagnosis not present

## 2022-08-31 DIAGNOSIS — Z992 Dependence on renal dialysis: Secondary | ICD-10-CM | POA: Diagnosis not present

## 2022-08-31 DIAGNOSIS — N186 End stage renal disease: Secondary | ICD-10-CM | POA: Diagnosis not present

## 2022-08-31 DIAGNOSIS — N2581 Secondary hyperparathyroidism of renal origin: Secondary | ICD-10-CM | POA: Diagnosis not present

## 2022-09-01 DIAGNOSIS — N186 End stage renal disease: Secondary | ICD-10-CM | POA: Diagnosis not present

## 2022-09-01 DIAGNOSIS — N2581 Secondary hyperparathyroidism of renal origin: Secondary | ICD-10-CM | POA: Diagnosis not present

## 2022-09-01 DIAGNOSIS — Z4932 Encounter for adequacy testing for peritoneal dialysis: Secondary | ICD-10-CM | POA: Diagnosis not present

## 2022-09-01 DIAGNOSIS — K769 Liver disease, unspecified: Secondary | ICD-10-CM | POA: Diagnosis not present

## 2022-09-01 DIAGNOSIS — D631 Anemia in chronic kidney disease: Secondary | ICD-10-CM | POA: Diagnosis not present

## 2022-09-01 DIAGNOSIS — R8290 Unspecified abnormal findings in urine: Secondary | ICD-10-CM | POA: Diagnosis not present

## 2022-09-01 DIAGNOSIS — Z992 Dependence on renal dialysis: Secondary | ICD-10-CM | POA: Diagnosis not present

## 2022-09-02 DIAGNOSIS — Z4932 Encounter for adequacy testing for peritoneal dialysis: Secondary | ICD-10-CM | POA: Diagnosis not present

## 2022-09-02 DIAGNOSIS — N186 End stage renal disease: Secondary | ICD-10-CM | POA: Diagnosis not present

## 2022-09-02 DIAGNOSIS — Z992 Dependence on renal dialysis: Secondary | ICD-10-CM | POA: Diagnosis not present

## 2022-09-02 DIAGNOSIS — D631 Anemia in chronic kidney disease: Secondary | ICD-10-CM | POA: Diagnosis not present

## 2022-09-02 DIAGNOSIS — N2581 Secondary hyperparathyroidism of renal origin: Secondary | ICD-10-CM | POA: Diagnosis not present

## 2022-09-02 DIAGNOSIS — R8290 Unspecified abnormal findings in urine: Secondary | ICD-10-CM | POA: Diagnosis not present

## 2022-09-02 DIAGNOSIS — K769 Liver disease, unspecified: Secondary | ICD-10-CM | POA: Diagnosis not present

## 2022-09-03 DIAGNOSIS — R8290 Unspecified abnormal findings in urine: Secondary | ICD-10-CM | POA: Diagnosis not present

## 2022-09-03 DIAGNOSIS — Z4932 Encounter for adequacy testing for peritoneal dialysis: Secondary | ICD-10-CM | POA: Diagnosis not present

## 2022-09-03 DIAGNOSIS — D631 Anemia in chronic kidney disease: Secondary | ICD-10-CM | POA: Diagnosis not present

## 2022-09-03 DIAGNOSIS — N186 End stage renal disease: Secondary | ICD-10-CM | POA: Diagnosis not present

## 2022-09-03 DIAGNOSIS — N2581 Secondary hyperparathyroidism of renal origin: Secondary | ICD-10-CM | POA: Diagnosis not present

## 2022-09-03 DIAGNOSIS — Z992 Dependence on renal dialysis: Secondary | ICD-10-CM | POA: Diagnosis not present

## 2022-09-03 DIAGNOSIS — K769 Liver disease, unspecified: Secondary | ICD-10-CM | POA: Diagnosis not present

## 2022-09-04 DIAGNOSIS — N186 End stage renal disease: Secondary | ICD-10-CM | POA: Diagnosis not present

## 2022-09-04 DIAGNOSIS — N2581 Secondary hyperparathyroidism of renal origin: Secondary | ICD-10-CM | POA: Diagnosis not present

## 2022-09-04 DIAGNOSIS — K769 Liver disease, unspecified: Secondary | ICD-10-CM | POA: Diagnosis not present

## 2022-09-04 DIAGNOSIS — Z992 Dependence on renal dialysis: Secondary | ICD-10-CM | POA: Diagnosis not present

## 2022-09-04 DIAGNOSIS — Z4932 Encounter for adequacy testing for peritoneal dialysis: Secondary | ICD-10-CM | POA: Diagnosis not present

## 2022-09-04 DIAGNOSIS — D631 Anemia in chronic kidney disease: Secondary | ICD-10-CM | POA: Diagnosis not present

## 2022-09-04 DIAGNOSIS — R8290 Unspecified abnormal findings in urine: Secondary | ICD-10-CM | POA: Diagnosis not present

## 2022-09-05 DIAGNOSIS — K769 Liver disease, unspecified: Secondary | ICD-10-CM | POA: Diagnosis not present

## 2022-09-05 DIAGNOSIS — N186 End stage renal disease: Secondary | ICD-10-CM | POA: Diagnosis not present

## 2022-09-05 DIAGNOSIS — Z992 Dependence on renal dialysis: Secondary | ICD-10-CM | POA: Diagnosis not present

## 2022-09-05 DIAGNOSIS — D631 Anemia in chronic kidney disease: Secondary | ICD-10-CM | POA: Diagnosis not present

## 2022-09-05 DIAGNOSIS — Z4932 Encounter for adequacy testing for peritoneal dialysis: Secondary | ICD-10-CM | POA: Diagnosis not present

## 2022-09-05 DIAGNOSIS — R8290 Unspecified abnormal findings in urine: Secondary | ICD-10-CM | POA: Diagnosis not present

## 2022-09-05 DIAGNOSIS — N2581 Secondary hyperparathyroidism of renal origin: Secondary | ICD-10-CM | POA: Diagnosis not present

## 2022-09-06 DIAGNOSIS — D631 Anemia in chronic kidney disease: Secondary | ICD-10-CM | POA: Diagnosis not present

## 2022-09-06 DIAGNOSIS — Z992 Dependence on renal dialysis: Secondary | ICD-10-CM | POA: Diagnosis not present

## 2022-09-06 DIAGNOSIS — Z4932 Encounter for adequacy testing for peritoneal dialysis: Secondary | ICD-10-CM | POA: Diagnosis not present

## 2022-09-06 DIAGNOSIS — N2581 Secondary hyperparathyroidism of renal origin: Secondary | ICD-10-CM | POA: Diagnosis not present

## 2022-09-06 DIAGNOSIS — N186 End stage renal disease: Secondary | ICD-10-CM | POA: Diagnosis not present

## 2022-09-06 DIAGNOSIS — R8290 Unspecified abnormal findings in urine: Secondary | ICD-10-CM | POA: Diagnosis not present

## 2022-09-06 DIAGNOSIS — K769 Liver disease, unspecified: Secondary | ICD-10-CM | POA: Diagnosis not present

## 2022-09-07 DIAGNOSIS — N186 End stage renal disease: Secondary | ICD-10-CM | POA: Diagnosis not present

## 2022-09-07 DIAGNOSIS — N2581 Secondary hyperparathyroidism of renal origin: Secondary | ICD-10-CM | POA: Diagnosis not present

## 2022-09-07 DIAGNOSIS — R8290 Unspecified abnormal findings in urine: Secondary | ICD-10-CM | POA: Diagnosis not present

## 2022-09-07 DIAGNOSIS — K769 Liver disease, unspecified: Secondary | ICD-10-CM | POA: Diagnosis not present

## 2022-09-07 DIAGNOSIS — Z4932 Encounter for adequacy testing for peritoneal dialysis: Secondary | ICD-10-CM | POA: Diagnosis not present

## 2022-09-07 DIAGNOSIS — Z992 Dependence on renal dialysis: Secondary | ICD-10-CM | POA: Diagnosis not present

## 2022-09-07 DIAGNOSIS — D631 Anemia in chronic kidney disease: Secondary | ICD-10-CM | POA: Diagnosis not present

## 2022-09-08 DIAGNOSIS — R8290 Unspecified abnormal findings in urine: Secondary | ICD-10-CM | POA: Diagnosis not present

## 2022-09-08 DIAGNOSIS — Z992 Dependence on renal dialysis: Secondary | ICD-10-CM | POA: Diagnosis not present

## 2022-09-08 DIAGNOSIS — K769 Liver disease, unspecified: Secondary | ICD-10-CM | POA: Diagnosis not present

## 2022-09-08 DIAGNOSIS — N2581 Secondary hyperparathyroidism of renal origin: Secondary | ICD-10-CM | POA: Diagnosis not present

## 2022-09-08 DIAGNOSIS — D631 Anemia in chronic kidney disease: Secondary | ICD-10-CM | POA: Diagnosis not present

## 2022-09-08 DIAGNOSIS — N186 End stage renal disease: Secondary | ICD-10-CM | POA: Diagnosis not present

## 2022-09-08 DIAGNOSIS — Z4932 Encounter for adequacy testing for peritoneal dialysis: Secondary | ICD-10-CM | POA: Diagnosis not present

## 2022-09-09 DIAGNOSIS — Z4932 Encounter for adequacy testing for peritoneal dialysis: Secondary | ICD-10-CM | POA: Diagnosis not present

## 2022-09-09 DIAGNOSIS — N2581 Secondary hyperparathyroidism of renal origin: Secondary | ICD-10-CM | POA: Diagnosis not present

## 2022-09-09 DIAGNOSIS — Z992 Dependence on renal dialysis: Secondary | ICD-10-CM | POA: Diagnosis not present

## 2022-09-09 DIAGNOSIS — D631 Anemia in chronic kidney disease: Secondary | ICD-10-CM | POA: Diagnosis not present

## 2022-09-09 DIAGNOSIS — R8290 Unspecified abnormal findings in urine: Secondary | ICD-10-CM | POA: Diagnosis not present

## 2022-09-09 DIAGNOSIS — N186 End stage renal disease: Secondary | ICD-10-CM | POA: Diagnosis not present

## 2022-09-09 DIAGNOSIS — K769 Liver disease, unspecified: Secondary | ICD-10-CM | POA: Diagnosis not present

## 2022-09-10 DIAGNOSIS — R8290 Unspecified abnormal findings in urine: Secondary | ICD-10-CM | POA: Diagnosis not present

## 2022-09-10 DIAGNOSIS — Z4932 Encounter for adequacy testing for peritoneal dialysis: Secondary | ICD-10-CM | POA: Diagnosis not present

## 2022-09-10 DIAGNOSIS — N2581 Secondary hyperparathyroidism of renal origin: Secondary | ICD-10-CM | POA: Diagnosis not present

## 2022-09-10 DIAGNOSIS — D631 Anemia in chronic kidney disease: Secondary | ICD-10-CM | POA: Diagnosis not present

## 2022-09-10 DIAGNOSIS — Z992 Dependence on renal dialysis: Secondary | ICD-10-CM | POA: Diagnosis not present

## 2022-09-10 DIAGNOSIS — N186 End stage renal disease: Secondary | ICD-10-CM | POA: Diagnosis not present

## 2022-09-10 DIAGNOSIS — K769 Liver disease, unspecified: Secondary | ICD-10-CM | POA: Diagnosis not present

## 2022-09-11 DIAGNOSIS — Z4932 Encounter for adequacy testing for peritoneal dialysis: Secondary | ICD-10-CM | POA: Diagnosis not present

## 2022-09-11 DIAGNOSIS — N2581 Secondary hyperparathyroidism of renal origin: Secondary | ICD-10-CM | POA: Diagnosis not present

## 2022-09-11 DIAGNOSIS — D631 Anemia in chronic kidney disease: Secondary | ICD-10-CM | POA: Diagnosis not present

## 2022-09-11 DIAGNOSIS — K769 Liver disease, unspecified: Secondary | ICD-10-CM | POA: Diagnosis not present

## 2022-09-11 DIAGNOSIS — R8290 Unspecified abnormal findings in urine: Secondary | ICD-10-CM | POA: Diagnosis not present

## 2022-09-11 DIAGNOSIS — Z992 Dependence on renal dialysis: Secondary | ICD-10-CM | POA: Diagnosis not present

## 2022-09-11 DIAGNOSIS — I129 Hypertensive chronic kidney disease with stage 1 through stage 4 chronic kidney disease, or unspecified chronic kidney disease: Secondary | ICD-10-CM | POA: Diagnosis not present

## 2022-09-11 DIAGNOSIS — N186 End stage renal disease: Secondary | ICD-10-CM | POA: Diagnosis not present

## 2022-09-12 DIAGNOSIS — K769 Liver disease, unspecified: Secondary | ICD-10-CM | POA: Diagnosis not present

## 2022-09-12 DIAGNOSIS — Z992 Dependence on renal dialysis: Secondary | ICD-10-CM | POA: Diagnosis not present

## 2022-09-12 DIAGNOSIS — N2581 Secondary hyperparathyroidism of renal origin: Secondary | ICD-10-CM | POA: Diagnosis not present

## 2022-09-12 DIAGNOSIS — D631 Anemia in chronic kidney disease: Secondary | ICD-10-CM | POA: Diagnosis not present

## 2022-09-12 DIAGNOSIS — Z4932 Encounter for adequacy testing for peritoneal dialysis: Secondary | ICD-10-CM | POA: Diagnosis not present

## 2022-09-12 DIAGNOSIS — N186 End stage renal disease: Secondary | ICD-10-CM | POA: Diagnosis not present

## 2022-09-13 DIAGNOSIS — Z992 Dependence on renal dialysis: Secondary | ICD-10-CM | POA: Diagnosis not present

## 2022-09-13 DIAGNOSIS — K769 Liver disease, unspecified: Secondary | ICD-10-CM | POA: Diagnosis not present

## 2022-09-13 DIAGNOSIS — D631 Anemia in chronic kidney disease: Secondary | ICD-10-CM | POA: Diagnosis not present

## 2022-09-13 DIAGNOSIS — Z4932 Encounter for adequacy testing for peritoneal dialysis: Secondary | ICD-10-CM | POA: Diagnosis not present

## 2022-09-13 DIAGNOSIS — N2581 Secondary hyperparathyroidism of renal origin: Secondary | ICD-10-CM | POA: Diagnosis not present

## 2022-09-13 DIAGNOSIS — N186 End stage renal disease: Secondary | ICD-10-CM | POA: Diagnosis not present

## 2022-09-14 DIAGNOSIS — N2581 Secondary hyperparathyroidism of renal origin: Secondary | ICD-10-CM | POA: Diagnosis not present

## 2022-09-14 DIAGNOSIS — N186 End stage renal disease: Secondary | ICD-10-CM | POA: Diagnosis not present

## 2022-09-14 DIAGNOSIS — Z992 Dependence on renal dialysis: Secondary | ICD-10-CM | POA: Diagnosis not present

## 2022-09-14 DIAGNOSIS — K769 Liver disease, unspecified: Secondary | ICD-10-CM | POA: Diagnosis not present

## 2022-09-14 DIAGNOSIS — D631 Anemia in chronic kidney disease: Secondary | ICD-10-CM | POA: Diagnosis not present

## 2022-09-14 DIAGNOSIS — Z4932 Encounter for adequacy testing for peritoneal dialysis: Secondary | ICD-10-CM | POA: Diagnosis not present

## 2022-09-15 DIAGNOSIS — D631 Anemia in chronic kidney disease: Secondary | ICD-10-CM | POA: Diagnosis not present

## 2022-09-15 DIAGNOSIS — N186 End stage renal disease: Secondary | ICD-10-CM | POA: Diagnosis not present

## 2022-09-15 DIAGNOSIS — Z4932 Encounter for adequacy testing for peritoneal dialysis: Secondary | ICD-10-CM | POA: Diagnosis not present

## 2022-09-15 DIAGNOSIS — Z992 Dependence on renal dialysis: Secondary | ICD-10-CM | POA: Diagnosis not present

## 2022-09-15 DIAGNOSIS — N2581 Secondary hyperparathyroidism of renal origin: Secondary | ICD-10-CM | POA: Diagnosis not present

## 2022-09-15 DIAGNOSIS — K769 Liver disease, unspecified: Secondary | ICD-10-CM | POA: Diagnosis not present

## 2022-09-16 DIAGNOSIS — D631 Anemia in chronic kidney disease: Secondary | ICD-10-CM | POA: Diagnosis not present

## 2022-09-16 DIAGNOSIS — K769 Liver disease, unspecified: Secondary | ICD-10-CM | POA: Diagnosis not present

## 2022-09-16 DIAGNOSIS — Z992 Dependence on renal dialysis: Secondary | ICD-10-CM | POA: Diagnosis not present

## 2022-09-16 DIAGNOSIS — N2581 Secondary hyperparathyroidism of renal origin: Secondary | ICD-10-CM | POA: Diagnosis not present

## 2022-09-16 DIAGNOSIS — Z4932 Encounter for adequacy testing for peritoneal dialysis: Secondary | ICD-10-CM | POA: Diagnosis not present

## 2022-09-16 DIAGNOSIS — N186 End stage renal disease: Secondary | ICD-10-CM | POA: Diagnosis not present

## 2022-09-17 DIAGNOSIS — N2581 Secondary hyperparathyroidism of renal origin: Secondary | ICD-10-CM | POA: Diagnosis not present

## 2022-09-17 DIAGNOSIS — N186 End stage renal disease: Secondary | ICD-10-CM | POA: Diagnosis not present

## 2022-09-17 DIAGNOSIS — Z992 Dependence on renal dialysis: Secondary | ICD-10-CM | POA: Diagnosis not present

## 2022-09-17 DIAGNOSIS — D631 Anemia in chronic kidney disease: Secondary | ICD-10-CM | POA: Diagnosis not present

## 2022-09-17 DIAGNOSIS — K769 Liver disease, unspecified: Secondary | ICD-10-CM | POA: Diagnosis not present

## 2022-09-17 DIAGNOSIS — Z4932 Encounter for adequacy testing for peritoneal dialysis: Secondary | ICD-10-CM | POA: Diagnosis not present

## 2022-09-18 DIAGNOSIS — Z992 Dependence on renal dialysis: Secondary | ICD-10-CM | POA: Diagnosis not present

## 2022-09-18 DIAGNOSIS — N2581 Secondary hyperparathyroidism of renal origin: Secondary | ICD-10-CM | POA: Diagnosis not present

## 2022-09-18 DIAGNOSIS — K769 Liver disease, unspecified: Secondary | ICD-10-CM | POA: Diagnosis not present

## 2022-09-18 DIAGNOSIS — Z4932 Encounter for adequacy testing for peritoneal dialysis: Secondary | ICD-10-CM | POA: Diagnosis not present

## 2022-09-18 DIAGNOSIS — N186 End stage renal disease: Secondary | ICD-10-CM | POA: Diagnosis not present

## 2022-09-18 DIAGNOSIS — D631 Anemia in chronic kidney disease: Secondary | ICD-10-CM | POA: Diagnosis not present

## 2022-09-19 DIAGNOSIS — N2581 Secondary hyperparathyroidism of renal origin: Secondary | ICD-10-CM | POA: Diagnosis not present

## 2022-09-19 DIAGNOSIS — Z4932 Encounter for adequacy testing for peritoneal dialysis: Secondary | ICD-10-CM | POA: Diagnosis not present

## 2022-09-19 DIAGNOSIS — K769 Liver disease, unspecified: Secondary | ICD-10-CM | POA: Diagnosis not present

## 2022-09-19 DIAGNOSIS — N186 End stage renal disease: Secondary | ICD-10-CM | POA: Diagnosis not present

## 2022-09-19 DIAGNOSIS — Z992 Dependence on renal dialysis: Secondary | ICD-10-CM | POA: Diagnosis not present

## 2022-09-19 DIAGNOSIS — D631 Anemia in chronic kidney disease: Secondary | ICD-10-CM | POA: Diagnosis not present

## 2022-09-20 DIAGNOSIS — K769 Liver disease, unspecified: Secondary | ICD-10-CM | POA: Diagnosis not present

## 2022-09-20 DIAGNOSIS — N2581 Secondary hyperparathyroidism of renal origin: Secondary | ICD-10-CM | POA: Diagnosis not present

## 2022-09-20 DIAGNOSIS — D631 Anemia in chronic kidney disease: Secondary | ICD-10-CM | POA: Diagnosis not present

## 2022-09-20 DIAGNOSIS — N186 End stage renal disease: Secondary | ICD-10-CM | POA: Diagnosis not present

## 2022-09-20 DIAGNOSIS — Z992 Dependence on renal dialysis: Secondary | ICD-10-CM | POA: Diagnosis not present

## 2022-09-20 DIAGNOSIS — Z4932 Encounter for adequacy testing for peritoneal dialysis: Secondary | ICD-10-CM | POA: Diagnosis not present

## 2022-09-21 DIAGNOSIS — Z4932 Encounter for adequacy testing for peritoneal dialysis: Secondary | ICD-10-CM | POA: Diagnosis not present

## 2022-09-21 DIAGNOSIS — K769 Liver disease, unspecified: Secondary | ICD-10-CM | POA: Diagnosis not present

## 2022-09-21 DIAGNOSIS — Z992 Dependence on renal dialysis: Secondary | ICD-10-CM | POA: Diagnosis not present

## 2022-09-21 DIAGNOSIS — D631 Anemia in chronic kidney disease: Secondary | ICD-10-CM | POA: Diagnosis not present

## 2022-09-21 DIAGNOSIS — N186 End stage renal disease: Secondary | ICD-10-CM | POA: Diagnosis not present

## 2022-09-21 DIAGNOSIS — N2581 Secondary hyperparathyroidism of renal origin: Secondary | ICD-10-CM | POA: Diagnosis not present

## 2022-09-22 DIAGNOSIS — N186 End stage renal disease: Secondary | ICD-10-CM | POA: Diagnosis not present

## 2022-09-22 DIAGNOSIS — D631 Anemia in chronic kidney disease: Secondary | ICD-10-CM | POA: Diagnosis not present

## 2022-09-22 DIAGNOSIS — N2581 Secondary hyperparathyroidism of renal origin: Secondary | ICD-10-CM | POA: Diagnosis not present

## 2022-09-22 DIAGNOSIS — K769 Liver disease, unspecified: Secondary | ICD-10-CM | POA: Diagnosis not present

## 2022-09-22 DIAGNOSIS — Z992 Dependence on renal dialysis: Secondary | ICD-10-CM | POA: Diagnosis not present

## 2022-09-22 DIAGNOSIS — Z4932 Encounter for adequacy testing for peritoneal dialysis: Secondary | ICD-10-CM | POA: Diagnosis not present

## 2022-09-23 DIAGNOSIS — N186 End stage renal disease: Secondary | ICD-10-CM | POA: Diagnosis not present

## 2022-09-23 DIAGNOSIS — N2581 Secondary hyperparathyroidism of renal origin: Secondary | ICD-10-CM | POA: Diagnosis not present

## 2022-09-23 DIAGNOSIS — Z992 Dependence on renal dialysis: Secondary | ICD-10-CM | POA: Diagnosis not present

## 2022-09-23 DIAGNOSIS — K769 Liver disease, unspecified: Secondary | ICD-10-CM | POA: Diagnosis not present

## 2022-09-23 DIAGNOSIS — Z4932 Encounter for adequacy testing for peritoneal dialysis: Secondary | ICD-10-CM | POA: Diagnosis not present

## 2022-09-23 DIAGNOSIS — D631 Anemia in chronic kidney disease: Secondary | ICD-10-CM | POA: Diagnosis not present

## 2022-09-24 DIAGNOSIS — K769 Liver disease, unspecified: Secondary | ICD-10-CM | POA: Diagnosis not present

## 2022-09-24 DIAGNOSIS — N2581 Secondary hyperparathyroidism of renal origin: Secondary | ICD-10-CM | POA: Diagnosis not present

## 2022-09-24 DIAGNOSIS — Z4932 Encounter for adequacy testing for peritoneal dialysis: Secondary | ICD-10-CM | POA: Diagnosis not present

## 2022-09-24 DIAGNOSIS — Z992 Dependence on renal dialysis: Secondary | ICD-10-CM | POA: Diagnosis not present

## 2022-09-24 DIAGNOSIS — N186 End stage renal disease: Secondary | ICD-10-CM | POA: Diagnosis not present

## 2022-09-24 DIAGNOSIS — D631 Anemia in chronic kidney disease: Secondary | ICD-10-CM | POA: Diagnosis not present

## 2022-09-25 DIAGNOSIS — N2581 Secondary hyperparathyroidism of renal origin: Secondary | ICD-10-CM | POA: Diagnosis not present

## 2022-09-25 DIAGNOSIS — Z4932 Encounter for adequacy testing for peritoneal dialysis: Secondary | ICD-10-CM | POA: Diagnosis not present

## 2022-09-25 DIAGNOSIS — N186 End stage renal disease: Secondary | ICD-10-CM | POA: Diagnosis not present

## 2022-09-25 DIAGNOSIS — D631 Anemia in chronic kidney disease: Secondary | ICD-10-CM | POA: Diagnosis not present

## 2022-09-25 DIAGNOSIS — K769 Liver disease, unspecified: Secondary | ICD-10-CM | POA: Diagnosis not present

## 2022-09-25 DIAGNOSIS — Z992 Dependence on renal dialysis: Secondary | ICD-10-CM | POA: Diagnosis not present

## 2022-09-26 DIAGNOSIS — N2581 Secondary hyperparathyroidism of renal origin: Secondary | ICD-10-CM | POA: Diagnosis not present

## 2022-09-26 DIAGNOSIS — D631 Anemia in chronic kidney disease: Secondary | ICD-10-CM | POA: Diagnosis not present

## 2022-09-26 DIAGNOSIS — Z4932 Encounter for adequacy testing for peritoneal dialysis: Secondary | ICD-10-CM | POA: Diagnosis not present

## 2022-09-26 DIAGNOSIS — Z992 Dependence on renal dialysis: Secondary | ICD-10-CM | POA: Diagnosis not present

## 2022-09-26 DIAGNOSIS — N186 End stage renal disease: Secondary | ICD-10-CM | POA: Diagnosis not present

## 2022-09-26 DIAGNOSIS — K769 Liver disease, unspecified: Secondary | ICD-10-CM | POA: Diagnosis not present

## 2022-09-27 DIAGNOSIS — K769 Liver disease, unspecified: Secondary | ICD-10-CM | POA: Diagnosis not present

## 2022-09-27 DIAGNOSIS — N186 End stage renal disease: Secondary | ICD-10-CM | POA: Diagnosis not present

## 2022-09-27 DIAGNOSIS — Z992 Dependence on renal dialysis: Secondary | ICD-10-CM | POA: Diagnosis not present

## 2022-09-27 DIAGNOSIS — D631 Anemia in chronic kidney disease: Secondary | ICD-10-CM | POA: Diagnosis not present

## 2022-09-27 DIAGNOSIS — N2581 Secondary hyperparathyroidism of renal origin: Secondary | ICD-10-CM | POA: Diagnosis not present

## 2022-09-27 DIAGNOSIS — Z4932 Encounter for adequacy testing for peritoneal dialysis: Secondary | ICD-10-CM | POA: Diagnosis not present

## 2022-09-28 DIAGNOSIS — K769 Liver disease, unspecified: Secondary | ICD-10-CM | POA: Diagnosis not present

## 2022-09-28 DIAGNOSIS — D631 Anemia in chronic kidney disease: Secondary | ICD-10-CM | POA: Diagnosis not present

## 2022-09-28 DIAGNOSIS — Z992 Dependence on renal dialysis: Secondary | ICD-10-CM | POA: Diagnosis not present

## 2022-09-28 DIAGNOSIS — Z4932 Encounter for adequacy testing for peritoneal dialysis: Secondary | ICD-10-CM | POA: Diagnosis not present

## 2022-09-28 DIAGNOSIS — N2581 Secondary hyperparathyroidism of renal origin: Secondary | ICD-10-CM | POA: Diagnosis not present

## 2022-09-28 DIAGNOSIS — N186 End stage renal disease: Secondary | ICD-10-CM | POA: Diagnosis not present

## 2022-09-29 DIAGNOSIS — N2581 Secondary hyperparathyroidism of renal origin: Secondary | ICD-10-CM | POA: Diagnosis not present

## 2022-09-29 DIAGNOSIS — Z992 Dependence on renal dialysis: Secondary | ICD-10-CM | POA: Diagnosis not present

## 2022-09-29 DIAGNOSIS — D631 Anemia in chronic kidney disease: Secondary | ICD-10-CM | POA: Diagnosis not present

## 2022-09-29 DIAGNOSIS — N186 End stage renal disease: Secondary | ICD-10-CM | POA: Diagnosis not present

## 2022-09-29 DIAGNOSIS — K769 Liver disease, unspecified: Secondary | ICD-10-CM | POA: Diagnosis not present

## 2022-09-29 DIAGNOSIS — Z4932 Encounter for adequacy testing for peritoneal dialysis: Secondary | ICD-10-CM | POA: Diagnosis not present

## 2022-09-30 DIAGNOSIS — Z992 Dependence on renal dialysis: Secondary | ICD-10-CM | POA: Diagnosis not present

## 2022-09-30 DIAGNOSIS — D631 Anemia in chronic kidney disease: Secondary | ICD-10-CM | POA: Diagnosis not present

## 2022-09-30 DIAGNOSIS — N186 End stage renal disease: Secondary | ICD-10-CM | POA: Diagnosis not present

## 2022-09-30 DIAGNOSIS — Z4932 Encounter for adequacy testing for peritoneal dialysis: Secondary | ICD-10-CM | POA: Diagnosis not present

## 2022-09-30 DIAGNOSIS — N2581 Secondary hyperparathyroidism of renal origin: Secondary | ICD-10-CM | POA: Diagnosis not present

## 2022-09-30 DIAGNOSIS — K769 Liver disease, unspecified: Secondary | ICD-10-CM | POA: Diagnosis not present

## 2022-10-01 DIAGNOSIS — Z4932 Encounter for adequacy testing for peritoneal dialysis: Secondary | ICD-10-CM | POA: Diagnosis not present

## 2022-10-01 DIAGNOSIS — N186 End stage renal disease: Secondary | ICD-10-CM | POA: Diagnosis not present

## 2022-10-01 DIAGNOSIS — Z992 Dependence on renal dialysis: Secondary | ICD-10-CM | POA: Diagnosis not present

## 2022-10-01 DIAGNOSIS — K769 Liver disease, unspecified: Secondary | ICD-10-CM | POA: Diagnosis not present

## 2022-10-01 DIAGNOSIS — N2581 Secondary hyperparathyroidism of renal origin: Secondary | ICD-10-CM | POA: Diagnosis not present

## 2022-10-01 DIAGNOSIS — D631 Anemia in chronic kidney disease: Secondary | ICD-10-CM | POA: Diagnosis not present

## 2022-10-02 DIAGNOSIS — Z4932 Encounter for adequacy testing for peritoneal dialysis: Secondary | ICD-10-CM | POA: Diagnosis not present

## 2022-10-02 DIAGNOSIS — N186 End stage renal disease: Secondary | ICD-10-CM | POA: Diagnosis not present

## 2022-10-02 DIAGNOSIS — D631 Anemia in chronic kidney disease: Secondary | ICD-10-CM | POA: Diagnosis not present

## 2022-10-02 DIAGNOSIS — K769 Liver disease, unspecified: Secondary | ICD-10-CM | POA: Diagnosis not present

## 2022-10-02 DIAGNOSIS — N2581 Secondary hyperparathyroidism of renal origin: Secondary | ICD-10-CM | POA: Diagnosis not present

## 2022-10-02 DIAGNOSIS — Z992 Dependence on renal dialysis: Secondary | ICD-10-CM | POA: Diagnosis not present

## 2022-10-03 DIAGNOSIS — N186 End stage renal disease: Secondary | ICD-10-CM | POA: Diagnosis not present

## 2022-10-03 DIAGNOSIS — Z4932 Encounter for adequacy testing for peritoneal dialysis: Secondary | ICD-10-CM | POA: Diagnosis not present

## 2022-10-03 DIAGNOSIS — D631 Anemia in chronic kidney disease: Secondary | ICD-10-CM | POA: Diagnosis not present

## 2022-10-03 DIAGNOSIS — N2581 Secondary hyperparathyroidism of renal origin: Secondary | ICD-10-CM | POA: Diagnosis not present

## 2022-10-03 DIAGNOSIS — K769 Liver disease, unspecified: Secondary | ICD-10-CM | POA: Diagnosis not present

## 2022-10-03 DIAGNOSIS — Z992 Dependence on renal dialysis: Secondary | ICD-10-CM | POA: Diagnosis not present

## 2022-10-04 DIAGNOSIS — Z4932 Encounter for adequacy testing for peritoneal dialysis: Secondary | ICD-10-CM | POA: Diagnosis not present

## 2022-10-04 DIAGNOSIS — D631 Anemia in chronic kidney disease: Secondary | ICD-10-CM | POA: Diagnosis not present

## 2022-10-04 DIAGNOSIS — N186 End stage renal disease: Secondary | ICD-10-CM | POA: Diagnosis not present

## 2022-10-04 DIAGNOSIS — N2581 Secondary hyperparathyroidism of renal origin: Secondary | ICD-10-CM | POA: Diagnosis not present

## 2022-10-04 DIAGNOSIS — K769 Liver disease, unspecified: Secondary | ICD-10-CM | POA: Diagnosis not present

## 2022-10-04 DIAGNOSIS — Z992 Dependence on renal dialysis: Secondary | ICD-10-CM | POA: Diagnosis not present

## 2022-10-05 DIAGNOSIS — D631 Anemia in chronic kidney disease: Secondary | ICD-10-CM | POA: Diagnosis not present

## 2022-10-05 DIAGNOSIS — Z4932 Encounter for adequacy testing for peritoneal dialysis: Secondary | ICD-10-CM | POA: Diagnosis not present

## 2022-10-05 DIAGNOSIS — N2581 Secondary hyperparathyroidism of renal origin: Secondary | ICD-10-CM | POA: Diagnosis not present

## 2022-10-05 DIAGNOSIS — N186 End stage renal disease: Secondary | ICD-10-CM | POA: Diagnosis not present

## 2022-10-05 DIAGNOSIS — Z992 Dependence on renal dialysis: Secondary | ICD-10-CM | POA: Diagnosis not present

## 2022-10-05 DIAGNOSIS — K769 Liver disease, unspecified: Secondary | ICD-10-CM | POA: Diagnosis not present

## 2022-10-06 DIAGNOSIS — K769 Liver disease, unspecified: Secondary | ICD-10-CM | POA: Diagnosis not present

## 2022-10-06 DIAGNOSIS — N186 End stage renal disease: Secondary | ICD-10-CM | POA: Diagnosis not present

## 2022-10-06 DIAGNOSIS — D631 Anemia in chronic kidney disease: Secondary | ICD-10-CM | POA: Diagnosis not present

## 2022-10-06 DIAGNOSIS — N2581 Secondary hyperparathyroidism of renal origin: Secondary | ICD-10-CM | POA: Diagnosis not present

## 2022-10-06 DIAGNOSIS — Z992 Dependence on renal dialysis: Secondary | ICD-10-CM | POA: Diagnosis not present

## 2022-10-06 DIAGNOSIS — Z4932 Encounter for adequacy testing for peritoneal dialysis: Secondary | ICD-10-CM | POA: Diagnosis not present

## 2022-10-07 DIAGNOSIS — Z4932 Encounter for adequacy testing for peritoneal dialysis: Secondary | ICD-10-CM | POA: Diagnosis not present

## 2022-10-07 DIAGNOSIS — K769 Liver disease, unspecified: Secondary | ICD-10-CM | POA: Diagnosis not present

## 2022-10-07 DIAGNOSIS — N2581 Secondary hyperparathyroidism of renal origin: Secondary | ICD-10-CM | POA: Diagnosis not present

## 2022-10-07 DIAGNOSIS — Z992 Dependence on renal dialysis: Secondary | ICD-10-CM | POA: Diagnosis not present

## 2022-10-07 DIAGNOSIS — D631 Anemia in chronic kidney disease: Secondary | ICD-10-CM | POA: Diagnosis not present

## 2022-10-07 DIAGNOSIS — N186 End stage renal disease: Secondary | ICD-10-CM | POA: Diagnosis not present

## 2022-10-08 DIAGNOSIS — K769 Liver disease, unspecified: Secondary | ICD-10-CM | POA: Diagnosis not present

## 2022-10-08 DIAGNOSIS — Z4932 Encounter for adequacy testing for peritoneal dialysis: Secondary | ICD-10-CM | POA: Diagnosis not present

## 2022-10-08 DIAGNOSIS — N186 End stage renal disease: Secondary | ICD-10-CM | POA: Diagnosis not present

## 2022-10-08 DIAGNOSIS — D631 Anemia in chronic kidney disease: Secondary | ICD-10-CM | POA: Diagnosis not present

## 2022-10-08 DIAGNOSIS — Z992 Dependence on renal dialysis: Secondary | ICD-10-CM | POA: Diagnosis not present

## 2022-10-08 DIAGNOSIS — N2581 Secondary hyperparathyroidism of renal origin: Secondary | ICD-10-CM | POA: Diagnosis not present

## 2022-10-09 DIAGNOSIS — N186 End stage renal disease: Secondary | ICD-10-CM | POA: Diagnosis not present

## 2022-10-09 DIAGNOSIS — N2581 Secondary hyperparathyroidism of renal origin: Secondary | ICD-10-CM | POA: Diagnosis not present

## 2022-10-09 DIAGNOSIS — Z992 Dependence on renal dialysis: Secondary | ICD-10-CM | POA: Diagnosis not present

## 2022-10-09 DIAGNOSIS — D631 Anemia in chronic kidney disease: Secondary | ICD-10-CM | POA: Diagnosis not present

## 2022-10-09 DIAGNOSIS — Z4932 Encounter for adequacy testing for peritoneal dialysis: Secondary | ICD-10-CM | POA: Diagnosis not present

## 2022-10-09 DIAGNOSIS — K769 Liver disease, unspecified: Secondary | ICD-10-CM | POA: Diagnosis not present

## 2022-10-10 DIAGNOSIS — Z992 Dependence on renal dialysis: Secondary | ICD-10-CM | POA: Diagnosis not present

## 2022-10-10 DIAGNOSIS — Z4932 Encounter for adequacy testing for peritoneal dialysis: Secondary | ICD-10-CM | POA: Diagnosis not present

## 2022-10-10 DIAGNOSIS — D631 Anemia in chronic kidney disease: Secondary | ICD-10-CM | POA: Diagnosis not present

## 2022-10-10 DIAGNOSIS — N186 End stage renal disease: Secondary | ICD-10-CM | POA: Diagnosis not present

## 2022-10-10 DIAGNOSIS — N2581 Secondary hyperparathyroidism of renal origin: Secondary | ICD-10-CM | POA: Diagnosis not present

## 2022-10-10 DIAGNOSIS — K769 Liver disease, unspecified: Secondary | ICD-10-CM | POA: Diagnosis not present

## 2022-10-11 DIAGNOSIS — D631 Anemia in chronic kidney disease: Secondary | ICD-10-CM | POA: Diagnosis not present

## 2022-10-11 DIAGNOSIS — N186 End stage renal disease: Secondary | ICD-10-CM | POA: Diagnosis not present

## 2022-10-11 DIAGNOSIS — I129 Hypertensive chronic kidney disease with stage 1 through stage 4 chronic kidney disease, or unspecified chronic kidney disease: Secondary | ICD-10-CM | POA: Diagnosis not present

## 2022-10-11 DIAGNOSIS — Z992 Dependence on renal dialysis: Secondary | ICD-10-CM | POA: Diagnosis not present

## 2022-10-11 DIAGNOSIS — K769 Liver disease, unspecified: Secondary | ICD-10-CM | POA: Diagnosis not present

## 2022-10-11 DIAGNOSIS — Z4932 Encounter for adequacy testing for peritoneal dialysis: Secondary | ICD-10-CM | POA: Diagnosis not present

## 2022-10-11 DIAGNOSIS — N2581 Secondary hyperparathyroidism of renal origin: Secondary | ICD-10-CM | POA: Diagnosis not present

## 2022-10-12 DIAGNOSIS — Z4932 Encounter for adequacy testing for peritoneal dialysis: Secondary | ICD-10-CM | POA: Diagnosis not present

## 2022-10-12 DIAGNOSIS — N186 End stage renal disease: Secondary | ICD-10-CM | POA: Diagnosis not present

## 2022-10-12 DIAGNOSIS — D631 Anemia in chronic kidney disease: Secondary | ICD-10-CM | POA: Diagnosis not present

## 2022-10-12 DIAGNOSIS — Z992 Dependence on renal dialysis: Secondary | ICD-10-CM | POA: Diagnosis not present

## 2022-10-12 DIAGNOSIS — K769 Liver disease, unspecified: Secondary | ICD-10-CM | POA: Diagnosis not present

## 2022-10-12 DIAGNOSIS — N2581 Secondary hyperparathyroidism of renal origin: Secondary | ICD-10-CM | POA: Diagnosis not present

## 2022-10-13 DIAGNOSIS — K769 Liver disease, unspecified: Secondary | ICD-10-CM | POA: Diagnosis not present

## 2022-10-13 DIAGNOSIS — N186 End stage renal disease: Secondary | ICD-10-CM | POA: Diagnosis not present

## 2022-10-13 DIAGNOSIS — N2581 Secondary hyperparathyroidism of renal origin: Secondary | ICD-10-CM | POA: Diagnosis not present

## 2022-10-13 DIAGNOSIS — D631 Anemia in chronic kidney disease: Secondary | ICD-10-CM | POA: Diagnosis not present

## 2022-10-13 DIAGNOSIS — Z992 Dependence on renal dialysis: Secondary | ICD-10-CM | POA: Diagnosis not present

## 2022-10-13 DIAGNOSIS — Z4932 Encounter for adequacy testing for peritoneal dialysis: Secondary | ICD-10-CM | POA: Diagnosis not present

## 2022-10-14 DIAGNOSIS — N186 End stage renal disease: Secondary | ICD-10-CM | POA: Diagnosis not present

## 2022-10-14 DIAGNOSIS — N2581 Secondary hyperparathyroidism of renal origin: Secondary | ICD-10-CM | POA: Diagnosis not present

## 2022-10-14 DIAGNOSIS — Z4932 Encounter for adequacy testing for peritoneal dialysis: Secondary | ICD-10-CM | POA: Diagnosis not present

## 2022-10-14 DIAGNOSIS — D631 Anemia in chronic kidney disease: Secondary | ICD-10-CM | POA: Diagnosis not present

## 2022-10-14 DIAGNOSIS — Z992 Dependence on renal dialysis: Secondary | ICD-10-CM | POA: Diagnosis not present

## 2022-10-14 DIAGNOSIS — K769 Liver disease, unspecified: Secondary | ICD-10-CM | POA: Diagnosis not present

## 2022-10-15 DIAGNOSIS — K769 Liver disease, unspecified: Secondary | ICD-10-CM | POA: Diagnosis not present

## 2022-10-15 DIAGNOSIS — N186 End stage renal disease: Secondary | ICD-10-CM | POA: Diagnosis not present

## 2022-10-15 DIAGNOSIS — D631 Anemia in chronic kidney disease: Secondary | ICD-10-CM | POA: Diagnosis not present

## 2022-10-15 DIAGNOSIS — Z4932 Encounter for adequacy testing for peritoneal dialysis: Secondary | ICD-10-CM | POA: Diagnosis not present

## 2022-10-15 DIAGNOSIS — Z992 Dependence on renal dialysis: Secondary | ICD-10-CM | POA: Diagnosis not present

## 2022-10-15 DIAGNOSIS — N2581 Secondary hyperparathyroidism of renal origin: Secondary | ICD-10-CM | POA: Diagnosis not present

## 2022-10-16 DIAGNOSIS — Z4932 Encounter for adequacy testing for peritoneal dialysis: Secondary | ICD-10-CM | POA: Diagnosis not present

## 2022-10-16 DIAGNOSIS — D631 Anemia in chronic kidney disease: Secondary | ICD-10-CM | POA: Diagnosis not present

## 2022-10-16 DIAGNOSIS — N186 End stage renal disease: Secondary | ICD-10-CM | POA: Diagnosis not present

## 2022-10-16 DIAGNOSIS — K769 Liver disease, unspecified: Secondary | ICD-10-CM | POA: Diagnosis not present

## 2022-10-16 DIAGNOSIS — Z992 Dependence on renal dialysis: Secondary | ICD-10-CM | POA: Diagnosis not present

## 2022-10-16 DIAGNOSIS — N2581 Secondary hyperparathyroidism of renal origin: Secondary | ICD-10-CM | POA: Diagnosis not present

## 2022-10-17 DIAGNOSIS — D631 Anemia in chronic kidney disease: Secondary | ICD-10-CM | POA: Diagnosis not present

## 2022-10-17 DIAGNOSIS — N2581 Secondary hyperparathyroidism of renal origin: Secondary | ICD-10-CM | POA: Diagnosis not present

## 2022-10-17 DIAGNOSIS — Z4932 Encounter for adequacy testing for peritoneal dialysis: Secondary | ICD-10-CM | POA: Diagnosis not present

## 2022-10-17 DIAGNOSIS — Z992 Dependence on renal dialysis: Secondary | ICD-10-CM | POA: Diagnosis not present

## 2022-10-17 DIAGNOSIS — N186 End stage renal disease: Secondary | ICD-10-CM | POA: Diagnosis not present

## 2022-10-17 DIAGNOSIS — K769 Liver disease, unspecified: Secondary | ICD-10-CM | POA: Diagnosis not present

## 2022-10-18 DIAGNOSIS — Z992 Dependence on renal dialysis: Secondary | ICD-10-CM | POA: Diagnosis not present

## 2022-10-18 DIAGNOSIS — K769 Liver disease, unspecified: Secondary | ICD-10-CM | POA: Diagnosis not present

## 2022-10-18 DIAGNOSIS — Z4932 Encounter for adequacy testing for peritoneal dialysis: Secondary | ICD-10-CM | POA: Diagnosis not present

## 2022-10-18 DIAGNOSIS — D631 Anemia in chronic kidney disease: Secondary | ICD-10-CM | POA: Diagnosis not present

## 2022-10-18 DIAGNOSIS — N2581 Secondary hyperparathyroidism of renal origin: Secondary | ICD-10-CM | POA: Diagnosis not present

## 2022-10-18 DIAGNOSIS — N186 End stage renal disease: Secondary | ICD-10-CM | POA: Diagnosis not present

## 2022-10-19 DIAGNOSIS — N2581 Secondary hyperparathyroidism of renal origin: Secondary | ICD-10-CM | POA: Diagnosis not present

## 2022-10-19 DIAGNOSIS — K769 Liver disease, unspecified: Secondary | ICD-10-CM | POA: Diagnosis not present

## 2022-10-19 DIAGNOSIS — Z992 Dependence on renal dialysis: Secondary | ICD-10-CM | POA: Diagnosis not present

## 2022-10-19 DIAGNOSIS — Z4932 Encounter for adequacy testing for peritoneal dialysis: Secondary | ICD-10-CM | POA: Diagnosis not present

## 2022-10-19 DIAGNOSIS — N186 End stage renal disease: Secondary | ICD-10-CM | POA: Diagnosis not present

## 2022-10-19 DIAGNOSIS — D631 Anemia in chronic kidney disease: Secondary | ICD-10-CM | POA: Diagnosis not present

## 2022-10-20 DIAGNOSIS — Z992 Dependence on renal dialysis: Secondary | ICD-10-CM | POA: Diagnosis not present

## 2022-10-20 DIAGNOSIS — N186 End stage renal disease: Secondary | ICD-10-CM | POA: Diagnosis not present

## 2022-10-20 DIAGNOSIS — Z4932 Encounter for adequacy testing for peritoneal dialysis: Secondary | ICD-10-CM | POA: Diagnosis not present

## 2022-10-20 DIAGNOSIS — D631 Anemia in chronic kidney disease: Secondary | ICD-10-CM | POA: Diagnosis not present

## 2022-10-20 DIAGNOSIS — K769 Liver disease, unspecified: Secondary | ICD-10-CM | POA: Diagnosis not present

## 2022-10-20 DIAGNOSIS — N2581 Secondary hyperparathyroidism of renal origin: Secondary | ICD-10-CM | POA: Diagnosis not present

## 2022-10-21 DIAGNOSIS — N2581 Secondary hyperparathyroidism of renal origin: Secondary | ICD-10-CM | POA: Diagnosis not present

## 2022-10-21 DIAGNOSIS — D631 Anemia in chronic kidney disease: Secondary | ICD-10-CM | POA: Diagnosis not present

## 2022-10-21 DIAGNOSIS — Z992 Dependence on renal dialysis: Secondary | ICD-10-CM | POA: Diagnosis not present

## 2022-10-21 DIAGNOSIS — Z4932 Encounter for adequacy testing for peritoneal dialysis: Secondary | ICD-10-CM | POA: Diagnosis not present

## 2022-10-21 DIAGNOSIS — K769 Liver disease, unspecified: Secondary | ICD-10-CM | POA: Diagnosis not present

## 2022-10-21 DIAGNOSIS — N186 End stage renal disease: Secondary | ICD-10-CM | POA: Diagnosis not present

## 2022-10-22 DIAGNOSIS — Z992 Dependence on renal dialysis: Secondary | ICD-10-CM | POA: Diagnosis not present

## 2022-10-22 DIAGNOSIS — K769 Liver disease, unspecified: Secondary | ICD-10-CM | POA: Diagnosis not present

## 2022-10-22 DIAGNOSIS — D631 Anemia in chronic kidney disease: Secondary | ICD-10-CM | POA: Diagnosis not present

## 2022-10-22 DIAGNOSIS — Z4932 Encounter for adequacy testing for peritoneal dialysis: Secondary | ICD-10-CM | POA: Diagnosis not present

## 2022-10-22 DIAGNOSIS — N2581 Secondary hyperparathyroidism of renal origin: Secondary | ICD-10-CM | POA: Diagnosis not present

## 2022-10-22 DIAGNOSIS — N186 End stage renal disease: Secondary | ICD-10-CM | POA: Diagnosis not present

## 2022-10-23 DIAGNOSIS — N186 End stage renal disease: Secondary | ICD-10-CM | POA: Diagnosis not present

## 2022-10-23 DIAGNOSIS — N2581 Secondary hyperparathyroidism of renal origin: Secondary | ICD-10-CM | POA: Diagnosis not present

## 2022-10-23 DIAGNOSIS — Z992 Dependence on renal dialysis: Secondary | ICD-10-CM | POA: Diagnosis not present

## 2022-10-23 DIAGNOSIS — Z4932 Encounter for adequacy testing for peritoneal dialysis: Secondary | ICD-10-CM | POA: Diagnosis not present

## 2022-10-23 DIAGNOSIS — D631 Anemia in chronic kidney disease: Secondary | ICD-10-CM | POA: Diagnosis not present

## 2022-10-23 DIAGNOSIS — K769 Liver disease, unspecified: Secondary | ICD-10-CM | POA: Diagnosis not present

## 2022-10-24 DIAGNOSIS — N2581 Secondary hyperparathyroidism of renal origin: Secondary | ICD-10-CM | POA: Diagnosis not present

## 2022-10-24 DIAGNOSIS — N186 End stage renal disease: Secondary | ICD-10-CM | POA: Diagnosis not present

## 2022-10-24 DIAGNOSIS — D631 Anemia in chronic kidney disease: Secondary | ICD-10-CM | POA: Diagnosis not present

## 2022-10-24 DIAGNOSIS — K769 Liver disease, unspecified: Secondary | ICD-10-CM | POA: Diagnosis not present

## 2022-10-24 DIAGNOSIS — Z992 Dependence on renal dialysis: Secondary | ICD-10-CM | POA: Diagnosis not present

## 2022-10-24 DIAGNOSIS — Z4932 Encounter for adequacy testing for peritoneal dialysis: Secondary | ICD-10-CM | POA: Diagnosis not present

## 2022-10-25 DIAGNOSIS — N2581 Secondary hyperparathyroidism of renal origin: Secondary | ICD-10-CM | POA: Diagnosis not present

## 2022-10-25 DIAGNOSIS — Z4932 Encounter for adequacy testing for peritoneal dialysis: Secondary | ICD-10-CM | POA: Diagnosis not present

## 2022-10-25 DIAGNOSIS — K769 Liver disease, unspecified: Secondary | ICD-10-CM | POA: Diagnosis not present

## 2022-10-25 DIAGNOSIS — D631 Anemia in chronic kidney disease: Secondary | ICD-10-CM | POA: Diagnosis not present

## 2022-10-25 DIAGNOSIS — N186 End stage renal disease: Secondary | ICD-10-CM | POA: Diagnosis not present

## 2022-10-25 DIAGNOSIS — Z992 Dependence on renal dialysis: Secondary | ICD-10-CM | POA: Diagnosis not present

## 2022-10-26 DIAGNOSIS — N2581 Secondary hyperparathyroidism of renal origin: Secondary | ICD-10-CM | POA: Diagnosis not present

## 2022-10-26 DIAGNOSIS — K769 Liver disease, unspecified: Secondary | ICD-10-CM | POA: Diagnosis not present

## 2022-10-26 DIAGNOSIS — Z4932 Encounter for adequacy testing for peritoneal dialysis: Secondary | ICD-10-CM | POA: Diagnosis not present

## 2022-10-26 DIAGNOSIS — D631 Anemia in chronic kidney disease: Secondary | ICD-10-CM | POA: Diagnosis not present

## 2022-10-26 DIAGNOSIS — N186 End stage renal disease: Secondary | ICD-10-CM | POA: Diagnosis not present

## 2022-10-26 DIAGNOSIS — Z992 Dependence on renal dialysis: Secondary | ICD-10-CM | POA: Diagnosis not present

## 2022-10-27 DIAGNOSIS — Z4932 Encounter for adequacy testing for peritoneal dialysis: Secondary | ICD-10-CM | POA: Diagnosis not present

## 2022-10-27 DIAGNOSIS — D631 Anemia in chronic kidney disease: Secondary | ICD-10-CM | POA: Diagnosis not present

## 2022-10-27 DIAGNOSIS — Z992 Dependence on renal dialysis: Secondary | ICD-10-CM | POA: Diagnosis not present

## 2022-10-27 DIAGNOSIS — K769 Liver disease, unspecified: Secondary | ICD-10-CM | POA: Diagnosis not present

## 2022-10-27 DIAGNOSIS — N2581 Secondary hyperparathyroidism of renal origin: Secondary | ICD-10-CM | POA: Diagnosis not present

## 2022-10-27 DIAGNOSIS — N186 End stage renal disease: Secondary | ICD-10-CM | POA: Diagnosis not present

## 2022-10-28 DIAGNOSIS — N2581 Secondary hyperparathyroidism of renal origin: Secondary | ICD-10-CM | POA: Diagnosis not present

## 2022-10-28 DIAGNOSIS — Z992 Dependence on renal dialysis: Secondary | ICD-10-CM | POA: Diagnosis not present

## 2022-10-28 DIAGNOSIS — K769 Liver disease, unspecified: Secondary | ICD-10-CM | POA: Diagnosis not present

## 2022-10-28 DIAGNOSIS — N186 End stage renal disease: Secondary | ICD-10-CM | POA: Diagnosis not present

## 2022-10-28 DIAGNOSIS — Z4932 Encounter for adequacy testing for peritoneal dialysis: Secondary | ICD-10-CM | POA: Diagnosis not present

## 2022-10-28 DIAGNOSIS — D631 Anemia in chronic kidney disease: Secondary | ICD-10-CM | POA: Diagnosis not present

## 2022-10-29 DIAGNOSIS — D631 Anemia in chronic kidney disease: Secondary | ICD-10-CM | POA: Diagnosis not present

## 2022-10-29 DIAGNOSIS — Z992 Dependence on renal dialysis: Secondary | ICD-10-CM | POA: Diagnosis not present

## 2022-10-29 DIAGNOSIS — N2581 Secondary hyperparathyroidism of renal origin: Secondary | ICD-10-CM | POA: Diagnosis not present

## 2022-10-29 DIAGNOSIS — N186 End stage renal disease: Secondary | ICD-10-CM | POA: Diagnosis not present

## 2022-10-29 DIAGNOSIS — Z4932 Encounter for adequacy testing for peritoneal dialysis: Secondary | ICD-10-CM | POA: Diagnosis not present

## 2022-10-29 DIAGNOSIS — K769 Liver disease, unspecified: Secondary | ICD-10-CM | POA: Diagnosis not present

## 2022-10-30 DIAGNOSIS — Z992 Dependence on renal dialysis: Secondary | ICD-10-CM | POA: Diagnosis not present

## 2022-10-30 DIAGNOSIS — N2581 Secondary hyperparathyroidism of renal origin: Secondary | ICD-10-CM | POA: Diagnosis not present

## 2022-10-30 DIAGNOSIS — D631 Anemia in chronic kidney disease: Secondary | ICD-10-CM | POA: Diagnosis not present

## 2022-10-30 DIAGNOSIS — K769 Liver disease, unspecified: Secondary | ICD-10-CM | POA: Diagnosis not present

## 2022-10-30 DIAGNOSIS — Z4932 Encounter for adequacy testing for peritoneal dialysis: Secondary | ICD-10-CM | POA: Diagnosis not present

## 2022-10-30 DIAGNOSIS — N186 End stage renal disease: Secondary | ICD-10-CM | POA: Diagnosis not present

## 2022-10-31 DIAGNOSIS — Z4932 Encounter for adequacy testing for peritoneal dialysis: Secondary | ICD-10-CM | POA: Diagnosis not present

## 2022-10-31 DIAGNOSIS — D631 Anemia in chronic kidney disease: Secondary | ICD-10-CM | POA: Diagnosis not present

## 2022-10-31 DIAGNOSIS — K769 Liver disease, unspecified: Secondary | ICD-10-CM | POA: Diagnosis not present

## 2022-10-31 DIAGNOSIS — N186 End stage renal disease: Secondary | ICD-10-CM | POA: Diagnosis not present

## 2022-10-31 DIAGNOSIS — Z992 Dependence on renal dialysis: Secondary | ICD-10-CM | POA: Diagnosis not present

## 2022-10-31 DIAGNOSIS — N2581 Secondary hyperparathyroidism of renal origin: Secondary | ICD-10-CM | POA: Diagnosis not present

## 2022-11-01 DIAGNOSIS — D631 Anemia in chronic kidney disease: Secondary | ICD-10-CM | POA: Diagnosis not present

## 2022-11-01 DIAGNOSIS — Z992 Dependence on renal dialysis: Secondary | ICD-10-CM | POA: Diagnosis not present

## 2022-11-01 DIAGNOSIS — N186 End stage renal disease: Secondary | ICD-10-CM | POA: Diagnosis not present

## 2022-11-01 DIAGNOSIS — N2581 Secondary hyperparathyroidism of renal origin: Secondary | ICD-10-CM | POA: Diagnosis not present

## 2022-11-01 DIAGNOSIS — Z4932 Encounter for adequacy testing for peritoneal dialysis: Secondary | ICD-10-CM | POA: Diagnosis not present

## 2022-11-01 DIAGNOSIS — K769 Liver disease, unspecified: Secondary | ICD-10-CM | POA: Diagnosis not present

## 2022-11-02 DIAGNOSIS — D631 Anemia in chronic kidney disease: Secondary | ICD-10-CM | POA: Diagnosis not present

## 2022-11-02 DIAGNOSIS — N186 End stage renal disease: Secondary | ICD-10-CM | POA: Diagnosis not present

## 2022-11-02 DIAGNOSIS — K769 Liver disease, unspecified: Secondary | ICD-10-CM | POA: Diagnosis not present

## 2022-11-02 DIAGNOSIS — Z992 Dependence on renal dialysis: Secondary | ICD-10-CM | POA: Diagnosis not present

## 2022-11-02 DIAGNOSIS — N2581 Secondary hyperparathyroidism of renal origin: Secondary | ICD-10-CM | POA: Diagnosis not present

## 2022-11-02 DIAGNOSIS — Z4932 Encounter for adequacy testing for peritoneal dialysis: Secondary | ICD-10-CM | POA: Diagnosis not present

## 2022-11-03 DIAGNOSIS — Z992 Dependence on renal dialysis: Secondary | ICD-10-CM | POA: Diagnosis not present

## 2022-11-03 DIAGNOSIS — D631 Anemia in chronic kidney disease: Secondary | ICD-10-CM | POA: Diagnosis not present

## 2022-11-03 DIAGNOSIS — K769 Liver disease, unspecified: Secondary | ICD-10-CM | POA: Diagnosis not present

## 2022-11-03 DIAGNOSIS — N186 End stage renal disease: Secondary | ICD-10-CM | POA: Diagnosis not present

## 2022-11-03 DIAGNOSIS — Z4932 Encounter for adequacy testing for peritoneal dialysis: Secondary | ICD-10-CM | POA: Diagnosis not present

## 2022-11-03 DIAGNOSIS — N2581 Secondary hyperparathyroidism of renal origin: Secondary | ICD-10-CM | POA: Diagnosis not present

## 2022-11-04 DIAGNOSIS — D631 Anemia in chronic kidney disease: Secondary | ICD-10-CM | POA: Diagnosis not present

## 2022-11-04 DIAGNOSIS — Z4932 Encounter for adequacy testing for peritoneal dialysis: Secondary | ICD-10-CM | POA: Diagnosis not present

## 2022-11-04 DIAGNOSIS — N2581 Secondary hyperparathyroidism of renal origin: Secondary | ICD-10-CM | POA: Diagnosis not present

## 2022-11-04 DIAGNOSIS — N186 End stage renal disease: Secondary | ICD-10-CM | POA: Diagnosis not present

## 2022-11-04 DIAGNOSIS — K769 Liver disease, unspecified: Secondary | ICD-10-CM | POA: Diagnosis not present

## 2022-11-04 DIAGNOSIS — Z992 Dependence on renal dialysis: Secondary | ICD-10-CM | POA: Diagnosis not present

## 2022-11-05 DIAGNOSIS — N186 End stage renal disease: Secondary | ICD-10-CM | POA: Diagnosis not present

## 2022-11-05 DIAGNOSIS — D631 Anemia in chronic kidney disease: Secondary | ICD-10-CM | POA: Diagnosis not present

## 2022-11-05 DIAGNOSIS — K769 Liver disease, unspecified: Secondary | ICD-10-CM | POA: Diagnosis not present

## 2022-11-05 DIAGNOSIS — N2581 Secondary hyperparathyroidism of renal origin: Secondary | ICD-10-CM | POA: Diagnosis not present

## 2022-11-05 DIAGNOSIS — Z992 Dependence on renal dialysis: Secondary | ICD-10-CM | POA: Diagnosis not present

## 2022-11-05 DIAGNOSIS — Z4932 Encounter for adequacy testing for peritoneal dialysis: Secondary | ICD-10-CM | POA: Diagnosis not present

## 2022-11-06 DIAGNOSIS — N186 End stage renal disease: Secondary | ICD-10-CM | POA: Diagnosis not present

## 2022-11-06 DIAGNOSIS — Z992 Dependence on renal dialysis: Secondary | ICD-10-CM | POA: Diagnosis not present

## 2022-11-06 DIAGNOSIS — K769 Liver disease, unspecified: Secondary | ICD-10-CM | POA: Diagnosis not present

## 2022-11-06 DIAGNOSIS — Z4932 Encounter for adequacy testing for peritoneal dialysis: Secondary | ICD-10-CM | POA: Diagnosis not present

## 2022-11-06 DIAGNOSIS — N2581 Secondary hyperparathyroidism of renal origin: Secondary | ICD-10-CM | POA: Diagnosis not present

## 2022-11-06 DIAGNOSIS — D631 Anemia in chronic kidney disease: Secondary | ICD-10-CM | POA: Diagnosis not present

## 2022-11-07 DIAGNOSIS — K769 Liver disease, unspecified: Secondary | ICD-10-CM | POA: Diagnosis not present

## 2022-11-07 DIAGNOSIS — Z4932 Encounter for adequacy testing for peritoneal dialysis: Secondary | ICD-10-CM | POA: Diagnosis not present

## 2022-11-07 DIAGNOSIS — Z992 Dependence on renal dialysis: Secondary | ICD-10-CM | POA: Diagnosis not present

## 2022-11-07 DIAGNOSIS — D631 Anemia in chronic kidney disease: Secondary | ICD-10-CM | POA: Diagnosis not present

## 2022-11-07 DIAGNOSIS — N2581 Secondary hyperparathyroidism of renal origin: Secondary | ICD-10-CM | POA: Diagnosis not present

## 2022-11-07 DIAGNOSIS — N186 End stage renal disease: Secondary | ICD-10-CM | POA: Diagnosis not present

## 2022-11-08 DIAGNOSIS — Z4932 Encounter for adequacy testing for peritoneal dialysis: Secondary | ICD-10-CM | POA: Diagnosis not present

## 2022-11-08 DIAGNOSIS — D631 Anemia in chronic kidney disease: Secondary | ICD-10-CM | POA: Diagnosis not present

## 2022-11-08 DIAGNOSIS — N2581 Secondary hyperparathyroidism of renal origin: Secondary | ICD-10-CM | POA: Diagnosis not present

## 2022-11-08 DIAGNOSIS — K769 Liver disease, unspecified: Secondary | ICD-10-CM | POA: Diagnosis not present

## 2022-11-08 DIAGNOSIS — Z992 Dependence on renal dialysis: Secondary | ICD-10-CM | POA: Diagnosis not present

## 2022-11-08 DIAGNOSIS — N186 End stage renal disease: Secondary | ICD-10-CM | POA: Diagnosis not present

## 2022-11-09 DIAGNOSIS — N2581 Secondary hyperparathyroidism of renal origin: Secondary | ICD-10-CM | POA: Diagnosis not present

## 2022-11-09 DIAGNOSIS — Z992 Dependence on renal dialysis: Secondary | ICD-10-CM | POA: Diagnosis not present

## 2022-11-09 DIAGNOSIS — Z4932 Encounter for adequacy testing for peritoneal dialysis: Secondary | ICD-10-CM | POA: Diagnosis not present

## 2022-11-09 DIAGNOSIS — N186 End stage renal disease: Secondary | ICD-10-CM | POA: Diagnosis not present

## 2022-11-09 DIAGNOSIS — D631 Anemia in chronic kidney disease: Secondary | ICD-10-CM | POA: Diagnosis not present

## 2022-11-09 DIAGNOSIS — K769 Liver disease, unspecified: Secondary | ICD-10-CM | POA: Diagnosis not present

## 2022-11-10 DIAGNOSIS — Z992 Dependence on renal dialysis: Secondary | ICD-10-CM | POA: Diagnosis not present

## 2022-11-10 DIAGNOSIS — K769 Liver disease, unspecified: Secondary | ICD-10-CM | POA: Diagnosis not present

## 2022-11-10 DIAGNOSIS — D631 Anemia in chronic kidney disease: Secondary | ICD-10-CM | POA: Diagnosis not present

## 2022-11-10 DIAGNOSIS — N186 End stage renal disease: Secondary | ICD-10-CM | POA: Diagnosis not present

## 2022-11-10 DIAGNOSIS — N2581 Secondary hyperparathyroidism of renal origin: Secondary | ICD-10-CM | POA: Diagnosis not present

## 2022-11-10 DIAGNOSIS — Z4932 Encounter for adequacy testing for peritoneal dialysis: Secondary | ICD-10-CM | POA: Diagnosis not present

## 2022-11-11 DIAGNOSIS — Z992 Dependence on renal dialysis: Secondary | ICD-10-CM | POA: Diagnosis not present

## 2022-11-11 DIAGNOSIS — I129 Hypertensive chronic kidney disease with stage 1 through stage 4 chronic kidney disease, or unspecified chronic kidney disease: Secondary | ICD-10-CM | POA: Diagnosis not present

## 2022-11-11 DIAGNOSIS — D631 Anemia in chronic kidney disease: Secondary | ICD-10-CM | POA: Diagnosis not present

## 2022-11-11 DIAGNOSIS — K769 Liver disease, unspecified: Secondary | ICD-10-CM | POA: Diagnosis not present

## 2022-11-11 DIAGNOSIS — Z4932 Encounter for adequacy testing for peritoneal dialysis: Secondary | ICD-10-CM | POA: Diagnosis not present

## 2022-11-11 DIAGNOSIS — N186 End stage renal disease: Secondary | ICD-10-CM | POA: Diagnosis not present

## 2022-11-11 DIAGNOSIS — N2581 Secondary hyperparathyroidism of renal origin: Secondary | ICD-10-CM | POA: Diagnosis not present

## 2022-11-12 DIAGNOSIS — D631 Anemia in chronic kidney disease: Secondary | ICD-10-CM | POA: Diagnosis not present

## 2022-11-12 DIAGNOSIS — Z4932 Encounter for adequacy testing for peritoneal dialysis: Secondary | ICD-10-CM | POA: Diagnosis not present

## 2022-11-12 DIAGNOSIS — D509 Iron deficiency anemia, unspecified: Secondary | ICD-10-CM | POA: Diagnosis not present

## 2022-11-12 DIAGNOSIS — N186 End stage renal disease: Secondary | ICD-10-CM | POA: Diagnosis not present

## 2022-11-12 DIAGNOSIS — Z992 Dependence on renal dialysis: Secondary | ICD-10-CM | POA: Diagnosis not present

## 2022-11-12 DIAGNOSIS — N2581 Secondary hyperparathyroidism of renal origin: Secondary | ICD-10-CM | POA: Diagnosis not present

## 2022-11-13 DIAGNOSIS — Z992 Dependence on renal dialysis: Secondary | ICD-10-CM | POA: Diagnosis not present

## 2022-11-13 DIAGNOSIS — N2581 Secondary hyperparathyroidism of renal origin: Secondary | ICD-10-CM | POA: Diagnosis not present

## 2022-11-13 DIAGNOSIS — N186 End stage renal disease: Secondary | ICD-10-CM | POA: Diagnosis not present

## 2022-11-13 DIAGNOSIS — D509 Iron deficiency anemia, unspecified: Secondary | ICD-10-CM | POA: Diagnosis not present

## 2022-11-13 DIAGNOSIS — D631 Anemia in chronic kidney disease: Secondary | ICD-10-CM | POA: Diagnosis not present

## 2022-11-13 DIAGNOSIS — Z4932 Encounter for adequacy testing for peritoneal dialysis: Secondary | ICD-10-CM | POA: Diagnosis not present

## 2022-11-14 DIAGNOSIS — N2581 Secondary hyperparathyroidism of renal origin: Secondary | ICD-10-CM | POA: Diagnosis not present

## 2022-11-14 DIAGNOSIS — D631 Anemia in chronic kidney disease: Secondary | ICD-10-CM | POA: Diagnosis not present

## 2022-11-14 DIAGNOSIS — N186 End stage renal disease: Secondary | ICD-10-CM | POA: Diagnosis not present

## 2022-11-14 DIAGNOSIS — Z4932 Encounter for adequacy testing for peritoneal dialysis: Secondary | ICD-10-CM | POA: Diagnosis not present

## 2022-11-14 DIAGNOSIS — Z992 Dependence on renal dialysis: Secondary | ICD-10-CM | POA: Diagnosis not present

## 2022-11-14 DIAGNOSIS — D509 Iron deficiency anemia, unspecified: Secondary | ICD-10-CM | POA: Diagnosis not present

## 2022-11-15 DIAGNOSIS — Z992 Dependence on renal dialysis: Secondary | ICD-10-CM | POA: Diagnosis not present

## 2022-11-15 DIAGNOSIS — Z4932 Encounter for adequacy testing for peritoneal dialysis: Secondary | ICD-10-CM | POA: Diagnosis not present

## 2022-11-15 DIAGNOSIS — D631 Anemia in chronic kidney disease: Secondary | ICD-10-CM | POA: Diagnosis not present

## 2022-11-15 DIAGNOSIS — N2581 Secondary hyperparathyroidism of renal origin: Secondary | ICD-10-CM | POA: Diagnosis not present

## 2022-11-15 DIAGNOSIS — D509 Iron deficiency anemia, unspecified: Secondary | ICD-10-CM | POA: Diagnosis not present

## 2022-11-15 DIAGNOSIS — N186 End stage renal disease: Secondary | ICD-10-CM | POA: Diagnosis not present

## 2022-11-16 DIAGNOSIS — D631 Anemia in chronic kidney disease: Secondary | ICD-10-CM | POA: Diagnosis not present

## 2022-11-16 DIAGNOSIS — D509 Iron deficiency anemia, unspecified: Secondary | ICD-10-CM | POA: Diagnosis not present

## 2022-11-16 DIAGNOSIS — Z4932 Encounter for adequacy testing for peritoneal dialysis: Secondary | ICD-10-CM | POA: Diagnosis not present

## 2022-11-16 DIAGNOSIS — N186 End stage renal disease: Secondary | ICD-10-CM | POA: Diagnosis not present

## 2022-11-16 DIAGNOSIS — N2581 Secondary hyperparathyroidism of renal origin: Secondary | ICD-10-CM | POA: Diagnosis not present

## 2022-11-16 DIAGNOSIS — Z992 Dependence on renal dialysis: Secondary | ICD-10-CM | POA: Diagnosis not present

## 2022-11-17 DIAGNOSIS — Z992 Dependence on renal dialysis: Secondary | ICD-10-CM | POA: Diagnosis not present

## 2022-11-17 DIAGNOSIS — D631 Anemia in chronic kidney disease: Secondary | ICD-10-CM | POA: Diagnosis not present

## 2022-11-17 DIAGNOSIS — Z4932 Encounter for adequacy testing for peritoneal dialysis: Secondary | ICD-10-CM | POA: Diagnosis not present

## 2022-11-17 DIAGNOSIS — N2581 Secondary hyperparathyroidism of renal origin: Secondary | ICD-10-CM | POA: Diagnosis not present

## 2022-11-17 DIAGNOSIS — N186 End stage renal disease: Secondary | ICD-10-CM | POA: Diagnosis not present

## 2022-11-17 DIAGNOSIS — D509 Iron deficiency anemia, unspecified: Secondary | ICD-10-CM | POA: Diagnosis not present

## 2022-11-18 DIAGNOSIS — Z4932 Encounter for adequacy testing for peritoneal dialysis: Secondary | ICD-10-CM | POA: Diagnosis not present

## 2022-11-18 DIAGNOSIS — N2581 Secondary hyperparathyroidism of renal origin: Secondary | ICD-10-CM | POA: Diagnosis not present

## 2022-11-18 DIAGNOSIS — D509 Iron deficiency anemia, unspecified: Secondary | ICD-10-CM | POA: Diagnosis not present

## 2022-11-18 DIAGNOSIS — D631 Anemia in chronic kidney disease: Secondary | ICD-10-CM | POA: Diagnosis not present

## 2022-11-18 DIAGNOSIS — Z992 Dependence on renal dialysis: Secondary | ICD-10-CM | POA: Diagnosis not present

## 2022-11-18 DIAGNOSIS — N186 End stage renal disease: Secondary | ICD-10-CM | POA: Diagnosis not present

## 2022-11-19 DIAGNOSIS — Z4932 Encounter for adequacy testing for peritoneal dialysis: Secondary | ICD-10-CM | POA: Diagnosis not present

## 2022-11-19 DIAGNOSIS — N2581 Secondary hyperparathyroidism of renal origin: Secondary | ICD-10-CM | POA: Diagnosis not present

## 2022-11-19 DIAGNOSIS — N186 End stage renal disease: Secondary | ICD-10-CM | POA: Diagnosis not present

## 2022-11-19 DIAGNOSIS — D509 Iron deficiency anemia, unspecified: Secondary | ICD-10-CM | POA: Diagnosis not present

## 2022-11-19 DIAGNOSIS — D631 Anemia in chronic kidney disease: Secondary | ICD-10-CM | POA: Diagnosis not present

## 2022-11-19 DIAGNOSIS — Z992 Dependence on renal dialysis: Secondary | ICD-10-CM | POA: Diagnosis not present

## 2022-11-20 DIAGNOSIS — D509 Iron deficiency anemia, unspecified: Secondary | ICD-10-CM | POA: Diagnosis not present

## 2022-11-20 DIAGNOSIS — Z4932 Encounter for adequacy testing for peritoneal dialysis: Secondary | ICD-10-CM | POA: Diagnosis not present

## 2022-11-20 DIAGNOSIS — Z992 Dependence on renal dialysis: Secondary | ICD-10-CM | POA: Diagnosis not present

## 2022-11-20 DIAGNOSIS — N186 End stage renal disease: Secondary | ICD-10-CM | POA: Diagnosis not present

## 2022-11-20 DIAGNOSIS — N2581 Secondary hyperparathyroidism of renal origin: Secondary | ICD-10-CM | POA: Diagnosis not present

## 2022-11-20 DIAGNOSIS — D631 Anemia in chronic kidney disease: Secondary | ICD-10-CM | POA: Diagnosis not present

## 2022-11-21 DIAGNOSIS — Z4932 Encounter for adequacy testing for peritoneal dialysis: Secondary | ICD-10-CM | POA: Diagnosis not present

## 2022-11-21 DIAGNOSIS — D631 Anemia in chronic kidney disease: Secondary | ICD-10-CM | POA: Diagnosis not present

## 2022-11-21 DIAGNOSIS — N186 End stage renal disease: Secondary | ICD-10-CM | POA: Diagnosis not present

## 2022-11-21 DIAGNOSIS — N2581 Secondary hyperparathyroidism of renal origin: Secondary | ICD-10-CM | POA: Diagnosis not present

## 2022-11-21 DIAGNOSIS — Z992 Dependence on renal dialysis: Secondary | ICD-10-CM | POA: Diagnosis not present

## 2022-11-21 DIAGNOSIS — D509 Iron deficiency anemia, unspecified: Secondary | ICD-10-CM | POA: Diagnosis not present

## 2022-11-22 DIAGNOSIS — D631 Anemia in chronic kidney disease: Secondary | ICD-10-CM | POA: Diagnosis not present

## 2022-11-22 DIAGNOSIS — Z4932 Encounter for adequacy testing for peritoneal dialysis: Secondary | ICD-10-CM | POA: Diagnosis not present

## 2022-11-22 DIAGNOSIS — D509 Iron deficiency anemia, unspecified: Secondary | ICD-10-CM | POA: Diagnosis not present

## 2022-11-22 DIAGNOSIS — N186 End stage renal disease: Secondary | ICD-10-CM | POA: Diagnosis not present

## 2022-11-22 DIAGNOSIS — Z992 Dependence on renal dialysis: Secondary | ICD-10-CM | POA: Diagnosis not present

## 2022-11-22 DIAGNOSIS — N2581 Secondary hyperparathyroidism of renal origin: Secondary | ICD-10-CM | POA: Diagnosis not present

## 2022-11-23 DIAGNOSIS — Z992 Dependence on renal dialysis: Secondary | ICD-10-CM | POA: Diagnosis not present

## 2022-11-23 DIAGNOSIS — D631 Anemia in chronic kidney disease: Secondary | ICD-10-CM | POA: Diagnosis not present

## 2022-11-23 DIAGNOSIS — D509 Iron deficiency anemia, unspecified: Secondary | ICD-10-CM | POA: Diagnosis not present

## 2022-11-23 DIAGNOSIS — Z4932 Encounter for adequacy testing for peritoneal dialysis: Secondary | ICD-10-CM | POA: Diagnosis not present

## 2022-11-23 DIAGNOSIS — N2581 Secondary hyperparathyroidism of renal origin: Secondary | ICD-10-CM | POA: Diagnosis not present

## 2022-11-23 DIAGNOSIS — N186 End stage renal disease: Secondary | ICD-10-CM | POA: Diagnosis not present

## 2022-11-24 DIAGNOSIS — N2581 Secondary hyperparathyroidism of renal origin: Secondary | ICD-10-CM | POA: Diagnosis not present

## 2022-11-24 DIAGNOSIS — D509 Iron deficiency anemia, unspecified: Secondary | ICD-10-CM | POA: Diagnosis not present

## 2022-11-24 DIAGNOSIS — Z4932 Encounter for adequacy testing for peritoneal dialysis: Secondary | ICD-10-CM | POA: Diagnosis not present

## 2022-11-24 DIAGNOSIS — Z992 Dependence on renal dialysis: Secondary | ICD-10-CM | POA: Diagnosis not present

## 2022-11-24 DIAGNOSIS — D631 Anemia in chronic kidney disease: Secondary | ICD-10-CM | POA: Diagnosis not present

## 2022-11-24 DIAGNOSIS — N186 End stage renal disease: Secondary | ICD-10-CM | POA: Diagnosis not present

## 2022-11-26 DIAGNOSIS — D509 Iron deficiency anemia, unspecified: Secondary | ICD-10-CM | POA: Diagnosis not present

## 2022-11-26 DIAGNOSIS — Z4932 Encounter for adequacy testing for peritoneal dialysis: Secondary | ICD-10-CM | POA: Diagnosis not present

## 2022-11-26 DIAGNOSIS — N186 End stage renal disease: Secondary | ICD-10-CM | POA: Diagnosis not present

## 2022-11-26 DIAGNOSIS — N2581 Secondary hyperparathyroidism of renal origin: Secondary | ICD-10-CM | POA: Diagnosis not present

## 2022-11-26 DIAGNOSIS — Z992 Dependence on renal dialysis: Secondary | ICD-10-CM | POA: Diagnosis not present

## 2022-11-26 DIAGNOSIS — D631 Anemia in chronic kidney disease: Secondary | ICD-10-CM | POA: Diagnosis not present

## 2022-11-27 DIAGNOSIS — Z992 Dependence on renal dialysis: Secondary | ICD-10-CM | POA: Diagnosis not present

## 2022-11-27 DIAGNOSIS — N186 End stage renal disease: Secondary | ICD-10-CM | POA: Diagnosis not present

## 2022-11-27 DIAGNOSIS — D509 Iron deficiency anemia, unspecified: Secondary | ICD-10-CM | POA: Diagnosis not present

## 2022-11-27 DIAGNOSIS — N2581 Secondary hyperparathyroidism of renal origin: Secondary | ICD-10-CM | POA: Diagnosis not present

## 2022-11-27 DIAGNOSIS — Z4932 Encounter for adequacy testing for peritoneal dialysis: Secondary | ICD-10-CM | POA: Diagnosis not present

## 2022-11-27 DIAGNOSIS — D631 Anemia in chronic kidney disease: Secondary | ICD-10-CM | POA: Diagnosis not present

## 2022-11-28 DIAGNOSIS — Z992 Dependence on renal dialysis: Secondary | ICD-10-CM | POA: Diagnosis not present

## 2022-11-28 DIAGNOSIS — D509 Iron deficiency anemia, unspecified: Secondary | ICD-10-CM | POA: Diagnosis not present

## 2022-11-28 DIAGNOSIS — Z4932 Encounter for adequacy testing for peritoneal dialysis: Secondary | ICD-10-CM | POA: Diagnosis not present

## 2022-11-28 DIAGNOSIS — N186 End stage renal disease: Secondary | ICD-10-CM | POA: Diagnosis not present

## 2022-11-28 DIAGNOSIS — N2581 Secondary hyperparathyroidism of renal origin: Secondary | ICD-10-CM | POA: Diagnosis not present

## 2022-11-28 DIAGNOSIS — D631 Anemia in chronic kidney disease: Secondary | ICD-10-CM | POA: Diagnosis not present

## 2022-11-29 DIAGNOSIS — N186 End stage renal disease: Secondary | ICD-10-CM | POA: Diagnosis not present

## 2022-11-29 DIAGNOSIS — Z992 Dependence on renal dialysis: Secondary | ICD-10-CM | POA: Diagnosis not present

## 2022-11-29 DIAGNOSIS — D631 Anemia in chronic kidney disease: Secondary | ICD-10-CM | POA: Diagnosis not present

## 2022-11-29 DIAGNOSIS — N2581 Secondary hyperparathyroidism of renal origin: Secondary | ICD-10-CM | POA: Diagnosis not present

## 2022-11-29 DIAGNOSIS — D509 Iron deficiency anemia, unspecified: Secondary | ICD-10-CM | POA: Diagnosis not present

## 2022-11-29 DIAGNOSIS — Z4932 Encounter for adequacy testing for peritoneal dialysis: Secondary | ICD-10-CM | POA: Diagnosis not present

## 2022-11-30 DIAGNOSIS — Z4932 Encounter for adequacy testing for peritoneal dialysis: Secondary | ICD-10-CM | POA: Diagnosis not present

## 2022-11-30 DIAGNOSIS — N2581 Secondary hyperparathyroidism of renal origin: Secondary | ICD-10-CM | POA: Diagnosis not present

## 2022-11-30 DIAGNOSIS — Z992 Dependence on renal dialysis: Secondary | ICD-10-CM | POA: Diagnosis not present

## 2022-11-30 DIAGNOSIS — D509 Iron deficiency anemia, unspecified: Secondary | ICD-10-CM | POA: Diagnosis not present

## 2022-11-30 DIAGNOSIS — D631 Anemia in chronic kidney disease: Secondary | ICD-10-CM | POA: Diagnosis not present

## 2022-11-30 DIAGNOSIS — N186 End stage renal disease: Secondary | ICD-10-CM | POA: Diagnosis not present

## 2022-12-01 DIAGNOSIS — D509 Iron deficiency anemia, unspecified: Secondary | ICD-10-CM | POA: Diagnosis not present

## 2022-12-01 DIAGNOSIS — N2581 Secondary hyperparathyroidism of renal origin: Secondary | ICD-10-CM | POA: Diagnosis not present

## 2022-12-01 DIAGNOSIS — N186 End stage renal disease: Secondary | ICD-10-CM | POA: Diagnosis not present

## 2022-12-01 DIAGNOSIS — D631 Anemia in chronic kidney disease: Secondary | ICD-10-CM | POA: Diagnosis not present

## 2022-12-01 DIAGNOSIS — Z992 Dependence on renal dialysis: Secondary | ICD-10-CM | POA: Diagnosis not present

## 2022-12-01 DIAGNOSIS — Z4932 Encounter for adequacy testing for peritoneal dialysis: Secondary | ICD-10-CM | POA: Diagnosis not present

## 2022-12-02 DIAGNOSIS — N186 End stage renal disease: Secondary | ICD-10-CM | POA: Diagnosis not present

## 2022-12-02 DIAGNOSIS — D509 Iron deficiency anemia, unspecified: Secondary | ICD-10-CM | POA: Diagnosis not present

## 2022-12-02 DIAGNOSIS — D631 Anemia in chronic kidney disease: Secondary | ICD-10-CM | POA: Diagnosis not present

## 2022-12-02 DIAGNOSIS — N2581 Secondary hyperparathyroidism of renal origin: Secondary | ICD-10-CM | POA: Diagnosis not present

## 2022-12-02 DIAGNOSIS — Z992 Dependence on renal dialysis: Secondary | ICD-10-CM | POA: Diagnosis not present

## 2022-12-02 DIAGNOSIS — Z4932 Encounter for adequacy testing for peritoneal dialysis: Secondary | ICD-10-CM | POA: Diagnosis not present

## 2022-12-03 DIAGNOSIS — D631 Anemia in chronic kidney disease: Secondary | ICD-10-CM | POA: Diagnosis not present

## 2022-12-03 DIAGNOSIS — N186 End stage renal disease: Secondary | ICD-10-CM | POA: Diagnosis not present

## 2022-12-03 DIAGNOSIS — Z4932 Encounter for adequacy testing for peritoneal dialysis: Secondary | ICD-10-CM | POA: Diagnosis not present

## 2022-12-03 DIAGNOSIS — N2581 Secondary hyperparathyroidism of renal origin: Secondary | ICD-10-CM | POA: Diagnosis not present

## 2022-12-03 DIAGNOSIS — Z992 Dependence on renal dialysis: Secondary | ICD-10-CM | POA: Diagnosis not present

## 2022-12-03 DIAGNOSIS — D509 Iron deficiency anemia, unspecified: Secondary | ICD-10-CM | POA: Diagnosis not present

## 2022-12-04 DIAGNOSIS — Z992 Dependence on renal dialysis: Secondary | ICD-10-CM | POA: Diagnosis not present

## 2022-12-04 DIAGNOSIS — N2581 Secondary hyperparathyroidism of renal origin: Secondary | ICD-10-CM | POA: Diagnosis not present

## 2022-12-04 DIAGNOSIS — Z4932 Encounter for adequacy testing for peritoneal dialysis: Secondary | ICD-10-CM | POA: Diagnosis not present

## 2022-12-04 DIAGNOSIS — N186 End stage renal disease: Secondary | ICD-10-CM | POA: Diagnosis not present

## 2022-12-04 DIAGNOSIS — D631 Anemia in chronic kidney disease: Secondary | ICD-10-CM | POA: Diagnosis not present

## 2022-12-04 DIAGNOSIS — D509 Iron deficiency anemia, unspecified: Secondary | ICD-10-CM | POA: Diagnosis not present

## 2022-12-05 DIAGNOSIS — Z992 Dependence on renal dialysis: Secondary | ICD-10-CM | POA: Diagnosis not present

## 2022-12-05 DIAGNOSIS — D631 Anemia in chronic kidney disease: Secondary | ICD-10-CM | POA: Diagnosis not present

## 2022-12-05 DIAGNOSIS — N2581 Secondary hyperparathyroidism of renal origin: Secondary | ICD-10-CM | POA: Diagnosis not present

## 2022-12-05 DIAGNOSIS — D509 Iron deficiency anemia, unspecified: Secondary | ICD-10-CM | POA: Diagnosis not present

## 2022-12-05 DIAGNOSIS — N186 End stage renal disease: Secondary | ICD-10-CM | POA: Diagnosis not present

## 2022-12-05 DIAGNOSIS — Z4932 Encounter for adequacy testing for peritoneal dialysis: Secondary | ICD-10-CM | POA: Diagnosis not present

## 2022-12-06 DIAGNOSIS — Z992 Dependence on renal dialysis: Secondary | ICD-10-CM | POA: Diagnosis not present

## 2022-12-06 DIAGNOSIS — D631 Anemia in chronic kidney disease: Secondary | ICD-10-CM | POA: Diagnosis not present

## 2022-12-06 DIAGNOSIS — D509 Iron deficiency anemia, unspecified: Secondary | ICD-10-CM | POA: Diagnosis not present

## 2022-12-06 DIAGNOSIS — N186 End stage renal disease: Secondary | ICD-10-CM | POA: Diagnosis not present

## 2022-12-06 DIAGNOSIS — N2581 Secondary hyperparathyroidism of renal origin: Secondary | ICD-10-CM | POA: Diagnosis not present

## 2022-12-06 DIAGNOSIS — Z4932 Encounter for adequacy testing for peritoneal dialysis: Secondary | ICD-10-CM | POA: Diagnosis not present

## 2022-12-07 DIAGNOSIS — Z4932 Encounter for adequacy testing for peritoneal dialysis: Secondary | ICD-10-CM | POA: Diagnosis not present

## 2022-12-07 DIAGNOSIS — D631 Anemia in chronic kidney disease: Secondary | ICD-10-CM | POA: Diagnosis not present

## 2022-12-07 DIAGNOSIS — Z992 Dependence on renal dialysis: Secondary | ICD-10-CM | POA: Diagnosis not present

## 2022-12-07 DIAGNOSIS — N186 End stage renal disease: Secondary | ICD-10-CM | POA: Diagnosis not present

## 2022-12-07 DIAGNOSIS — D509 Iron deficiency anemia, unspecified: Secondary | ICD-10-CM | POA: Diagnosis not present

## 2022-12-07 DIAGNOSIS — N2581 Secondary hyperparathyroidism of renal origin: Secondary | ICD-10-CM | POA: Diagnosis not present

## 2022-12-08 DIAGNOSIS — D631 Anemia in chronic kidney disease: Secondary | ICD-10-CM | POA: Diagnosis not present

## 2022-12-08 DIAGNOSIS — Z992 Dependence on renal dialysis: Secondary | ICD-10-CM | POA: Diagnosis not present

## 2022-12-08 DIAGNOSIS — D509 Iron deficiency anemia, unspecified: Secondary | ICD-10-CM | POA: Diagnosis not present

## 2022-12-08 DIAGNOSIS — Z4932 Encounter for adequacy testing for peritoneal dialysis: Secondary | ICD-10-CM | POA: Diagnosis not present

## 2022-12-08 DIAGNOSIS — N2581 Secondary hyperparathyroidism of renal origin: Secondary | ICD-10-CM | POA: Diagnosis not present

## 2022-12-08 DIAGNOSIS — N186 End stage renal disease: Secondary | ICD-10-CM | POA: Diagnosis not present

## 2022-12-09 DIAGNOSIS — Z992 Dependence on renal dialysis: Secondary | ICD-10-CM | POA: Diagnosis not present

## 2022-12-09 DIAGNOSIS — N186 End stage renal disease: Secondary | ICD-10-CM | POA: Diagnosis not present

## 2022-12-09 DIAGNOSIS — D509 Iron deficiency anemia, unspecified: Secondary | ICD-10-CM | POA: Diagnosis not present

## 2022-12-09 DIAGNOSIS — Z4932 Encounter for adequacy testing for peritoneal dialysis: Secondary | ICD-10-CM | POA: Diagnosis not present

## 2022-12-09 DIAGNOSIS — N2581 Secondary hyperparathyroidism of renal origin: Secondary | ICD-10-CM | POA: Diagnosis not present

## 2022-12-09 DIAGNOSIS — D631 Anemia in chronic kidney disease: Secondary | ICD-10-CM | POA: Diagnosis not present

## 2022-12-10 DIAGNOSIS — Z4932 Encounter for adequacy testing for peritoneal dialysis: Secondary | ICD-10-CM | POA: Diagnosis not present

## 2022-12-10 DIAGNOSIS — N2581 Secondary hyperparathyroidism of renal origin: Secondary | ICD-10-CM | POA: Diagnosis not present

## 2022-12-10 DIAGNOSIS — D631 Anemia in chronic kidney disease: Secondary | ICD-10-CM | POA: Diagnosis not present

## 2022-12-10 DIAGNOSIS — D509 Iron deficiency anemia, unspecified: Secondary | ICD-10-CM | POA: Diagnosis not present

## 2022-12-10 DIAGNOSIS — Z992 Dependence on renal dialysis: Secondary | ICD-10-CM | POA: Diagnosis not present

## 2022-12-10 DIAGNOSIS — N186 End stage renal disease: Secondary | ICD-10-CM | POA: Diagnosis not present

## 2022-12-11 DIAGNOSIS — Z4932 Encounter for adequacy testing for peritoneal dialysis: Secondary | ICD-10-CM | POA: Diagnosis not present

## 2022-12-11 DIAGNOSIS — Z992 Dependence on renal dialysis: Secondary | ICD-10-CM | POA: Diagnosis not present

## 2022-12-11 DIAGNOSIS — N186 End stage renal disease: Secondary | ICD-10-CM | POA: Diagnosis not present

## 2022-12-11 DIAGNOSIS — D509 Iron deficiency anemia, unspecified: Secondary | ICD-10-CM | POA: Diagnosis not present

## 2022-12-11 DIAGNOSIS — D631 Anemia in chronic kidney disease: Secondary | ICD-10-CM | POA: Diagnosis not present

## 2022-12-11 DIAGNOSIS — N2581 Secondary hyperparathyroidism of renal origin: Secondary | ICD-10-CM | POA: Diagnosis not present

## 2022-12-12 DIAGNOSIS — Z992 Dependence on renal dialysis: Secondary | ICD-10-CM | POA: Diagnosis not present

## 2022-12-12 DIAGNOSIS — D631 Anemia in chronic kidney disease: Secondary | ICD-10-CM | POA: Diagnosis not present

## 2022-12-12 DIAGNOSIS — I129 Hypertensive chronic kidney disease with stage 1 through stage 4 chronic kidney disease, or unspecified chronic kidney disease: Secondary | ICD-10-CM | POA: Diagnosis not present

## 2022-12-12 DIAGNOSIS — Z4932 Encounter for adequacy testing for peritoneal dialysis: Secondary | ICD-10-CM | POA: Diagnosis not present

## 2022-12-12 DIAGNOSIS — D509 Iron deficiency anemia, unspecified: Secondary | ICD-10-CM | POA: Diagnosis not present

## 2022-12-12 DIAGNOSIS — N2581 Secondary hyperparathyroidism of renal origin: Secondary | ICD-10-CM | POA: Diagnosis not present

## 2022-12-12 DIAGNOSIS — N186 End stage renal disease: Secondary | ICD-10-CM | POA: Diagnosis not present

## 2022-12-13 DIAGNOSIS — Z992 Dependence on renal dialysis: Secondary | ICD-10-CM | POA: Diagnosis not present

## 2022-12-13 DIAGNOSIS — Z4932 Encounter for adequacy testing for peritoneal dialysis: Secondary | ICD-10-CM | POA: Diagnosis not present

## 2022-12-13 DIAGNOSIS — K769 Liver disease, unspecified: Secondary | ICD-10-CM | POA: Diagnosis not present

## 2022-12-13 DIAGNOSIS — D631 Anemia in chronic kidney disease: Secondary | ICD-10-CM | POA: Diagnosis not present

## 2022-12-13 DIAGNOSIS — N2581 Secondary hyperparathyroidism of renal origin: Secondary | ICD-10-CM | POA: Diagnosis not present

## 2022-12-13 DIAGNOSIS — N186 End stage renal disease: Secondary | ICD-10-CM | POA: Diagnosis not present

## 2022-12-13 DIAGNOSIS — D509 Iron deficiency anemia, unspecified: Secondary | ICD-10-CM | POA: Diagnosis not present

## 2022-12-14 DIAGNOSIS — N2581 Secondary hyperparathyroidism of renal origin: Secondary | ICD-10-CM | POA: Diagnosis not present

## 2022-12-14 DIAGNOSIS — N186 End stage renal disease: Secondary | ICD-10-CM | POA: Diagnosis not present

## 2022-12-14 DIAGNOSIS — D631 Anemia in chronic kidney disease: Secondary | ICD-10-CM | POA: Diagnosis not present

## 2022-12-14 DIAGNOSIS — K769 Liver disease, unspecified: Secondary | ICD-10-CM | POA: Diagnosis not present

## 2022-12-14 DIAGNOSIS — D509 Iron deficiency anemia, unspecified: Secondary | ICD-10-CM | POA: Diagnosis not present

## 2022-12-14 DIAGNOSIS — Z992 Dependence on renal dialysis: Secondary | ICD-10-CM | POA: Diagnosis not present

## 2022-12-14 DIAGNOSIS — Z4932 Encounter for adequacy testing for peritoneal dialysis: Secondary | ICD-10-CM | POA: Diagnosis not present

## 2022-12-15 DIAGNOSIS — N186 End stage renal disease: Secondary | ICD-10-CM | POA: Diagnosis not present

## 2022-12-15 DIAGNOSIS — D509 Iron deficiency anemia, unspecified: Secondary | ICD-10-CM | POA: Diagnosis not present

## 2022-12-15 DIAGNOSIS — N2581 Secondary hyperparathyroidism of renal origin: Secondary | ICD-10-CM | POA: Diagnosis not present

## 2022-12-15 DIAGNOSIS — K769 Liver disease, unspecified: Secondary | ICD-10-CM | POA: Diagnosis not present

## 2022-12-15 DIAGNOSIS — D631 Anemia in chronic kidney disease: Secondary | ICD-10-CM | POA: Diagnosis not present

## 2022-12-15 DIAGNOSIS — Z4932 Encounter for adequacy testing for peritoneal dialysis: Secondary | ICD-10-CM | POA: Diagnosis not present

## 2022-12-15 DIAGNOSIS — Z992 Dependence on renal dialysis: Secondary | ICD-10-CM | POA: Diagnosis not present

## 2022-12-16 DIAGNOSIS — Z4932 Encounter for adequacy testing for peritoneal dialysis: Secondary | ICD-10-CM | POA: Diagnosis not present

## 2022-12-16 DIAGNOSIS — D509 Iron deficiency anemia, unspecified: Secondary | ICD-10-CM | POA: Diagnosis not present

## 2022-12-16 DIAGNOSIS — N186 End stage renal disease: Secondary | ICD-10-CM | POA: Diagnosis not present

## 2022-12-16 DIAGNOSIS — N2581 Secondary hyperparathyroidism of renal origin: Secondary | ICD-10-CM | POA: Diagnosis not present

## 2022-12-16 DIAGNOSIS — D631 Anemia in chronic kidney disease: Secondary | ICD-10-CM | POA: Diagnosis not present

## 2022-12-16 DIAGNOSIS — Z992 Dependence on renal dialysis: Secondary | ICD-10-CM | POA: Diagnosis not present

## 2022-12-16 DIAGNOSIS — K769 Liver disease, unspecified: Secondary | ICD-10-CM | POA: Diagnosis not present

## 2022-12-17 DIAGNOSIS — K769 Liver disease, unspecified: Secondary | ICD-10-CM | POA: Diagnosis not present

## 2022-12-17 DIAGNOSIS — N186 End stage renal disease: Secondary | ICD-10-CM | POA: Diagnosis not present

## 2022-12-17 DIAGNOSIS — D509 Iron deficiency anemia, unspecified: Secondary | ICD-10-CM | POA: Diagnosis not present

## 2022-12-17 DIAGNOSIS — Z4932 Encounter for adequacy testing for peritoneal dialysis: Secondary | ICD-10-CM | POA: Diagnosis not present

## 2022-12-17 DIAGNOSIS — N2581 Secondary hyperparathyroidism of renal origin: Secondary | ICD-10-CM | POA: Diagnosis not present

## 2022-12-17 DIAGNOSIS — Z992 Dependence on renal dialysis: Secondary | ICD-10-CM | POA: Diagnosis not present

## 2022-12-17 DIAGNOSIS — D631 Anemia in chronic kidney disease: Secondary | ICD-10-CM | POA: Diagnosis not present

## 2022-12-18 DIAGNOSIS — D631 Anemia in chronic kidney disease: Secondary | ICD-10-CM | POA: Diagnosis not present

## 2022-12-18 DIAGNOSIS — Z4932 Encounter for adequacy testing for peritoneal dialysis: Secondary | ICD-10-CM | POA: Diagnosis not present

## 2022-12-18 DIAGNOSIS — N2581 Secondary hyperparathyroidism of renal origin: Secondary | ICD-10-CM | POA: Diagnosis not present

## 2022-12-18 DIAGNOSIS — K769 Liver disease, unspecified: Secondary | ICD-10-CM | POA: Diagnosis not present

## 2022-12-18 DIAGNOSIS — N186 End stage renal disease: Secondary | ICD-10-CM | POA: Diagnosis not present

## 2022-12-18 DIAGNOSIS — D509 Iron deficiency anemia, unspecified: Secondary | ICD-10-CM | POA: Diagnosis not present

## 2022-12-18 DIAGNOSIS — Z992 Dependence on renal dialysis: Secondary | ICD-10-CM | POA: Diagnosis not present

## 2022-12-19 DIAGNOSIS — N186 End stage renal disease: Secondary | ICD-10-CM | POA: Diagnosis not present

## 2022-12-19 DIAGNOSIS — D631 Anemia in chronic kidney disease: Secondary | ICD-10-CM | POA: Diagnosis not present

## 2022-12-19 DIAGNOSIS — D509 Iron deficiency anemia, unspecified: Secondary | ICD-10-CM | POA: Diagnosis not present

## 2022-12-19 DIAGNOSIS — Z4932 Encounter for adequacy testing for peritoneal dialysis: Secondary | ICD-10-CM | POA: Diagnosis not present

## 2022-12-19 DIAGNOSIS — K769 Liver disease, unspecified: Secondary | ICD-10-CM | POA: Diagnosis not present

## 2022-12-19 DIAGNOSIS — Z992 Dependence on renal dialysis: Secondary | ICD-10-CM | POA: Diagnosis not present

## 2022-12-19 DIAGNOSIS — N2581 Secondary hyperparathyroidism of renal origin: Secondary | ICD-10-CM | POA: Diagnosis not present

## 2022-12-20 DIAGNOSIS — N186 End stage renal disease: Secondary | ICD-10-CM | POA: Diagnosis not present

## 2022-12-20 DIAGNOSIS — N2581 Secondary hyperparathyroidism of renal origin: Secondary | ICD-10-CM | POA: Diagnosis not present

## 2022-12-20 DIAGNOSIS — D509 Iron deficiency anemia, unspecified: Secondary | ICD-10-CM | POA: Diagnosis not present

## 2022-12-20 DIAGNOSIS — K769 Liver disease, unspecified: Secondary | ICD-10-CM | POA: Diagnosis not present

## 2022-12-20 DIAGNOSIS — D631 Anemia in chronic kidney disease: Secondary | ICD-10-CM | POA: Diagnosis not present

## 2022-12-20 DIAGNOSIS — Z4932 Encounter for adequacy testing for peritoneal dialysis: Secondary | ICD-10-CM | POA: Diagnosis not present

## 2022-12-20 DIAGNOSIS — Z992 Dependence on renal dialysis: Secondary | ICD-10-CM | POA: Diagnosis not present

## 2022-12-21 DIAGNOSIS — Z4932 Encounter for adequacy testing for peritoneal dialysis: Secondary | ICD-10-CM | POA: Diagnosis not present

## 2022-12-21 DIAGNOSIS — Z992 Dependence on renal dialysis: Secondary | ICD-10-CM | POA: Diagnosis not present

## 2022-12-21 DIAGNOSIS — N2581 Secondary hyperparathyroidism of renal origin: Secondary | ICD-10-CM | POA: Diagnosis not present

## 2022-12-21 DIAGNOSIS — N186 End stage renal disease: Secondary | ICD-10-CM | POA: Diagnosis not present

## 2022-12-21 DIAGNOSIS — D631 Anemia in chronic kidney disease: Secondary | ICD-10-CM | POA: Diagnosis not present

## 2022-12-21 DIAGNOSIS — K769 Liver disease, unspecified: Secondary | ICD-10-CM | POA: Diagnosis not present

## 2022-12-21 DIAGNOSIS — D509 Iron deficiency anemia, unspecified: Secondary | ICD-10-CM | POA: Diagnosis not present

## 2022-12-22 DIAGNOSIS — N186 End stage renal disease: Secondary | ICD-10-CM | POA: Diagnosis not present

## 2022-12-22 DIAGNOSIS — Z992 Dependence on renal dialysis: Secondary | ICD-10-CM | POA: Diagnosis not present

## 2022-12-22 DIAGNOSIS — Z4932 Encounter for adequacy testing for peritoneal dialysis: Secondary | ICD-10-CM | POA: Diagnosis not present

## 2022-12-22 DIAGNOSIS — D509 Iron deficiency anemia, unspecified: Secondary | ICD-10-CM | POA: Diagnosis not present

## 2022-12-22 DIAGNOSIS — K769 Liver disease, unspecified: Secondary | ICD-10-CM | POA: Diagnosis not present

## 2022-12-22 DIAGNOSIS — N2581 Secondary hyperparathyroidism of renal origin: Secondary | ICD-10-CM | POA: Diagnosis not present

## 2022-12-22 DIAGNOSIS — D631 Anemia in chronic kidney disease: Secondary | ICD-10-CM | POA: Diagnosis not present

## 2022-12-23 DIAGNOSIS — Z992 Dependence on renal dialysis: Secondary | ICD-10-CM | POA: Diagnosis not present

## 2022-12-23 DIAGNOSIS — D631 Anemia in chronic kidney disease: Secondary | ICD-10-CM | POA: Diagnosis not present

## 2022-12-23 DIAGNOSIS — K769 Liver disease, unspecified: Secondary | ICD-10-CM | POA: Diagnosis not present

## 2022-12-23 DIAGNOSIS — Z4932 Encounter for adequacy testing for peritoneal dialysis: Secondary | ICD-10-CM | POA: Diagnosis not present

## 2022-12-23 DIAGNOSIS — N186 End stage renal disease: Secondary | ICD-10-CM | POA: Diagnosis not present

## 2022-12-23 DIAGNOSIS — N2581 Secondary hyperparathyroidism of renal origin: Secondary | ICD-10-CM | POA: Diagnosis not present

## 2022-12-23 DIAGNOSIS — D509 Iron deficiency anemia, unspecified: Secondary | ICD-10-CM | POA: Diagnosis not present

## 2022-12-24 DIAGNOSIS — D631 Anemia in chronic kidney disease: Secondary | ICD-10-CM | POA: Diagnosis not present

## 2022-12-24 DIAGNOSIS — D509 Iron deficiency anemia, unspecified: Secondary | ICD-10-CM | POA: Diagnosis not present

## 2022-12-24 DIAGNOSIS — N2581 Secondary hyperparathyroidism of renal origin: Secondary | ICD-10-CM | POA: Diagnosis not present

## 2022-12-24 DIAGNOSIS — N186 End stage renal disease: Secondary | ICD-10-CM | POA: Diagnosis not present

## 2022-12-24 DIAGNOSIS — Z992 Dependence on renal dialysis: Secondary | ICD-10-CM | POA: Diagnosis not present

## 2022-12-24 DIAGNOSIS — Z4932 Encounter for adequacy testing for peritoneal dialysis: Secondary | ICD-10-CM | POA: Diagnosis not present

## 2022-12-24 DIAGNOSIS — K769 Liver disease, unspecified: Secondary | ICD-10-CM | POA: Diagnosis not present

## 2022-12-25 DIAGNOSIS — K769 Liver disease, unspecified: Secondary | ICD-10-CM | POA: Diagnosis not present

## 2022-12-25 DIAGNOSIS — D509 Iron deficiency anemia, unspecified: Secondary | ICD-10-CM | POA: Diagnosis not present

## 2022-12-25 DIAGNOSIS — D631 Anemia in chronic kidney disease: Secondary | ICD-10-CM | POA: Diagnosis not present

## 2022-12-25 DIAGNOSIS — N2581 Secondary hyperparathyroidism of renal origin: Secondary | ICD-10-CM | POA: Diagnosis not present

## 2022-12-25 DIAGNOSIS — Z992 Dependence on renal dialysis: Secondary | ICD-10-CM | POA: Diagnosis not present

## 2022-12-25 DIAGNOSIS — Z4932 Encounter for adequacy testing for peritoneal dialysis: Secondary | ICD-10-CM | POA: Diagnosis not present

## 2022-12-25 DIAGNOSIS — N186 End stage renal disease: Secondary | ICD-10-CM | POA: Diagnosis not present

## 2022-12-26 DIAGNOSIS — N2581 Secondary hyperparathyroidism of renal origin: Secondary | ICD-10-CM | POA: Diagnosis not present

## 2022-12-26 DIAGNOSIS — Z4932 Encounter for adequacy testing for peritoneal dialysis: Secondary | ICD-10-CM | POA: Diagnosis not present

## 2022-12-26 DIAGNOSIS — K769 Liver disease, unspecified: Secondary | ICD-10-CM | POA: Diagnosis not present

## 2022-12-26 DIAGNOSIS — N186 End stage renal disease: Secondary | ICD-10-CM | POA: Diagnosis not present

## 2022-12-26 DIAGNOSIS — D631 Anemia in chronic kidney disease: Secondary | ICD-10-CM | POA: Diagnosis not present

## 2022-12-26 DIAGNOSIS — Z992 Dependence on renal dialysis: Secondary | ICD-10-CM | POA: Diagnosis not present

## 2022-12-26 DIAGNOSIS — D509 Iron deficiency anemia, unspecified: Secondary | ICD-10-CM | POA: Diagnosis not present

## 2022-12-27 DIAGNOSIS — Z992 Dependence on renal dialysis: Secondary | ICD-10-CM | POA: Diagnosis not present

## 2022-12-27 DIAGNOSIS — N186 End stage renal disease: Secondary | ICD-10-CM | POA: Diagnosis not present

## 2022-12-27 DIAGNOSIS — K769 Liver disease, unspecified: Secondary | ICD-10-CM | POA: Diagnosis not present

## 2022-12-27 DIAGNOSIS — N2581 Secondary hyperparathyroidism of renal origin: Secondary | ICD-10-CM | POA: Diagnosis not present

## 2022-12-27 DIAGNOSIS — Z4932 Encounter for adequacy testing for peritoneal dialysis: Secondary | ICD-10-CM | POA: Diagnosis not present

## 2022-12-27 DIAGNOSIS — D631 Anemia in chronic kidney disease: Secondary | ICD-10-CM | POA: Diagnosis not present

## 2022-12-27 DIAGNOSIS — D509 Iron deficiency anemia, unspecified: Secondary | ICD-10-CM | POA: Diagnosis not present

## 2022-12-28 DIAGNOSIS — Z992 Dependence on renal dialysis: Secondary | ICD-10-CM | POA: Diagnosis not present

## 2022-12-28 DIAGNOSIS — N2581 Secondary hyperparathyroidism of renal origin: Secondary | ICD-10-CM | POA: Diagnosis not present

## 2022-12-28 DIAGNOSIS — Z4932 Encounter for adequacy testing for peritoneal dialysis: Secondary | ICD-10-CM | POA: Diagnosis not present

## 2022-12-28 DIAGNOSIS — D631 Anemia in chronic kidney disease: Secondary | ICD-10-CM | POA: Diagnosis not present

## 2022-12-28 DIAGNOSIS — N186 End stage renal disease: Secondary | ICD-10-CM | POA: Diagnosis not present

## 2022-12-28 DIAGNOSIS — K769 Liver disease, unspecified: Secondary | ICD-10-CM | POA: Diagnosis not present

## 2022-12-28 DIAGNOSIS — D509 Iron deficiency anemia, unspecified: Secondary | ICD-10-CM | POA: Diagnosis not present

## 2022-12-29 DIAGNOSIS — K769 Liver disease, unspecified: Secondary | ICD-10-CM | POA: Diagnosis not present

## 2022-12-29 DIAGNOSIS — Z992 Dependence on renal dialysis: Secondary | ICD-10-CM | POA: Diagnosis not present

## 2022-12-29 DIAGNOSIS — D631 Anemia in chronic kidney disease: Secondary | ICD-10-CM | POA: Diagnosis not present

## 2022-12-29 DIAGNOSIS — Z4932 Encounter for adequacy testing for peritoneal dialysis: Secondary | ICD-10-CM | POA: Diagnosis not present

## 2022-12-29 DIAGNOSIS — D509 Iron deficiency anemia, unspecified: Secondary | ICD-10-CM | POA: Diagnosis not present

## 2022-12-29 DIAGNOSIS — N2581 Secondary hyperparathyroidism of renal origin: Secondary | ICD-10-CM | POA: Diagnosis not present

## 2022-12-29 DIAGNOSIS — N186 End stage renal disease: Secondary | ICD-10-CM | POA: Diagnosis not present

## 2022-12-30 DIAGNOSIS — N2581 Secondary hyperparathyroidism of renal origin: Secondary | ICD-10-CM | POA: Diagnosis not present

## 2022-12-30 DIAGNOSIS — Z4932 Encounter for adequacy testing for peritoneal dialysis: Secondary | ICD-10-CM | POA: Diagnosis not present

## 2022-12-30 DIAGNOSIS — D631 Anemia in chronic kidney disease: Secondary | ICD-10-CM | POA: Diagnosis not present

## 2022-12-30 DIAGNOSIS — Z992 Dependence on renal dialysis: Secondary | ICD-10-CM | POA: Diagnosis not present

## 2022-12-30 DIAGNOSIS — K769 Liver disease, unspecified: Secondary | ICD-10-CM | POA: Diagnosis not present

## 2022-12-30 DIAGNOSIS — D509 Iron deficiency anemia, unspecified: Secondary | ICD-10-CM | POA: Diagnosis not present

## 2022-12-30 DIAGNOSIS — N186 End stage renal disease: Secondary | ICD-10-CM | POA: Diagnosis not present

## 2022-12-31 DIAGNOSIS — D509 Iron deficiency anemia, unspecified: Secondary | ICD-10-CM | POA: Diagnosis not present

## 2022-12-31 DIAGNOSIS — Z4932 Encounter for adequacy testing for peritoneal dialysis: Secondary | ICD-10-CM | POA: Diagnosis not present

## 2022-12-31 DIAGNOSIS — N186 End stage renal disease: Secondary | ICD-10-CM | POA: Diagnosis not present

## 2022-12-31 DIAGNOSIS — N2581 Secondary hyperparathyroidism of renal origin: Secondary | ICD-10-CM | POA: Diagnosis not present

## 2022-12-31 DIAGNOSIS — Z992 Dependence on renal dialysis: Secondary | ICD-10-CM | POA: Diagnosis not present

## 2022-12-31 DIAGNOSIS — D631 Anemia in chronic kidney disease: Secondary | ICD-10-CM | POA: Diagnosis not present

## 2022-12-31 DIAGNOSIS — K769 Liver disease, unspecified: Secondary | ICD-10-CM | POA: Diagnosis not present

## 2023-01-01 DIAGNOSIS — D631 Anemia in chronic kidney disease: Secondary | ICD-10-CM | POA: Diagnosis not present

## 2023-01-01 DIAGNOSIS — D509 Iron deficiency anemia, unspecified: Secondary | ICD-10-CM | POA: Diagnosis not present

## 2023-01-01 DIAGNOSIS — K769 Liver disease, unspecified: Secondary | ICD-10-CM | POA: Diagnosis not present

## 2023-01-01 DIAGNOSIS — N2581 Secondary hyperparathyroidism of renal origin: Secondary | ICD-10-CM | POA: Diagnosis not present

## 2023-01-01 DIAGNOSIS — Z4932 Encounter for adequacy testing for peritoneal dialysis: Secondary | ICD-10-CM | POA: Diagnosis not present

## 2023-01-01 DIAGNOSIS — N186 End stage renal disease: Secondary | ICD-10-CM | POA: Diagnosis not present

## 2023-01-01 DIAGNOSIS — Z992 Dependence on renal dialysis: Secondary | ICD-10-CM | POA: Diagnosis not present

## 2023-01-02 DIAGNOSIS — N186 End stage renal disease: Secondary | ICD-10-CM | POA: Diagnosis not present

## 2023-01-02 DIAGNOSIS — Z992 Dependence on renal dialysis: Secondary | ICD-10-CM | POA: Diagnosis not present

## 2023-01-02 DIAGNOSIS — D631 Anemia in chronic kidney disease: Secondary | ICD-10-CM | POA: Diagnosis not present

## 2023-01-02 DIAGNOSIS — D509 Iron deficiency anemia, unspecified: Secondary | ICD-10-CM | POA: Diagnosis not present

## 2023-01-02 DIAGNOSIS — Z4932 Encounter for adequacy testing for peritoneal dialysis: Secondary | ICD-10-CM | POA: Diagnosis not present

## 2023-01-02 DIAGNOSIS — K769 Liver disease, unspecified: Secondary | ICD-10-CM | POA: Diagnosis not present

## 2023-01-02 DIAGNOSIS — N2581 Secondary hyperparathyroidism of renal origin: Secondary | ICD-10-CM | POA: Diagnosis not present

## 2023-01-03 DIAGNOSIS — D631 Anemia in chronic kidney disease: Secondary | ICD-10-CM | POA: Diagnosis not present

## 2023-01-03 DIAGNOSIS — D509 Iron deficiency anemia, unspecified: Secondary | ICD-10-CM | POA: Diagnosis not present

## 2023-01-03 DIAGNOSIS — Z4932 Encounter for adequacy testing for peritoneal dialysis: Secondary | ICD-10-CM | POA: Diagnosis not present

## 2023-01-03 DIAGNOSIS — N2581 Secondary hyperparathyroidism of renal origin: Secondary | ICD-10-CM | POA: Diagnosis not present

## 2023-01-03 DIAGNOSIS — N186 End stage renal disease: Secondary | ICD-10-CM | POA: Diagnosis not present

## 2023-01-03 DIAGNOSIS — Z992 Dependence on renal dialysis: Secondary | ICD-10-CM | POA: Diagnosis not present

## 2023-01-03 DIAGNOSIS — K769 Liver disease, unspecified: Secondary | ICD-10-CM | POA: Diagnosis not present

## 2023-01-04 DIAGNOSIS — Z992 Dependence on renal dialysis: Secondary | ICD-10-CM | POA: Diagnosis not present

## 2023-01-04 DIAGNOSIS — N186 End stage renal disease: Secondary | ICD-10-CM | POA: Diagnosis not present

## 2023-01-04 DIAGNOSIS — D509 Iron deficiency anemia, unspecified: Secondary | ICD-10-CM | POA: Diagnosis not present

## 2023-01-04 DIAGNOSIS — Z4932 Encounter for adequacy testing for peritoneal dialysis: Secondary | ICD-10-CM | POA: Diagnosis not present

## 2023-01-04 DIAGNOSIS — D631 Anemia in chronic kidney disease: Secondary | ICD-10-CM | POA: Diagnosis not present

## 2023-01-04 DIAGNOSIS — N2581 Secondary hyperparathyroidism of renal origin: Secondary | ICD-10-CM | POA: Diagnosis not present

## 2023-01-04 DIAGNOSIS — K769 Liver disease, unspecified: Secondary | ICD-10-CM | POA: Diagnosis not present

## 2023-01-05 DIAGNOSIS — K769 Liver disease, unspecified: Secondary | ICD-10-CM | POA: Diagnosis not present

## 2023-01-05 DIAGNOSIS — D509 Iron deficiency anemia, unspecified: Secondary | ICD-10-CM | POA: Diagnosis not present

## 2023-01-05 DIAGNOSIS — N186 End stage renal disease: Secondary | ICD-10-CM | POA: Diagnosis not present

## 2023-01-05 DIAGNOSIS — Z992 Dependence on renal dialysis: Secondary | ICD-10-CM | POA: Diagnosis not present

## 2023-01-05 DIAGNOSIS — Z4932 Encounter for adequacy testing for peritoneal dialysis: Secondary | ICD-10-CM | POA: Diagnosis not present

## 2023-01-05 DIAGNOSIS — D631 Anemia in chronic kidney disease: Secondary | ICD-10-CM | POA: Diagnosis not present

## 2023-01-05 DIAGNOSIS — N2581 Secondary hyperparathyroidism of renal origin: Secondary | ICD-10-CM | POA: Diagnosis not present

## 2023-01-06 DIAGNOSIS — N2581 Secondary hyperparathyroidism of renal origin: Secondary | ICD-10-CM | POA: Diagnosis not present

## 2023-01-06 DIAGNOSIS — K769 Liver disease, unspecified: Secondary | ICD-10-CM | POA: Diagnosis not present

## 2023-01-06 DIAGNOSIS — Z4932 Encounter for adequacy testing for peritoneal dialysis: Secondary | ICD-10-CM | POA: Diagnosis not present

## 2023-01-06 DIAGNOSIS — D509 Iron deficiency anemia, unspecified: Secondary | ICD-10-CM | POA: Diagnosis not present

## 2023-01-06 DIAGNOSIS — Z992 Dependence on renal dialysis: Secondary | ICD-10-CM | POA: Diagnosis not present

## 2023-01-06 DIAGNOSIS — N186 End stage renal disease: Secondary | ICD-10-CM | POA: Diagnosis not present

## 2023-01-06 DIAGNOSIS — D631 Anemia in chronic kidney disease: Secondary | ICD-10-CM | POA: Diagnosis not present

## 2023-01-07 DIAGNOSIS — K769 Liver disease, unspecified: Secondary | ICD-10-CM | POA: Diagnosis not present

## 2023-01-07 DIAGNOSIS — D509 Iron deficiency anemia, unspecified: Secondary | ICD-10-CM | POA: Diagnosis not present

## 2023-01-07 DIAGNOSIS — Z4932 Encounter for adequacy testing for peritoneal dialysis: Secondary | ICD-10-CM | POA: Diagnosis not present

## 2023-01-07 DIAGNOSIS — N2581 Secondary hyperparathyroidism of renal origin: Secondary | ICD-10-CM | POA: Diagnosis not present

## 2023-01-07 DIAGNOSIS — N186 End stage renal disease: Secondary | ICD-10-CM | POA: Diagnosis not present

## 2023-01-07 DIAGNOSIS — D631 Anemia in chronic kidney disease: Secondary | ICD-10-CM | POA: Diagnosis not present

## 2023-01-07 DIAGNOSIS — Z992 Dependence on renal dialysis: Secondary | ICD-10-CM | POA: Diagnosis not present

## 2023-01-08 DIAGNOSIS — Z992 Dependence on renal dialysis: Secondary | ICD-10-CM | POA: Diagnosis not present

## 2023-01-08 DIAGNOSIS — D631 Anemia in chronic kidney disease: Secondary | ICD-10-CM | POA: Diagnosis not present

## 2023-01-08 DIAGNOSIS — D509 Iron deficiency anemia, unspecified: Secondary | ICD-10-CM | POA: Diagnosis not present

## 2023-01-08 DIAGNOSIS — N2581 Secondary hyperparathyroidism of renal origin: Secondary | ICD-10-CM | POA: Diagnosis not present

## 2023-01-08 DIAGNOSIS — Z4932 Encounter for adequacy testing for peritoneal dialysis: Secondary | ICD-10-CM | POA: Diagnosis not present

## 2023-01-08 DIAGNOSIS — K769 Liver disease, unspecified: Secondary | ICD-10-CM | POA: Diagnosis not present

## 2023-01-08 DIAGNOSIS — N186 End stage renal disease: Secondary | ICD-10-CM | POA: Diagnosis not present

## 2023-01-09 DIAGNOSIS — Z992 Dependence on renal dialysis: Secondary | ICD-10-CM | POA: Diagnosis not present

## 2023-01-09 DIAGNOSIS — Z4932 Encounter for adequacy testing for peritoneal dialysis: Secondary | ICD-10-CM | POA: Diagnosis not present

## 2023-01-09 DIAGNOSIS — N186 End stage renal disease: Secondary | ICD-10-CM | POA: Diagnosis not present

## 2023-01-09 DIAGNOSIS — N2581 Secondary hyperparathyroidism of renal origin: Secondary | ICD-10-CM | POA: Diagnosis not present

## 2023-01-09 DIAGNOSIS — D631 Anemia in chronic kidney disease: Secondary | ICD-10-CM | POA: Diagnosis not present

## 2023-01-09 DIAGNOSIS — K769 Liver disease, unspecified: Secondary | ICD-10-CM | POA: Diagnosis not present

## 2023-01-09 DIAGNOSIS — D509 Iron deficiency anemia, unspecified: Secondary | ICD-10-CM | POA: Diagnosis not present

## 2023-01-10 DIAGNOSIS — N186 End stage renal disease: Secondary | ICD-10-CM | POA: Diagnosis not present

## 2023-01-10 DIAGNOSIS — N2581 Secondary hyperparathyroidism of renal origin: Secondary | ICD-10-CM | POA: Diagnosis not present

## 2023-01-10 DIAGNOSIS — K769 Liver disease, unspecified: Secondary | ICD-10-CM | POA: Diagnosis not present

## 2023-01-10 DIAGNOSIS — Z4932 Encounter for adequacy testing for peritoneal dialysis: Secondary | ICD-10-CM | POA: Diagnosis not present

## 2023-01-10 DIAGNOSIS — I129 Hypertensive chronic kidney disease with stage 1 through stage 4 chronic kidney disease, or unspecified chronic kidney disease: Secondary | ICD-10-CM | POA: Diagnosis not present

## 2023-01-10 DIAGNOSIS — D631 Anemia in chronic kidney disease: Secondary | ICD-10-CM | POA: Diagnosis not present

## 2023-01-10 DIAGNOSIS — Z992 Dependence on renal dialysis: Secondary | ICD-10-CM | POA: Diagnosis not present

## 2023-01-10 DIAGNOSIS — D509 Iron deficiency anemia, unspecified: Secondary | ICD-10-CM | POA: Diagnosis not present

## 2023-01-11 DIAGNOSIS — D631 Anemia in chronic kidney disease: Secondary | ICD-10-CM | POA: Diagnosis not present

## 2023-01-11 DIAGNOSIS — Z992 Dependence on renal dialysis: Secondary | ICD-10-CM | POA: Diagnosis not present

## 2023-01-11 DIAGNOSIS — Z4932 Encounter for adequacy testing for peritoneal dialysis: Secondary | ICD-10-CM | POA: Diagnosis not present

## 2023-01-11 DIAGNOSIS — N2581 Secondary hyperparathyroidism of renal origin: Secondary | ICD-10-CM | POA: Diagnosis not present

## 2023-01-11 DIAGNOSIS — N186 End stage renal disease: Secondary | ICD-10-CM | POA: Diagnosis not present

## 2023-01-11 DIAGNOSIS — K769 Liver disease, unspecified: Secondary | ICD-10-CM | POA: Diagnosis not present

## 2023-01-12 DIAGNOSIS — N2581 Secondary hyperparathyroidism of renal origin: Secondary | ICD-10-CM | POA: Diagnosis not present

## 2023-01-12 DIAGNOSIS — D631 Anemia in chronic kidney disease: Secondary | ICD-10-CM | POA: Diagnosis not present

## 2023-01-12 DIAGNOSIS — Z992 Dependence on renal dialysis: Secondary | ICD-10-CM | POA: Diagnosis not present

## 2023-01-12 DIAGNOSIS — N186 End stage renal disease: Secondary | ICD-10-CM | POA: Diagnosis not present

## 2023-01-12 DIAGNOSIS — Z4932 Encounter for adequacy testing for peritoneal dialysis: Secondary | ICD-10-CM | POA: Diagnosis not present

## 2023-01-12 DIAGNOSIS — K769 Liver disease, unspecified: Secondary | ICD-10-CM | POA: Diagnosis not present

## 2023-01-13 DIAGNOSIS — D631 Anemia in chronic kidney disease: Secondary | ICD-10-CM | POA: Diagnosis not present

## 2023-01-13 DIAGNOSIS — K769 Liver disease, unspecified: Secondary | ICD-10-CM | POA: Diagnosis not present

## 2023-01-13 DIAGNOSIS — N186 End stage renal disease: Secondary | ICD-10-CM | POA: Diagnosis not present

## 2023-01-13 DIAGNOSIS — N2581 Secondary hyperparathyroidism of renal origin: Secondary | ICD-10-CM | POA: Diagnosis not present

## 2023-01-13 DIAGNOSIS — Z4932 Encounter for adequacy testing for peritoneal dialysis: Secondary | ICD-10-CM | POA: Diagnosis not present

## 2023-01-13 DIAGNOSIS — Z992 Dependence on renal dialysis: Secondary | ICD-10-CM | POA: Diagnosis not present

## 2023-01-14 DIAGNOSIS — Z4932 Encounter for adequacy testing for peritoneal dialysis: Secondary | ICD-10-CM | POA: Diagnosis not present

## 2023-01-14 DIAGNOSIS — N186 End stage renal disease: Secondary | ICD-10-CM | POA: Diagnosis not present

## 2023-01-14 DIAGNOSIS — Z992 Dependence on renal dialysis: Secondary | ICD-10-CM | POA: Diagnosis not present

## 2023-01-14 DIAGNOSIS — N2581 Secondary hyperparathyroidism of renal origin: Secondary | ICD-10-CM | POA: Diagnosis not present

## 2023-01-14 DIAGNOSIS — K769 Liver disease, unspecified: Secondary | ICD-10-CM | POA: Diagnosis not present

## 2023-01-14 DIAGNOSIS — D631 Anemia in chronic kidney disease: Secondary | ICD-10-CM | POA: Diagnosis not present

## 2023-01-15 ENCOUNTER — Emergency Department (HOSPITAL_COMMUNITY): Payer: Medicare Other

## 2023-01-15 ENCOUNTER — Other Ambulatory Visit: Payer: Self-pay

## 2023-01-15 ENCOUNTER — Emergency Department (HOSPITAL_COMMUNITY)
Admission: EM | Admit: 2023-01-15 | Discharge: 2023-01-15 | Disposition: A | Discharge status: Home / Self Care | Payer: Medicare Other | Attending: Emergency Medicine | Admitting: Emergency Medicine

## 2023-01-15 ENCOUNTER — Encounter (HOSPITAL_COMMUNITY): Payer: Self-pay | Admitting: Emergency Medicine

## 2023-01-15 DIAGNOSIS — Z992 Dependence on renal dialysis: Secondary | ICD-10-CM | POA: Insufficient documentation

## 2023-01-15 DIAGNOSIS — W19XXXA Unspecified fall, initial encounter: Secondary | ICD-10-CM | POA: Diagnosis not present

## 2023-01-15 DIAGNOSIS — B974 Respiratory syncytial virus as the cause of diseases classified elsewhere: Secondary | ICD-10-CM | POA: Insufficient documentation

## 2023-01-15 DIAGNOSIS — I10 Essential (primary) hypertension: Secondary | ICD-10-CM | POA: Diagnosis not present

## 2023-01-15 DIAGNOSIS — J9811 Atelectasis: Secondary | ICD-10-CM | POA: Diagnosis not present

## 2023-01-15 DIAGNOSIS — R531 Weakness: Secondary | ICD-10-CM | POA: Diagnosis not present

## 2023-01-15 DIAGNOSIS — T1490XA Injury, unspecified, initial encounter: Secondary | ICD-10-CM | POA: Diagnosis not present

## 2023-01-15 DIAGNOSIS — R509 Fever, unspecified: Secondary | ICD-10-CM | POA: Diagnosis not present

## 2023-01-15 DIAGNOSIS — I499 Cardiac arrhythmia, unspecified: Secondary | ICD-10-CM | POA: Diagnosis not present

## 2023-01-15 DIAGNOSIS — B338 Other specified viral diseases: Secondary | ICD-10-CM

## 2023-01-15 DIAGNOSIS — R059 Cough, unspecified: Secondary | ICD-10-CM | POA: Insufficient documentation

## 2023-01-15 DIAGNOSIS — R040 Epistaxis: Secondary | ICD-10-CM | POA: Diagnosis present

## 2023-01-15 DIAGNOSIS — R58 Hemorrhage, not elsewhere classified: Secondary | ICD-10-CM | POA: Diagnosis not present

## 2023-01-15 DIAGNOSIS — Z1152 Encounter for screening for COVID-19: Secondary | ICD-10-CM | POA: Diagnosis not present

## 2023-01-15 DIAGNOSIS — M19011 Primary osteoarthritis, right shoulder: Secondary | ICD-10-CM | POA: Diagnosis not present

## 2023-01-15 DIAGNOSIS — S0121XA Laceration without foreign body of nose, initial encounter: Secondary | ICD-10-CM

## 2023-01-15 DIAGNOSIS — S61412A Laceration without foreign body of left hand, initial encounter: Secondary | ICD-10-CM | POA: Diagnosis not present

## 2023-01-15 DIAGNOSIS — N189 Chronic kidney disease, unspecified: Secondary | ICD-10-CM | POA: Insufficient documentation

## 2023-01-15 DIAGNOSIS — W01198A Fall on same level from slipping, tripping and stumbling with subsequent striking against other object, initial encounter: Secondary | ICD-10-CM | POA: Diagnosis not present

## 2023-01-15 DIAGNOSIS — S022XXA Fracture of nasal bones, initial encounter for closed fracture: Secondary | ICD-10-CM | POA: Diagnosis not present

## 2023-01-15 DIAGNOSIS — Z743 Need for continuous supervision: Secondary | ICD-10-CM | POA: Diagnosis not present

## 2023-01-15 LAB — CBC WITH DIFFERENTIAL/PLATELET
Abs Immature Granulocytes: 0.06 10*3/uL (ref 0.00–0.07)
Basophils Absolute: 0 10*3/uL (ref 0.0–0.1)
Basophils Relative: 0 %
Eosinophils Absolute: 0 10*3/uL (ref 0.0–0.5)
Eosinophils Relative: 0 %
HCT: 33.8 % — ABNORMAL LOW (ref 39.0–52.0)
Hemoglobin: 11 g/dL — ABNORMAL LOW (ref 13.0–17.0)
Immature Granulocytes: 0 %
Lymphocytes Relative: 4 %
Lymphs Abs: 0.5 10*3/uL — ABNORMAL LOW (ref 0.7–4.0)
MCH: 32 pg (ref 26.0–34.0)
MCHC: 32.5 g/dL (ref 30.0–36.0)
MCV: 98.3 fL (ref 80.0–100.0)
Monocytes Absolute: 0.9 10*3/uL (ref 0.1–1.0)
Monocytes Relative: 7 %
Neutro Abs: 11.9 10*3/uL — ABNORMAL HIGH (ref 1.7–7.7)
Neutrophils Relative %: 89 %
Platelets: 222 10*3/uL (ref 150–400)
RBC: 3.44 MIL/uL — ABNORMAL LOW (ref 4.22–5.81)
RDW: 15 % (ref 11.5–15.5)
WBC: 13.4 10*3/uL — ABNORMAL HIGH (ref 4.0–10.5)
nRBC: 0 % (ref 0.0–0.2)

## 2023-01-15 LAB — COMPREHENSIVE METABOLIC PANEL
ALT: 33 U/L (ref 0–44)
AST: 32 U/L (ref 15–41)
Albumin: 2.8 g/dL — ABNORMAL LOW (ref 3.5–5.0)
Alkaline Phosphatase: 113 U/L (ref 38–126)
Anion gap: 10 (ref 5–15)
BUN: 55 mg/dL — ABNORMAL HIGH (ref 8–23)
CO2: 23 mmol/L (ref 22–32)
Calcium: 8.3 mg/dL — ABNORMAL LOW (ref 8.9–10.3)
Chloride: 102 mmol/L (ref 98–111)
Creatinine, Ser: 10.2 mg/dL — ABNORMAL HIGH (ref 0.61–1.24)
GFR, Estimated: 4 mL/min — ABNORMAL LOW (ref >60)
Glucose, Bld: 118 mg/dL — ABNORMAL HIGH (ref 70–99)
Potassium: 4.5 mmol/L (ref 3.5–5.1)
Sodium: 135 mmol/L (ref 135–145)
Total Bilirubin: 0.5 mg/dL (ref 0.3–1.2)
Total Protein: 5.6 g/dL — ABNORMAL LOW (ref 6.5–8.1)

## 2023-01-15 LAB — RESP PANEL BY RT-PCR (RSV, FLU A&B, COVID)  RVPGX2
Influenza A by PCR: NEGATIVE
Influenza B by PCR: NEGATIVE
Resp Syncytial Virus by PCR: POSITIVE — AB
SARS Coronavirus 2 by RT PCR: NEGATIVE

## 2023-01-15 MED ORDER — SODIUM CHLORIDE 0.9 % IV BOLUS
500.0000 mL | Freq: Once | INTRAVENOUS | Status: AC
  Administered 2023-01-15: 500 mL via INTRAVENOUS

## 2023-01-15 NOTE — ED Notes (Signed)
Fall risk bracelet applied at bridge by ems

## 2023-01-15 NOTE — ED Triage Notes (Signed)
Pt BIB EMS from home after falling while walking to the bathroom. Pt has a laceration to his nose and skin tear to left hand. Pt does peritoneal dialysis at night. Pt also c/o weakness and cough since yesterday.

## 2023-01-15 NOTE — ED Provider Notes (Signed)
[facility] Provider Note   CSN: [id number] Arrival date & time: 01/15/23  1933     History  Chief Complaint  Patient presents with   Shaun Murillo is a 89 y.o. male.  HPI 89 year old male who is on peritoneal dialysis presents after a fall.  He was try to get up to go the bathroom after his son left to go to the store but despite using his walker his legs gave out from underneath him and he fell forward and struck the walker.  He is having some right shoulder pain and lacerated his nose and had a transient nosebleed.  He denies a headache or neck pain.  No chest pain though has been feeling chest congestion and cough for about 3 days.  He has been weaker than normal over these last 3 days though has been chronically weak since starting peritoneal dialysis.  He also has a skin tear to his left hand though that does not hurt. Daughter in law reports he had a low grade fever of 100.5 tonight and was given tylenol.   Home Medications Prior to Admission medications   Medication Sig Start Date End Date Taking? Authorizing Provider  amLODipine (NORVASC) 5 MG tablet Take 1 tablet by mouth daily. 06/01/16   [provider]  aspirin EC 81 MG tablet Take 81 mg by mouth daily as needed for moderate pain.    [provider]  HYDROcodone-acetaminophen (NORCO/VICODIN) 5-325 MG tablet Take 1-2 tablets by mouth every 4 (four) hours as needed for moderate pain. 07/19/16   Melida Quitter, MD  losartan (COZAAR) 100 MG tablet Take 1 tablet by mouth daily. 06/07/16   [provider]  omeprazole (PRILOSEC) 40 MG capsule Take 1 capsule by mouth daily. 06/01/16   [provider]      Allergies    Codeine and Penicillins    Review of Systems   Review of Systems  Constitutional:  Positive for fever.  HENT:  Positive for congestion and nosebleeds.   Respiratory:  Positive for cough. Negative for shortness of breath.    Cardiovascular:  Negative for chest pain.  Gastrointestinal:  Negative for vomiting.  Skin:  Positive for wound.  Neurological:  Positive for weakness. Negative for headaches.    Physical Exam Updated Vital Signs BP (!) 145/86   Pulse 86   Temp 98 F (36.7 C)   Resp 18   Ht '5\' 10"'$  (1.778 m)   Wt 68.5 kg   SpO2 94%   BMI 21.67 kg/m  Physical Exam Vitals and nursing note reviewed.  Constitutional:      Appearance: He is well-developed.  HENT:     Head: Normocephalic.     Nose: Laceration present.     Right Nostril: No epistaxis or septal hematoma.     Left Nostril: No epistaxis or septal hematoma.   Eyes:     Pupils: Pupils are equal, round, and reactive to light.  Cardiovascular:     Rate and Rhythm: Normal rate and regular rhythm.     Heart sounds: Normal heart sounds.  Pulmonary:     Effort: Pulmonary effort is normal.     Breath sounds: Normal breath sounds.  Abdominal:     General: There is no distension.     Palpations: Abdomen is soft.     Tenderness: There is no abdominal tenderness.  Musculoskeletal:     Right shoulder: Tenderness (mild) present. No swelling  or deformity. Normal range of motion.     Comments: Left hand/forearm has skin tears. No tenderness or swelling.  Skin:    General: Skin is warm and dry.  Neurological:     Mental Status: He is alert.     ED Results / Procedures / Treatments   Labs (all labs ordered are listed, but only abnormal results are displayed) Labs Reviewed  RESP PANEL BY RT-PCR (RSV, FLU A&B, COVID)  RVPGX2 - Abnormal; Notable for the following components:      Result Value   Resp Syncytial Virus by PCR POSITIVE (*)    All other components within normal limits  COMPREHENSIVE METABOLIC PANEL - Abnormal; Notable for the following components:   Glucose, Bld 118 (*)    BUN 55 (*)    Creatinine, Ser 10.20 (*)    Calcium 8.3 (*)    Total Protein 5.6 (*)    Albumin 2.8 (*)    GFR, Estimated 4 (*)    All other components  within normal limits  CBC WITH DIFFERENTIAL/PLATELET - Abnormal; Notable for the following components:   WBC 13.4 (*)    RBC 3.44 (*)    Hemoglobin 11.0 (*)    HCT 33.8 (*)    Neutro Abs 11.9 (*)    Lymphs Abs 0.5 (*)    All other components within normal limits    EKG None  Radiology CT Head Wo Contrast  Result Date: 01/15/2023 CLINICAL DATA:  Trauma. EXAM: CT HEAD WITHOUT CONTRAST CT MAXILLOFACIAL WITHOUT CONTRAST CT CERVICAL SPINE WITHOUT CONTRAST TECHNIQUE: Multidetector CT imaging of the head, cervical spine, and maxillofacial structures were performed using the standard protocol without intravenous contrast. Multiplanar CT image reconstructions of the cervical spine and maxillofacial structures were also generated. RADIATION DOSE REDUCTION: This exam was performed according to the departmental dose-optimization program which includes automated exposure control, adjustment of the mA and/or kV according to patient size and/or use of iterative reconstruction technique. COMPARISON:  Head CT dated 06/13/2020. FINDINGS: CT HEAD FINDINGS Brain: Moderate age-related atrophy and chronic microvascular ischemic changes. There is no acute intracranial hemorrhage. No mass effect or midline shift. No extra-axial fluid collection. Vascular: No hyperdense vessel or unexpected calcification. Skull: Normal. Negative for fracture or focal lesion. Other: None CT MAXILLOFACIAL FINDINGS Osseous: There is fracture of the nasal septum with deviation of the nose to the left. Mildly displaced fracture of the right nasal bone. No other acute fracture. No mandibular dislocation. Orbits: The globes and retro-orbital fat are preserved. Sinuses: Partial opacification of the left maxillary sinus and several ethmoid air cells. The mastoid air cells are clear. Soft tissues: Soft tissue swelling over the nose. CT CERVICAL SPINE FINDINGS Alignment: No acute subluxation. There is straightening of normal cervical lordosis which may  be positional or due to muscle spasm. Skull base and vertebrae: No acute fracture.  Osteopenia. Soft tissues and spinal canal: No prevertebral fluid or swelling. No visible canal hematoma. Disc levels:  No acute findings.  Multilevel degenerative changes. Upper chest: Negative. Other: Bilateral carotid bulb calcified plaques. IMPRESSION: 1. No acute intracranial pathology. Moderate age-related atrophy and chronic microvascular ischemic changes. 2. No acute/traumatic cervical spine pathology. 3. Fractures of the right nasal bone and nasal septum. Electronically Signed   By: Anner Crete M.D.   On: 01/15/2023 21:44   CT Maxillofacial Wo Contrast  Result Date: 01/15/2023 CLINICAL DATA:  Trauma. EXAM: CT HEAD WITHOUT CONTRAST CT MAXILLOFACIAL WITHOUT CONTRAST CT CERVICAL SPINE WITHOUT CONTRAST TECHNIQUE: Multidetector CT imaging  of the head, cervical spine, and maxillofacial structures were performed using the standard protocol without intravenous contrast. Multiplanar CT image reconstructions of the cervical spine and maxillofacial structures were also generated. RADIATION DOSE REDUCTION: This exam was performed according to the departmental dose-optimization program which includes automated exposure control, adjustment of the mA and/or kV according to patient size and/or use of iterative reconstruction technique. COMPARISON:  Head CT dated 06/13/2020. FINDINGS: CT HEAD FINDINGS Brain: Moderate age-related atrophy and chronic microvascular ischemic changes. There is no acute intracranial hemorrhage. No mass effect or midline shift. No extra-axial fluid collection. Vascular: No hyperdense vessel or unexpected calcification. Skull: Normal. Negative for fracture or focal lesion. Other: None CT MAXILLOFACIAL FINDINGS Osseous: There is fracture of the nasal septum with deviation of the nose to the left. Mildly displaced fracture of the right nasal bone. No other acute fracture. No mandibular dislocation. Orbits: The  globes and retro-orbital fat are preserved. Sinuses: Partial opacification of the left maxillary sinus and several ethmoid air cells. The mastoid air cells are clear. Soft tissues: Soft tissue swelling over the nose. CT CERVICAL SPINE FINDINGS Alignment: No acute subluxation. There is straightening of normal cervical lordosis which may be positional or due to muscle spasm. Skull base and vertebrae: No acute fracture.  Osteopenia. Soft tissues and spinal canal: No prevertebral fluid or swelling. No visible canal hematoma. Disc levels:  No acute findings.  Multilevel degenerative changes. Upper chest: Negative. Other: Bilateral carotid bulb calcified plaques. IMPRESSION: 1. No acute intracranial pathology. Moderate age-related atrophy and chronic microvascular ischemic changes. 2. No acute/traumatic cervical spine pathology. 3. Fractures of the right nasal bone and nasal septum. Electronically Signed   By: Anner Crete M.D.   On: 01/15/2023 21:44   CT Cervical Spine Wo Contrast  Result Date: 01/15/2023 CLINICAL DATA:  Trauma. EXAM: CT HEAD WITHOUT CONTRAST CT MAXILLOFACIAL WITHOUT CONTRAST CT CERVICAL SPINE WITHOUT CONTRAST TECHNIQUE: Multidetector CT imaging of the head, cervical spine, and maxillofacial structures were performed using the standard protocol without intravenous contrast. Multiplanar CT image reconstructions of the cervical spine and maxillofacial structures were also generated. RADIATION DOSE REDUCTION: This exam was performed according to the departmental dose-optimization program which includes automated exposure control, adjustment of the mA and/or kV according to patient size and/or use of iterative reconstruction technique. COMPARISON:  Head CT dated 06/13/2020. FINDINGS: CT HEAD FINDINGS Brain: Moderate age-related atrophy and chronic microvascular ischemic changes. There is no acute intracranial hemorrhage. No mass effect or midline shift. No extra-axial fluid collection. Vascular: No  hyperdense vessel or unexpected calcification. Skull: Normal. Negative for fracture or focal lesion. Other: None CT MAXILLOFACIAL FINDINGS Osseous: There is fracture of the nasal septum with deviation of the nose to the left. Mildly displaced fracture of the right nasal bone. No other acute fracture. No mandibular dislocation. Orbits: The globes and retro-orbital fat are preserved. Sinuses: Partial opacification of the left maxillary sinus and several ethmoid air cells. The mastoid air cells are clear. Soft tissues: Soft tissue swelling over the nose. CT CERVICAL SPINE FINDINGS Alignment: No acute subluxation. There is straightening of normal cervical lordosis which may be positional or due to muscle spasm. Skull base and vertebrae: No acute fracture.  Osteopenia. Soft tissues and spinal canal: No prevertebral fluid or swelling. No visible canal hematoma. Disc levels:  No acute findings.  Multilevel degenerative changes. Upper chest: Negative. Other: Bilateral carotid bulb calcified plaques. IMPRESSION: 1. No acute intracranial pathology. Moderate age-related atrophy and chronic microvascular ischemic changes. 2. No acute/traumatic  cervical spine pathology. 3. Fractures of the right nasal bone and nasal septum. Electronically Signed   By: Anner Crete M.D.   On: 01/15/2023 21:44   DG Chest 2 View  Result Date: 01/15/2023 CLINICAL DATA:  Cough, fall EXAM: CHEST - 2 VIEW COMPARISON:  06/25/2004 FINDINGS: Low lung volumes. Cardiomegaly, vascular congestion. Bibasilar atelectasis. No effusions or acute bony abnormality. IMPRESSION: Cardiomegaly, vascular congestion. Low volumes with bibasilar atelectasis. Electronically Signed   By: Rolm Baptise M.D.   On: 01/15/2023 21:39   DG Shoulder Right  Result Date: 01/15/2023 CLINICAL DATA:  Cough, fall EXAM: RIGHT SHOULDER - 2+ VIEW COMPARISON:  None Available. FINDINGS: Advanced degenerative changes in the right [facility] joint. Glenohumeral joint intact. No acute bony  abnormality. Specifically, no fracture, subluxation, or dislocation. IMPRESSION: Degenerative changes in the right AC joint. No acute bony abnormality. Electronically Signed   By: Rolm Baptise M.D.   On: 01/15/2023 21:38    Procedures .Marland KitchenLaceration Repair  Date/Time: 01/15/2023 11:23 PM  Performed by: Sherwood Gambler, MD Authorized by: Sherwood Gambler, MD   Consent:    Consent obtained:  Verbal   Consent given by:  Patient Anesthesia:    Anesthesia method:  None Laceration details:    Location:  Face   Face location:  Nose   Length (cm):  3 Pre-procedure details:    Preparation:  Patient was prepped and draped in usual sterile fashion and imaging obtained to evaluate for foreign bodies Exploration:    Limited defect created (wound extended): no     Hemostasis achieved with:  Direct pressure   Imaging outcome: foreign body not noted     Contaminated: no   Treatment:    Area cleansed with:  Saline   Amount of cleaning:  Standard Skin repair:    Repair method:  Tissue adhesive Approximation:    Approximation:  Close Repair type:    Repair type:  Simple Post-procedure details:    Dressing:  Open (no dressing)   Procedure completion:  Tolerated well, no immediate complications     Medications Ordered in ED Medications  sodium chloride 0.9 % bolus 500 mL (0 mLs Intravenous Stopped 01/15/23 2238)    ED Course/ Medical Decision Making/ A&P                             Medical Decision Making Amount and/or Complexity of Data Reviewed Labs: ordered.    Details: Mild leukocytosis.  Known CKD but no significant electrolyte disturbance.  RSV test is positive. Radiology: ordered and independent interpretation performed.    Details: No pneumonia or shoulder fracture.  No head bleed.  Nasal fracture. ECG/medicine tests: ordered and independent interpretation performed.   Patient presents with generalized weakness.  Feeling a lot better currently.  Was given a small bolus of fluid.   I suspect his respiratory symptoms are from the RSV.  No evidence of lobar pneumonia.  I do not think antibiotics or steroids are warranted at this time.  He has known chronic kidney disease and has not done his peritoneal dialysis yet tonight.  However no significant electrolyte disturbance.  The wound on his nose was closed with Dermabond and his wounds on his hand were addressed though these appear to be skin tears that would not do well with sutures.  No fractures noted besides the nasal fracture and he will be referred to ENT.  He otherwise appears stable for discharge and feels comfortable and  well enough for discharge.  He has declined a Tdap.        Final Clinical Impression(s) / ED Diagnoses Final diagnoses:  Fall, initial encounter  RSV infection  Laceration of nose, initial encounter  Closed fracture of nasal bone, initial encounter  Skin tear of left hand without complication, initial encounter    Rx / DC Orders ED Discharge Orders     None         Sherwood Gambler, MD 01/15/23 2324

## 2023-01-16 DIAGNOSIS — N2581 Secondary hyperparathyroidism of renal origin: Secondary | ICD-10-CM | POA: Diagnosis not present

## 2023-01-16 DIAGNOSIS — D631 Anemia in chronic kidney disease: Secondary | ICD-10-CM | POA: Diagnosis not present

## 2023-01-16 DIAGNOSIS — N186 End stage renal disease: Secondary | ICD-10-CM | POA: Diagnosis not present

## 2023-01-16 DIAGNOSIS — Z4932 Encounter for adequacy testing for peritoneal dialysis: Secondary | ICD-10-CM | POA: Diagnosis not present

## 2023-01-16 DIAGNOSIS — K769 Liver disease, unspecified: Secondary | ICD-10-CM | POA: Diagnosis not present

## 2023-01-16 DIAGNOSIS — Z992 Dependence on renal dialysis: Secondary | ICD-10-CM | POA: Diagnosis not present

## 2023-01-17 DIAGNOSIS — Z4932 Encounter for adequacy testing for peritoneal dialysis: Secondary | ICD-10-CM | POA: Diagnosis not present

## 2023-01-17 DIAGNOSIS — K769 Liver disease, unspecified: Secondary | ICD-10-CM | POA: Diagnosis not present

## 2023-01-17 DIAGNOSIS — N2581 Secondary hyperparathyroidism of renal origin: Secondary | ICD-10-CM | POA: Diagnosis not present

## 2023-01-17 DIAGNOSIS — Z992 Dependence on renal dialysis: Secondary | ICD-10-CM | POA: Diagnosis not present

## 2023-01-17 DIAGNOSIS — D631 Anemia in chronic kidney disease: Secondary | ICD-10-CM | POA: Diagnosis not present

## 2023-01-17 DIAGNOSIS — N186 End stage renal disease: Secondary | ICD-10-CM | POA: Diagnosis not present

## 2023-01-18 ENCOUNTER — Other Ambulatory Visit: Payer: Self-pay

## 2023-01-18 ENCOUNTER — Emergency Department (HOSPITAL_COMMUNITY): Payer: Medicare Other

## 2023-01-18 ENCOUNTER — Observation Stay (HOSPITAL_COMMUNITY)
Admission: EM | Admit: 2023-01-18 | Discharge: 2023-01-20 | Disposition: A | Discharge status: Home / Self Care | Payer: Medicare Other | Attending: Internal Medicine | Admitting: Internal Medicine

## 2023-01-18 DIAGNOSIS — I10 Essential (primary) hypertension: Secondary | ICD-10-CM

## 2023-01-18 DIAGNOSIS — R0781 Pleurodynia: Secondary | ICD-10-CM | POA: Diagnosis present

## 2023-01-18 DIAGNOSIS — R6889 Other general symptoms and signs: Secondary | ICD-10-CM | POA: Diagnosis not present

## 2023-01-18 DIAGNOSIS — D631 Anemia in chronic kidney disease: Secondary | ICD-10-CM | POA: Diagnosis not present

## 2023-01-18 DIAGNOSIS — I132 Hypertensive heart and chronic kidney disease with heart failure and with stage 5 chronic kidney disease, or end stage renal disease: Secondary | ICD-10-CM | POA: Insufficient documentation

## 2023-01-18 DIAGNOSIS — Z4932 Encounter for adequacy testing for peritoneal dialysis: Secondary | ICD-10-CM | POA: Diagnosis not present

## 2023-01-18 DIAGNOSIS — R079 Chest pain, unspecified: Secondary | ICD-10-CM | POA: Diagnosis not present

## 2023-01-18 DIAGNOSIS — Z79899 Other long term (current) drug therapy: Secondary | ICD-10-CM | POA: Insufficient documentation

## 2023-01-18 DIAGNOSIS — J9811 Atelectasis: Secondary | ICD-10-CM | POA: Diagnosis not present

## 2023-01-18 DIAGNOSIS — Z992 Dependence on renal dialysis: Secondary | ICD-10-CM | POA: Insufficient documentation

## 2023-01-18 DIAGNOSIS — K769 Liver disease, unspecified: Secondary | ICD-10-CM | POA: Diagnosis not present

## 2023-01-18 DIAGNOSIS — E872 Acidosis, unspecified: Secondary | ICD-10-CM | POA: Insufficient documentation

## 2023-01-18 DIAGNOSIS — I499 Cardiac arrhythmia, unspecified: Secondary | ICD-10-CM | POA: Diagnosis not present

## 2023-01-18 DIAGNOSIS — I509 Heart failure, unspecified: Principal | ICD-10-CM | POA: Insufficient documentation

## 2023-01-18 DIAGNOSIS — N186 End stage renal disease: Secondary | ICD-10-CM | POA: Diagnosis not present

## 2023-01-18 DIAGNOSIS — R059 Cough, unspecified: Secondary | ICD-10-CM | POA: Diagnosis not present

## 2023-01-18 DIAGNOSIS — J811 Chronic pulmonary edema: Secondary | ICD-10-CM | POA: Diagnosis not present

## 2023-01-18 DIAGNOSIS — I110 Hypertensive heart disease with heart failure: Secondary | ICD-10-CM | POA: Diagnosis not present

## 2023-01-18 DIAGNOSIS — N2581 Secondary hyperparathyroidism of renal origin: Secondary | ICD-10-CM | POA: Diagnosis not present

## 2023-01-18 DIAGNOSIS — Z743 Need for continuous supervision: Secondary | ICD-10-CM | POA: Diagnosis not present

## 2023-01-18 LAB — CBC
HCT: 30.5 % — ABNORMAL LOW (ref 39.0–52.0)
Hemoglobin: 10.1 g/dL — ABNORMAL LOW (ref 13.0–17.0)
MCH: 32.2 pg (ref 26.0–34.0)
MCHC: 33.1 g/dL (ref 30.0–36.0)
MCV: 97.1 fL (ref 80.0–100.0)
Platelets: 270 10*3/uL (ref 150–400)
RBC: 3.14 MIL/uL — ABNORMAL LOW (ref 4.22–5.81)
RDW: 14.9 % (ref 11.5–15.5)
WBC: 7.8 10*3/uL (ref 4.0–10.5)
nRBC: 0 % (ref 0.0–0.2)

## 2023-01-18 LAB — BASIC METABOLIC PANEL
Anion gap: 18 — ABNORMAL HIGH (ref 5–15)
BUN: 59 mg/dL — ABNORMAL HIGH (ref 8–23)
CO2: 17 mmol/L — ABNORMAL LOW (ref 22–32)
Calcium: 8.1 mg/dL — ABNORMAL LOW (ref 8.9–10.3)
Chloride: 98 mmol/L (ref 98–111)
Creatinine, Ser: 10.35 mg/dL — ABNORMAL HIGH (ref 0.61–1.24)
GFR, Estimated: 4 mL/min — ABNORMAL LOW (ref >60)
Glucose, Bld: 108 mg/dL — ABNORMAL HIGH (ref 70–99)
Potassium: 4.2 mmol/L (ref 3.5–5.1)
Sodium: 133 mmol/L — ABNORMAL LOW (ref 135–145)

## 2023-01-18 LAB — TROPONIN I (HIGH SENSITIVITY): Troponin I (High Sensitivity): 104 ng/L (ref <18)

## 2023-01-18 LAB — BRAIN NATRIURETIC PEPTIDE: B Natriuretic Peptide: 404.8 pg/mL — ABNORMAL HIGH (ref 0.0–100.0)

## 2023-01-18 MED ORDER — FUROSEMIDE 10 MG/ML IJ SOLN
60.0000 mg | Freq: Once | INTRAMUSCULAR | Status: AC
  Administered 2023-01-18: 60 mg via INTRAVENOUS
  Filled 2023-01-18: qty 6

## 2023-01-18 NOTE — ED Provider Notes (Signed)
[facility] Provider Note   CSN: [id number] Arrival date & time: 01/18/23  1938     History  Chief Complaint  Patient presents with   Chest Pain    Shaun Murillo is a 89 y.o. male.  HPI   89 year old male presents emergency department with shortness of breath, wet cough.  He is very hard of hearing, history obtained from daughter at bedside.  A couple days ago patient had a fall secondary to weakness, is known RSV positive.  Since then has been having worsening cough.  He is now complaining of intermittent chest pain and overall fatigue.  Patient is on peritoneal dialysis, has been compliant.  Lives at home with help of his children.  Home Medications Prior to Admission medications   Medication Sig Start Date End Date Taking? Authorizing Provider  amLODipine (NORVASC) 5 MG tablet Take 1 tablet by mouth daily. 06/01/16   [provider]  aspirin EC 81 MG tablet Take 81 mg by mouth daily as needed for moderate pain.    [provider]  HYDROcodone-acetaminophen (NORCO/VICODIN) 5-325 MG tablet Take 1-2 tablets by mouth every 4 (four) hours as needed for moderate pain. 07/19/16   Melida Quitter, MD  losartan (COZAAR) 100 MG tablet Take 1 tablet by mouth daily. 06/07/16   [provider]  omeprazole (PRILOSEC) 40 MG capsule Take 1 capsule by mouth daily. 06/01/16   [provider]      Allergies    Codeine and Penicillins    Review of Systems   Review of Systems  Constitutional:  Positive for fever.  Respiratory:  Positive for cough and shortness of breath.   Cardiovascular:  Positive for chest pain and leg swelling.  Gastrointestinal:  Negative for abdominal pain, diarrhea and vomiting.  Skin:  Negative for rash.  Neurological:  Negative for headaches.    Physical Exam Updated Vital Signs BP 134/79   Pulse 96   Temp 98.6 F (37 C) (Oral)   Resp (!) 26   SpO2 98%  Physical Exam Vitals and nursing  note reviewed.  Constitutional:      Appearance: Normal appearance. He is ill-appearing. He is not diaphoretic.  HENT:     Head: Normocephalic.     Mouth/Throat:     Mouth: Mucous membranes are moist.  Cardiovascular:     Rate and Rhythm: Normal rate.  Pulmonary:     Effort: Pulmonary effort is normal. No accessory muscle usage or respiratory distress.     Breath sounds: Decreased breath sounds and rales present.  Abdominal:     Palpations: Abdomen is soft.     Tenderness: There is no abdominal tenderness.  Musculoskeletal:     Right lower leg: Edema present.     Left lower leg: Edema present.  Skin:    General: Skin is warm.  Neurological:     Mental Status: He is alert and oriented to person, place, and time. Mental status is at baseline.  Psychiatric:        Mood and Affect: Mood normal.     ED Results / Procedures / Treatments   Labs (all labs ordered are listed, but only abnormal results are displayed) Labs Reviewed  BASIC METABOLIC PANEL - Abnormal; Notable for the following components:      Result Value   Sodium 133 (*)    CO2 17 (*)    Glucose, Bld 108 (*)    BUN 59 (*)  Creatinine, Ser 10.35 (*)    Calcium 8.1 (*)    GFR, Estimated 4 (*)    Anion gap 18 (*)    All other components within normal limits  CBC - Abnormal; Notable for the following components:   RBC 3.14 (*)    Hemoglobin 10.1 (*)    HCT 30.5 (*)    All other components within normal limits  BRAIN NATRIURETIC PEPTIDE - Abnormal; Notable for the following components:   B Natriuretic Peptide 404.8 (*)    All other components within normal limits  TROPONIN I (HIGH SENSITIVITY) - Abnormal; Notable for the following components:   Troponin I (High Sensitivity) 104 (*)    All other components within normal limits  PROCALCITONIN  TROPONIN I (HIGH SENSITIVITY)    EKG EKG Interpretation  Date/Time:  Friday January 18 2023 19:44:45 EST Ventricular Rate:  104 PR Interval:  147 QRS  Duration: 106 QT Interval:  317 QTC Calculation: 417 R Axis:   37 Text Interpretation: Sinus tachycardia Supraventricular bigeminy Confirmed by Lavenia Atlas [id number]) on 01/18/2023 10:37:04 PM  Radiology DG Chest Port 1 View  Result Date: 01/18/2023 CLINICAL DATA:  Congestion, cough, RSV and chest pain EXAM: PORTABLE CHEST 1 VIEW COMPARISON:  03/15/2023 FINDINGS: No focal consolidation, pleural effusion, or pneumothorax. Right basilar atelectasis. Similar cardiomegaly and pulmonary vascular congestion. Aortic atherosclerotic calcification. No displaced rib fractures. IMPRESSION: Cardiomegaly and pulmonary vascular congestion without overt pulmonary edema. Electronically Signed   By: Placido Sou M.D.   On: 01/18/2023 20:16    Procedures .Critical Care  Performed by: Lorelle Gibbs, DO Authorized by: Lorelle Gibbs, DO   Critical care provider statement:    Critical care time (minutes):  30   Critical care was necessary to treat or prevent imminent or life-threatening deterioration of the following conditions:  Cardiac failure   Critical care was time spent personally by me on the following activities:  Development of treatment plan with patient or surrogate, discussions with consultants, evaluation of patient's response to treatment, examination of patient, ordering and review of laboratory studies, ordering and review of radiographic studies, ordering and performing treatments and interventions, pulse oximetry, re-evaluation of patient's condition and review of old charts   I assumed direction of critical care for this patient from another provider in my specialty: no     Care discussed with: admitting provider       Medications Ordered in ED Medications  furosemide (LASIX) injection 60 mg (60 mg Intravenous Given 01/18/23 2251)    ED Course/ Medical Decision Making/ A&P                             Medical Decision Making Amount and/or Complexity of Data Reviewed Labs:  ordered.  Risk Prescription drug management. Decision regarding hospitalization.   89 year old male presents emergency department with shortness of breath, chest pain that is resolved, cough.  Noted to be RSV positive.  Was tachycardic on arrival, normalized on my evaluation.  EKG shows sinus rhythm with PAC.  Patient has a wet cough on exam, no acute respiratory distress.  Mild edema of the lower extremities that the daughter states is more baseline.  Chest x-ray shows cardiomegaly with pulmonary edema.  Blood work shows resolution of leukocytosis, baseline kidney dysfunction.  Elevated troponin as well as elevated BNP.  Concern for new heart failure.  Daughter states he has no known cardiac history/CHF history.  Patient has been compliant with  peritoneal dialysis and his oral furosemide.  Will order an IV dose and admit for new onset CHF.  Patients evaluation and results requires admission for further treatment and care.  Spoke with hospitalist, reviewed patient's ED course and they accept admission.  Patient agrees with admission plan, offers no new complaints and is stable/unchanged at time of admit.        Final Clinical Impression(s) / ED Diagnoses Final diagnoses:  Heart failure, unspecified HF chronicity, unspecified heart failure type [facility])    Rx / DC Orders ED Discharge Orders     None         Lorelle Gibbs, DO 01/18/23 2259

## 2023-01-18 NOTE — ED Triage Notes (Signed)
Pt bib [facility] from home with complaints of lower left sided chest pain that started today. Pt was diagnosed with RSV earlier this week and has had a cough with that. Pt does peritoneal dialysis.  Pt was given 1 nitro tab, '324mg'$  ASA with no relief en route.

## 2023-01-19 ENCOUNTER — Observation Stay (HOSPITAL_BASED_OUTPATIENT_CLINIC_OR_DEPARTMENT_OTHER): Payer: Medicare Other

## 2023-01-19 DIAGNOSIS — I10 Essential (primary) hypertension: Secondary | ICD-10-CM | POA: Diagnosis not present

## 2023-01-19 DIAGNOSIS — Z992 Dependence on renal dialysis: Secondary | ICD-10-CM | POA: Diagnosis not present

## 2023-01-19 DIAGNOSIS — D631 Anemia in chronic kidney disease: Secondary | ICD-10-CM | POA: Diagnosis not present

## 2023-01-19 DIAGNOSIS — R0781 Pleurodynia: Secondary | ICD-10-CM

## 2023-01-19 DIAGNOSIS — N186 End stage renal disease: Secondary | ICD-10-CM

## 2023-01-19 DIAGNOSIS — E872 Acidosis, unspecified: Secondary | ICD-10-CM | POA: Diagnosis not present

## 2023-01-19 DIAGNOSIS — I509 Heart failure, unspecified: Secondary | ICD-10-CM

## 2023-01-19 DIAGNOSIS — I5021 Acute systolic (congestive) heart failure: Secondary | ICD-10-CM | POA: Diagnosis not present

## 2023-01-19 LAB — ECHOCARDIOGRAM COMPLETE
AR max vel: 1.88 cm2
AV Area VTI: 1.88 cm2
AV Area mean vel: 1.79 cm2
AV Mean grad: 15 mmHg
AV Peak grad: 24.5 mmHg
Ao pk vel: 2.48 m/s
Area-P 1/2: 3.77 cm2
S' Lateral: 3.8 cm

## 2023-01-19 LAB — CBC
HCT: 29.4 % — ABNORMAL LOW (ref 39.0–52.0)
Hemoglobin: 9.7 g/dL — ABNORMAL LOW (ref 13.0–17.0)
MCH: 32.3 pg (ref 26.0–34.0)
MCHC: 33 g/dL (ref 30.0–36.0)
MCV: 98 fL (ref 80.0–100.0)
Platelets: 242 10*3/uL (ref 150–400)
RBC: 3 MIL/uL — ABNORMAL LOW (ref 4.22–5.81)
RDW: 14.8 % (ref 11.5–15.5)
WBC: 6.5 10*3/uL (ref 4.0–10.5)
nRBC: 0 % (ref 0.0–0.2)

## 2023-01-19 LAB — BASIC METABOLIC PANEL
Anion gap: 12 (ref 5–15)
BUN: 64 mg/dL — ABNORMAL HIGH (ref 8–23)
CO2: 20 mmol/L — ABNORMAL LOW (ref 22–32)
Calcium: 8.1 mg/dL — ABNORMAL LOW (ref 8.9–10.3)
Chloride: 101 mmol/L (ref 98–111)
Creatinine, Ser: 11.18 mg/dL — ABNORMAL HIGH (ref 0.61–1.24)
GFR, Estimated: 4 mL/min — ABNORMAL LOW (ref >60)
Glucose, Bld: 92 mg/dL (ref 70–99)
Potassium: 4.1 mmol/L (ref 3.5–5.1)
Sodium: 133 mmol/L — ABNORMAL LOW (ref 135–145)

## 2023-01-19 LAB — TROPONIN I (HIGH SENSITIVITY): Troponin I (High Sensitivity): 104 ng/L (ref <18)

## 2023-01-19 LAB — PROCALCITONIN: Procalcitonin: 2.1 ng/mL

## 2023-01-19 MED ORDER — FUROSEMIDE 10 MG/ML IJ SOLN
120.0000 mg | Freq: Three times a day (TID) | INTRAVENOUS | Status: AC
  Administered 2023-01-19 (×2): 120 mg via INTRAVENOUS
  Filled 2023-01-19 (×2): qty 12
  Filled 2023-01-19: qty 10
  Filled 2023-01-19: qty 2

## 2023-01-19 MED ORDER — ALLOPURINOL 100 MG PO TABS: 100.0000 mg | ORAL_TABLET | ORAL | Status: DC

## 2023-01-19 MED ORDER — DELFLEX-LC/2.5% DEXTROSE 394 MOSM/L IP SOLN: INTRAPERITONEAL | Status: DC

## 2023-01-19 MED ORDER — ACETAMINOPHEN 325 MG PO TABS: 650.0000 mg | ORAL_TABLET | Freq: Four times a day (QID) | ORAL | Status: DC | PRN

## 2023-01-19 MED ORDER — HEPARIN SODIUM (PORCINE) 5000 UNIT/ML IJ SOLN
5000.0000 [IU] | Freq: Three times a day (TID) | INTRAMUSCULAR | Status: DC
  Administered 2023-01-19 – 2023-01-20 (×3): 5000 [IU] via SUBCUTANEOUS
  Filled 2023-01-19 (×3): qty 1

## 2023-01-19 MED ORDER — DELFLEX-LC/4.25% DEXTROSE 483 MOSM/L IP SOLN: INTRAPERITONEAL | Status: DC

## 2023-01-19 MED ORDER — ONDANSETRON HCL 4 MG/2ML IJ SOLN
4.0000 mg | Freq: Four times a day (QID) | INTRAMUSCULAR | Status: DC | PRN
  Administered 2023-01-19: 4 mg via INTRAVENOUS
  Filled 2023-01-19: qty 2

## 2023-01-19 MED ORDER — HYDRALAZINE HCL 25 MG PO TABS
25.0000 mg | ORAL_TABLET | Freq: Two times a day (BID) | ORAL | Status: DC
  Administered 2023-01-19 – 2023-01-20 (×4): 25 mg via ORAL
  Filled 2023-01-19 (×4): qty 1

## 2023-01-19 MED ORDER — GENTAMICIN SULFATE 0.1 % EX CREA
1.0000 | TOPICAL_CREAM | Freq: Every day | CUTANEOUS | Status: DC
  Administered 2023-01-19 – 2023-01-20 (×3): 1 via TOPICAL
  Filled 2023-01-19: qty 15

## 2023-01-19 MED ORDER — PANTOPRAZOLE SODIUM 40 MG PO TBEC
40.0000 mg | DELAYED_RELEASE_TABLET | Freq: Every day | ORAL | Status: DC
  Administered 2023-01-19 – 2023-01-20 (×2): 40 mg via ORAL
  Filled 2023-01-19 (×2): qty 1

## 2023-01-19 MED ORDER — CALCITRIOL 0.25 MCG PO CAPS
0.2500 ug | ORAL_CAPSULE | ORAL | Status: DC
  Administered 2023-01-19: 0.25 ug via ORAL
  Filled 2023-01-19 (×2): qty 1

## 2023-01-19 MED ORDER — FERROUS SULFATE 325 (65 FE) MG PO TABS
325.0000 mg | ORAL_TABLET | Freq: Every morning | ORAL | Status: DC
  Administered 2023-01-19 – 2023-01-20 (×2): 325 mg via ORAL
  Filled 2023-01-19 (×2): qty 1

## 2023-01-19 MED ORDER — AMLODIPINE BESYLATE 5 MG PO TABS
5.0000 mg | ORAL_TABLET | Freq: Every day | ORAL | Status: DC
  Administered 2023-01-19 (×2): 5 mg via ORAL
  Filled 2023-01-19 (×2): qty 1

## 2023-01-19 MED ORDER — FERRIC CITRATE 1 GM 210 MG(FE) PO TABS
210.0000 mg | ORAL_TABLET | Freq: Two times a day (BID) | ORAL | Status: DC
  Administered 2023-01-19 – 2023-01-20 (×3): 210 mg via ORAL
  Filled 2023-01-19 (×4): qty 1

## 2023-01-19 MED ORDER — FUROSEMIDE 20 MG PO TABS: 80.0000 mg | ORAL_TABLET | Freq: Every morning | ORAL | Status: DC

## 2023-01-19 MED ORDER — OXYCODONE HCL 5 MG PO TABS
5.0000 mg | ORAL_TABLET | ORAL | Status: DC | PRN
  Administered 2023-01-19: 5 mg via ORAL
  Filled 2023-01-19: qty 1

## 2023-01-19 MED ORDER — BENZONATATE 100 MG PO CAPS
100.0000 mg | ORAL_CAPSULE | Freq: Two times a day (BID) | ORAL | Status: DC | PRN
  Administered 2023-01-19 (×2): 100 mg via ORAL
  Filled 2023-01-19 (×2): qty 1

## 2023-01-19 NOTE — Assessment & Plan Note (Signed)
-  continue home amlodipine, hydralazine -also received IV lasix 60mg  in ED

## 2023-01-19 NOTE — Consult Note (Signed)
[facility] Nurse Consult Note: Reason for Consult:Wound care guidance is requested for care of posterior hand skin tear/laceration sustained during fall Wound type:trauma Pressure Injury POA: NA Wound bed:red Drainage (amount, consistency, odor) serous to serosanguinous Periwound:thin, fragile Dressing procedure/placement/frequency:Guidance is provided for atraumatic and moisture retentive care using a daily NS cleanse followed by gentle pat dry. The lesions(s) are to be covered with folded antimicrobial nonadherent (Xeroform) gauze topped with dry gauze and secured with a few turns of conform bandaging, paper tape.  A sacral silicone foam is to be placed prophylactically and heels floated to prevent pressure injury.  [facility] nursing team will not follow, but will remain available to this patient, the nursing and medical teams.  Please re-consult if needed.  Thank you for inviting [place] to participate in this patient's Plan of Care.  Maudie Flakes, MSN, RN, CNS, [place], Serita Grammes, [employer], [employer] phone:  [phone]

## 2023-01-19 NOTE — ED Notes (Signed)
Pt report received from previous nurse. Pt A&O x1, vitals stable, denies needs/complaints. Call bell in reach. No acute distress noted. Family @ bedside

## 2023-01-19 NOTE — ED Notes (Signed)
ED TO INPATIENT HANDOFF REPORT  ED Nurse Name and Phone #: Vikki Ports [phone]   S Name/Age/Gender Shaun Murillo 89 y.o. male Room/Bed: 045C/045C  Code Status   Code Status: Full Code  Home/SNF/Other Home Patient oriented to: self, place, time, and situation Is this baseline? Yes   Triage Complete: Triage complete  Chief Complaint Acute exacerbation of CHF (congestive heart failure) ([place]) [I50.9]  Triage Note Pt bib [facility] from home with complaints of lower left sided chest pain that started today. Pt was diagnosed with RSV earlier this week and has had a cough with that. Pt does peritoneal dialysis.  Pt was given 1 nitro tab, '324mg'$  ASA with no relief en route.   Allergies Allergies  Allergen Reactions   Codeine Nausea And Vomiting   Penicillins Nausea And Vomiting    Has patient had a PCN reaction causing immediate rash, facial/tongue/throat swelling, SOB or lightheadedness with hypotension: no Has patient had a PCN reaction causing severe rash involving mucus membranes or skin necrosis:no Has patient had a PCN reaction that required hospitalization no Has patient had a PCN reaction occurring within the last 10 years: no If all of the above answers are "NO", then may proceed with Cephalosporin use.    Level of Care/Admitting Diagnosis ED Disposition     ED Disposition  Admit   Condition  --   [place]: [place] [[id number]]  Level of Care: Telemetry Cardiac [103]  May place patient in observation at [facility] or [place] if equivalent level of care is available:: No  Covid Evaluation: Asymptomatic - no recent exposure (last 10 days) testing not required  Diagnosis: Acute exacerbation of CHF (congestive heart failure) [facility]) [id number]  Admitting Physician: Orene Desanctis [id number]  Attending Physician: Orene Desanctis [id number]          B Medical/Surgery History Past Medical History:  Diagnosis Date   Arthritis    Chronic kidney  disease (CKD), stage IV (severe) (HCC)    Complication of anesthesia    Hard of hearing    Hypertension    Kidney stones    PONV (postoperative nausea and vomiting)    Past Surgical History:  Procedure Laterality Date   BACK SURGERY     COLONOSCOPY     ESOPHAGOGASTRODUODENOSCOPY     EYE SURGERY     cataracts bilateral   PAROTIDECTOMY Right 07/18/2016   Procedure: RIGHT SUPERFICIAL PAROTIDECTOMY WITH NERVE DISSECTION;  Surgeon: Melida Quitter, MD;  Location: [facility];  Service: ENT;  Laterality: Right;  RIGHT PAROTIDECTOMY WITH SELECTIVE NECK DISSECTION   SHOULDER SURGERY Right      A IV Location/Drains/Wounds Patient Lines/Drains/Airways Status     Active Line/Drains/Airways     Name Placement date Placement time Site Days   Peripheral IV 01/18/23 18 G Anterior;Distal;Left;Upper Arm 01/18/23  1949  Arm  1   Closed System Drain 1 Right Neck Bulb (JP) 7 Fr. 07/18/16  1154  Neck  2376            Intake/Output Last 24 hours  Intake/Output Summary (Last 24 hours) at 01/19/2023 1234 Last data filed at 01/19/2023 1142 Gross per 24 hour  Intake 230 ml  Output 300 ml  Net -70 ml    Labs/Imaging Results for orders placed or performed during the hospital encounter of 01/18/23 (from the past 48 hour(s))  Basic metabolic panel     Status: Abnormal   Collection Time: 01/18/23  7:53 PM  Result Value  Ref Range   Sodium 133 (L) 135 - 145 mmol/L   Potassium 4.2 3.5 - 5.1 mmol/L   Chloride 98 98 - 111 mmol/L   CO2 17 (L) 22 - 32 mmol/L   Glucose, Bld 108 (H) 70 - 99 mg/dL    Comment: Glucose reference range applies only to samples taken after fasting for at least 8 hours.   BUN 59 (H) 8 - 23 mg/dL   Creatinine, Ser 10.35 (H) 0.61 - 1.24 mg/dL   Calcium 8.1 (L) 8.9 - 10.3 mg/dL   GFR, Estimated 4 (L) >60 mL/min    Comment: (NOTE) Calculated using the CKD-EPI Creatinine Equation (2021)    Anion gap 18 (H) 5 - 15    Comment: Performed at [facility] [address]  [place], [place] [address]  CBC     Status: Abnormal   Collection Time: 01/18/23  7:53 PM  Result Value Ref Range   WBC 7.8 4.0 - 10.5 K/uL   RBC 3.14 (L) 4.22 - 5.81 MIL/uL   Hemoglobin 10.1 (L) 13.0 - 17.0 g/dL   HCT 30.5 (L) 39.0 - 52.0 %   MCV 97.1 80.0 - 100.0 fL   MCH 32.2 26.0 - 34.0 pg   MCHC 33.1 30.0 - 36.0 g/dL   RDW 14.9 11.5 - 15.5 %   Platelets 270 150 - 400 K/uL   nRBC 0.0 0.0 - 0.2 %    Comment: Performed at [facility], [place] [address], [place], [place] [address]  Troponin I (High Sensitivity)     Status: Abnormal   Collection Time: 01/18/23  7:53 PM  Result Value Ref Range   Troponin I (High Sensitivity) 104 (HH) <18 ng/L    Comment: ATTEMPTED CALL 2110 ATTEMPTED CALL 2120 CRITICAL RESULT CALLED TO, READ BACK BY AND VERIFIED WITH C.CHRISCO,RN. 2129 01/18/23. LPAIT (NOTE) Elevated high sensitivity troponin I (hsTnI) values and significant  changes across serial measurements may suggest ACS but many other  chronic and acute conditions are known to elevate hsTnI results.  Refer to the "Links" section for chest pain algorithms and additional  guidance. Performed at [facility], [place] [address] [place], [place] [address]   Brain natriuretic peptide     Status: Abnormal   Collection Time: 01/18/23  7:53 PM  Result Value Ref Range   B Natriuretic Peptide 404.8 (H) 0.0 - 100.0 pg/mL    Comment: Performed at [facility] [address] [place], [place] [address]  Troponin I (High Sensitivity)     Status: Abnormal   Collection Time: 01/18/23 11:14 PM  Result Value Ref Range   Troponin I (High Sensitivity) 104 (HH) <18 ng/L    Comment: CRITICAL VALUE NOTED. VALUE IS CONSISTENT WITH PREVIOUSLY REPORTED/CALLED VALUE (NOTE) Elevated high sensitivity troponin I (hsTnI) values and significant  changes across serial measurements may suggest ACS but many other  chronic and acute conditions are known to elevate hsTnI results.  Refer to the "Links" section for  chest pain algorithms and additional  guidance. Performed at [facility], [place] [address] [place], [place] [address]   Procalcitonin     Status: None   Collection Time: 01/18/23 11:14 PM  Result Value Ref Range   Procalcitonin 2.10 ng/mL    Comment:        Interpretation: PCT > 2 ng/mL: Systemic infection (sepsis) is likely, unless other causes are known. (NOTE)       Sepsis PCT Algorithm  Lower Respiratory Tract                                      Infection PCT Algorithm    ----------------------------     ----------------------------         PCT < 0.25 ng/mL                PCT < 0.10 ng/mL          Strongly encourage             Strongly discourage   discontinuation of antibiotics    initiation of antibiotics    ----------------------------     -----------------------------       PCT 0.25 - 0.50 ng/mL            PCT 0.10 - 0.25 ng/mL               OR       >80% decrease in PCT            Discourage initiation of                                            antibiotics      Encourage discontinuation           of antibiotics    ----------------------------     -----------------------------         PCT >= 0.50 ng/mL              PCT 0.26 - 0.50 ng/mL               AND       <80% decrease in PCT              Encourage initiation of                                             antibiotics       Encourage continuation           of antibiotics    ----------------------------     -----------------------------        PCT >= 0.50 ng/mL                  PCT > 0.50 ng/mL               AND         increase in PCT                  Strongly encourage                                      initiation of antibiotics    Strongly encourage escalation           of antibiotics                                     -----------------------------  PCT <= 0.25 ng/mL                                                 OR                                         > 80% decrease in PCT                                      Discontinue / Do not initiate                                             antibiotics  Performed at [facility], [place] [address] [place], [place] [id number]   Basic metabolic panel     Status: Abnormal   Collection Time: 01/19/23  2:57 AM  Result Value Ref Range   Sodium 133 (L) 135 - 145 mmol/L   Potassium 4.1 3.5 - 5.1 mmol/L   Chloride 101 98 - 111 mmol/L   CO2 20 (L) 22 - 32 mmol/L   Glucose, Bld 92 70 - 99 mg/dL    Comment: Glucose reference range applies only to samples taken after fasting for at least 8 hours.   BUN 64 (H) 8 - 23 mg/dL   Creatinine, Ser 11.18 (H) 0.61 - 1.24 mg/dL   Calcium 8.1 (L) 8.9 - 10.3 mg/dL   GFR, Estimated 4 (L) >60 mL/min    Comment: (NOTE) Calculated using the CKD-EPI Creatinine Equation (2021)    Anion gap 12 5 - 15    Comment: Performed at [facility] [address] [place], [place] [address]  CBC     Status: Abnormal   Collection Time: 01/19/23  2:57 AM  Result Value Ref Range   WBC 6.5 4.0 - 10.5 K/uL   RBC 3.00 (L) 4.22 - 5.81 MIL/uL   Hemoglobin 9.7 (L) 13.0 - 17.0 g/dL   HCT 29.4 (L) 39.0 - 52.0 %   MCV 98.0 80.0 - 100.0 fL   MCH 32.3 26.0 - 34.0 pg   MCHC 33.0 30.0 - 36.0 g/dL   RDW 14.8 11.5 - 15.5 %   Platelets 242 150 - 400 K/uL   nRBC 0.0 0.0 - 0.2 %    Comment: Performed at [facility], [place] [address], [place], [place] [address]   DG Chest Port 1 View  Result Date: 01/18/2023 CLINICAL DATA:  Congestion, cough, RSV and chest pain EXAM: PORTABLE CHEST 1 VIEW COMPARISON:  03/15/2023 FINDINGS: No focal consolidation, pleural effusion, or pneumothorax. Right basilar atelectasis. Similar cardiomegaly and pulmonary vascular congestion. Aortic atherosclerotic calcification. No displaced rib fractures. IMPRESSION: Cardiomegaly and pulmonary vascular congestion without overt pulmonary edema. Electronically Signed   By: Placido Sou M.D.   On:  01/18/2023 20:16    Pending Labs Unresulted Labs (From admission, onward)    None       Vitals/Pain Today's Vitals   01/19/23 0630 01/19/23 0655 01/19/23 0900 01/19/23 0935  BP: 134/74  (!) 141/75   Pulse: 79  89   Resp: 18  (!) 23   Temp:    97.6 F (36.4 C)  TempSrc:    Oral  SpO2: 97%  96%   PainSc:  0-No pain      Isolation Precautions No active isolations  Medications Medications  heparin injection 5,000 Units (has no administration in time range)  allopurinol (ZYLOPRIM) tablet 100 mg (has no administration in time range)  amLODipine (NORVASC) tablet 5 mg (5 mg Oral Given 01/19/23 0128)  hydrALAZINE (APRESOLINE) tablet 25 mg (25 mg Oral Given 01/19/23 1015)  calcitRIOL (ROCALTROL) capsule 0.25 mcg (has no administration in time range)  ferric citrate (AURYXIA) tablet 210 mg (210 mg Oral Given 01/19/23 1015)  pantoprazole (PROTONIX) EC tablet 40 mg (40 mg Oral Given 01/19/23 1015)  ferrous sulfate tablet 325 mg (325 mg Oral Given 01/19/23 0653)  benzonatate (TESSALON) capsule 100 mg (100 mg Oral Given 01/19/23 0315)  furosemide (LASIX) 120 mg in dextrose 5 % 50 mL IVPB (0 mg Intravenous Stopped 01/19/23 1142)  gentamicin cream (GARAMYCIN) 0.1 % 1 Application (has no administration in time range)  dialysis solution 2.5% low-MG/low-CA dianeal solution (has no administration in time range)  dialysis solution 4.25% low-MG/low-CA dianeal solution (has no administration in time range)  furosemide (LASIX) injection 60 mg (60 mg Intravenous Given 01/18/23 2251)    Mobility walks     Focused Assessments Renal Assessment Handoff:  Hemodialysis Schedule: Hemodialysis Schedule: Monday/Wednesday/Friday Peri dialysis at night, next treatment tonight Last Hemodialysis date and time: 01/17/23 Restricted appendage: N/A   R Recommendations: See Admitting Provider Note  Report given to:   Additional Notes: RSV Positive

## 2023-01-19 NOTE — ED Notes (Signed)
Pt transported to vascular, will head to IP floor afterwards. 6E notified.

## 2023-01-19 NOTE — Assessment & Plan Note (Signed)
-  on peritoneal dialysis with last session on 3/7 -needs to consult nephrology in the morning for dialysis. No urgent need overnight.

## 2023-01-19 NOTE — Assessment & Plan Note (Signed)
Hgb stable at 10.1 -continue home ferrous supplement

## 2023-01-19 NOTE — Consult Note (Signed)
Renal Service Consult Note [facility]  Shaun Murillo 01/19/2023 Shaun Blazing, MD Requesting Physician: Dr. Avon Gully  Reason for Consult: ESRD pt on PD with chest pain HPI: The patient is a 89 y.o. year-old w/ PMH as below who presented to ED overnight last night w/ c/o SOB, chest pains. Had a fall on 3/5 attributed to his RSV infection. Missed PD on 3/5 due to being in ED, otherwise has not missed. No c/o LE edema. Takes lasix 80 po qd as he still voids. In ED afeb, HR 108 BP 170/110, WBC wnl, Hb 10.  K 4.2  creat 10.3, bnp 404, trop 104 x 2. CXR showed CM and vasc congestion. Pt rec'd IV lasix '60mg'$  in ED and then was admitted. We are asked to see for dialysis.   Pt seen in ED room. Main c/o is nagging cough. Son is at the bedside. No CP or SOB at this time. Has not had any abd pain or cloudy fluid or machine difficulties w/ the PD.    ROS - denies CP, no joint pain, no HA, no blurry vision, no rash, no diarrhea, no nausea/ vomiting   Past Medical History  Past Medical History:  Diagnosis Date   Arthritis    Chronic kidney disease (CKD), stage IV (severe) (HCC)    Complication of anesthesia    Hard of hearing    Hypertension    Kidney stones    PONV (postoperative nausea and vomiting)    Past Surgical History  Past Surgical History:  Procedure Laterality Date   BACK SURGERY     COLONOSCOPY     ESOPHAGOGASTRODUODENOSCOPY     EYE SURGERY     cataracts bilateral   PAROTIDECTOMY Right 07/18/2016   Procedure: RIGHT SUPERFICIAL PAROTIDECTOMY WITH NERVE DISSECTION;  Surgeon: Melida Quitter, MD;  Location: [place];  Service: ENT;  Laterality: Right;  RIGHT PAROTIDECTOMY WITH SELECTIVE NECK DISSECTION   SHOULDER SURGERY Right    Family History No family history on file. Social History  reports that he has never smoked. He has never used smokeless tobacco. He reports that he does not drink alcohol and does not use drugs. Allergies  Allergies  Allergen Reactions    Codeine Nausea And Vomiting   Penicillins Nausea And Vomiting    Has patient had a PCN reaction causing immediate rash, facial/tongue/throat swelling, SOB or lightheadedness with hypotension: no Has patient had a PCN reaction causing severe rash involving mucus membranes or skin necrosis:no Has patient had a PCN reaction that required hospitalization no Has patient had a PCN reaction occurring within the last 10 years: no If all of the above answers are "NO", then may proceed with Cephalosporin use.   Home medications Prior to Admission medications   Medication Sig Start Date End Date Taking? Authorizing Provider  allopurinol (ZYLOPRIM) 100 MG tablet Take 100 mg by mouth every Monday, Wednesday, and Friday. Takes in the evening 05/23/20  Yes [provider]  amLODipine (NORVASC) 5 MG tablet Take 5 mg by mouth at bedtime. 06/01/16  Yes [provider]  AURYXIA 1 GM 210 MG(Fe) tablet Take 210 mg by mouth 2 (two) times daily with a meal. 12/19/22  Yes [provider]  calcitRIOL (ROCALTROL) 0.25 MCG capsule Take 0.25 mcg by mouth every Tuesday, Thursday, and Saturday at 6 PM. In the evening 04/15/20  Yes [provider]  ferrous sulfate 325 (65 FE) MG tablet Take 325 mg by mouth in the morning. 06/07/20  Yes  [provider]  furosemide (LASIX) 40 MG tablet Take 80 mg by mouth every morning. 05/01/20  Yes [provider]  hydrALAZINE (APRESOLINE) 25 MG tablet Take 25 mg by mouth in the morning and at bedtime. 10/19/22  Yes [provider]  omeprazole (PRILOSEC) 40 MG capsule Take 40 mg by mouth every morning. 06/01/16  Yes [provider]  HYDROcodone-acetaminophen (NORCO/VICODIN) 5-325 MG tablet Take 1-2 tablets by mouth every 4 (four) hours as needed for moderate pain. Patient not taking: Reported on 01/18/2023 07/19/16   Melida Quitter, MD     Vitals:   01/19/23 0400 01/19/23 0630 01/19/23 0900 01/19/23 0935  BP: 138/74 134/74 (!)  141/75   Pulse: 70 79 89   Resp: (!) 22 18 (!) 23   Temp: 98.6 F (37 C)   97.6 F (36.4 C)  TempSrc: Oral   Oral  SpO2: 91% 97% 96%    Exam Gen alert, elderly WM w/ eschar on the face/ nose No rash, cyanosis or gangrene Sclera anicteric, throat clear  No jvd or bruits Chest scattered upper airway noises, no rales RRR no RG Abd soft ntnd no mass or ascites +bs PD cath in place mid-abd GU normal male MS no joint effusions or deformity Ext 1+ bilat UE edema, no other edema  Neuro is alert, Ox 3 , nf    Home meds include - zyloprim, norvasc 5, auryxia 1 ac tid, rocaltrol 0.25 mcg q TTS, lasix 80 qam, hydralazine 25 bid, prilosec, norco prn    BP's 130- 150/ 70-85  HR 85  RR 20-27   afeb    [place] 98%on 2L    Na 133  K 4.1  Cr 11.18    OP CPPD: 7d/ week, 3 exchanges overnight, 2000 ml fill, 1.5h dwell, total time 8 hrs, dry wt 64kg     CXR 3/08 - CM, mild congestion, no edema  Na 133  k 4.1 CO2 20  BUN 64  creat 11.18  Ca 8.1  WBC 6.5K  Hb 9.7   Assessment/ Plan: SOB / cough - w/ +RSV and CXR showing vasc congestion. Admitted for new onset "CHF". Seen in ED, pt is not in distress, lots of coughing. Not grossly vol overloaded.  RSV infection - dx'd earlier this week  ESRD - on PD nightly at home. Have asked pt placement to prioritize him for a room so he can get PD tonight. Will do 4 exchanges tonight instead of 3 to get more solute and fluid off.  HTN - BP's are good, cont home meds Volume - needs a weight when gets to a room. Mild congestion on CXR, mild edema on exam, no resp issues. Will use mix of 2.5% and 4.25% w/ PD tonight to attempt to get vol down.  Anemia esrd - Hb 10- 11 here, no esa needs. Follow.  MBD ckd - CCa in range, will add on phos. Cont auryxia 1 ac tid as binder, and po vdra.      Kelly Splinter  MD [facility] 01/19/2023, 9:43 AM  Recent Labs  Lab 01/15/23 2046 01/18/23 1953 01/19/23 0257  HGB 11.0* 10.1* 9.7*  ALBUMIN 2.8*  --   --   CALCIUM 8.3* 8.1* 8.1*   CREATININE 10.20* 10.35* 11.18*  K 4.5 4.2 4.1   Inpatient medications:  [START ON 01/21/2023] allopurinol  100 mg Oral Q M,W,F   amLODipine  5 mg Oral QHS   calcitRIOL  0.25 mcg Oral Q T,Th,Sat-1800   ferric  citrate  210 mg Oral BID WC   ferrous sulfate  325 mg Oral q AM   heparin  5,000 Units Subcutaneous Q8H   hydrALAZINE  25 mg Oral BID   pantoprazole  40 mg Oral Daily    furosemide     benzonatate

## 2023-01-19 NOTE — ED Notes (Signed)
Pt ambulated to bathroom with minimal assist, urinated but no BM

## 2023-01-19 NOTE — ED Notes (Signed)
Pt medicated for cough, denies any other needs/complaints. Call bell in reach. No acute distress noted. Family @ bedside

## 2023-01-19 NOTE — Assessment & Plan Note (Signed)
-  suspect could be new onset CHF with increasing vascular congestion seen on CXR -obtain echocardiogram  -has received IV lasix 60mg  in the ED -Follow intake and output, daily weight  -will resume home oral 80mg  Lasix tomorrow and volume control with UF with dialysis tomorrow per nephrology

## 2023-01-19 NOTE — ED Notes (Signed)
Pt resting in bed, rise/fall of chest noted. No acute distress noted. Family @ bedside. Call bell in reach, bed low & locked and environment safe.

## 2023-01-19 NOTE — H&P (Signed)
History and Physical    Patient: Shaun Murillo [id number] DOB: 05/07/1932 DOA: 01/18/2023 DOS: the patient was seen and examined on 01/19/2023 PCP: Alroy Dust, L.Marlou Sa, MD  Patient coming from: Home  Chief Complaint:  Chief Complaint  Patient presents with   Chest Pain   HPI: Shaun Murillo is a 89 y.o. male with medical history significant of ESRD on peritoneal dialysis,  HTN, right parotid mass s/p resection (2017)  Who presents with left sided chest pain.   Pt provides limited history as he has hearing impairment. Daughter at bedside helps to collaborate some history. Pt had acute onset left sided chest pain that is constant and associated with shortness of breath and upper apnea. He had a fall due to weakness from RSV on 3/5 with fractured nasal bone and septum. There is small ecchymosis on left chest but no pain on palpation of the chest wall.  He missed peritoneal dialysis on 3/5 since he was in ED but has continued it nightly since. Last session was last evening (3/7). Has chronic LE edema that is no worse. Continues to have urine output and takes '80mg'$  Lasix daily. Daughter feels he tends to stay on dryer side with his dialysis due to lack of oral intake as well.   In the ED, he was afebrile, mildly tachycardic with heart rate of 108, BP up to 170/109 on room air.  No leukocytosis, hemoglobin of 10.1.  Mild hyponatremia of 133, K of 4.2, CO2 of 17, creatinine of 10.35 with anion gap of 18.  Troponin is flat at 104 x 2.  BNP of 404.  Chest x-ray with cardiomegaly and increased vascular congestion compared to chest x-ray on 3/5 on my review.  Patient was given IV 60 mg Lasix in the ED.  Hospitalist then consulted for further admission.  Review of Systems: As mentioned in the history of present illness. All other systems reviewed and are negative. Past Medical History:  Diagnosis Date   Arthritis    Chronic kidney disease (CKD), stage IV (severe) (HCC)    Complication of  anesthesia    Hard of hearing    Hypertension    Kidney stones    PONV (postoperative nausea and vomiting)    Past Surgical History:  Procedure Laterality Date   BACK SURGERY     COLONOSCOPY     ESOPHAGOGASTRODUODENOSCOPY     EYE SURGERY     cataracts bilateral   PAROTIDECTOMY Right 07/18/2016   Procedure: RIGHT SUPERFICIAL PAROTIDECTOMY WITH NERVE DISSECTION;  Surgeon: Melida Quitter, MD;  Location: [facility];  Service: ENT;  Laterality: Right;  RIGHT PAROTIDECTOMY WITH SELECTIVE NECK DISSECTION   SHOULDER SURGERY Right    Social History:  reports that he has never smoked. He has never used smokeless tobacco. He reports that he does not drink alcohol and does not use drugs.  Allergies  Allergen Reactions   Codeine Nausea And Vomiting   Penicillins Nausea And Vomiting    Has patient had a PCN reaction causing immediate rash, facial/tongue/throat swelling, SOB or lightheadedness with hypotension: no Has patient had a PCN reaction causing severe rash involving mucus membranes or skin necrosis:no Has patient had a PCN reaction that required hospitalization no Has patient had a PCN reaction occurring within the last 10 years: no If all of the above answers are "NO", then may proceed with Cephalosporin use.     Prior to Admission medications   Medication Sig Start Date End Date Taking? Authorizing Provider  allopurinol (ZYLOPRIM)  100 MG tablet Take 100 mg by mouth every Monday, Wednesday, and Friday. Takes in the evening 05/23/20  Yes [provider]  amLODipine (NORVASC) 5 MG tablet Take 5 mg by mouth at bedtime. 06/01/16  Yes [provider]  AURYXIA 1 GM 210 MG(Fe) tablet Take 210 mg by mouth 2 (two) times daily with a meal. 12/19/22  Yes [provider]  calcitRIOL (ROCALTROL) 0.25 MCG capsule Take 0.25 mcg by mouth every Tuesday, Thursday, and Saturday at 6 PM. In the evening 04/15/20  Yes [provider]  ferrous sulfate 325 (65 FE) MG tablet Take 325 mg by  mouth in the morning. 06/07/20  Yes [provider]  furosemide (LASIX) 40 MG tablet Take 80 mg by mouth every morning. 05/01/20  Yes [provider]  hydrALAZINE (APRESOLINE) 25 MG tablet Take 25 mg by mouth in the morning and at bedtime. 10/19/22  Yes [provider]  omeprazole (PRILOSEC) 40 MG capsule Take 40 mg by mouth every morning. 06/01/16  Yes [provider]  HYDROcodone-acetaminophen (NORCO/VICODIN) 5-325 MG tablet Take 1-2 tablets by mouth every 4 (four) hours as needed for moderate pain. Patient not taking: Reported on 01/18/2023 07/19/16   Melida Quitter, MD    Physical Exam: Vitals:   01/18/23 1947 01/18/23 2144 01/18/23 2215  BP: (!) 141/87 (!) 179/109 134/79  Pulse: (!) 108 96   Resp: 20 20 (!) 26  Temp: 98.6 F (37 C)    TempSrc: Oral    SpO2: 96% 98%    Constitutional: NAD, calm, comfortable, thin elderly male laying approximately 20 degree incline in bed Eyes: lids and conjunctivae normal ENMT: Mucous membranes are moist.  Healing nasal bridge wound with sutures.  Has hearing impairment with hearing aids. Neck: normal, supple Respiratory: clear to auscultation bilaterally, no wheezing, no crackles. Normal respiratory effort. No accessory muscle use.  Cardiovascular: Regular rate and rhythm, no murmurs / rubs / gallops.  +2 pitting edema of right lower distal extremity  abdomen: no tenderness, Bowel sounds positive.  Musculoskeletal: no clubbing / cyanosis. No joint deformity upper and lower extremities. Good ROM, no contractures. Normal muscle tone.  Able to ambulate with minimal assistance. Skin: no rashes, lesions, ulcers Neurologic: CN 2-12 grossly intact.  Psychiatric: Normal judgment and insight. Alert and oriented x 3. Normal mood. Data Reviewed:  See HPI  Assessment and Plan: * Acute exacerbation of CHF (congestive heart failure) ([place]) -suspect could be new onset CHF with increasing vascular congestion seen on CXR -obtain  echocardiogram  -has received IV lasix '60mg'$  in the ED -Follow intake and output, daily weight  -will resume home oral '80mg'$  Lasix tomorrow and volume control with UF with dialysis tomorrow per nephrology   HTN (hypertension) -continue home amlodipine, hydralazine -also received IV lasix '60mg'$  in ED  Metabolic acidosis CO2 of 17 with creatinine of 10.35 due to missed dialysis tonight -follow with dialysis tomorrow  ESRD on dialysis [facility]) -on peritoneal dialysis with last session on 3/7 -needs to consult nephrology in the morning for dialysis. No urgent need overnight.  Anemia in chronic kidney disease Hgb stable at 10.1 -continue home ferrous supplement       Advance Care Planning:   Code Status: Full Code   Consults: needs to consult nephrology in the morning for dialysis  Family Communication: daughter at bedside  Severity of Illness: The appropriate patient status for this patient is OBSERVATION. Observation status is judged to be reasonable and necessary in order to provide the  required intensity of service to ensure the patient's safety. The patient's presenting symptoms, physical exam findings, and initial radiographic and laboratory data in the context of their medical condition is felt to place them at decreased risk for further clinical deterioration. Furthermore, it is anticipated that the patient will be medically stable for discharge from the hospital within 2 midnights of admission.   Author: Orene Desanctis, DO 01/19/2023 1:17 AM  For on call review [email or web address].

## 2023-01-19 NOTE — ED Notes (Signed)
Pt up to restroom, ambulating fine.

## 2023-01-19 NOTE — Progress Notes (Signed)
PROGRESS NOTE    Shaun Murillo  [id number] DOB: 1932/04/14 DOA: 01/18/2023 PCP: Alroy Dust, L.Marlou Sa, MD   Brief Narrative:  Shaun Murillo is a 89 y.o. male with medical history significant of ESRD on peritoneal dialysis,  HTN, right parotid mass s/p resection (2017) who presents with left sided pleuritic chest pain.  Patient also had notable fall with recent RSV diagnosis on 01/15/2023 with a fractured nasal bone and septum.  Patient missed peritoneal dialysis on 01/15/2023.  Given patient's presumed volume overload noted on imaging concern for heart failure exacerbation.  Admitted by hospitalist team nephrology consulted in the setting of peritoneal dialysis.   Assessment & Plan:   Principal Problem:   Acute exacerbation of CHF (congestive heart failure) (HCC) Active Problems:   Anemia in chronic kidney disease   ESRD on dialysis (HCC)   Metabolic acidosis   HTN (hypertension)   Acute pleuritic chest pain in the setting of pleural effusion Without hypoxia -Echocardiogram pending, rule out new onset heart failure -Volume status may be in the setting of missed peritoneal dialysis -Nephrology consulted -Lasix administered in the ED, urine output appropriate -follow I's and O's -Viral panel negative -Procalcitonin elevated but no clear source for infection at this time  ESRD on peritoneal dialysis [facility]) -on peritoneal dialysis with last session on 3/7 (missed 3/5 and 3/8) -Nephrology following - appreciate insight/recs  Hypertension, essential -continue home amlodipine, hydralazine -lasix ongoing   Metabolic acidosis - In the setting of above - follow PD/repeat labs  Chronic anemia in ESRD - Hgb stable at 10.1 - Continue home ferrous supplement   DVT prophylaxis: heparin Code Status: Full Family Communication: Son at bedside  Status is: Inpt  Dispo: The patient is from: Home              Anticipated d/c is to: Home              Anticipated d/c date is: 24-48h               Patient currently not medically stable for discharge  Consultants:  Nephro  Procedures:  None  Antimicrobials:  None   Subjective: No acute issues or events overnight, pleuritic chest pain appears to be improving, respiratory status resolving.  Denies nausea vomiting diarrhea constipation headache fevers chills  Objective: Vitals:   01/19/23 0200 01/19/23 0204 01/19/23 0400 01/19/23 0630  BP: 133/79  138/74 134/74  Pulse: 93  70 79  Resp:  20 (!) 22 18  Temp:   98.6 F (37 C)   TempSrc:   Oral   SpO2: 94%  91% 97%   No intake or output data in the 24 hours ending 01/19/23 0756 There were no vitals filed for this visit.  Examination:  General:  Pleasantly resting in bed, No acute distress. HEENT:  Normocephalic atraumatic.  Sclerae nonicteric, noninjected.  Extraocular movements intact bilaterally. Neck:  Without mass or deformity.  Trachea is midline. Lungs: Bibasilar rales without rhonchi, wheeze. Heart:  Regular rate and rhythm.  Without murmurs, rubs, or gallops. Abdomen:  Soft, nontender, nondistended.  Without guarding or rebound. Extremities: Without cyanosis, clubbing, edema, or obvious deformity. Vascular:  Dorsalis pedis and posterior tibial pulses palpable bilaterally. Skin:  Warm and dry, no erythema, no ulcerations.  Data Reviewed: I have personally reviewed following labs and imaging studies  CBC: Recent Labs  Lab 01/15/23 2046 01/18/23 1953 01/19/23 0257  WBC 13.4* 7.8 6.5  NEUTROABS 11.9*  --   --   HGB 11.0* 10.1*  9.7*  HCT 33.8* 30.5* 29.4*  MCV 98.3 97.1 98.0  PLT 222 270 [id number]   Basic Metabolic Panel: Recent Labs  Lab 01/15/23 2046 01/18/23 1953 01/19/23 0257  NA 135 133* 133*  K 4.5 4.2 4.1  CL 102 98 101  CO2 23 17* 20*  GLUCOSE 118* 108* 92  BUN 55* 59* 64*  CREATININE 10.20* 10.35* 11.18*  CALCIUM 8.3* 8.1* 8.1*   GFR: Estimated Creatinine Clearance: 4.4 mL/min (A) (by C-G formula based on SCr of 11.18 mg/dL (H)).  Liver  Function Tests: Recent Labs  Lab 01/15/23 2046  AST 32  ALT 33  ALKPHOS 113  BILITOT 0.5  PROT 5.6*  ALBUMIN 2.8*   Sepsis Labs: Recent Labs  Lab 01/18/23 2314  PROCALCITON 2.10    Recent Results (from the past 240 hour(s))  Resp panel by RT-PCR (RSV, Flu A&B, Covid) Anterior Nasal Swab     Status: Abnormal   Collection Time: 01/15/23  8:28 PM   Specimen: Anterior Nasal Swab  Result Value Ref Range Status   SARS Coronavirus 2 by RT PCR NEGATIVE NEGATIVE Final   Influenza A by PCR NEGATIVE NEGATIVE Final   Influenza B by PCR NEGATIVE NEGATIVE Final    Comment: (NOTE) The Xpert Xpress SARS-CoV-2/FLU/RSV plus assay is intended as an aid in the diagnosis of influenza from Nasopharyngeal swab specimens and should not be used as a sole basis for treatment. Nasal washings and aspirates are unacceptable for Xpert Xpress SARS-CoV-2/FLU/RSV testing.  Fact Sheet for Patients: EntrepreneurPulse.com.au  Fact Sheet for Healthcare Providers: IncredibleEmployment.be  This test is not yet approved or cleared by the [place] FDA and has been authorized for detection and/or diagnosis of SARS-CoV-2 by FDA under an Emergency Use Authorization (EUA). This EUA will remain in effect (meaning this test can be used) for the duration of the COVID-19 declaration under Section 564(b)(1) of the Act, 21 U.S.C. section 360bbb-3(b)(1), unless the authorization is terminated or revoked.     Resp Syncytial Virus by PCR POSITIVE (A) NEGATIVE Final    Comment: (NOTE) Fact Sheet for Patients: EntrepreneurPulse.com.au  Fact Sheet for Healthcare Providers: IncredibleEmployment.be  This test is not yet approved or cleared by the [place] FDA and has been authorized for detection and/or diagnosis of SARS-CoV-2 by FDA under an Emergency Use Authorization (EUA). This EUA will remain in effect (meaning this test can be  used) for the duration of the COVID-19 declaration under Section 564(b)(1) of the Act, 21 U.S.C. section 360bbb-3(b)(1), unless the authorization is terminated or revoked.  Performed at [facility], [place] [address] [place], [place] [address]    Radiology Studies: DG Chest Port 1 View  Result Date: 01/18/2023 CLINICAL DATA:  Congestion, cough, RSV and chest pain EXAM: PORTABLE CHEST 1 VIEW COMPARISON:  03/15/2023 FINDINGS: No focal consolidation, pleural effusion, or pneumothorax. Right basilar atelectasis. Similar cardiomegaly and pulmonary vascular congestion. Aortic atherosclerotic calcification. No displaced rib fractures. IMPRESSION: Cardiomegaly and pulmonary vascular congestion without overt pulmonary edema. Electronically Signed   By: Placido Sou M.D.   On: 01/18/2023 20:16    Scheduled Meds:  [START ON 01/21/2023] allopurinol  100 mg Oral Q M,W,F   amLODipine  5 mg Oral QHS   calcitRIOL  0.25 mcg Oral Q T,Th,Sat-1800   ferric citrate  210 mg Oral BID WC   ferrous sulfate  325 mg Oral q AM   furosemide  80 mg Oral q morning   heparin  5,000 Units Subcutaneous Q8H  hydrALAZINE  25 mg Oral BID   pantoprazole  40 mg Oral Daily   Continuous Infusions:   LOS: 0 days   Time spent: 55mn  Crescentia Boutwell C Kristoph Sattler, DO [facility]  If 7PM-7AM, please contact night-coverage [email or web address]  01/19/2023, 7:56 AM

## 2023-01-19 NOTE — Progress Notes (Signed)
  Echocardiogram 2D Echocardiogram has been performed.  Shaun Murillo 01/19/2023, 1:27 PM

## 2023-01-19 NOTE — Assessment & Plan Note (Signed)
CO2 of 17 with creatinine of 10.35 due to missed dialysis tonight -follow with dialysis tomorrow

## 2023-01-19 NOTE — ED Notes (Signed)
Pt back in bed, no acute distress noted. Vitals stable. Call bell in reach.

## 2023-01-20 DIAGNOSIS — Z992 Dependence on renal dialysis: Secondary | ICD-10-CM | POA: Diagnosis not present

## 2023-01-20 DIAGNOSIS — N186 End stage renal disease: Secondary | ICD-10-CM | POA: Diagnosis not present

## 2023-01-20 DIAGNOSIS — I10 Essential (primary) hypertension: Secondary | ICD-10-CM | POA: Diagnosis not present

## 2023-01-20 DIAGNOSIS — Z4932 Encounter for adequacy testing for peritoneal dialysis: Secondary | ICD-10-CM | POA: Diagnosis not present

## 2023-01-20 DIAGNOSIS — R0603 Acute respiratory distress: Secondary | ICD-10-CM

## 2023-01-20 DIAGNOSIS — K769 Liver disease, unspecified: Secondary | ICD-10-CM | POA: Diagnosis not present

## 2023-01-20 DIAGNOSIS — D631 Anemia in chronic kidney disease: Secondary | ICD-10-CM | POA: Diagnosis not present

## 2023-01-20 DIAGNOSIS — E872 Acidosis, unspecified: Secondary | ICD-10-CM | POA: Diagnosis not present

## 2023-01-20 DIAGNOSIS — N2581 Secondary hyperparathyroidism of renal origin: Secondary | ICD-10-CM | POA: Diagnosis not present

## 2023-01-20 LAB — CBC
HCT: 31.7 % — ABNORMAL LOW (ref 39.0–52.0)
Hemoglobin: 10.4 g/dL — ABNORMAL LOW (ref 13.0–17.0)
MCH: 31.8 pg (ref 26.0–34.0)
MCHC: 32.8 g/dL (ref 30.0–36.0)
MCV: 96.9 fL (ref 80.0–100.0)
Platelets: 294 10*3/uL (ref 150–400)
RBC: 3.27 MIL/uL — ABNORMAL LOW (ref 4.22–5.81)
RDW: 14.7 % (ref 11.5–15.5)
WBC: 7.5 10*3/uL (ref 4.0–10.5)
nRBC: 0 % (ref 0.0–0.2)

## 2023-01-20 LAB — BASIC METABOLIC PANEL
Anion gap: 11 (ref 5–15)
BUN: 63 mg/dL — ABNORMAL HIGH (ref 8–23)
CO2: 24 mmol/L (ref 22–32)
Calcium: 8.3 mg/dL — ABNORMAL LOW (ref 8.9–10.3)
Chloride: 98 mmol/L (ref 98–111)
Creatinine, Ser: 10.7 mg/dL — ABNORMAL HIGH (ref 0.61–1.24)
GFR, Estimated: 4 mL/min — ABNORMAL LOW (ref >60)
Glucose, Bld: 132 mg/dL — ABNORMAL HIGH (ref 70–99)
Potassium: 3.5 mmol/L (ref 3.5–5.1)
Sodium: 133 mmol/L — ABNORMAL LOW (ref 135–145)

## 2023-01-20 NOTE — Care Management (Signed)
  Transition of Care (TOC) Screening Note   Patient Details  Name: Shaun Murillo Date of Birth: 1932/04/23   [facility] CM/SW Contact:    Bethena Roys, RN Phone Number: 01/20/2023, 2:46 PM    Transition of Care Department [facility]) has reviewed the patient and no TOC needs have been identified at this time. Patient is discharging today and in need of a rollator.  Case Manager spoke with family member no agency preference. Case Manager sent the referral to [facility]. [employer] to deliver the DME to the room prior to transition home. Family will transport home. No further needs identified.

## 2023-01-20 NOTE — Discharge Summary (Signed)
Physician Discharge Summary  Shaun Murillo [id number] DOB: [birth year] DOA: 01/18/2023  PCP: Alroy Dust, L.Marlou Sa, MD  Admit date: 01/18/2023 Discharge date: 01/20/2023  Admitted From: Home Disposition:  Home  Recommendations for Outpatient Follow-up:  Follow up with PCP in 1-2 weeks Follow up with Nephrology in the morning to discuss PD in the future  Home Health:None  Equipment/Devices: Walker with seat  Discharge Condition:Stable  CODE STATUS:Full  Diet recommendation: Renal diet    Brief/Interim Summary: [name] is a 89 y.o. male with medical history significant of ESRD on peritoneal dialysis, HTN, right parotid mass s/p resection (2017) who presents with left sided pleuritic chest pain. Patient also had notable fall with recent RSV diagnosis on 01/15/2023 with a fractured nasal bone and septum. Patient missed peritoneal dialysis on 01/15/2023. Given patient's presumed volume overload noted on imaging concern for heart failure exacerbation. Admitted by hospitalist team nephrology consulted in the setting of peritoneal dialysis.  Patient admitted as above with acute pleuritic chest pain and shortness of breath in the setting of missed peritoneal dialysis, volume overload.  Patient was without hypoxia.  With increased peritoneal dialysis per nephrology patient symptoms have resolved overnight otherwise back to baseline.  We discussed following closely with nephrology in the morning to further discuss his ongoing peritoneal dialysis, it appears he has been changed somewhat recently from 4 bags per night to 3 bags per night which likely was only exacerbated by recent RSV infection causing his symptoms as above.  Regardless patient is back to baseline, lengthy discussion with family about close follow-up with PCP and nephrology to discuss ongoing care.  Plan is to discharge home with family this afternoon.  Discharge Diagnoses:  Principal Problem:   Acute exacerbation of CHF (congestive heart  failure) (HCC) Active Problems:   Anemia in chronic kidney disease   ESRD on dialysis (HCC)   Metabolic acidosis   HTN (hypertension)  Acute pleuritic chest pain in the setting of pleural effusion, resolved Without hypoxia -Echocardiogram unremarkable -Volume status likely elevated in the setting of missed peritoneal dialysis -Nephrology follow up in the morning to discuss ongoing PD -Lasix administered in the ED, urine output appropriate -follow I's and O's -Viral panel negative(recent RSV+ in outpatient setting 1 week prior to admission) -Procalcitonin elevated but no clear source for infection at this time   ESRD on peritoneal dialysis [facility]) -on peritoneal dialysis with last session on 3/7 (missed 3/5 and 3/8) -Nephrology follow up as discussed   Hypertension, essential -continue home amlodipine, hydralazine -lasix ongoing   Metabolic acidosis - In the setting of above - follow PD/repeat labs   Chronic anemia in ESRD - Hgb stable at 10.1 - Continue home ferrous supplement   Discharge Instructions   Allergies as of 01/20/2023       Reactions   Codeine Nausea And Vomiting   Penicillins Nausea And Vomiting   Has patient had a PCN reaction causing immediate rash, facial/tongue/throat swelling, SOB or lightheadedness with hypotension: no Has patient had a PCN reaction causing severe rash involving mucus membranes or skin necrosis:no Has patient had a PCN reaction that required hospitalization no Has patient had a PCN reaction occurring within the last 10 years: no If all of the above answers are "NO", then may proceed with Cephalosporin use.        Medication List     STOP taking these medications    HYDROcodone-acetaminophen 5-325 MG tablet Commonly known as: NORCO/VICODIN       TAKE these medications  allopurinol 100 MG tablet Commonly known as: ZYLOPRIM Take 100 mg by mouth every Monday, Wednesday, and Friday. Takes in the evening   amLODipine 5 MG  tablet Commonly known as: NORVASC Take 5 mg by mouth at bedtime.   Auryxia 1 GM 210 MG(Fe) tablet Generic drug: ferric citrate Take 210 mg by mouth 2 (two) times daily with a meal.   calcitRIOL 0.25 MCG capsule Commonly known as: ROCALTROL Take 0.25 mcg by mouth every Tuesday, Thursday, and Saturday at 6 PM. In the evening   ferrous sulfate 325 (65 FE) MG tablet Take 325 mg by mouth in the morning.   furosemide 40 MG tablet Commonly known as: LASIX Take 80 mg by mouth every morning.   hydrALAZINE 25 MG tablet Commonly known as: APRESOLINE Take 25 mg by mouth in the morning and at bedtime.   omeprazole 40 MG capsule Commonly known as: PRILOSEC Take 40 mg by mouth every morning.               Durable Medical Equipment  (From admission, onward)           Start     Ordered   01/20/23 1322  For home use only DME 4 wheeled rolling walker with seat  Once       Question:  Patient needs a walker to treat with the following condition  Answer:  Ambulatory dysfunction   01/20/23 1321            Allergies  Allergen Reactions   Codeine Nausea And Vomiting   Penicillins Nausea And Vomiting    Has patient had a PCN reaction causing immediate rash, facial/tongue/throat swelling, SOB or lightheadedness with hypotension: no Has patient had a PCN reaction causing severe rash involving mucus membranes or skin necrosis:no Has patient had a PCN reaction that required hospitalization no Has patient had a PCN reaction occurring within the last 10 years: no If all of the above answers are "NO", then may proceed with Cephalosporin use.    Consultations: Nephrology  Procedures/Studies: ECHOCARDIOGRAM COMPLETE  Result Date: 01/19/2023    ECHOCARDIOGRAM REPORT   Patient Name:   Shaun Murillo Date of Exam: 01/19/2023 Medical Rec #:  [id number]     Height:       70.0 in Accession #:    [id number]    Weight:       151.0 lb Date of Birth:  October 31, 1932     BSA:          1.852 m Patient  Age:    23 years      BP:           135/80 mmHg Patient Gender: M             HR:           97 bpm. Exam Location:  Inpatient Procedure: 2D Echo, Cardiac Doppler and Color Doppler Indications:    CHF-Acute Systolic [id number]  History:        Patient has no prior history of Echocardiogram examinations.                 CHF, Signs/Symptoms:Shortness of Breath; Risk Factors:Non-Smoker                 and Hypertension.  Sonographer:    Greer Pickerel Referring Phys: [id number] [place] T TU  Sonographer Comments: Image acquisition challenging due to patient body habitus and Image acquisition challenging due to respiratory motion. IMPRESSIONS  1. Left ventricular ejection fraction,  by estimation, is 55 to 60%. The left ventricle has normal function. The left ventricle has no regional wall motion abnormalities. There is mild concentric left ventricular hypertrophy. Left ventricular diastolic parameters are indeterminate.  2. Right ventricular systolic function is normal. The right ventricular size is mildly enlarged. Tricuspid regurgitation signal is inadequate for assessing PA pressure.  3. Left atrial size was mildly dilated.  4. The mitral valve is grossly normal. No evidence of mitral valve regurgitation.  5. The aortic valve is tricuspid. Aortic valve regurgitation is mild. Mild aortic valve stenosis.  6. Aortic dilatation noted. There is mild dilatation of the ascending aorta, measuring 41 mm.  7. The inferior vena cava is dilated in size with <50% respiratory variability, suggesting right atrial pressure of 15 mmHg. Comparison(s): No prior Echocardiogram. FINDINGS  Left Ventricle: Left ventricular ejection fraction, by estimation, is 55 to 60%. The left ventricle has normal function. The left ventricle has no regional wall motion abnormalities. The left ventricular internal cavity size was normal in size. There is  mild concentric left ventricular hypertrophy. Left ventricular diastolic parameters are indeterminate. Right  Ventricle: The right ventricular size is mildly enlarged. No increase in right ventricular wall thickness. Right ventricular systolic function is normal. Tricuspid regurgitation signal is inadequate for assessing PA pressure. Left Atrium: Left atrial size was mildly dilated. Right Atrium: Right atrial size was normal in size. Pericardium: Trivial pericardial effusion is present. Mitral Valve: The mitral valve is grossly normal. Mild mitral annular calcification. No evidence of mitral valve regurgitation. Tricuspid Valve: The tricuspid valve is normal in structure. Tricuspid valve regurgitation is not demonstrated. Aortic Valve: The aortic valve is tricuspid. Aortic valve regurgitation is mild. Mild aortic stenosis is present. Aortic valve mean gradient measures 15.0 mmHg. Aortic valve peak gradient measures 24.5 mmHg. Aortic valve area, by VTI measures 1.88 cm. Pulmonic Valve: The pulmonic valve was not well visualized. Pulmonic valve regurgitation is not visualized. Aorta: Aortic dilatation noted. There is mild dilatation of the ascending aorta, measuring 41 mm. Venous: The inferior vena cava is dilated in size with less than 50% respiratory variability, suggesting right atrial pressure of 15 mmHg. IAS/Shunts: The atrial septum is grossly normal.  LEFT VENTRICLE PLAX 2D LVIDd:         5.20 cm   Diastology LVIDs:         3.80 cm   LV e' medial:    6.53 cm/s LV PW:         1.10 cm   LV E/e' medial:  9.1 LV IVS:        1.10 cm   LV e' lateral:   9.57 cm/s LVOT diam:     2.10 cm   LV E/e' lateral: 6.2 LV SV:         90 LV SV Index:   48 LVOT Area:     3.46 cm  RIGHT VENTRICLE RV S prime:     20.20 cm/s TAPSE (M-mode): 2.3 cm LEFT ATRIUM           Index        RIGHT ATRIUM           Index LA diam:      4.10 cm 2.21 cm/m   RA Area:     16.60 cm LA Vol (A2C): 42.8 ml 23.11 ml/m  RA Volume:   45.80 ml  24.72 ml/m LA Vol (A4C): 63.2 ml 34.12 ml/m  AORTIC VALVE AV Area (Vmax):    1.88 cm AV  Area (Vmean):   1.79 cm  AV Area (VTI):     1.88 cm AV Vmax:           247.50 cm/s AV Vmean:          180.500 cm/s AV VTI:            0.477 m AV Peak Grad:      24.5 mmHg AV Mean Grad:      15.0 mmHg LVOT Vmax:         134.00 cm/s LVOT Vmean:        93.300 cm/s LVOT VTI:          0.259 m LVOT/AV VTI ratio: 0.54  AORTA Ao Root diam: 3.80 cm Ao Asc diam:  4.10 cm MITRAL VALVE MV Area (PHT): 3.77 cm     SHUNTS MV Decel Time: 201 msec     Systemic VTI:  0.26 m MV E velocity: 59.10 cm/s   Systemic Diam: 2.10 cm MV A velocity: 110.00 cm/s MV E/A ratio:  0.54 Rudean Haskell MD Electronically signed by Rudean Haskell MD Signature Date/Time: 01/19/2023/2:30:34 PM    Final    DG Chest Port 1 View  Result Date: 01/18/2023 CLINICAL DATA:  Congestion, cough, RSV and chest pain EXAM: PORTABLE CHEST 1 VIEW COMPARISON:  03/15/2023 FINDINGS: No focal consolidation, pleural effusion, or pneumothorax. Right basilar atelectasis. Similar cardiomegaly and pulmonary vascular congestion. Aortic atherosclerotic calcification. No displaced rib fractures. IMPRESSION: Cardiomegaly and pulmonary vascular congestion without overt pulmonary edema. Electronically Signed   By: Placido Sou M.D.   On: 01/18/2023 20:16   CT Head Wo Contrast  Result Date: 01/15/2023 CLINICAL DATA:  Trauma. EXAM: CT HEAD WITHOUT CONTRAST CT MAXILLOFACIAL WITHOUT CONTRAST CT CERVICAL SPINE WITHOUT CONTRAST TECHNIQUE: Multidetector CT imaging of the head, cervical spine, and maxillofacial structures were performed using the standard protocol without intravenous contrast. Multiplanar CT image reconstructions of the cervical spine and maxillofacial structures were also generated. RADIATION DOSE REDUCTION: This exam was performed according to the departmental dose-optimization program which includes automated exposure control, adjustment of the mA and/or kV according to patient size and/or use of iterative reconstruction technique. COMPARISON:  Head CT dated 06/13/2020.  FINDINGS: CT HEAD FINDINGS Brain: Moderate age-related atrophy and chronic microvascular ischemic changes. There is no acute intracranial hemorrhage. No mass effect or midline shift. No extra-axial fluid collection. Vascular: No hyperdense vessel or unexpected calcification. Skull: Normal. Negative for fracture or focal lesion. Other: None CT MAXILLOFACIAL FINDINGS Osseous: There is fracture of the nasal septum with deviation of the nose to the left. Mildly displaced fracture of the right nasal bone. No other acute fracture. No mandibular dislocation. Orbits: The globes and retro-orbital fat are preserved. Sinuses: Partial opacification of the left maxillary sinus and several ethmoid air cells. The mastoid air cells are clear. Soft tissues: Soft tissue swelling over the nose. CT CERVICAL SPINE FINDINGS Alignment: No acute subluxation. There is straightening of normal cervical lordosis which may be positional or due to muscle spasm. Skull base and vertebrae: No acute fracture.  Osteopenia. Soft tissues and spinal canal: No prevertebral fluid or swelling. No visible canal hematoma. Disc levels:  No acute findings.  Multilevel degenerative changes. Upper chest: Negative. Other: Bilateral carotid bulb calcified plaques. IMPRESSION: 1. No acute intracranial pathology. Moderate age-related atrophy and chronic microvascular ischemic changes. 2. No acute/traumatic cervical spine pathology. 3. Fractures of the right nasal bone and nasal septum. Electronically Signed   By: Anner Crete M.D.   On: 01/15/2023  21:44   CT Maxillofacial Wo Contrast  Result Date: 01/15/2023 CLINICAL DATA:  Trauma. EXAM: CT HEAD WITHOUT CONTRAST CT MAXILLOFACIAL WITHOUT CONTRAST CT CERVICAL SPINE WITHOUT CONTRAST TECHNIQUE: Multidetector CT imaging of the head, cervical spine, and maxillofacial structures were performed using the standard protocol without intravenous contrast. Multiplanar CT image reconstructions of the cervical spine and  maxillofacial structures were also generated. RADIATION DOSE REDUCTION: This exam was performed according to the departmental dose-optimization program which includes automated exposure control, adjustment of the mA and/or kV according to patient size and/or use of iterative reconstruction technique. COMPARISON:  Head CT dated 06/13/2020. FINDINGS: CT HEAD FINDINGS Brain: Moderate age-related atrophy and chronic microvascular ischemic changes. There is no acute intracranial hemorrhage. No mass effect or midline shift. No extra-axial fluid collection. Vascular: No hyperdense vessel or unexpected calcification. Skull: Normal. Negative for fracture or focal lesion. Other: None CT MAXILLOFACIAL FINDINGS Osseous: There is fracture of the nasal septum with deviation of the nose to the left. Mildly displaced fracture of the right nasal bone. No other acute fracture. No mandibular dislocation. Orbits: The globes and retro-orbital fat are preserved. Sinuses: Partial opacification of the left maxillary sinus and several ethmoid air cells. The mastoid air cells are clear. Soft tissues: Soft tissue swelling over the nose. CT CERVICAL SPINE FINDINGS Alignment: No acute subluxation. There is straightening of normal cervical lordosis which may be positional or due to muscle spasm. Skull base and vertebrae: No acute fracture.  Osteopenia. Soft tissues and spinal canal: No prevertebral fluid or swelling. No visible canal hematoma. Disc levels:  No acute findings.  Multilevel degenerative changes. Upper chest: Negative. Other: Bilateral carotid bulb calcified plaques. IMPRESSION: 1. No acute intracranial pathology. Moderate age-related atrophy and chronic microvascular ischemic changes. 2. No acute/traumatic cervical spine pathology. 3. Fractures of the right nasal bone and nasal septum. Electronically Signed   By: Anner Crete M.D.   On: 01/15/2023 21:44   CT Cervical Spine Wo Contrast  Result Date: 01/15/2023 CLINICAL DATA:   Trauma. EXAM: CT HEAD WITHOUT CONTRAST CT MAXILLOFACIAL WITHOUT CONTRAST CT CERVICAL SPINE WITHOUT CONTRAST TECHNIQUE: Multidetector CT imaging of the head, cervical spine, and maxillofacial structures were performed using the standard protocol without intravenous contrast. Multiplanar CT image reconstructions of the cervical spine and maxillofacial structures were also generated. RADIATION DOSE REDUCTION: This exam was performed according to the departmental dose-optimization program which includes automated exposure control, adjustment of the mA and/or kV according to patient size and/or use of iterative reconstruction technique. COMPARISON:  Head CT dated 06/13/2020. FINDINGS: CT HEAD FINDINGS Brain: Moderate age-related atrophy and chronic microvascular ischemic changes. There is no acute intracranial hemorrhage. No mass effect or midline shift. No extra-axial fluid collection. Vascular: No hyperdense vessel or unexpected calcification. Skull: Normal. Negative for fracture or focal lesion. Other: None CT MAXILLOFACIAL FINDINGS Osseous: There is fracture of the nasal septum with deviation of the nose to the left. Mildly displaced fracture of the right nasal bone. No other acute fracture. No mandibular dislocation. Orbits: The globes and retro-orbital fat are preserved. Sinuses: Partial opacification of the left maxillary sinus and several ethmoid air cells. The mastoid air cells are clear. Soft tissues: Soft tissue swelling over the nose. CT CERVICAL SPINE FINDINGS Alignment: No acute subluxation. There is straightening of normal cervical lordosis which may be positional or due to muscle spasm. Skull base and vertebrae: No acute fracture.  Osteopenia. Soft tissues and spinal canal: No prevertebral fluid or swelling. No visible canal hematoma. Disc levels:  No acute findings.  Multilevel degenerative changes. Upper chest: Negative. Other: Bilateral carotid bulb calcified plaques. IMPRESSION: 1. No acute  intracranial pathology. Moderate age-related atrophy and chronic microvascular ischemic changes. 2. No acute/traumatic cervical spine pathology. 3. Fractures of the right nasal bone and nasal septum. Electronically Signed   By: Anner Crete M.D.   On: 01/15/2023 21:44   DG Chest 2 View  Result Date: 01/15/2023 CLINICAL DATA:  Cough, fall EXAM: CHEST - 2 VIEW COMPARISON:  06/25/2004 FINDINGS: Low lung volumes. Cardiomegaly, vascular congestion. Bibasilar atelectasis. No effusions or acute bony abnormality. IMPRESSION: Cardiomegaly, vascular congestion. Low volumes with bibasilar atelectasis. Electronically Signed   By: Rolm Baptise M.D.   On: 01/15/2023 21:39   DG Shoulder Right  Result Date: 01/15/2023 CLINICAL DATA:  Cough, fall EXAM: RIGHT SHOULDER - 2+ VIEW COMPARISON:  None Available. FINDINGS: Advanced degenerative changes in the right [facility] joint. Glenohumeral joint intact. No acute bony abnormality. Specifically, no fracture, subluxation, or dislocation. IMPRESSION: Degenerative changes in the right AC joint. No acute bony abnormality. Electronically Signed   By: Rolm Baptise M.D.   On: 01/15/2023 21:38     Subjective: No acute issues or events overnight   Discharge Exam: Vitals:   01/20/23 0758 01/20/23 0949  BP: 127/65 126/77  Pulse: 81   Resp: 16   Temp: 97.8 F (36.6 C)   SpO2: 93%    Vitals:   01/19/23 2318 01/20/23 0446 01/20/23 0758 01/20/23 0949  BP: 129/87 135/81 127/65 126/77  Pulse: 90 92 81   Resp: '17 18 16   '$ Temp: 98.5 F (36.9 C) 97.9 F (36.6 C) 97.8 F (36.6 C)   TempSrc: Oral  Oral   SpO2: 93% 92% 93%   Weight:  61.6 kg      General: Pt is alert, awake, not in acute distress Cardiovascular: RRR, S1/S2 +, no rubs, no gallops Respiratory: CTA bilaterally, no wheezing, no rhonchi Abdominal: Soft, NT, ND, bowel sounds + Extremities: no edema, no cyanosis    The results of significant diagnostics from this hospitalization (including imaging,  microbiology, ancillary and laboratory) are listed below for reference.     Microbiology: Recent Results (from the past 240 hour(s))  Resp panel by RT-PCR (RSV, Flu A&B, Covid) Anterior Nasal Swab     Status: Abnormal   Collection Time: 01/15/23  8:28 PM   Specimen: Anterior Nasal Swab  Result Value Ref Range Status   SARS Coronavirus 2 by RT PCR NEGATIVE NEGATIVE Final   Influenza A by PCR NEGATIVE NEGATIVE Final   Influenza B by PCR NEGATIVE NEGATIVE Final    Comment: (NOTE) The Xpert Xpress SARS-CoV-2/FLU/RSV plus assay is intended as an aid in the diagnosis of influenza from Nasopharyngeal swab specimens and should not be used as a sole basis for treatment. Nasal washings and aspirates are unacceptable for Xpert Xpress SARS-CoV-2/FLU/RSV testing.  Fact Sheet for Patients: EntrepreneurPulse.com.au  Fact Sheet for Healthcare Providers: IncredibleEmployment.be  This test is not yet approved or cleared by the [place] FDA and has been authorized for detection and/or diagnosis of SARS-CoV-2 by FDA under an Emergency Use Authorization (EUA). This EUA will remain in effect (meaning this test can be used) for the duration of the COVID-19 declaration under Section 564(b)(1) of the Act, 21 U.S.C. section 360bbb-3(b)(1), unless the authorization is terminated or revoked.     Resp Syncytial Virus by PCR POSITIVE (A) NEGATIVE Final    Comment: (NOTE) Fact Sheet for Patients: EntrepreneurPulse.com.au  Fact Sheet for  Healthcare Providers: IncredibleEmployment.be  This test is not yet approved or cleared by the [place] and has been authorized for detection and/or diagnosis of SARS-CoV-2 by FDA under an Emergency Use Authorization (EUA). This EUA will remain in effect (meaning this test can be used) for the duration of the COVID-19 declaration under Section 564(b)(1) of the Act, 21 U.S.C. section  360bbb-3(b)(1), unless the authorization is terminated or revoked.  Performed at [facility], [place] [address] [place], [place] [address]      Labs: BNP (last 3 results) Recent Labs    01/18/23 1953  BNP 0000000*   Basic Metabolic Panel: Recent Labs  Lab 01/15/23 2046 01/18/23 1953 01/19/23 0257 01/20/23 0952  NA 135 133* 133* 133*  K 4.5 4.2 4.1 3.5  CL 102 98 101 98  CO2 23 17* 20* 24  GLUCOSE 118* 108* 92 132*  BUN 55* 59* 64* 63*  CREATININE 10.20* 10.35* 11.18* 10.70*  CALCIUM 8.3* 8.1* 8.1* 8.3*   Liver Function Tests: Recent Labs  Lab 01/15/23 2046  AST 32  ALT 33  ALKPHOS 113  BILITOT 0.5  PROT 5.6*  ALBUMIN 2.8*   No results for input(s): "LIPASE", "AMYLASE" in the last 168 hours. No results for input(s): "AMMONIA" in the last 168 hours. CBC: Recent Labs  Lab 01/15/23 2046 01/18/23 1953 01/19/23 0257 01/20/23 0952  WBC 13.4* 7.8 6.5 7.5  NEUTROABS 11.9*  --   --   --   HGB 11.0* 10.1* 9.7* 10.4*  HCT 33.8* 30.5* 29.4* 31.7*  MCV 98.3 97.1 98.0 96.9  PLT 222 270 242 294   Cardiac Enzymes: No results for input(s): "CKTOTAL", "CKMB", "CKMBINDEX", "TROPONINI" in the last 168 hours. BNP: Invalid input(s): "POCBNP" CBG: No results for input(s): "GLUCAP" in the last 168 hours. D-Dimer No results for input(s): "DDIMER" in the last 72 hours. Hgb A1c No results for input(s): "HGBA1C" in the last 72 hours. Lipid Profile No results for input(s): "CHOL", "HDL", "LDLCALC", "TRIG", "CHOLHDL", "LDLDIRECT" in the last 72 hours. Thyroid function studies No results for input(s): "TSH", "T4TOTAL", "T3FREE", "THYROIDAB" in the last 72 hours.  Invalid input(s): "FREET3" Anemia work up No results for input(s): "VITAMINB12", "FOLATE", "FERRITIN", "TIBC", "IRON", "RETICCTPCT" in the last 72 hours. Urinalysis    Component Value Date/Time   COLORURINE YELLOW 06/13/2020 1025   APPEARANCEUR CLEAR 06/13/2020 1025   LABSPEC 1.014 06/13/2020 1025    PHURINE 7.0 06/13/2020 1025   GLUCOSEU 150 (A) 06/13/2020 1025   HGBUR SMALL (A) 06/13/2020 1025   BILIRUBINUR NEGATIVE 06/13/2020 1025   KETONESUR NEGATIVE 06/13/2020 1025   PROTEINUR >=300 (A) 06/13/2020 1025   UROBILINOGEN 0.2 05/17/2010 1600   NITRITE NEGATIVE 06/13/2020 1025   LEUKOCYTESUR NEGATIVE 06/13/2020 1025   Sepsis Labs Recent Labs  Lab 01/15/23 2046 01/18/23 1953 01/19/23 0257 01/20/23 0952  WBC 13.4* 7.8 6.5 7.5   Microbiology Recent Results (from the past 240 hour(s))  Resp panel by RT-PCR (RSV, Flu A&B, Covid) Anterior Nasal Swab     Status: Abnormal   Collection Time: 01/15/23  8:28 PM   Specimen: Anterior Nasal Swab  Result Value Ref Range Status   SARS Coronavirus 2 by RT PCR NEGATIVE NEGATIVE Final   Influenza A by PCR NEGATIVE NEGATIVE Final   Influenza B by PCR NEGATIVE NEGATIVE Final    Comment: (NOTE) The Xpert Xpress SARS-CoV-2/FLU/RSV plus assay is intended as an aid in the diagnosis of influenza from Nasopharyngeal swab specimens and should not be used  as a sole basis for treatment. Nasal washings and aspirates are unacceptable for Xpert Xpress SARS-CoV-2/FLU/RSV testing.  Fact Sheet for Patients: EntrepreneurPulse.com.au  Fact Sheet for Healthcare Providers: IncredibleEmployment.be  This test is not yet approved or cleared by the [place] FDA and has been authorized for detection and/or diagnosis of SARS-CoV-2 by FDA under an Emergency Use Authorization (EUA). This EUA will remain in effect (meaning this test can be used) for the duration of the COVID-19 declaration under Section 564(b)(1) of the Act, 21 U.S.C. section 360bbb-3(b)(1), unless the authorization is terminated or revoked.     Resp Syncytial Virus by PCR POSITIVE (A) NEGATIVE Final    Comment: (NOTE) Fact Sheet for Patients: EntrepreneurPulse.com.au  Fact Sheet for Healthcare  Providers: IncredibleEmployment.be  This test is not yet approved or cleared by the [place] FDA and has been authorized for detection and/or diagnosis of SARS-CoV-2 by FDA under an Emergency Use Authorization (EUA). This EUA will remain in effect (meaning this test can be used) for the duration of the COVID-19 declaration under Section 564(b)(1) of the Act, 21 U.S.C. section 360bbb-3(b)(1), unless the authorization is terminated or revoked.  Performed at [facility], [place] [address], [place], [place] [address]      Time coordinating discharge: Over 30 minutes  SIGNED:   Little Ishikawa, DO [facility] 01/20/2023, 1:22 PM Pager   If 7PM-7AM, please contact night-coverage [email or web address]

## 2023-01-20 NOTE — Progress Notes (Signed)
[facility] Progress Note   Subjective:    Seen and examined patient at bedside. He reports tolerating PD overnight. Plan for discharge today.  Objective Vitals:   01/19/23 2318 01/20/23 0446 01/20/23 0758 01/20/23 0949  BP: 129/87 135/81 127/65 126/77  Pulse: 90 92 81   Resp: '17 18 16   '$ Temp: 98.5 F (36.9 C) 97.9 F (36.6 C) 97.8 F (36.6 C)   TempSrc: Oral  Oral   SpO2: 93% 92% 93%   Weight:  61.6 kg     Physical Exam Gen alert, elderly WM w/ eschar on the face/ nose, afebrile, on RA, NAD No rash, cyanosis or gangrene Sclera anicteric, throat clear  No jvd or bruits Chest scattered upper airway noises, mild rales lower lobes RRR no RG Abd soft ntnd no mass or ascites +bs PD cath in place mid-abd GU normal male Ext no LE edema Neuro is alert, Ox 3 , nf  Filed Weights   01/19/23 1341 01/19/23 1815 01/20/23 0446  Weight: 62.9 kg 64 kg 61.6 kg    Intake/Output Summary (Last 24 hours) at 01/20/2023 1411 Last data filed at 01/20/2023 0356 Gross per 24 hour  Intake --  Output 550 ml  Net -550 ml    Additional Objective Labs: Basic Metabolic Panel: Recent Labs  Lab 01/18/23 1953 01/19/23 0257 01/20/23 0952  NA 133* 133* 133*  K 4.2 4.1 3.5  CL 98 101 98  CO2 17* 20* 24  GLUCOSE 108* 92 132*  BUN 59* 64* 63*  CREATININE 10.35* 11.18* 10.70*  CALCIUM 8.1* 8.1* 8.3*   Liver Function Tests: Recent Labs  Lab 01/15/23 2046  AST 32  ALT 33  ALKPHOS 113  BILITOT 0.5  PROT 5.6*  ALBUMIN 2.8*   No results for input(s): "LIPASE", "AMYLASE" in the last 168 hours. CBC: Recent Labs  Lab 01/15/23 2046 01/18/23 1953 01/19/23 0257 01/20/23 0952  WBC 13.4* 7.8 6.5 7.5  NEUTROABS 11.9*  --   --   --   HGB 11.0* 10.1* 9.7* 10.4*  HCT 33.8* 30.5* 29.4* 31.7*  MCV 98.3 97.1 98.0 96.9  PLT 222 270 242 294   Blood Culture    Component Value Date/Time   SDES ABSCESS RIGHT 07/18/2016 1100   SPECREQUEST RIGHT PAROTID ABSCESS 07/18/2016 1100    CULT No growth aerobically or anaerobically. 07/18/2016 1100   REPTSTATUS 07/23/2016 FINAL 07/18/2016 1100    Cardiac Enzymes: No results for input(s): "CKTOTAL", "CKMB", "CKMBINDEX", "TROPONINI" in the last 168 hours. CBG: No results for input(s): "GLUCAP" in the last 168 hours. Iron Studies: No results for input(s): "IRON", "TIBC", "TRANSFERRIN", "FERRITIN" in the last 72 hours. Lab Results  Component Value Date   INR 0.99 05/17/2010   Studies/Results: ECHOCARDIOGRAM COMPLETE  Result Date: 01/19/2023    ECHOCARDIOGRAM REPORT   Patient Name:   Shaun Murillo Date of Exam: 01/19/2023 Medical Rec #:  [id number]     Height:       70.0 in Accession #:    [id number]    Weight:       151.0 lb Date of Birth:  [birth year]     BSA:          1.852 m Patient Age:    89 years      BP:           135/80 mmHg Patient Gender: M             HR:  97 bpm. Exam Location:  Inpatient Procedure: 2D Echo, Cardiac Doppler and Color Doppler Indications:    CHF-Acute Systolic [id number]  History:        Patient has no prior history of Echocardiogram examinations.                 CHF, Signs/Symptoms:Shortness of Breath; Risk Factors:Non-Smoker                 and Hypertension.  Sonographer:    Greer Pickerel Referring Phys: [id number] [place] T TU  Sonographer Comments: Image acquisition challenging due to patient body habitus and Image acquisition challenging due to respiratory motion. IMPRESSIONS  1. Left ventricular ejection fraction, by estimation, is 55 to 60%. The left ventricle has normal function. The left ventricle has no regional wall motion abnormalities. There is mild concentric left ventricular hypertrophy. Left ventricular diastolic parameters are indeterminate.  2. Right ventricular systolic function is normal. The right ventricular size is mildly enlarged. Tricuspid regurgitation signal is inadequate for assessing PA pressure.  3. Left atrial size was mildly dilated.  4. The mitral valve is grossly normal. No  evidence of mitral valve regurgitation.  5. The aortic valve is tricuspid. Aortic valve regurgitation is mild. Mild aortic valve stenosis.  6. Aortic dilatation noted. There is mild dilatation of the ascending aorta, measuring 41 mm.  7. The inferior vena cava is dilated in size with <50% respiratory variability, suggesting right atrial pressure of 15 mmHg. Comparison(s): No prior Echocardiogram. FINDINGS  Left Ventricle: Left ventricular ejection fraction, by estimation, is 55 to 60%. The left ventricle has normal function. The left ventricle has no regional wall motion abnormalities. The left ventricular internal cavity size was normal in size. There is  mild concentric left ventricular hypertrophy. Left ventricular diastolic parameters are indeterminate. Right Ventricle: The right ventricular size is mildly enlarged. No increase in right ventricular wall thickness. Right ventricular systolic function is normal. Tricuspid regurgitation signal is inadequate for assessing PA pressure. Left Atrium: Left atrial size was mildly dilated. Right Atrium: Right atrial size was normal in size. Pericardium: Trivial pericardial effusion is present. Mitral Valve: The mitral valve is grossly normal. Mild mitral annular calcification. No evidence of mitral valve regurgitation. Tricuspid Valve: The tricuspid valve is normal in structure. Tricuspid valve regurgitation is not demonstrated. Aortic Valve: The aortic valve is tricuspid. Aortic valve regurgitation is mild. Mild aortic stenosis is present. Aortic valve mean gradient measures 15.0 mmHg. Aortic valve peak gradient measures 24.5 mmHg. Aortic valve area, by VTI measures 1.88 cm. Pulmonic Valve: The pulmonic valve was not well visualized. Pulmonic valve regurgitation is not visualized. Aorta: Aortic dilatation noted. There is mild dilatation of the ascending aorta, measuring 41 mm. Venous: The inferior vena cava is dilated in size with less than 50% respiratory variability,  suggesting right atrial pressure of 15 mmHg. IAS/Shunts: The atrial septum is grossly normal.  LEFT VENTRICLE PLAX 2D LVIDd:         5.20 cm   Diastology LVIDs:         3.80 cm   LV e' medial:    6.53 cm/s LV PW:         1.10 cm   LV E/e' medial:  9.1 LV IVS:        1.10 cm   LV e' lateral:   9.57 cm/s LVOT diam:     2.10 cm   LV E/e' lateral: 6.2 LV SV:         90 LV SV  Index:   48 LVOT Area:     3.46 cm  RIGHT VENTRICLE RV S prime:     20.20 cm/s TAPSE (M-mode): 2.3 cm LEFT ATRIUM           Index        RIGHT ATRIUM           Index LA diam:      4.10 cm 2.21 cm/m   RA Area:     16.60 cm LA Vol (A2C): 42.8 ml 23.11 ml/m  RA Volume:   45.80 ml  24.72 ml/m LA Vol (A4C): 63.2 ml 34.12 ml/m  AORTIC VALVE AV Area (Vmax):    1.88 cm AV Area (Vmean):   1.79 cm AV Area (VTI):     1.88 cm AV Vmax:           247.50 cm/s AV Vmean:          180.500 cm/s AV VTI:            0.477 m AV Peak Grad:      24.5 mmHg AV Mean Grad:      15.0 mmHg LVOT Vmax:         134.00 cm/s LVOT Vmean:        93.300 cm/s LVOT VTI:          0.259 m LVOT/AV VTI ratio: 0.54  AORTA Ao Root diam: 3.80 cm Ao Asc diam:  4.10 cm MITRAL VALVE MV Area (PHT): 3.77 cm     SHUNTS MV Decel Time: 201 msec     Systemic VTI:  0.26 m MV E velocity: 59.10 cm/s   Systemic Diam: 2.10 cm MV A velocity: 110.00 cm/s MV E/A ratio:  0.54 Rudean Haskell MD Electronically signed by Rudean Haskell MD Signature Date/Time: 01/19/2023/2:30:34 PM    Final    DG Chest Port 1 View  Result Date: 01/18/2023 CLINICAL DATA:  Congestion, cough, RSV and chest pain EXAM: PORTABLE CHEST 1 VIEW COMPARISON:  03/15/2023 FINDINGS: No focal consolidation, pleural effusion, or pneumothorax. Right basilar atelectasis. Similar cardiomegaly and pulmonary vascular congestion. Aortic atherosclerotic calcification. No displaced rib fractures. IMPRESSION: Cardiomegaly and pulmonary vascular congestion without overt pulmonary edema. Electronically Signed   By: Placido Sou M.D.    On: 01/18/2023 20:16    Medications:  dialysis solution 2.5% low-MG/low-CA Stopped (01/20/23 1406)   dialysis solution 4.25% low-MG/low-CA Stopped (01/20/23 1404)    [START ON 01/21/2023] allopurinol  100 mg Oral Q M,W,F   amLODipine  5 mg Oral QHS   calcitRIOL  0.25 mcg Oral Q T,Th,Sat-1800   ferric citrate  210 mg Oral BID WC   ferrous sulfate  325 mg Oral q AM   gentamicin cream  1 Application Topical Daily   heparin  5,000 Units Subcutaneous Q8H   hydrALAZINE  25 mg Oral BID   pantoprazole  40 mg Oral Daily    Dialysis Orders: 7d/ week, 3 exchanges overnight, 2000 ml fill, 1.5h dwell, total time 8 hrs, dry wt 64kg     Assessment/Plan: SOB / cough - w/ +RSV and CXR showing vasc congestion. Admitted for new onset "CHF". Seen in ED, pt is not in distress, lots of coughing. Not grossly vol overloaded.  RSV infection - dx'd earlier this week  ESRD - on PD nightly at home. Tolerated PD overnight and performed 4 exchanges instead of 3 to get more solute and fluid off. Advised patient to return back to regular regimen tonight and f/u with his primary Nephrologist  if further adjustments to his PD regimen need to be made. HTN - BP's are good, cont home meds Volume - needs a weight when gets to a room. Mild congestion on CXR. Used mix of 2.5% and 4.25% w/ PD overnight to attempt to get vol down.  Anemia esrd - Hb 10- 11 here, no esa needs. Follow.  MBD ckd - CCa in range, will add on phos. Cont auryxia 1 ac tid as binder, and po vdra. Dispo- okay for discharge from renal standpoint. Patient needs to continue PD and Lasix regimen and follow-up with his primary Nephrologist on Monday 01/21/23 to re-evaluate his PD regimen if indicated.  Tobie Poet, NP [facility] 01/20/2023,2:11 PM  LOS: 0 days

## 2023-01-21 DIAGNOSIS — N2581 Secondary hyperparathyroidism of renal origin: Secondary | ICD-10-CM | POA: Diagnosis not present

## 2023-01-21 DIAGNOSIS — Z992 Dependence on renal dialysis: Secondary | ICD-10-CM | POA: Diagnosis not present

## 2023-01-21 DIAGNOSIS — K769 Liver disease, unspecified: Secondary | ICD-10-CM | POA: Diagnosis not present

## 2023-01-21 DIAGNOSIS — D631 Anemia in chronic kidney disease: Secondary | ICD-10-CM | POA: Diagnosis not present

## 2023-01-21 DIAGNOSIS — N186 End stage renal disease: Secondary | ICD-10-CM | POA: Diagnosis not present

## 2023-01-21 DIAGNOSIS — Z4932 Encounter for adequacy testing for peritoneal dialysis: Secondary | ICD-10-CM | POA: Diagnosis not present

## 2023-01-21 NOTE — Progress Notes (Signed)
Late Note Entry   Pt was D/C to home yesterday. Contacted by renal NP that pt's home therapy clinic be contacted this am. Spoke to pt's son who states that pt receives PD care at [facility]. Son states family spoke to PD RN last night. Contacted [facility] this morning and spoke to [place]. Clinic aware of pt's d/c and confirmed speaking with family. Pt to resume PD care at d/c.   Melven Sartorius Renal Navigator [phone]

## 2023-01-22 DIAGNOSIS — K769 Liver disease, unspecified: Secondary | ICD-10-CM | POA: Diagnosis not present

## 2023-01-22 DIAGNOSIS — Z4932 Encounter for adequacy testing for peritoneal dialysis: Secondary | ICD-10-CM | POA: Diagnosis not present

## 2023-01-22 DIAGNOSIS — D631 Anemia in chronic kidney disease: Secondary | ICD-10-CM | POA: Diagnosis not present

## 2023-01-22 DIAGNOSIS — N2581 Secondary hyperparathyroidism of renal origin: Secondary | ICD-10-CM | POA: Diagnosis not present

## 2023-01-22 DIAGNOSIS — Z992 Dependence on renal dialysis: Secondary | ICD-10-CM | POA: Diagnosis not present

## 2023-01-22 DIAGNOSIS — N186 End stage renal disease: Secondary | ICD-10-CM | POA: Diagnosis not present

## 2023-01-23 DIAGNOSIS — Z992 Dependence on renal dialysis: Secondary | ICD-10-CM | POA: Diagnosis not present

## 2023-01-23 DIAGNOSIS — K769 Liver disease, unspecified: Secondary | ICD-10-CM | POA: Diagnosis not present

## 2023-01-23 DIAGNOSIS — Z4932 Encounter for adequacy testing for peritoneal dialysis: Secondary | ICD-10-CM | POA: Diagnosis not present

## 2023-01-23 DIAGNOSIS — D631 Anemia in chronic kidney disease: Secondary | ICD-10-CM | POA: Diagnosis not present

## 2023-01-23 DIAGNOSIS — N186 End stage renal disease: Secondary | ICD-10-CM | POA: Diagnosis not present

## 2023-01-23 DIAGNOSIS — N2581 Secondary hyperparathyroidism of renal origin: Secondary | ICD-10-CM | POA: Diagnosis not present

## 2023-01-24 DIAGNOSIS — N186 End stage renal disease: Secondary | ICD-10-CM | POA: Diagnosis not present

## 2023-01-24 DIAGNOSIS — N2581 Secondary hyperparathyroidism of renal origin: Secondary | ICD-10-CM | POA: Diagnosis not present

## 2023-01-24 DIAGNOSIS — Z4932 Encounter for adequacy testing for peritoneal dialysis: Secondary | ICD-10-CM | POA: Diagnosis not present

## 2023-01-24 DIAGNOSIS — D631 Anemia in chronic kidney disease: Secondary | ICD-10-CM | POA: Diagnosis not present

## 2023-01-24 DIAGNOSIS — Z992 Dependence on renal dialysis: Secondary | ICD-10-CM | POA: Diagnosis not present

## 2023-01-24 DIAGNOSIS — K769 Liver disease, unspecified: Secondary | ICD-10-CM | POA: Diagnosis not present

## 2023-01-25 DIAGNOSIS — K769 Liver disease, unspecified: Secondary | ICD-10-CM | POA: Diagnosis not present

## 2023-01-25 DIAGNOSIS — D631 Anemia in chronic kidney disease: Secondary | ICD-10-CM | POA: Diagnosis not present

## 2023-01-25 DIAGNOSIS — N2581 Secondary hyperparathyroidism of renal origin: Secondary | ICD-10-CM | POA: Diagnosis not present

## 2023-01-25 DIAGNOSIS — Z992 Dependence on renal dialysis: Secondary | ICD-10-CM | POA: Diagnosis not present

## 2023-01-25 DIAGNOSIS — Z4932 Encounter for adequacy testing for peritoneal dialysis: Secondary | ICD-10-CM | POA: Diagnosis not present

## 2023-01-25 DIAGNOSIS — N186 End stage renal disease: Secondary | ICD-10-CM | POA: Diagnosis not present

## 2023-01-26 DIAGNOSIS — D631 Anemia in chronic kidney disease: Secondary | ICD-10-CM | POA: Diagnosis not present

## 2023-01-26 DIAGNOSIS — N2581 Secondary hyperparathyroidism of renal origin: Secondary | ICD-10-CM | POA: Diagnosis not present

## 2023-01-26 DIAGNOSIS — Z4932 Encounter for adequacy testing for peritoneal dialysis: Secondary | ICD-10-CM | POA: Diagnosis not present

## 2023-01-26 DIAGNOSIS — K769 Liver disease, unspecified: Secondary | ICD-10-CM | POA: Diagnosis not present

## 2023-01-26 DIAGNOSIS — N186 End stage renal disease: Secondary | ICD-10-CM | POA: Diagnosis not present

## 2023-01-26 DIAGNOSIS — Z992 Dependence on renal dialysis: Secondary | ICD-10-CM | POA: Diagnosis not present

## 2023-01-27 DIAGNOSIS — Z992 Dependence on renal dialysis: Secondary | ICD-10-CM | POA: Diagnosis not present

## 2023-01-27 DIAGNOSIS — D631 Anemia in chronic kidney disease: Secondary | ICD-10-CM | POA: Diagnosis not present

## 2023-01-27 DIAGNOSIS — Z4932 Encounter for adequacy testing for peritoneal dialysis: Secondary | ICD-10-CM | POA: Diagnosis not present

## 2023-01-27 DIAGNOSIS — N186 End stage renal disease: Secondary | ICD-10-CM | POA: Diagnosis not present

## 2023-01-27 DIAGNOSIS — K769 Liver disease, unspecified: Secondary | ICD-10-CM | POA: Diagnosis not present

## 2023-01-27 DIAGNOSIS — N2581 Secondary hyperparathyroidism of renal origin: Secondary | ICD-10-CM | POA: Diagnosis not present

## 2023-01-28 DIAGNOSIS — D631 Anemia in chronic kidney disease: Secondary | ICD-10-CM | POA: Diagnosis not present

## 2023-01-28 DIAGNOSIS — N186 End stage renal disease: Secondary | ICD-10-CM | POA: Diagnosis not present

## 2023-01-28 DIAGNOSIS — Z4932 Encounter for adequacy testing for peritoneal dialysis: Secondary | ICD-10-CM | POA: Diagnosis not present

## 2023-01-28 DIAGNOSIS — Z992 Dependence on renal dialysis: Secondary | ICD-10-CM | POA: Diagnosis not present

## 2023-01-28 DIAGNOSIS — K769 Liver disease, unspecified: Secondary | ICD-10-CM | POA: Diagnosis not present

## 2023-01-28 DIAGNOSIS — N2581 Secondary hyperparathyroidism of renal origin: Secondary | ICD-10-CM | POA: Diagnosis not present

## 2023-01-29 DIAGNOSIS — K769 Liver disease, unspecified: Secondary | ICD-10-CM | POA: Diagnosis not present

## 2023-01-29 DIAGNOSIS — N186 End stage renal disease: Secondary | ICD-10-CM | POA: Diagnosis not present

## 2023-01-29 DIAGNOSIS — N2581 Secondary hyperparathyroidism of renal origin: Secondary | ICD-10-CM | POA: Diagnosis not present

## 2023-01-29 DIAGNOSIS — Z992 Dependence on renal dialysis: Secondary | ICD-10-CM | POA: Diagnosis not present

## 2023-01-29 DIAGNOSIS — Z4932 Encounter for adequacy testing for peritoneal dialysis: Secondary | ICD-10-CM | POA: Diagnosis not present

## 2023-01-29 DIAGNOSIS — D631 Anemia in chronic kidney disease: Secondary | ICD-10-CM | POA: Diagnosis not present

## 2023-01-30 DIAGNOSIS — D631 Anemia in chronic kidney disease: Secondary | ICD-10-CM | POA: Diagnosis not present

## 2023-01-30 DIAGNOSIS — N186 End stage renal disease: Secondary | ICD-10-CM | POA: Diagnosis not present

## 2023-01-30 DIAGNOSIS — K769 Liver disease, unspecified: Secondary | ICD-10-CM | POA: Diagnosis not present

## 2023-01-30 DIAGNOSIS — N2581 Secondary hyperparathyroidism of renal origin: Secondary | ICD-10-CM | POA: Diagnosis not present

## 2023-01-30 DIAGNOSIS — Z4932 Encounter for adequacy testing for peritoneal dialysis: Secondary | ICD-10-CM | POA: Diagnosis not present

## 2023-01-30 DIAGNOSIS — Z992 Dependence on renal dialysis: Secondary | ICD-10-CM | POA: Diagnosis not present

## 2023-01-31 DIAGNOSIS — D631 Anemia in chronic kidney disease: Secondary | ICD-10-CM | POA: Diagnosis not present

## 2023-01-31 DIAGNOSIS — K769 Liver disease, unspecified: Secondary | ICD-10-CM | POA: Diagnosis not present

## 2023-01-31 DIAGNOSIS — Z992 Dependence on renal dialysis: Secondary | ICD-10-CM | POA: Diagnosis not present

## 2023-01-31 DIAGNOSIS — N2581 Secondary hyperparathyroidism of renal origin: Secondary | ICD-10-CM | POA: Diagnosis not present

## 2023-01-31 DIAGNOSIS — N186 End stage renal disease: Secondary | ICD-10-CM | POA: Diagnosis not present

## 2023-01-31 DIAGNOSIS — Z4932 Encounter for adequacy testing for peritoneal dialysis: Secondary | ICD-10-CM | POA: Diagnosis not present

## 2023-02-01 DIAGNOSIS — D631 Anemia in chronic kidney disease: Secondary | ICD-10-CM | POA: Diagnosis not present

## 2023-02-01 DIAGNOSIS — K769 Liver disease, unspecified: Secondary | ICD-10-CM | POA: Diagnosis not present

## 2023-02-01 DIAGNOSIS — N186 End stage renal disease: Secondary | ICD-10-CM | POA: Diagnosis not present

## 2023-02-01 DIAGNOSIS — N2581 Secondary hyperparathyroidism of renal origin: Secondary | ICD-10-CM | POA: Diagnosis not present

## 2023-02-01 DIAGNOSIS — Z4932 Encounter for adequacy testing for peritoneal dialysis: Secondary | ICD-10-CM | POA: Diagnosis not present

## 2023-02-01 DIAGNOSIS — Z992 Dependence on renal dialysis: Secondary | ICD-10-CM | POA: Diagnosis not present

## 2023-02-02 DIAGNOSIS — Z4932 Encounter for adequacy testing for peritoneal dialysis: Secondary | ICD-10-CM | POA: Diagnosis not present

## 2023-02-02 DIAGNOSIS — N186 End stage renal disease: Secondary | ICD-10-CM | POA: Diagnosis not present

## 2023-02-02 DIAGNOSIS — K769 Liver disease, unspecified: Secondary | ICD-10-CM | POA: Diagnosis not present

## 2023-02-02 DIAGNOSIS — N2581 Secondary hyperparathyroidism of renal origin: Secondary | ICD-10-CM | POA: Diagnosis not present

## 2023-02-02 DIAGNOSIS — Z992 Dependence on renal dialysis: Secondary | ICD-10-CM | POA: Diagnosis not present

## 2023-02-02 DIAGNOSIS — D631 Anemia in chronic kidney disease: Secondary | ICD-10-CM | POA: Diagnosis not present

## 2023-02-03 DIAGNOSIS — D631 Anemia in chronic kidney disease: Secondary | ICD-10-CM | POA: Diagnosis not present

## 2023-02-03 DIAGNOSIS — K769 Liver disease, unspecified: Secondary | ICD-10-CM | POA: Diagnosis not present

## 2023-02-03 DIAGNOSIS — N2581 Secondary hyperparathyroidism of renal origin: Secondary | ICD-10-CM | POA: Diagnosis not present

## 2023-02-03 DIAGNOSIS — Z992 Dependence on renal dialysis: Secondary | ICD-10-CM | POA: Diagnosis not present

## 2023-02-03 DIAGNOSIS — Z4932 Encounter for adequacy testing for peritoneal dialysis: Secondary | ICD-10-CM | POA: Diagnosis not present

## 2023-02-03 DIAGNOSIS — N186 End stage renal disease: Secondary | ICD-10-CM | POA: Diagnosis not present

## 2023-02-04 DIAGNOSIS — K769 Liver disease, unspecified: Secondary | ICD-10-CM | POA: Diagnosis not present

## 2023-02-04 DIAGNOSIS — Z4932 Encounter for adequacy testing for peritoneal dialysis: Secondary | ICD-10-CM | POA: Diagnosis not present

## 2023-02-04 DIAGNOSIS — Z992 Dependence on renal dialysis: Secondary | ICD-10-CM | POA: Diagnosis not present

## 2023-02-04 DIAGNOSIS — N2581 Secondary hyperparathyroidism of renal origin: Secondary | ICD-10-CM | POA: Diagnosis not present

## 2023-02-04 DIAGNOSIS — N186 End stage renal disease: Secondary | ICD-10-CM | POA: Diagnosis not present

## 2023-02-04 DIAGNOSIS — D631 Anemia in chronic kidney disease: Secondary | ICD-10-CM | POA: Diagnosis not present

## 2023-02-05 DIAGNOSIS — N2581 Secondary hyperparathyroidism of renal origin: Secondary | ICD-10-CM | POA: Diagnosis not present

## 2023-02-05 DIAGNOSIS — Z992 Dependence on renal dialysis: Secondary | ICD-10-CM | POA: Diagnosis not present

## 2023-02-05 DIAGNOSIS — D631 Anemia in chronic kidney disease: Secondary | ICD-10-CM | POA: Diagnosis not present

## 2023-02-05 DIAGNOSIS — Z4932 Encounter for adequacy testing for peritoneal dialysis: Secondary | ICD-10-CM | POA: Diagnosis not present

## 2023-02-05 DIAGNOSIS — N186 End stage renal disease: Secondary | ICD-10-CM | POA: Diagnosis not present

## 2023-02-05 DIAGNOSIS — K769 Liver disease, unspecified: Secondary | ICD-10-CM | POA: Diagnosis not present

## 2023-02-06 DIAGNOSIS — K769 Liver disease, unspecified: Secondary | ICD-10-CM | POA: Diagnosis not present

## 2023-02-06 DIAGNOSIS — Z4932 Encounter for adequacy testing for peritoneal dialysis: Secondary | ICD-10-CM | POA: Diagnosis not present

## 2023-02-06 DIAGNOSIS — N186 End stage renal disease: Secondary | ICD-10-CM | POA: Diagnosis not present

## 2023-02-06 DIAGNOSIS — Z992 Dependence on renal dialysis: Secondary | ICD-10-CM | POA: Diagnosis not present

## 2023-02-06 DIAGNOSIS — N2581 Secondary hyperparathyroidism of renal origin: Secondary | ICD-10-CM | POA: Diagnosis not present

## 2023-02-06 DIAGNOSIS — D631 Anemia in chronic kidney disease: Secondary | ICD-10-CM | POA: Diagnosis not present

## 2023-02-07 DIAGNOSIS — K769 Liver disease, unspecified: Secondary | ICD-10-CM | POA: Diagnosis not present

## 2023-02-07 DIAGNOSIS — N2581 Secondary hyperparathyroidism of renal origin: Secondary | ICD-10-CM | POA: Diagnosis not present

## 2023-02-07 DIAGNOSIS — D631 Anemia in chronic kidney disease: Secondary | ICD-10-CM | POA: Diagnosis not present

## 2023-02-07 DIAGNOSIS — N186 End stage renal disease: Secondary | ICD-10-CM | POA: Diagnosis not present

## 2023-02-07 DIAGNOSIS — Z4932 Encounter for adequacy testing for peritoneal dialysis: Secondary | ICD-10-CM | POA: Diagnosis not present

## 2023-02-07 DIAGNOSIS — Z992 Dependence on renal dialysis: Secondary | ICD-10-CM | POA: Diagnosis not present

## 2023-02-08 DIAGNOSIS — Z4932 Encounter for adequacy testing for peritoneal dialysis: Secondary | ICD-10-CM | POA: Diagnosis not present

## 2023-02-08 DIAGNOSIS — K769 Liver disease, unspecified: Secondary | ICD-10-CM | POA: Diagnosis not present

## 2023-02-08 DIAGNOSIS — N2581 Secondary hyperparathyroidism of renal origin: Secondary | ICD-10-CM | POA: Diagnosis not present

## 2023-02-08 DIAGNOSIS — N186 End stage renal disease: Secondary | ICD-10-CM | POA: Diagnosis not present

## 2023-02-08 DIAGNOSIS — Z992 Dependence on renal dialysis: Secondary | ICD-10-CM | POA: Diagnosis not present

## 2023-02-08 DIAGNOSIS — D631 Anemia in chronic kidney disease: Secondary | ICD-10-CM | POA: Diagnosis not present

## 2023-02-09 DIAGNOSIS — K769 Liver disease, unspecified: Secondary | ICD-10-CM | POA: Diagnosis not present

## 2023-02-09 DIAGNOSIS — N186 End stage renal disease: Secondary | ICD-10-CM | POA: Diagnosis not present

## 2023-02-09 DIAGNOSIS — Z4932 Encounter for adequacy testing for peritoneal dialysis: Secondary | ICD-10-CM | POA: Diagnosis not present

## 2023-02-09 DIAGNOSIS — Z992 Dependence on renal dialysis: Secondary | ICD-10-CM | POA: Diagnosis not present

## 2023-02-09 DIAGNOSIS — N2581 Secondary hyperparathyroidism of renal origin: Secondary | ICD-10-CM | POA: Diagnosis not present

## 2023-02-09 DIAGNOSIS — D631 Anemia in chronic kidney disease: Secondary | ICD-10-CM | POA: Diagnosis not present

## 2023-02-10 DIAGNOSIS — Z992 Dependence on renal dialysis: Secondary | ICD-10-CM | POA: Diagnosis not present

## 2023-02-10 DIAGNOSIS — K769 Liver disease, unspecified: Secondary | ICD-10-CM | POA: Diagnosis not present

## 2023-02-10 DIAGNOSIS — N186 End stage renal disease: Secondary | ICD-10-CM | POA: Diagnosis not present

## 2023-02-10 DIAGNOSIS — Z4932 Encounter for adequacy testing for peritoneal dialysis: Secondary | ICD-10-CM | POA: Diagnosis not present

## 2023-02-10 DIAGNOSIS — D631 Anemia in chronic kidney disease: Secondary | ICD-10-CM | POA: Diagnosis not present

## 2023-02-10 DIAGNOSIS — N2581 Secondary hyperparathyroidism of renal origin: Secondary | ICD-10-CM | POA: Diagnosis not present

## 2023-02-10 DIAGNOSIS — I129 Hypertensive chronic kidney disease with stage 1 through stage 4 chronic kidney disease, or unspecified chronic kidney disease: Secondary | ICD-10-CM | POA: Diagnosis not present

## 2023-02-11 DIAGNOSIS — K769 Liver disease, unspecified: Secondary | ICD-10-CM | POA: Diagnosis not present

## 2023-02-11 DIAGNOSIS — Z4932 Encounter for adequacy testing for peritoneal dialysis: Secondary | ICD-10-CM | POA: Diagnosis not present

## 2023-02-11 DIAGNOSIS — Z992 Dependence on renal dialysis: Secondary | ICD-10-CM | POA: Diagnosis not present

## 2023-02-11 DIAGNOSIS — N186 End stage renal disease: Secondary | ICD-10-CM | POA: Diagnosis not present

## 2023-02-11 DIAGNOSIS — N2581 Secondary hyperparathyroidism of renal origin: Secondary | ICD-10-CM | POA: Diagnosis not present

## 2023-02-11 DIAGNOSIS — D509 Iron deficiency anemia, unspecified: Secondary | ICD-10-CM | POA: Diagnosis not present

## 2023-02-11 DIAGNOSIS — D631 Anemia in chronic kidney disease: Secondary | ICD-10-CM | POA: Diagnosis not present

## 2023-02-12 DIAGNOSIS — K769 Liver disease, unspecified: Secondary | ICD-10-CM | POA: Diagnosis not present

## 2023-02-12 DIAGNOSIS — Z992 Dependence on renal dialysis: Secondary | ICD-10-CM | POA: Diagnosis not present

## 2023-02-12 DIAGNOSIS — D631 Anemia in chronic kidney disease: Secondary | ICD-10-CM | POA: Diagnosis not present

## 2023-02-12 DIAGNOSIS — Z4932 Encounter for adequacy testing for peritoneal dialysis: Secondary | ICD-10-CM | POA: Diagnosis not present

## 2023-02-12 DIAGNOSIS — N186 End stage renal disease: Secondary | ICD-10-CM | POA: Diagnosis not present

## 2023-02-12 DIAGNOSIS — N2581 Secondary hyperparathyroidism of renal origin: Secondary | ICD-10-CM | POA: Diagnosis not present

## 2023-02-12 DIAGNOSIS — D509 Iron deficiency anemia, unspecified: Secondary | ICD-10-CM | POA: Diagnosis not present

## 2023-02-13 DIAGNOSIS — N2581 Secondary hyperparathyroidism of renal origin: Secondary | ICD-10-CM | POA: Diagnosis not present

## 2023-02-13 DIAGNOSIS — K4090 Unilateral inguinal hernia, without obstruction or gangrene, not specified as recurrent: Secondary | ICD-10-CM | POA: Diagnosis not present

## 2023-02-13 DIAGNOSIS — K769 Liver disease, unspecified: Secondary | ICD-10-CM | POA: Diagnosis not present

## 2023-02-13 DIAGNOSIS — Z992 Dependence on renal dialysis: Secondary | ICD-10-CM | POA: Diagnosis not present

## 2023-02-13 DIAGNOSIS — N186 End stage renal disease: Secondary | ICD-10-CM | POA: Diagnosis not present

## 2023-02-13 DIAGNOSIS — Z4932 Encounter for adequacy testing for peritoneal dialysis: Secondary | ICD-10-CM | POA: Diagnosis not present

## 2023-02-13 DIAGNOSIS — D631 Anemia in chronic kidney disease: Secondary | ICD-10-CM | POA: Diagnosis not present

## 2023-02-13 DIAGNOSIS — D509 Iron deficiency anemia, unspecified: Secondary | ICD-10-CM | POA: Diagnosis not present

## 2023-02-14 DIAGNOSIS — N2581 Secondary hyperparathyroidism of renal origin: Secondary | ICD-10-CM | POA: Diagnosis not present

## 2023-02-14 DIAGNOSIS — D631 Anemia in chronic kidney disease: Secondary | ICD-10-CM | POA: Diagnosis not present

## 2023-02-14 DIAGNOSIS — D509 Iron deficiency anemia, unspecified: Secondary | ICD-10-CM | POA: Diagnosis not present

## 2023-02-14 DIAGNOSIS — K769 Liver disease, unspecified: Secondary | ICD-10-CM | POA: Diagnosis not present

## 2023-02-14 DIAGNOSIS — Z4932 Encounter for adequacy testing for peritoneal dialysis: Secondary | ICD-10-CM | POA: Diagnosis not present

## 2023-02-14 DIAGNOSIS — Z992 Dependence on renal dialysis: Secondary | ICD-10-CM | POA: Diagnosis not present

## 2023-02-14 DIAGNOSIS — N186 End stage renal disease: Secondary | ICD-10-CM | POA: Diagnosis not present

## 2023-02-15 DIAGNOSIS — K769 Liver disease, unspecified: Secondary | ICD-10-CM | POA: Diagnosis not present

## 2023-02-15 DIAGNOSIS — N186 End stage renal disease: Secondary | ICD-10-CM | POA: Diagnosis not present

## 2023-02-15 DIAGNOSIS — Z992 Dependence on renal dialysis: Secondary | ICD-10-CM | POA: Diagnosis not present

## 2023-02-15 DIAGNOSIS — D631 Anemia in chronic kidney disease: Secondary | ICD-10-CM | POA: Diagnosis not present

## 2023-02-15 DIAGNOSIS — D509 Iron deficiency anemia, unspecified: Secondary | ICD-10-CM | POA: Diagnosis not present

## 2023-02-15 DIAGNOSIS — N2581 Secondary hyperparathyroidism of renal origin: Secondary | ICD-10-CM | POA: Diagnosis not present

## 2023-02-15 DIAGNOSIS — Z4932 Encounter for adequacy testing for peritoneal dialysis: Secondary | ICD-10-CM | POA: Diagnosis not present

## 2023-02-16 DIAGNOSIS — Z992 Dependence on renal dialysis: Secondary | ICD-10-CM | POA: Diagnosis not present

## 2023-02-16 DIAGNOSIS — Z4932 Encounter for adequacy testing for peritoneal dialysis: Secondary | ICD-10-CM | POA: Diagnosis not present

## 2023-02-16 DIAGNOSIS — N2581 Secondary hyperparathyroidism of renal origin: Secondary | ICD-10-CM | POA: Diagnosis not present

## 2023-02-16 DIAGNOSIS — D631 Anemia in chronic kidney disease: Secondary | ICD-10-CM | POA: Diagnosis not present

## 2023-02-16 DIAGNOSIS — D509 Iron deficiency anemia, unspecified: Secondary | ICD-10-CM | POA: Diagnosis not present

## 2023-02-16 DIAGNOSIS — K769 Liver disease, unspecified: Secondary | ICD-10-CM | POA: Diagnosis not present

## 2023-02-16 DIAGNOSIS — N186 End stage renal disease: Secondary | ICD-10-CM | POA: Diagnosis not present

## 2023-02-17 DIAGNOSIS — N2581 Secondary hyperparathyroidism of renal origin: Secondary | ICD-10-CM | POA: Diagnosis not present

## 2023-02-17 DIAGNOSIS — D631 Anemia in chronic kidney disease: Secondary | ICD-10-CM | POA: Diagnosis not present

## 2023-02-17 DIAGNOSIS — N186 End stage renal disease: Secondary | ICD-10-CM | POA: Diagnosis not present

## 2023-02-17 DIAGNOSIS — D509 Iron deficiency anemia, unspecified: Secondary | ICD-10-CM | POA: Diagnosis not present

## 2023-02-17 DIAGNOSIS — K769 Liver disease, unspecified: Secondary | ICD-10-CM | POA: Diagnosis not present

## 2023-02-17 DIAGNOSIS — Z4932 Encounter for adequacy testing for peritoneal dialysis: Secondary | ICD-10-CM | POA: Diagnosis not present

## 2023-02-17 DIAGNOSIS — Z992 Dependence on renal dialysis: Secondary | ICD-10-CM | POA: Diagnosis not present

## 2023-02-18 DIAGNOSIS — N2581 Secondary hyperparathyroidism of renal origin: Secondary | ICD-10-CM | POA: Diagnosis not present

## 2023-02-18 DIAGNOSIS — N186 End stage renal disease: Secondary | ICD-10-CM | POA: Diagnosis not present

## 2023-02-18 DIAGNOSIS — Z992 Dependence on renal dialysis: Secondary | ICD-10-CM | POA: Diagnosis not present

## 2023-02-18 DIAGNOSIS — D631 Anemia in chronic kidney disease: Secondary | ICD-10-CM | POA: Diagnosis not present

## 2023-02-18 DIAGNOSIS — D509 Iron deficiency anemia, unspecified: Secondary | ICD-10-CM | POA: Diagnosis not present

## 2023-02-18 DIAGNOSIS — Z4932 Encounter for adequacy testing for peritoneal dialysis: Secondary | ICD-10-CM | POA: Diagnosis not present

## 2023-02-18 DIAGNOSIS — K769 Liver disease, unspecified: Secondary | ICD-10-CM | POA: Diagnosis not present

## 2023-02-19 DIAGNOSIS — N186 End stage renal disease: Secondary | ICD-10-CM | POA: Diagnosis not present

## 2023-02-19 DIAGNOSIS — Z4932 Encounter for adequacy testing for peritoneal dialysis: Secondary | ICD-10-CM | POA: Diagnosis not present

## 2023-02-19 DIAGNOSIS — K769 Liver disease, unspecified: Secondary | ICD-10-CM | POA: Diagnosis not present

## 2023-02-19 DIAGNOSIS — N2581 Secondary hyperparathyroidism of renal origin: Secondary | ICD-10-CM | POA: Diagnosis not present

## 2023-02-19 DIAGNOSIS — Z992 Dependence on renal dialysis: Secondary | ICD-10-CM | POA: Diagnosis not present

## 2023-02-19 DIAGNOSIS — D631 Anemia in chronic kidney disease: Secondary | ICD-10-CM | POA: Diagnosis not present

## 2023-02-19 DIAGNOSIS — D509 Iron deficiency anemia, unspecified: Secondary | ICD-10-CM | POA: Diagnosis not present

## 2023-02-20 DIAGNOSIS — N186 End stage renal disease: Secondary | ICD-10-CM | POA: Diagnosis not present

## 2023-02-20 DIAGNOSIS — D631 Anemia in chronic kidney disease: Secondary | ICD-10-CM | POA: Diagnosis not present

## 2023-02-20 DIAGNOSIS — K769 Liver disease, unspecified: Secondary | ICD-10-CM | POA: Diagnosis not present

## 2023-02-20 DIAGNOSIS — Z4932 Encounter for adequacy testing for peritoneal dialysis: Secondary | ICD-10-CM | POA: Diagnosis not present

## 2023-02-20 DIAGNOSIS — N2581 Secondary hyperparathyroidism of renal origin: Secondary | ICD-10-CM | POA: Diagnosis not present

## 2023-02-20 DIAGNOSIS — D509 Iron deficiency anemia, unspecified: Secondary | ICD-10-CM | POA: Diagnosis not present

## 2023-02-20 DIAGNOSIS — Z992 Dependence on renal dialysis: Secondary | ICD-10-CM | POA: Diagnosis not present

## 2023-02-21 DIAGNOSIS — Z992 Dependence on renal dialysis: Secondary | ICD-10-CM | POA: Diagnosis not present

## 2023-02-21 DIAGNOSIS — N2581 Secondary hyperparathyroidism of renal origin: Secondary | ICD-10-CM | POA: Diagnosis not present

## 2023-02-21 DIAGNOSIS — D509 Iron deficiency anemia, unspecified: Secondary | ICD-10-CM | POA: Diagnosis not present

## 2023-02-21 DIAGNOSIS — D631 Anemia in chronic kidney disease: Secondary | ICD-10-CM | POA: Diagnosis not present

## 2023-02-21 DIAGNOSIS — K769 Liver disease, unspecified: Secondary | ICD-10-CM | POA: Diagnosis not present

## 2023-02-21 DIAGNOSIS — Z4932 Encounter for adequacy testing for peritoneal dialysis: Secondary | ICD-10-CM | POA: Diagnosis not present

## 2023-02-21 DIAGNOSIS — N186 End stage renal disease: Secondary | ICD-10-CM | POA: Diagnosis not present

## 2023-02-22 DIAGNOSIS — K769 Liver disease, unspecified: Secondary | ICD-10-CM | POA: Diagnosis not present

## 2023-02-22 DIAGNOSIS — D631 Anemia in chronic kidney disease: Secondary | ICD-10-CM | POA: Diagnosis not present

## 2023-02-22 DIAGNOSIS — N186 End stage renal disease: Secondary | ICD-10-CM | POA: Diagnosis not present

## 2023-02-22 DIAGNOSIS — Z992 Dependence on renal dialysis: Secondary | ICD-10-CM | POA: Diagnosis not present

## 2023-02-22 DIAGNOSIS — Z4932 Encounter for adequacy testing for peritoneal dialysis: Secondary | ICD-10-CM | POA: Diagnosis not present

## 2023-02-22 DIAGNOSIS — N2581 Secondary hyperparathyroidism of renal origin: Secondary | ICD-10-CM | POA: Diagnosis not present

## 2023-02-22 DIAGNOSIS — D509 Iron deficiency anemia, unspecified: Secondary | ICD-10-CM | POA: Diagnosis not present

## 2023-02-23 DIAGNOSIS — N2581 Secondary hyperparathyroidism of renal origin: Secondary | ICD-10-CM | POA: Diagnosis not present

## 2023-02-23 DIAGNOSIS — D509 Iron deficiency anemia, unspecified: Secondary | ICD-10-CM | POA: Diagnosis not present

## 2023-02-23 DIAGNOSIS — N186 End stage renal disease: Secondary | ICD-10-CM | POA: Diagnosis not present

## 2023-02-23 DIAGNOSIS — K769 Liver disease, unspecified: Secondary | ICD-10-CM | POA: Diagnosis not present

## 2023-02-23 DIAGNOSIS — Z4932 Encounter for adequacy testing for peritoneal dialysis: Secondary | ICD-10-CM | POA: Diagnosis not present

## 2023-02-23 DIAGNOSIS — Z992 Dependence on renal dialysis: Secondary | ICD-10-CM | POA: Diagnosis not present

## 2023-02-23 DIAGNOSIS — D631 Anemia in chronic kidney disease: Secondary | ICD-10-CM | POA: Diagnosis not present

## 2023-02-24 DIAGNOSIS — D631 Anemia in chronic kidney disease: Secondary | ICD-10-CM | POA: Diagnosis not present

## 2023-02-24 DIAGNOSIS — N186 End stage renal disease: Secondary | ICD-10-CM | POA: Diagnosis not present

## 2023-02-24 DIAGNOSIS — N2581 Secondary hyperparathyroidism of renal origin: Secondary | ICD-10-CM | POA: Diagnosis not present

## 2023-02-24 DIAGNOSIS — K769 Liver disease, unspecified: Secondary | ICD-10-CM | POA: Diagnosis not present

## 2023-02-24 DIAGNOSIS — D509 Iron deficiency anemia, unspecified: Secondary | ICD-10-CM | POA: Diagnosis not present

## 2023-02-24 DIAGNOSIS — Z4932 Encounter for adequacy testing for peritoneal dialysis: Secondary | ICD-10-CM | POA: Diagnosis not present

## 2023-02-24 DIAGNOSIS — Z992 Dependence on renal dialysis: Secondary | ICD-10-CM | POA: Diagnosis not present

## 2023-02-25 DIAGNOSIS — N2581 Secondary hyperparathyroidism of renal origin: Secondary | ICD-10-CM | POA: Diagnosis not present

## 2023-02-25 DIAGNOSIS — D631 Anemia in chronic kidney disease: Secondary | ICD-10-CM | POA: Diagnosis not present

## 2023-02-25 DIAGNOSIS — N186 End stage renal disease: Secondary | ICD-10-CM | POA: Diagnosis not present

## 2023-02-25 DIAGNOSIS — D509 Iron deficiency anemia, unspecified: Secondary | ICD-10-CM | POA: Diagnosis not present

## 2023-02-25 DIAGNOSIS — Z992 Dependence on renal dialysis: Secondary | ICD-10-CM | POA: Diagnosis not present

## 2023-02-25 DIAGNOSIS — K769 Liver disease, unspecified: Secondary | ICD-10-CM | POA: Diagnosis not present

## 2023-02-25 DIAGNOSIS — Z4932 Encounter for adequacy testing for peritoneal dialysis: Secondary | ICD-10-CM | POA: Diagnosis not present

## 2023-02-26 DIAGNOSIS — K769 Liver disease, unspecified: Secondary | ICD-10-CM | POA: Diagnosis not present

## 2023-02-26 DIAGNOSIS — Z4932 Encounter for adequacy testing for peritoneal dialysis: Secondary | ICD-10-CM | POA: Diagnosis not present

## 2023-02-26 DIAGNOSIS — N2581 Secondary hyperparathyroidism of renal origin: Secondary | ICD-10-CM | POA: Diagnosis not present

## 2023-02-26 DIAGNOSIS — D509 Iron deficiency anemia, unspecified: Secondary | ICD-10-CM | POA: Diagnosis not present

## 2023-02-26 DIAGNOSIS — Z992 Dependence on renal dialysis: Secondary | ICD-10-CM | POA: Diagnosis not present

## 2023-02-26 DIAGNOSIS — D631 Anemia in chronic kidney disease: Secondary | ICD-10-CM | POA: Diagnosis not present

## 2023-02-26 DIAGNOSIS — N186 End stage renal disease: Secondary | ICD-10-CM | POA: Diagnosis not present

## 2023-02-27 DIAGNOSIS — N186 End stage renal disease: Secondary | ICD-10-CM | POA: Diagnosis not present

## 2023-02-27 DIAGNOSIS — N2581 Secondary hyperparathyroidism of renal origin: Secondary | ICD-10-CM | POA: Diagnosis not present

## 2023-02-27 DIAGNOSIS — D631 Anemia in chronic kidney disease: Secondary | ICD-10-CM | POA: Diagnosis not present

## 2023-02-27 DIAGNOSIS — Z4932 Encounter for adequacy testing for peritoneal dialysis: Secondary | ICD-10-CM | POA: Diagnosis not present

## 2023-02-27 DIAGNOSIS — K769 Liver disease, unspecified: Secondary | ICD-10-CM | POA: Diagnosis not present

## 2023-02-27 DIAGNOSIS — D509 Iron deficiency anemia, unspecified: Secondary | ICD-10-CM | POA: Diagnosis not present

## 2023-02-27 DIAGNOSIS — Z992 Dependence on renal dialysis: Secondary | ICD-10-CM | POA: Diagnosis not present

## 2023-02-28 DIAGNOSIS — D509 Iron deficiency anemia, unspecified: Secondary | ICD-10-CM | POA: Diagnosis not present

## 2023-02-28 DIAGNOSIS — Z992 Dependence on renal dialysis: Secondary | ICD-10-CM | POA: Diagnosis not present

## 2023-02-28 DIAGNOSIS — K769 Liver disease, unspecified: Secondary | ICD-10-CM | POA: Diagnosis not present

## 2023-02-28 DIAGNOSIS — D631 Anemia in chronic kidney disease: Secondary | ICD-10-CM | POA: Diagnosis not present

## 2023-02-28 DIAGNOSIS — Z4932 Encounter for adequacy testing for peritoneal dialysis: Secondary | ICD-10-CM | POA: Diagnosis not present

## 2023-02-28 DIAGNOSIS — N2581 Secondary hyperparathyroidism of renal origin: Secondary | ICD-10-CM | POA: Diagnosis not present

## 2023-02-28 DIAGNOSIS — N186 End stage renal disease: Secondary | ICD-10-CM | POA: Diagnosis not present

## 2023-03-01 DIAGNOSIS — D631 Anemia in chronic kidney disease: Secondary | ICD-10-CM | POA: Diagnosis not present

## 2023-03-01 DIAGNOSIS — D509 Iron deficiency anemia, unspecified: Secondary | ICD-10-CM | POA: Diagnosis not present

## 2023-03-01 DIAGNOSIS — N2581 Secondary hyperparathyroidism of renal origin: Secondary | ICD-10-CM | POA: Diagnosis not present

## 2023-03-01 DIAGNOSIS — K769 Liver disease, unspecified: Secondary | ICD-10-CM | POA: Diagnosis not present

## 2023-03-01 DIAGNOSIS — N186 End stage renal disease: Secondary | ICD-10-CM | POA: Diagnosis not present

## 2023-03-01 DIAGNOSIS — Z4932 Encounter for adequacy testing for peritoneal dialysis: Secondary | ICD-10-CM | POA: Diagnosis not present

## 2023-03-01 DIAGNOSIS — Z992 Dependence on renal dialysis: Secondary | ICD-10-CM | POA: Diagnosis not present

## 2023-03-02 DIAGNOSIS — K769 Liver disease, unspecified: Secondary | ICD-10-CM | POA: Diagnosis not present

## 2023-03-02 DIAGNOSIS — N186 End stage renal disease: Secondary | ICD-10-CM | POA: Diagnosis not present

## 2023-03-02 DIAGNOSIS — D631 Anemia in chronic kidney disease: Secondary | ICD-10-CM | POA: Diagnosis not present

## 2023-03-02 DIAGNOSIS — D509 Iron deficiency anemia, unspecified: Secondary | ICD-10-CM | POA: Diagnosis not present

## 2023-03-02 DIAGNOSIS — Z992 Dependence on renal dialysis: Secondary | ICD-10-CM | POA: Diagnosis not present

## 2023-03-02 DIAGNOSIS — N2581 Secondary hyperparathyroidism of renal origin: Secondary | ICD-10-CM | POA: Diagnosis not present

## 2023-03-02 DIAGNOSIS — Z4932 Encounter for adequacy testing for peritoneal dialysis: Secondary | ICD-10-CM | POA: Diagnosis not present

## 2023-03-03 DIAGNOSIS — Z992 Dependence on renal dialysis: Secondary | ICD-10-CM | POA: Diagnosis not present

## 2023-03-03 DIAGNOSIS — D631 Anemia in chronic kidney disease: Secondary | ICD-10-CM | POA: Diagnosis not present

## 2023-03-03 DIAGNOSIS — D509 Iron deficiency anemia, unspecified: Secondary | ICD-10-CM | POA: Diagnosis not present

## 2023-03-03 DIAGNOSIS — N186 End stage renal disease: Secondary | ICD-10-CM | POA: Diagnosis not present

## 2023-03-03 DIAGNOSIS — N2581 Secondary hyperparathyroidism of renal origin: Secondary | ICD-10-CM | POA: Diagnosis not present

## 2023-03-03 DIAGNOSIS — K769 Liver disease, unspecified: Secondary | ICD-10-CM | POA: Diagnosis not present

## 2023-03-03 DIAGNOSIS — Z4932 Encounter for adequacy testing for peritoneal dialysis: Secondary | ICD-10-CM | POA: Diagnosis not present

## 2023-03-04 DIAGNOSIS — Z4932 Encounter for adequacy testing for peritoneal dialysis: Secondary | ICD-10-CM | POA: Diagnosis not present

## 2023-03-04 DIAGNOSIS — I120 Hypertensive chronic kidney disease with stage 5 chronic kidney disease or end stage renal disease: Secondary | ICD-10-CM | POA: Diagnosis not present

## 2023-03-04 DIAGNOSIS — K769 Liver disease, unspecified: Secondary | ICD-10-CM | POA: Diagnosis not present

## 2023-03-04 DIAGNOSIS — K4020 Bilateral inguinal hernia, without obstruction or gangrene, not specified as recurrent: Secondary | ICD-10-CM | POA: Diagnosis not present

## 2023-03-04 DIAGNOSIS — N186 End stage renal disease: Secondary | ICD-10-CM | POA: Diagnosis not present

## 2023-03-04 DIAGNOSIS — N2581 Secondary hyperparathyroidism of renal origin: Secondary | ICD-10-CM | POA: Diagnosis not present

## 2023-03-04 DIAGNOSIS — D631 Anemia in chronic kidney disease: Secondary | ICD-10-CM | POA: Diagnosis not present

## 2023-03-04 DIAGNOSIS — D509 Iron deficiency anemia, unspecified: Secondary | ICD-10-CM | POA: Diagnosis not present

## 2023-03-04 DIAGNOSIS — Z992 Dependence on renal dialysis: Secondary | ICD-10-CM | POA: Diagnosis not present

## 2023-03-05 DIAGNOSIS — Z992 Dependence on renal dialysis: Secondary | ICD-10-CM | POA: Diagnosis not present

## 2023-03-05 DIAGNOSIS — Z4932 Encounter for adequacy testing for peritoneal dialysis: Secondary | ICD-10-CM | POA: Diagnosis not present

## 2023-03-05 DIAGNOSIS — D631 Anemia in chronic kidney disease: Secondary | ICD-10-CM | POA: Diagnosis not present

## 2023-03-05 DIAGNOSIS — N2581 Secondary hyperparathyroidism of renal origin: Secondary | ICD-10-CM | POA: Diagnosis not present

## 2023-03-05 DIAGNOSIS — N186 End stage renal disease: Secondary | ICD-10-CM | POA: Diagnosis not present

## 2023-03-05 DIAGNOSIS — K769 Liver disease, unspecified: Secondary | ICD-10-CM | POA: Diagnosis not present

## 2023-03-05 DIAGNOSIS — D509 Iron deficiency anemia, unspecified: Secondary | ICD-10-CM | POA: Diagnosis not present

## 2023-03-06 DIAGNOSIS — N2581 Secondary hyperparathyroidism of renal origin: Secondary | ICD-10-CM | POA: Diagnosis not present

## 2023-03-06 DIAGNOSIS — Z992 Dependence on renal dialysis: Secondary | ICD-10-CM | POA: Diagnosis not present

## 2023-03-06 DIAGNOSIS — N186 End stage renal disease: Secondary | ICD-10-CM | POA: Diagnosis not present

## 2023-03-06 DIAGNOSIS — D509 Iron deficiency anemia, unspecified: Secondary | ICD-10-CM | POA: Diagnosis not present

## 2023-03-06 DIAGNOSIS — K769 Liver disease, unspecified: Secondary | ICD-10-CM | POA: Diagnosis not present

## 2023-03-06 DIAGNOSIS — Z4932 Encounter for adequacy testing for peritoneal dialysis: Secondary | ICD-10-CM | POA: Diagnosis not present

## 2023-03-06 DIAGNOSIS — D631 Anemia in chronic kidney disease: Secondary | ICD-10-CM | POA: Diagnosis not present

## 2023-03-07 DIAGNOSIS — Z4932 Encounter for adequacy testing for peritoneal dialysis: Secondary | ICD-10-CM | POA: Diagnosis not present

## 2023-03-07 DIAGNOSIS — D509 Iron deficiency anemia, unspecified: Secondary | ICD-10-CM | POA: Diagnosis not present

## 2023-03-07 DIAGNOSIS — N2581 Secondary hyperparathyroidism of renal origin: Secondary | ICD-10-CM | POA: Diagnosis not present

## 2023-03-07 DIAGNOSIS — K769 Liver disease, unspecified: Secondary | ICD-10-CM | POA: Diagnosis not present

## 2023-03-07 DIAGNOSIS — N186 End stage renal disease: Secondary | ICD-10-CM | POA: Diagnosis not present

## 2023-03-07 DIAGNOSIS — Z992 Dependence on renal dialysis: Secondary | ICD-10-CM | POA: Diagnosis not present

## 2023-03-07 DIAGNOSIS — D631 Anemia in chronic kidney disease: Secondary | ICD-10-CM | POA: Diagnosis not present

## 2023-03-08 DIAGNOSIS — N186 End stage renal disease: Secondary | ICD-10-CM | POA: Diagnosis not present

## 2023-03-08 DIAGNOSIS — Z4932 Encounter for adequacy testing for peritoneal dialysis: Secondary | ICD-10-CM | POA: Diagnosis not present

## 2023-03-08 DIAGNOSIS — N2581 Secondary hyperparathyroidism of renal origin: Secondary | ICD-10-CM | POA: Diagnosis not present

## 2023-03-08 DIAGNOSIS — K769 Liver disease, unspecified: Secondary | ICD-10-CM | POA: Diagnosis not present

## 2023-03-08 DIAGNOSIS — D509 Iron deficiency anemia, unspecified: Secondary | ICD-10-CM | POA: Diagnosis not present

## 2023-03-08 DIAGNOSIS — Z992 Dependence on renal dialysis: Secondary | ICD-10-CM | POA: Diagnosis not present

## 2023-03-08 DIAGNOSIS — D631 Anemia in chronic kidney disease: Secondary | ICD-10-CM | POA: Diagnosis not present

## 2023-03-09 DIAGNOSIS — D509 Iron deficiency anemia, unspecified: Secondary | ICD-10-CM | POA: Diagnosis not present

## 2023-03-09 DIAGNOSIS — D631 Anemia in chronic kidney disease: Secondary | ICD-10-CM | POA: Diagnosis not present

## 2023-03-09 DIAGNOSIS — Z992 Dependence on renal dialysis: Secondary | ICD-10-CM | POA: Diagnosis not present

## 2023-03-09 DIAGNOSIS — N2581 Secondary hyperparathyroidism of renal origin: Secondary | ICD-10-CM | POA: Diagnosis not present

## 2023-03-09 DIAGNOSIS — N186 End stage renal disease: Secondary | ICD-10-CM | POA: Diagnosis not present

## 2023-03-09 DIAGNOSIS — K769 Liver disease, unspecified: Secondary | ICD-10-CM | POA: Diagnosis not present

## 2023-03-09 DIAGNOSIS — Z4932 Encounter for adequacy testing for peritoneal dialysis: Secondary | ICD-10-CM | POA: Diagnosis not present

## 2023-03-10 DIAGNOSIS — Z992 Dependence on renal dialysis: Secondary | ICD-10-CM | POA: Diagnosis not present

## 2023-03-10 DIAGNOSIS — N2581 Secondary hyperparathyroidism of renal origin: Secondary | ICD-10-CM | POA: Diagnosis not present

## 2023-03-10 DIAGNOSIS — D631 Anemia in chronic kidney disease: Secondary | ICD-10-CM | POA: Diagnosis not present

## 2023-03-10 DIAGNOSIS — Z4932 Encounter for adequacy testing for peritoneal dialysis: Secondary | ICD-10-CM | POA: Diagnosis not present

## 2023-03-10 DIAGNOSIS — N186 End stage renal disease: Secondary | ICD-10-CM | POA: Diagnosis not present

## 2023-03-10 DIAGNOSIS — K769 Liver disease, unspecified: Secondary | ICD-10-CM | POA: Diagnosis not present

## 2023-03-10 DIAGNOSIS — D509 Iron deficiency anemia, unspecified: Secondary | ICD-10-CM | POA: Diagnosis not present

## 2023-03-11 DIAGNOSIS — K769 Liver disease, unspecified: Secondary | ICD-10-CM | POA: Diagnosis not present

## 2023-03-11 DIAGNOSIS — N2581 Secondary hyperparathyroidism of renal origin: Secondary | ICD-10-CM | POA: Diagnosis not present

## 2023-03-11 DIAGNOSIS — Z4932 Encounter for adequacy testing for peritoneal dialysis: Secondary | ICD-10-CM | POA: Diagnosis not present

## 2023-03-11 DIAGNOSIS — N186 End stage renal disease: Secondary | ICD-10-CM | POA: Diagnosis not present

## 2023-03-11 DIAGNOSIS — Z992 Dependence on renal dialysis: Secondary | ICD-10-CM | POA: Diagnosis not present

## 2023-03-11 DIAGNOSIS — D631 Anemia in chronic kidney disease: Secondary | ICD-10-CM | POA: Diagnosis not present

## 2023-03-11 DIAGNOSIS — D509 Iron deficiency anemia, unspecified: Secondary | ICD-10-CM | POA: Diagnosis not present

## 2023-03-12 DIAGNOSIS — N2581 Secondary hyperparathyroidism of renal origin: Secondary | ICD-10-CM | POA: Diagnosis not present

## 2023-03-12 DIAGNOSIS — I129 Hypertensive chronic kidney disease with stage 1 through stage 4 chronic kidney disease, or unspecified chronic kidney disease: Secondary | ICD-10-CM | POA: Diagnosis not present

## 2023-03-12 DIAGNOSIS — K769 Liver disease, unspecified: Secondary | ICD-10-CM | POA: Diagnosis not present

## 2023-03-12 DIAGNOSIS — Z4932 Encounter for adequacy testing for peritoneal dialysis: Secondary | ICD-10-CM | POA: Diagnosis not present

## 2023-03-12 DIAGNOSIS — Z992 Dependence on renal dialysis: Secondary | ICD-10-CM | POA: Diagnosis not present

## 2023-03-12 DIAGNOSIS — D631 Anemia in chronic kidney disease: Secondary | ICD-10-CM | POA: Diagnosis not present

## 2023-03-12 DIAGNOSIS — D509 Iron deficiency anemia, unspecified: Secondary | ICD-10-CM | POA: Diagnosis not present

## 2023-03-12 DIAGNOSIS — N186 End stage renal disease: Secondary | ICD-10-CM | POA: Diagnosis not present

## 2023-03-13 DIAGNOSIS — K769 Liver disease, unspecified: Secondary | ICD-10-CM | POA: Diagnosis not present

## 2023-03-13 DIAGNOSIS — N2581 Secondary hyperparathyroidism of renal origin: Secondary | ICD-10-CM | POA: Diagnosis not present

## 2023-03-13 DIAGNOSIS — N186 End stage renal disease: Secondary | ICD-10-CM | POA: Diagnosis not present

## 2023-03-13 DIAGNOSIS — D631 Anemia in chronic kidney disease: Secondary | ICD-10-CM | POA: Diagnosis not present

## 2023-03-13 DIAGNOSIS — Z992 Dependence on renal dialysis: Secondary | ICD-10-CM | POA: Diagnosis not present

## 2023-03-13 DIAGNOSIS — Z4932 Encounter for adequacy testing for peritoneal dialysis: Secondary | ICD-10-CM | POA: Diagnosis not present

## 2023-03-14 DIAGNOSIS — N2581 Secondary hyperparathyroidism of renal origin: Secondary | ICD-10-CM | POA: Diagnosis not present

## 2023-03-14 DIAGNOSIS — D631 Anemia in chronic kidney disease: Secondary | ICD-10-CM | POA: Diagnosis not present

## 2023-03-14 DIAGNOSIS — N186 End stage renal disease: Secondary | ICD-10-CM | POA: Diagnosis not present

## 2023-03-14 DIAGNOSIS — Z4932 Encounter for adequacy testing for peritoneal dialysis: Secondary | ICD-10-CM | POA: Diagnosis not present

## 2023-03-14 DIAGNOSIS — Z992 Dependence on renal dialysis: Secondary | ICD-10-CM | POA: Diagnosis not present

## 2023-03-14 DIAGNOSIS — K769 Liver disease, unspecified: Secondary | ICD-10-CM | POA: Diagnosis not present

## 2023-03-15 DIAGNOSIS — K769 Liver disease, unspecified: Secondary | ICD-10-CM | POA: Diagnosis not present

## 2023-03-15 DIAGNOSIS — N2581 Secondary hyperparathyroidism of renal origin: Secondary | ICD-10-CM | POA: Diagnosis not present

## 2023-03-15 DIAGNOSIS — Z992 Dependence on renal dialysis: Secondary | ICD-10-CM | POA: Diagnosis not present

## 2023-03-15 DIAGNOSIS — Z4932 Encounter for adequacy testing for peritoneal dialysis: Secondary | ICD-10-CM | POA: Diagnosis not present

## 2023-03-15 DIAGNOSIS — N186 End stage renal disease: Secondary | ICD-10-CM | POA: Diagnosis not present

## 2023-03-15 DIAGNOSIS — D631 Anemia in chronic kidney disease: Secondary | ICD-10-CM | POA: Diagnosis not present

## 2023-03-16 DIAGNOSIS — Z4932 Encounter for adequacy testing for peritoneal dialysis: Secondary | ICD-10-CM | POA: Diagnosis not present

## 2023-03-16 DIAGNOSIS — N2581 Secondary hyperparathyroidism of renal origin: Secondary | ICD-10-CM | POA: Diagnosis not present

## 2023-03-16 DIAGNOSIS — N186 End stage renal disease: Secondary | ICD-10-CM | POA: Diagnosis not present

## 2023-03-16 DIAGNOSIS — D631 Anemia in chronic kidney disease: Secondary | ICD-10-CM | POA: Diagnosis not present

## 2023-03-16 DIAGNOSIS — Z992 Dependence on renal dialysis: Secondary | ICD-10-CM | POA: Diagnosis not present

## 2023-03-16 DIAGNOSIS — K769 Liver disease, unspecified: Secondary | ICD-10-CM | POA: Diagnosis not present

## 2023-03-17 DIAGNOSIS — K769 Liver disease, unspecified: Secondary | ICD-10-CM | POA: Diagnosis not present

## 2023-03-17 DIAGNOSIS — N186 End stage renal disease: Secondary | ICD-10-CM | POA: Diagnosis not present

## 2023-03-17 DIAGNOSIS — N2581 Secondary hyperparathyroidism of renal origin: Secondary | ICD-10-CM | POA: Diagnosis not present

## 2023-03-17 DIAGNOSIS — Z992 Dependence on renal dialysis: Secondary | ICD-10-CM | POA: Diagnosis not present

## 2023-03-17 DIAGNOSIS — Z4932 Encounter for adequacy testing for peritoneal dialysis: Secondary | ICD-10-CM | POA: Diagnosis not present

## 2023-03-17 DIAGNOSIS — D631 Anemia in chronic kidney disease: Secondary | ICD-10-CM | POA: Diagnosis not present

## 2023-03-18 DIAGNOSIS — N2581 Secondary hyperparathyroidism of renal origin: Secondary | ICD-10-CM | POA: Diagnosis not present

## 2023-03-18 DIAGNOSIS — D631 Anemia in chronic kidney disease: Secondary | ICD-10-CM | POA: Diagnosis not present

## 2023-03-18 DIAGNOSIS — Z992 Dependence on renal dialysis: Secondary | ICD-10-CM | POA: Diagnosis not present

## 2023-03-18 DIAGNOSIS — N186 End stage renal disease: Secondary | ICD-10-CM | POA: Diagnosis not present

## 2023-03-18 DIAGNOSIS — K769 Liver disease, unspecified: Secondary | ICD-10-CM | POA: Diagnosis not present

## 2023-03-18 DIAGNOSIS — Z4932 Encounter for adequacy testing for peritoneal dialysis: Secondary | ICD-10-CM | POA: Diagnosis not present

## 2023-03-19 DIAGNOSIS — N2581 Secondary hyperparathyroidism of renal origin: Secondary | ICD-10-CM | POA: Diagnosis not present

## 2023-03-19 DIAGNOSIS — Z992 Dependence on renal dialysis: Secondary | ICD-10-CM | POA: Diagnosis not present

## 2023-03-19 DIAGNOSIS — D631 Anemia in chronic kidney disease: Secondary | ICD-10-CM | POA: Diagnosis not present

## 2023-03-19 DIAGNOSIS — Z4932 Encounter for adequacy testing for peritoneal dialysis: Secondary | ICD-10-CM | POA: Diagnosis not present

## 2023-03-19 DIAGNOSIS — K769 Liver disease, unspecified: Secondary | ICD-10-CM | POA: Diagnosis not present

## 2023-03-19 DIAGNOSIS — N186 End stage renal disease: Secondary | ICD-10-CM | POA: Diagnosis not present

## 2023-03-20 DIAGNOSIS — N186 End stage renal disease: Secondary | ICD-10-CM | POA: Diagnosis not present

## 2023-03-20 DIAGNOSIS — Z4932 Encounter for adequacy testing for peritoneal dialysis: Secondary | ICD-10-CM | POA: Diagnosis not present

## 2023-03-20 DIAGNOSIS — N2581 Secondary hyperparathyroidism of renal origin: Secondary | ICD-10-CM | POA: Diagnosis not present

## 2023-03-20 DIAGNOSIS — D631 Anemia in chronic kidney disease: Secondary | ICD-10-CM | POA: Diagnosis not present

## 2023-03-20 DIAGNOSIS — Z992 Dependence on renal dialysis: Secondary | ICD-10-CM | POA: Diagnosis not present

## 2023-03-20 DIAGNOSIS — K769 Liver disease, unspecified: Secondary | ICD-10-CM | POA: Diagnosis not present

## 2023-03-21 DIAGNOSIS — D631 Anemia in chronic kidney disease: Secondary | ICD-10-CM | POA: Diagnosis not present

## 2023-03-21 DIAGNOSIS — N186 End stage renal disease: Secondary | ICD-10-CM | POA: Diagnosis not present

## 2023-03-21 DIAGNOSIS — Z4932 Encounter for adequacy testing for peritoneal dialysis: Secondary | ICD-10-CM | POA: Diagnosis not present

## 2023-03-21 DIAGNOSIS — K769 Liver disease, unspecified: Secondary | ICD-10-CM | POA: Diagnosis not present

## 2023-03-21 DIAGNOSIS — N2581 Secondary hyperparathyroidism of renal origin: Secondary | ICD-10-CM | POA: Diagnosis not present

## 2023-03-21 DIAGNOSIS — Z992 Dependence on renal dialysis: Secondary | ICD-10-CM | POA: Diagnosis not present

## 2023-03-22 DIAGNOSIS — N186 End stage renal disease: Secondary | ICD-10-CM | POA: Diagnosis not present

## 2023-03-22 DIAGNOSIS — Z992 Dependence on renal dialysis: Secondary | ICD-10-CM | POA: Diagnosis not present

## 2023-03-22 DIAGNOSIS — Z4932 Encounter for adequacy testing for peritoneal dialysis: Secondary | ICD-10-CM | POA: Diagnosis not present

## 2023-03-22 DIAGNOSIS — N2581 Secondary hyperparathyroidism of renal origin: Secondary | ICD-10-CM | POA: Diagnosis not present

## 2023-03-22 DIAGNOSIS — D631 Anemia in chronic kidney disease: Secondary | ICD-10-CM | POA: Diagnosis not present

## 2023-03-22 DIAGNOSIS — K769 Liver disease, unspecified: Secondary | ICD-10-CM | POA: Diagnosis not present

## 2023-03-23 DIAGNOSIS — Z992 Dependence on renal dialysis: Secondary | ICD-10-CM | POA: Diagnosis not present

## 2023-03-23 DIAGNOSIS — Z4932 Encounter for adequacy testing for peritoneal dialysis: Secondary | ICD-10-CM | POA: Diagnosis not present

## 2023-03-23 DIAGNOSIS — D631 Anemia in chronic kidney disease: Secondary | ICD-10-CM | POA: Diagnosis not present

## 2023-03-23 DIAGNOSIS — K769 Liver disease, unspecified: Secondary | ICD-10-CM | POA: Diagnosis not present

## 2023-03-23 DIAGNOSIS — N2581 Secondary hyperparathyroidism of renal origin: Secondary | ICD-10-CM | POA: Diagnosis not present

## 2023-03-23 DIAGNOSIS — N186 End stage renal disease: Secondary | ICD-10-CM | POA: Diagnosis not present

## 2023-03-24 DIAGNOSIS — N186 End stage renal disease: Secondary | ICD-10-CM | POA: Diagnosis not present

## 2023-03-24 DIAGNOSIS — Z992 Dependence on renal dialysis: Secondary | ICD-10-CM | POA: Diagnosis not present

## 2023-03-24 DIAGNOSIS — Z4932 Encounter for adequacy testing for peritoneal dialysis: Secondary | ICD-10-CM | POA: Diagnosis not present

## 2023-03-24 DIAGNOSIS — N2581 Secondary hyperparathyroidism of renal origin: Secondary | ICD-10-CM | POA: Diagnosis not present

## 2023-03-24 DIAGNOSIS — D631 Anemia in chronic kidney disease: Secondary | ICD-10-CM | POA: Diagnosis not present

## 2023-03-24 DIAGNOSIS — K769 Liver disease, unspecified: Secondary | ICD-10-CM | POA: Diagnosis not present

## 2023-03-25 DIAGNOSIS — Z4932 Encounter for adequacy testing for peritoneal dialysis: Secondary | ICD-10-CM | POA: Diagnosis not present

## 2023-03-25 DIAGNOSIS — K769 Liver disease, unspecified: Secondary | ICD-10-CM | POA: Diagnosis not present

## 2023-03-25 DIAGNOSIS — Z992 Dependence on renal dialysis: Secondary | ICD-10-CM | POA: Diagnosis not present

## 2023-03-25 DIAGNOSIS — N2581 Secondary hyperparathyroidism of renal origin: Secondary | ICD-10-CM | POA: Diagnosis not present

## 2023-03-25 DIAGNOSIS — N186 End stage renal disease: Secondary | ICD-10-CM | POA: Diagnosis not present

## 2023-03-25 DIAGNOSIS — D631 Anemia in chronic kidney disease: Secondary | ICD-10-CM | POA: Diagnosis not present

## 2023-03-26 DIAGNOSIS — Z4932 Encounter for adequacy testing for peritoneal dialysis: Secondary | ICD-10-CM | POA: Diagnosis not present

## 2023-03-26 DIAGNOSIS — D631 Anemia in chronic kidney disease: Secondary | ICD-10-CM | POA: Diagnosis not present

## 2023-03-26 DIAGNOSIS — N186 End stage renal disease: Secondary | ICD-10-CM | POA: Diagnosis not present

## 2023-03-26 DIAGNOSIS — K769 Liver disease, unspecified: Secondary | ICD-10-CM | POA: Diagnosis not present

## 2023-03-26 DIAGNOSIS — Z992 Dependence on renal dialysis: Secondary | ICD-10-CM | POA: Diagnosis not present

## 2023-03-26 DIAGNOSIS — N2581 Secondary hyperparathyroidism of renal origin: Secondary | ICD-10-CM | POA: Diagnosis not present

## 2023-03-27 DIAGNOSIS — N2581 Secondary hyperparathyroidism of renal origin: Secondary | ICD-10-CM | POA: Diagnosis not present

## 2023-03-27 DIAGNOSIS — N186 End stage renal disease: Secondary | ICD-10-CM | POA: Diagnosis not present

## 2023-03-27 DIAGNOSIS — Z4932 Encounter for adequacy testing for peritoneal dialysis: Secondary | ICD-10-CM | POA: Diagnosis not present

## 2023-03-27 DIAGNOSIS — Z992 Dependence on renal dialysis: Secondary | ICD-10-CM | POA: Diagnosis not present

## 2023-03-27 DIAGNOSIS — K769 Liver disease, unspecified: Secondary | ICD-10-CM | POA: Diagnosis not present

## 2023-03-27 DIAGNOSIS — D631 Anemia in chronic kidney disease: Secondary | ICD-10-CM | POA: Diagnosis not present

## 2023-03-28 DIAGNOSIS — Z992 Dependence on renal dialysis: Secondary | ICD-10-CM | POA: Diagnosis not present

## 2023-03-28 DIAGNOSIS — N186 End stage renal disease: Secondary | ICD-10-CM | POA: Diagnosis not present

## 2023-03-28 DIAGNOSIS — Z4932 Encounter for adequacy testing for peritoneal dialysis: Secondary | ICD-10-CM | POA: Diagnosis not present

## 2023-03-28 DIAGNOSIS — D631 Anemia in chronic kidney disease: Secondary | ICD-10-CM | POA: Diagnosis not present

## 2023-03-28 DIAGNOSIS — K769 Liver disease, unspecified: Secondary | ICD-10-CM | POA: Diagnosis not present

## 2023-03-28 DIAGNOSIS — N2581 Secondary hyperparathyroidism of renal origin: Secondary | ICD-10-CM | POA: Diagnosis not present

## 2023-03-29 DIAGNOSIS — Z992 Dependence on renal dialysis: Secondary | ICD-10-CM | POA: Diagnosis not present

## 2023-03-29 DIAGNOSIS — N2581 Secondary hyperparathyroidism of renal origin: Secondary | ICD-10-CM | POA: Diagnosis not present

## 2023-03-29 DIAGNOSIS — Z4932 Encounter for adequacy testing for peritoneal dialysis: Secondary | ICD-10-CM | POA: Diagnosis not present

## 2023-03-29 DIAGNOSIS — K769 Liver disease, unspecified: Secondary | ICD-10-CM | POA: Diagnosis not present

## 2023-03-29 DIAGNOSIS — D631 Anemia in chronic kidney disease: Secondary | ICD-10-CM | POA: Diagnosis not present

## 2023-03-29 DIAGNOSIS — N186 End stage renal disease: Secondary | ICD-10-CM | POA: Diagnosis not present

## 2023-03-30 DIAGNOSIS — N2581 Secondary hyperparathyroidism of renal origin: Secondary | ICD-10-CM | POA: Diagnosis not present

## 2023-03-30 DIAGNOSIS — Z4932 Encounter for adequacy testing for peritoneal dialysis: Secondary | ICD-10-CM | POA: Diagnosis not present

## 2023-03-30 DIAGNOSIS — Z992 Dependence on renal dialysis: Secondary | ICD-10-CM | POA: Diagnosis not present

## 2023-03-30 DIAGNOSIS — D631 Anemia in chronic kidney disease: Secondary | ICD-10-CM | POA: Diagnosis not present

## 2023-03-30 DIAGNOSIS — K769 Liver disease, unspecified: Secondary | ICD-10-CM | POA: Diagnosis not present

## 2023-03-30 DIAGNOSIS — N186 End stage renal disease: Secondary | ICD-10-CM | POA: Diagnosis not present

## 2023-03-31 DIAGNOSIS — Z992 Dependence on renal dialysis: Secondary | ICD-10-CM | POA: Diagnosis not present

## 2023-03-31 DIAGNOSIS — D631 Anemia in chronic kidney disease: Secondary | ICD-10-CM | POA: Diagnosis not present

## 2023-03-31 DIAGNOSIS — N186 End stage renal disease: Secondary | ICD-10-CM | POA: Diagnosis not present

## 2023-03-31 DIAGNOSIS — N2581 Secondary hyperparathyroidism of renal origin: Secondary | ICD-10-CM | POA: Diagnosis not present

## 2023-03-31 DIAGNOSIS — K769 Liver disease, unspecified: Secondary | ICD-10-CM | POA: Diagnosis not present

## 2023-03-31 DIAGNOSIS — Z4932 Encounter for adequacy testing for peritoneal dialysis: Secondary | ICD-10-CM | POA: Diagnosis not present

## 2023-04-01 DIAGNOSIS — N2581 Secondary hyperparathyroidism of renal origin: Secondary | ICD-10-CM | POA: Diagnosis not present

## 2023-04-01 DIAGNOSIS — Z4932 Encounter for adequacy testing for peritoneal dialysis: Secondary | ICD-10-CM | POA: Diagnosis not present

## 2023-04-01 DIAGNOSIS — D631 Anemia in chronic kidney disease: Secondary | ICD-10-CM | POA: Diagnosis not present

## 2023-04-01 DIAGNOSIS — K769 Liver disease, unspecified: Secondary | ICD-10-CM | POA: Diagnosis not present

## 2023-04-01 DIAGNOSIS — Z992 Dependence on renal dialysis: Secondary | ICD-10-CM | POA: Diagnosis not present

## 2023-04-01 DIAGNOSIS — N186 End stage renal disease: Secondary | ICD-10-CM | POA: Diagnosis not present

## 2023-04-02 DIAGNOSIS — Z992 Dependence on renal dialysis: Secondary | ICD-10-CM | POA: Diagnosis not present

## 2023-04-02 DIAGNOSIS — Z4932 Encounter for adequacy testing for peritoneal dialysis: Secondary | ICD-10-CM | POA: Diagnosis not present

## 2023-04-02 DIAGNOSIS — K769 Liver disease, unspecified: Secondary | ICD-10-CM | POA: Diagnosis not present

## 2023-04-02 DIAGNOSIS — N186 End stage renal disease: Secondary | ICD-10-CM | POA: Diagnosis not present

## 2023-04-02 DIAGNOSIS — D631 Anemia in chronic kidney disease: Secondary | ICD-10-CM | POA: Diagnosis not present

## 2023-04-02 DIAGNOSIS — N2581 Secondary hyperparathyroidism of renal origin: Secondary | ICD-10-CM | POA: Diagnosis not present

## 2023-04-03 DIAGNOSIS — Z992 Dependence on renal dialysis: Secondary | ICD-10-CM | POA: Diagnosis not present

## 2023-04-03 DIAGNOSIS — N186 End stage renal disease: Secondary | ICD-10-CM | POA: Diagnosis not present

## 2023-04-03 DIAGNOSIS — K769 Liver disease, unspecified: Secondary | ICD-10-CM | POA: Diagnosis not present

## 2023-04-03 DIAGNOSIS — Z4932 Encounter for adequacy testing for peritoneal dialysis: Secondary | ICD-10-CM | POA: Diagnosis not present

## 2023-04-03 DIAGNOSIS — D631 Anemia in chronic kidney disease: Secondary | ICD-10-CM | POA: Diagnosis not present

## 2023-04-03 DIAGNOSIS — N2581 Secondary hyperparathyroidism of renal origin: Secondary | ICD-10-CM | POA: Diagnosis not present

## 2023-04-04 DIAGNOSIS — K769 Liver disease, unspecified: Secondary | ICD-10-CM | POA: Diagnosis not present

## 2023-04-04 DIAGNOSIS — Z4932 Encounter for adequacy testing for peritoneal dialysis: Secondary | ICD-10-CM | POA: Diagnosis not present

## 2023-04-04 DIAGNOSIS — D631 Anemia in chronic kidney disease: Secondary | ICD-10-CM | POA: Diagnosis not present

## 2023-04-04 DIAGNOSIS — N186 End stage renal disease: Secondary | ICD-10-CM | POA: Diagnosis not present

## 2023-04-04 DIAGNOSIS — N2581 Secondary hyperparathyroidism of renal origin: Secondary | ICD-10-CM | POA: Diagnosis not present

## 2023-04-04 DIAGNOSIS — Z992 Dependence on renal dialysis: Secondary | ICD-10-CM | POA: Diagnosis not present

## 2023-04-05 DIAGNOSIS — D631 Anemia in chronic kidney disease: Secondary | ICD-10-CM | POA: Diagnosis not present

## 2023-04-05 DIAGNOSIS — K769 Liver disease, unspecified: Secondary | ICD-10-CM | POA: Diagnosis not present

## 2023-04-05 DIAGNOSIS — Z4932 Encounter for adequacy testing for peritoneal dialysis: Secondary | ICD-10-CM | POA: Diagnosis not present

## 2023-04-05 DIAGNOSIS — N186 End stage renal disease: Secondary | ICD-10-CM | POA: Diagnosis not present

## 2023-04-05 DIAGNOSIS — Z992 Dependence on renal dialysis: Secondary | ICD-10-CM | POA: Diagnosis not present

## 2023-04-05 DIAGNOSIS — N2581 Secondary hyperparathyroidism of renal origin: Secondary | ICD-10-CM | POA: Diagnosis not present

## 2023-04-06 DIAGNOSIS — N2581 Secondary hyperparathyroidism of renal origin: Secondary | ICD-10-CM | POA: Diagnosis not present

## 2023-04-06 DIAGNOSIS — D631 Anemia in chronic kidney disease: Secondary | ICD-10-CM | POA: Diagnosis not present

## 2023-04-06 DIAGNOSIS — Z992 Dependence on renal dialysis: Secondary | ICD-10-CM | POA: Diagnosis not present

## 2023-04-06 DIAGNOSIS — Z4932 Encounter for adequacy testing for peritoneal dialysis: Secondary | ICD-10-CM | POA: Diagnosis not present

## 2023-04-06 DIAGNOSIS — N186 End stage renal disease: Secondary | ICD-10-CM | POA: Diagnosis not present

## 2023-04-06 DIAGNOSIS — K769 Liver disease, unspecified: Secondary | ICD-10-CM | POA: Diagnosis not present

## 2023-04-07 DIAGNOSIS — N2581 Secondary hyperparathyroidism of renal origin: Secondary | ICD-10-CM | POA: Diagnosis not present

## 2023-04-07 DIAGNOSIS — Z992 Dependence on renal dialysis: Secondary | ICD-10-CM | POA: Diagnosis not present

## 2023-04-07 DIAGNOSIS — D631 Anemia in chronic kidney disease: Secondary | ICD-10-CM | POA: Diagnosis not present

## 2023-04-07 DIAGNOSIS — N186 End stage renal disease: Secondary | ICD-10-CM | POA: Diagnosis not present

## 2023-04-07 DIAGNOSIS — Z4932 Encounter for adequacy testing for peritoneal dialysis: Secondary | ICD-10-CM | POA: Diagnosis not present

## 2023-04-07 DIAGNOSIS — K769 Liver disease, unspecified: Secondary | ICD-10-CM | POA: Diagnosis not present

## 2023-04-08 DIAGNOSIS — D631 Anemia in chronic kidney disease: Secondary | ICD-10-CM | POA: Diagnosis not present

## 2023-04-08 DIAGNOSIS — K769 Liver disease, unspecified: Secondary | ICD-10-CM | POA: Diagnosis not present

## 2023-04-08 DIAGNOSIS — N186 End stage renal disease: Secondary | ICD-10-CM | POA: Diagnosis not present

## 2023-04-08 DIAGNOSIS — N2581 Secondary hyperparathyroidism of renal origin: Secondary | ICD-10-CM | POA: Diagnosis not present

## 2023-04-08 DIAGNOSIS — Z992 Dependence on renal dialysis: Secondary | ICD-10-CM | POA: Diagnosis not present

## 2023-04-08 DIAGNOSIS — Z4932 Encounter for adequacy testing for peritoneal dialysis: Secondary | ICD-10-CM | POA: Diagnosis not present

## 2023-04-09 DIAGNOSIS — N186 End stage renal disease: Secondary | ICD-10-CM | POA: Diagnosis not present

## 2023-04-09 DIAGNOSIS — Z992 Dependence on renal dialysis: Secondary | ICD-10-CM | POA: Diagnosis not present

## 2023-04-09 DIAGNOSIS — N2581 Secondary hyperparathyroidism of renal origin: Secondary | ICD-10-CM | POA: Diagnosis not present

## 2023-04-09 DIAGNOSIS — K769 Liver disease, unspecified: Secondary | ICD-10-CM | POA: Diagnosis not present

## 2023-04-09 DIAGNOSIS — D631 Anemia in chronic kidney disease: Secondary | ICD-10-CM | POA: Diagnosis not present

## 2023-04-09 DIAGNOSIS — Z4932 Encounter for adequacy testing for peritoneal dialysis: Secondary | ICD-10-CM | POA: Diagnosis not present

## 2023-04-10 DIAGNOSIS — N186 End stage renal disease: Secondary | ICD-10-CM | POA: Diagnosis not present

## 2023-04-10 DIAGNOSIS — K769 Liver disease, unspecified: Secondary | ICD-10-CM | POA: Diagnosis not present

## 2023-04-10 DIAGNOSIS — N2581 Secondary hyperparathyroidism of renal origin: Secondary | ICD-10-CM | POA: Diagnosis not present

## 2023-04-10 DIAGNOSIS — Z4932 Encounter for adequacy testing for peritoneal dialysis: Secondary | ICD-10-CM | POA: Diagnosis not present

## 2023-04-10 DIAGNOSIS — Z992 Dependence on renal dialysis: Secondary | ICD-10-CM | POA: Diagnosis not present

## 2023-04-10 DIAGNOSIS — D631 Anemia in chronic kidney disease: Secondary | ICD-10-CM | POA: Diagnosis not present

## 2023-04-11 DIAGNOSIS — K769 Liver disease, unspecified: Secondary | ICD-10-CM | POA: Diagnosis not present

## 2023-04-11 DIAGNOSIS — Z992 Dependence on renal dialysis: Secondary | ICD-10-CM | POA: Diagnosis not present

## 2023-04-11 DIAGNOSIS — Z4932 Encounter for adequacy testing for peritoneal dialysis: Secondary | ICD-10-CM | POA: Diagnosis not present

## 2023-04-11 DIAGNOSIS — N186 End stage renal disease: Secondary | ICD-10-CM | POA: Diagnosis not present

## 2023-04-11 DIAGNOSIS — N2581 Secondary hyperparathyroidism of renal origin: Secondary | ICD-10-CM | POA: Diagnosis not present

## 2023-04-11 DIAGNOSIS — D631 Anemia in chronic kidney disease: Secondary | ICD-10-CM | POA: Diagnosis not present

## 2023-04-12 DIAGNOSIS — Z4932 Encounter for adequacy testing for peritoneal dialysis: Secondary | ICD-10-CM | POA: Diagnosis not present

## 2023-04-12 DIAGNOSIS — Z992 Dependence on renal dialysis: Secondary | ICD-10-CM | POA: Diagnosis not present

## 2023-04-12 DIAGNOSIS — D631 Anemia in chronic kidney disease: Secondary | ICD-10-CM | POA: Diagnosis not present

## 2023-04-12 DIAGNOSIS — I129 Hypertensive chronic kidney disease with stage 1 through stage 4 chronic kidney disease, or unspecified chronic kidney disease: Secondary | ICD-10-CM | POA: Diagnosis not present

## 2023-04-12 DIAGNOSIS — N2581 Secondary hyperparathyroidism of renal origin: Secondary | ICD-10-CM | POA: Diagnosis not present

## 2023-04-12 DIAGNOSIS — N186 End stage renal disease: Secondary | ICD-10-CM | POA: Diagnosis not present

## 2023-04-12 DIAGNOSIS — K769 Liver disease, unspecified: Secondary | ICD-10-CM | POA: Diagnosis not present

## 2023-04-13 DIAGNOSIS — K769 Liver disease, unspecified: Secondary | ICD-10-CM | POA: Diagnosis not present

## 2023-04-13 DIAGNOSIS — N2581 Secondary hyperparathyroidism of renal origin: Secondary | ICD-10-CM | POA: Diagnosis not present

## 2023-04-13 DIAGNOSIS — N186 End stage renal disease: Secondary | ICD-10-CM | POA: Diagnosis not present

## 2023-04-13 DIAGNOSIS — Z992 Dependence on renal dialysis: Secondary | ICD-10-CM | POA: Diagnosis not present

## 2023-04-13 DIAGNOSIS — D509 Iron deficiency anemia, unspecified: Secondary | ICD-10-CM | POA: Diagnosis not present

## 2023-04-13 DIAGNOSIS — D631 Anemia in chronic kidney disease: Secondary | ICD-10-CM | POA: Diagnosis not present

## 2023-04-13 DIAGNOSIS — Z4932 Encounter for adequacy testing for peritoneal dialysis: Secondary | ICD-10-CM | POA: Diagnosis not present

## 2023-04-14 DIAGNOSIS — N186 End stage renal disease: Secondary | ICD-10-CM | POA: Diagnosis not present

## 2023-04-14 DIAGNOSIS — K769 Liver disease, unspecified: Secondary | ICD-10-CM | POA: Diagnosis not present

## 2023-04-14 DIAGNOSIS — Z992 Dependence on renal dialysis: Secondary | ICD-10-CM | POA: Diagnosis not present

## 2023-04-14 DIAGNOSIS — D631 Anemia in chronic kidney disease: Secondary | ICD-10-CM | POA: Diagnosis not present

## 2023-04-14 DIAGNOSIS — N2581 Secondary hyperparathyroidism of renal origin: Secondary | ICD-10-CM | POA: Diagnosis not present

## 2023-04-14 DIAGNOSIS — Z4932 Encounter for adequacy testing for peritoneal dialysis: Secondary | ICD-10-CM | POA: Diagnosis not present

## 2023-04-14 DIAGNOSIS — D509 Iron deficiency anemia, unspecified: Secondary | ICD-10-CM | POA: Diagnosis not present

## 2023-04-15 DIAGNOSIS — K769 Liver disease, unspecified: Secondary | ICD-10-CM | POA: Diagnosis not present

## 2023-04-15 DIAGNOSIS — Z992 Dependence on renal dialysis: Secondary | ICD-10-CM | POA: Diagnosis not present

## 2023-04-15 DIAGNOSIS — D631 Anemia in chronic kidney disease: Secondary | ICD-10-CM | POA: Diagnosis not present

## 2023-04-15 DIAGNOSIS — N2581 Secondary hyperparathyroidism of renal origin: Secondary | ICD-10-CM | POA: Diagnosis not present

## 2023-04-15 DIAGNOSIS — D509 Iron deficiency anemia, unspecified: Secondary | ICD-10-CM | POA: Diagnosis not present

## 2023-04-15 DIAGNOSIS — N186 End stage renal disease: Secondary | ICD-10-CM | POA: Diagnosis not present

## 2023-04-15 DIAGNOSIS — Z4932 Encounter for adequacy testing for peritoneal dialysis: Secondary | ICD-10-CM | POA: Diagnosis not present

## 2023-04-16 DIAGNOSIS — N186 End stage renal disease: Secondary | ICD-10-CM | POA: Diagnosis not present

## 2023-04-16 DIAGNOSIS — Z992 Dependence on renal dialysis: Secondary | ICD-10-CM | POA: Diagnosis not present

## 2023-04-16 DIAGNOSIS — D631 Anemia in chronic kidney disease: Secondary | ICD-10-CM | POA: Diagnosis not present

## 2023-04-16 DIAGNOSIS — D509 Iron deficiency anemia, unspecified: Secondary | ICD-10-CM | POA: Diagnosis not present

## 2023-04-16 DIAGNOSIS — Z4932 Encounter for adequacy testing for peritoneal dialysis: Secondary | ICD-10-CM | POA: Diagnosis not present

## 2023-04-16 DIAGNOSIS — K769 Liver disease, unspecified: Secondary | ICD-10-CM | POA: Diagnosis not present

## 2023-04-16 DIAGNOSIS — N2581 Secondary hyperparathyroidism of renal origin: Secondary | ICD-10-CM | POA: Diagnosis not present

## 2023-04-17 DIAGNOSIS — N2581 Secondary hyperparathyroidism of renal origin: Secondary | ICD-10-CM | POA: Diagnosis not present

## 2023-04-17 DIAGNOSIS — K769 Liver disease, unspecified: Secondary | ICD-10-CM | POA: Diagnosis not present

## 2023-04-17 DIAGNOSIS — D631 Anemia in chronic kidney disease: Secondary | ICD-10-CM | POA: Diagnosis not present

## 2023-04-17 DIAGNOSIS — Z992 Dependence on renal dialysis: Secondary | ICD-10-CM | POA: Diagnosis not present

## 2023-04-17 DIAGNOSIS — N186 End stage renal disease: Secondary | ICD-10-CM | POA: Diagnosis not present

## 2023-04-17 DIAGNOSIS — Z4932 Encounter for adequacy testing for peritoneal dialysis: Secondary | ICD-10-CM | POA: Diagnosis not present

## 2023-04-17 DIAGNOSIS — D509 Iron deficiency anemia, unspecified: Secondary | ICD-10-CM | POA: Diagnosis not present

## 2023-04-18 DIAGNOSIS — D631 Anemia in chronic kidney disease: Secondary | ICD-10-CM | POA: Diagnosis not present

## 2023-04-18 DIAGNOSIS — Z992 Dependence on renal dialysis: Secondary | ICD-10-CM | POA: Diagnosis not present

## 2023-04-18 DIAGNOSIS — N2581 Secondary hyperparathyroidism of renal origin: Secondary | ICD-10-CM | POA: Diagnosis not present

## 2023-04-18 DIAGNOSIS — D509 Iron deficiency anemia, unspecified: Secondary | ICD-10-CM | POA: Diagnosis not present

## 2023-04-18 DIAGNOSIS — N186 End stage renal disease: Secondary | ICD-10-CM | POA: Diagnosis not present

## 2023-04-18 DIAGNOSIS — K769 Liver disease, unspecified: Secondary | ICD-10-CM | POA: Diagnosis not present

## 2023-04-18 DIAGNOSIS — Z4932 Encounter for adequacy testing for peritoneal dialysis: Secondary | ICD-10-CM | POA: Diagnosis not present

## 2023-04-19 DIAGNOSIS — D509 Iron deficiency anemia, unspecified: Secondary | ICD-10-CM | POA: Diagnosis not present

## 2023-04-19 DIAGNOSIS — Z992 Dependence on renal dialysis: Secondary | ICD-10-CM | POA: Diagnosis not present

## 2023-04-19 DIAGNOSIS — Z4932 Encounter for adequacy testing for peritoneal dialysis: Secondary | ICD-10-CM | POA: Diagnosis not present

## 2023-04-19 DIAGNOSIS — N2581 Secondary hyperparathyroidism of renal origin: Secondary | ICD-10-CM | POA: Diagnosis not present

## 2023-04-19 DIAGNOSIS — K769 Liver disease, unspecified: Secondary | ICD-10-CM | POA: Diagnosis not present

## 2023-04-19 DIAGNOSIS — N186 End stage renal disease: Secondary | ICD-10-CM | POA: Diagnosis not present

## 2023-04-19 DIAGNOSIS — D631 Anemia in chronic kidney disease: Secondary | ICD-10-CM | POA: Diagnosis not present

## 2023-04-20 DIAGNOSIS — D509 Iron deficiency anemia, unspecified: Secondary | ICD-10-CM | POA: Diagnosis not present

## 2023-04-20 DIAGNOSIS — D631 Anemia in chronic kidney disease: Secondary | ICD-10-CM | POA: Diagnosis not present

## 2023-04-20 DIAGNOSIS — K769 Liver disease, unspecified: Secondary | ICD-10-CM | POA: Diagnosis not present

## 2023-04-20 DIAGNOSIS — Z992 Dependence on renal dialysis: Secondary | ICD-10-CM | POA: Diagnosis not present

## 2023-04-20 DIAGNOSIS — Z4932 Encounter for adequacy testing for peritoneal dialysis: Secondary | ICD-10-CM | POA: Diagnosis not present

## 2023-04-20 DIAGNOSIS — N186 End stage renal disease: Secondary | ICD-10-CM | POA: Diagnosis not present

## 2023-04-20 DIAGNOSIS — N2581 Secondary hyperparathyroidism of renal origin: Secondary | ICD-10-CM | POA: Diagnosis not present

## 2023-04-21 DIAGNOSIS — Z992 Dependence on renal dialysis: Secondary | ICD-10-CM | POA: Diagnosis not present

## 2023-04-21 DIAGNOSIS — Z4932 Encounter for adequacy testing for peritoneal dialysis: Secondary | ICD-10-CM | POA: Diagnosis not present

## 2023-04-21 DIAGNOSIS — N2581 Secondary hyperparathyroidism of renal origin: Secondary | ICD-10-CM | POA: Diagnosis not present

## 2023-04-21 DIAGNOSIS — D509 Iron deficiency anemia, unspecified: Secondary | ICD-10-CM | POA: Diagnosis not present

## 2023-04-21 DIAGNOSIS — K769 Liver disease, unspecified: Secondary | ICD-10-CM | POA: Diagnosis not present

## 2023-04-21 DIAGNOSIS — D631 Anemia in chronic kidney disease: Secondary | ICD-10-CM | POA: Diagnosis not present

## 2023-04-21 DIAGNOSIS — N186 End stage renal disease: Secondary | ICD-10-CM | POA: Diagnosis not present

## 2023-04-22 DIAGNOSIS — N186 End stage renal disease: Secondary | ICD-10-CM | POA: Diagnosis not present

## 2023-04-22 DIAGNOSIS — Z992 Dependence on renal dialysis: Secondary | ICD-10-CM | POA: Diagnosis not present

## 2023-04-22 DIAGNOSIS — Z4932 Encounter for adequacy testing for peritoneal dialysis: Secondary | ICD-10-CM | POA: Diagnosis not present

## 2023-04-22 DIAGNOSIS — N2581 Secondary hyperparathyroidism of renal origin: Secondary | ICD-10-CM | POA: Diagnosis not present

## 2023-04-22 DIAGNOSIS — D509 Iron deficiency anemia, unspecified: Secondary | ICD-10-CM | POA: Diagnosis not present

## 2023-04-22 DIAGNOSIS — K769 Liver disease, unspecified: Secondary | ICD-10-CM | POA: Diagnosis not present

## 2023-04-22 DIAGNOSIS — D631 Anemia in chronic kidney disease: Secondary | ICD-10-CM | POA: Diagnosis not present

## 2023-04-23 DIAGNOSIS — N186 End stage renal disease: Secondary | ICD-10-CM | POA: Diagnosis not present

## 2023-04-23 DIAGNOSIS — K769 Liver disease, unspecified: Secondary | ICD-10-CM | POA: Diagnosis not present

## 2023-04-23 DIAGNOSIS — N2581 Secondary hyperparathyroidism of renal origin: Secondary | ICD-10-CM | POA: Diagnosis not present

## 2023-04-23 DIAGNOSIS — D509 Iron deficiency anemia, unspecified: Secondary | ICD-10-CM | POA: Diagnosis not present

## 2023-04-23 DIAGNOSIS — Z4932 Encounter for adequacy testing for peritoneal dialysis: Secondary | ICD-10-CM | POA: Diagnosis not present

## 2023-04-23 DIAGNOSIS — Z992 Dependence on renal dialysis: Secondary | ICD-10-CM | POA: Diagnosis not present

## 2023-04-23 DIAGNOSIS — D631 Anemia in chronic kidney disease: Secondary | ICD-10-CM | POA: Diagnosis not present

## 2023-04-24 DIAGNOSIS — Z992 Dependence on renal dialysis: Secondary | ICD-10-CM | POA: Diagnosis not present

## 2023-04-24 DIAGNOSIS — N2581 Secondary hyperparathyroidism of renal origin: Secondary | ICD-10-CM | POA: Diagnosis not present

## 2023-04-24 DIAGNOSIS — N186 End stage renal disease: Secondary | ICD-10-CM | POA: Diagnosis not present

## 2023-04-24 DIAGNOSIS — D631 Anemia in chronic kidney disease: Secondary | ICD-10-CM | POA: Diagnosis not present

## 2023-04-24 DIAGNOSIS — Z4932 Encounter for adequacy testing for peritoneal dialysis: Secondary | ICD-10-CM | POA: Diagnosis not present

## 2023-04-24 DIAGNOSIS — K769 Liver disease, unspecified: Secondary | ICD-10-CM | POA: Diagnosis not present

## 2023-04-24 DIAGNOSIS — D509 Iron deficiency anemia, unspecified: Secondary | ICD-10-CM | POA: Diagnosis not present

## 2023-04-25 DIAGNOSIS — D509 Iron deficiency anemia, unspecified: Secondary | ICD-10-CM | POA: Diagnosis not present

## 2023-04-25 DIAGNOSIS — K769 Liver disease, unspecified: Secondary | ICD-10-CM | POA: Diagnosis not present

## 2023-04-25 DIAGNOSIS — Z4932 Encounter for adequacy testing for peritoneal dialysis: Secondary | ICD-10-CM | POA: Diagnosis not present

## 2023-04-25 DIAGNOSIS — Z992 Dependence on renal dialysis: Secondary | ICD-10-CM | POA: Diagnosis not present

## 2023-04-25 DIAGNOSIS — N2581 Secondary hyperparathyroidism of renal origin: Secondary | ICD-10-CM | POA: Diagnosis not present

## 2023-04-25 DIAGNOSIS — N186 End stage renal disease: Secondary | ICD-10-CM | POA: Diagnosis not present

## 2023-04-25 DIAGNOSIS — D631 Anemia in chronic kidney disease: Secondary | ICD-10-CM | POA: Diagnosis not present

## 2023-04-26 DIAGNOSIS — N2581 Secondary hyperparathyroidism of renal origin: Secondary | ICD-10-CM | POA: Diagnosis not present

## 2023-04-26 DIAGNOSIS — D631 Anemia in chronic kidney disease: Secondary | ICD-10-CM | POA: Diagnosis not present

## 2023-04-26 DIAGNOSIS — Z992 Dependence on renal dialysis: Secondary | ICD-10-CM | POA: Diagnosis not present

## 2023-04-26 DIAGNOSIS — Z4932 Encounter for adequacy testing for peritoneal dialysis: Secondary | ICD-10-CM | POA: Diagnosis not present

## 2023-04-26 DIAGNOSIS — N186 End stage renal disease: Secondary | ICD-10-CM | POA: Diagnosis not present

## 2023-04-26 DIAGNOSIS — D509 Iron deficiency anemia, unspecified: Secondary | ICD-10-CM | POA: Diagnosis not present

## 2023-04-26 DIAGNOSIS — K769 Liver disease, unspecified: Secondary | ICD-10-CM | POA: Diagnosis not present

## 2023-04-27 DIAGNOSIS — Z992 Dependence on renal dialysis: Secondary | ICD-10-CM | POA: Diagnosis not present

## 2023-04-27 DIAGNOSIS — Z4932 Encounter for adequacy testing for peritoneal dialysis: Secondary | ICD-10-CM | POA: Diagnosis not present

## 2023-04-27 DIAGNOSIS — K769 Liver disease, unspecified: Secondary | ICD-10-CM | POA: Diagnosis not present

## 2023-04-27 DIAGNOSIS — D509 Iron deficiency anemia, unspecified: Secondary | ICD-10-CM | POA: Diagnosis not present

## 2023-04-27 DIAGNOSIS — N186 End stage renal disease: Secondary | ICD-10-CM | POA: Diagnosis not present

## 2023-04-27 DIAGNOSIS — N2581 Secondary hyperparathyroidism of renal origin: Secondary | ICD-10-CM | POA: Diagnosis not present

## 2023-04-27 DIAGNOSIS — D631 Anemia in chronic kidney disease: Secondary | ICD-10-CM | POA: Diagnosis not present

## 2023-04-28 DIAGNOSIS — Z992 Dependence on renal dialysis: Secondary | ICD-10-CM | POA: Diagnosis not present

## 2023-04-28 DIAGNOSIS — D631 Anemia in chronic kidney disease: Secondary | ICD-10-CM | POA: Diagnosis not present

## 2023-04-28 DIAGNOSIS — Z4932 Encounter for adequacy testing for peritoneal dialysis: Secondary | ICD-10-CM | POA: Diagnosis not present

## 2023-04-28 DIAGNOSIS — K769 Liver disease, unspecified: Secondary | ICD-10-CM | POA: Diagnosis not present

## 2023-04-28 DIAGNOSIS — N2581 Secondary hyperparathyroidism of renal origin: Secondary | ICD-10-CM | POA: Diagnosis not present

## 2023-04-28 DIAGNOSIS — D509 Iron deficiency anemia, unspecified: Secondary | ICD-10-CM | POA: Diagnosis not present

## 2023-04-28 DIAGNOSIS — N186 End stage renal disease: Secondary | ICD-10-CM | POA: Diagnosis not present

## 2023-04-29 DIAGNOSIS — K769 Liver disease, unspecified: Secondary | ICD-10-CM | POA: Diagnosis not present

## 2023-04-29 DIAGNOSIS — Z4932 Encounter for adequacy testing for peritoneal dialysis: Secondary | ICD-10-CM | POA: Diagnosis not present

## 2023-04-29 DIAGNOSIS — D509 Iron deficiency anemia, unspecified: Secondary | ICD-10-CM | POA: Diagnosis not present

## 2023-04-29 DIAGNOSIS — D631 Anemia in chronic kidney disease: Secondary | ICD-10-CM | POA: Diagnosis not present

## 2023-04-29 DIAGNOSIS — N2581 Secondary hyperparathyroidism of renal origin: Secondary | ICD-10-CM | POA: Diagnosis not present

## 2023-04-29 DIAGNOSIS — N186 End stage renal disease: Secondary | ICD-10-CM | POA: Diagnosis not present

## 2023-04-29 DIAGNOSIS — Z992 Dependence on renal dialysis: Secondary | ICD-10-CM | POA: Diagnosis not present

## 2023-04-30 DIAGNOSIS — Z992 Dependence on renal dialysis: Secondary | ICD-10-CM | POA: Diagnosis not present

## 2023-04-30 DIAGNOSIS — K769 Liver disease, unspecified: Secondary | ICD-10-CM | POA: Diagnosis not present

## 2023-04-30 DIAGNOSIS — N186 End stage renal disease: Secondary | ICD-10-CM | POA: Diagnosis not present

## 2023-04-30 DIAGNOSIS — D631 Anemia in chronic kidney disease: Secondary | ICD-10-CM | POA: Diagnosis not present

## 2023-04-30 DIAGNOSIS — D509 Iron deficiency anemia, unspecified: Secondary | ICD-10-CM | POA: Diagnosis not present

## 2023-04-30 DIAGNOSIS — Z4932 Encounter for adequacy testing for peritoneal dialysis: Secondary | ICD-10-CM | POA: Diagnosis not present

## 2023-04-30 DIAGNOSIS — N2581 Secondary hyperparathyroidism of renal origin: Secondary | ICD-10-CM | POA: Diagnosis not present

## 2023-05-01 DIAGNOSIS — N186 End stage renal disease: Secondary | ICD-10-CM | POA: Diagnosis not present

## 2023-05-01 DIAGNOSIS — Z4932 Encounter for adequacy testing for peritoneal dialysis: Secondary | ICD-10-CM | POA: Diagnosis not present

## 2023-05-01 DIAGNOSIS — D631 Anemia in chronic kidney disease: Secondary | ICD-10-CM | POA: Diagnosis not present

## 2023-05-01 DIAGNOSIS — K769 Liver disease, unspecified: Secondary | ICD-10-CM | POA: Diagnosis not present

## 2023-05-01 DIAGNOSIS — Z992 Dependence on renal dialysis: Secondary | ICD-10-CM | POA: Diagnosis not present

## 2023-05-01 DIAGNOSIS — D509 Iron deficiency anemia, unspecified: Secondary | ICD-10-CM | POA: Diagnosis not present

## 2023-05-01 DIAGNOSIS — N2581 Secondary hyperparathyroidism of renal origin: Secondary | ICD-10-CM | POA: Diagnosis not present

## 2023-05-02 DIAGNOSIS — D631 Anemia in chronic kidney disease: Secondary | ICD-10-CM | POA: Diagnosis not present

## 2023-05-02 DIAGNOSIS — N2581 Secondary hyperparathyroidism of renal origin: Secondary | ICD-10-CM | POA: Diagnosis not present

## 2023-05-02 DIAGNOSIS — Z992 Dependence on renal dialysis: Secondary | ICD-10-CM | POA: Diagnosis not present

## 2023-05-02 DIAGNOSIS — D509 Iron deficiency anemia, unspecified: Secondary | ICD-10-CM | POA: Diagnosis not present

## 2023-05-02 DIAGNOSIS — K769 Liver disease, unspecified: Secondary | ICD-10-CM | POA: Diagnosis not present

## 2023-05-02 DIAGNOSIS — Z4932 Encounter for adequacy testing for peritoneal dialysis: Secondary | ICD-10-CM | POA: Diagnosis not present

## 2023-05-02 DIAGNOSIS — N186 End stage renal disease: Secondary | ICD-10-CM | POA: Diagnosis not present

## 2023-05-03 DIAGNOSIS — N2581 Secondary hyperparathyroidism of renal origin: Secondary | ICD-10-CM | POA: Diagnosis not present

## 2023-05-03 DIAGNOSIS — Z992 Dependence on renal dialysis: Secondary | ICD-10-CM | POA: Diagnosis not present

## 2023-05-03 DIAGNOSIS — D509 Iron deficiency anemia, unspecified: Secondary | ICD-10-CM | POA: Diagnosis not present

## 2023-05-03 DIAGNOSIS — D631 Anemia in chronic kidney disease: Secondary | ICD-10-CM | POA: Diagnosis not present

## 2023-05-03 DIAGNOSIS — N186 End stage renal disease: Secondary | ICD-10-CM | POA: Diagnosis not present

## 2023-05-03 DIAGNOSIS — Z4932 Encounter for adequacy testing for peritoneal dialysis: Secondary | ICD-10-CM | POA: Diagnosis not present

## 2023-05-03 DIAGNOSIS — K769 Liver disease, unspecified: Secondary | ICD-10-CM | POA: Diagnosis not present

## 2023-05-04 DIAGNOSIS — N2581 Secondary hyperparathyroidism of renal origin: Secondary | ICD-10-CM | POA: Diagnosis not present

## 2023-05-04 DIAGNOSIS — D509 Iron deficiency anemia, unspecified: Secondary | ICD-10-CM | POA: Diagnosis not present

## 2023-05-04 DIAGNOSIS — N186 End stage renal disease: Secondary | ICD-10-CM | POA: Diagnosis not present

## 2023-05-04 DIAGNOSIS — Z992 Dependence on renal dialysis: Secondary | ICD-10-CM | POA: Diagnosis not present

## 2023-05-04 DIAGNOSIS — D631 Anemia in chronic kidney disease: Secondary | ICD-10-CM | POA: Diagnosis not present

## 2023-05-04 DIAGNOSIS — Z4932 Encounter for adequacy testing for peritoneal dialysis: Secondary | ICD-10-CM | POA: Diagnosis not present

## 2023-05-04 DIAGNOSIS — K769 Liver disease, unspecified: Secondary | ICD-10-CM | POA: Diagnosis not present

## 2023-05-05 DIAGNOSIS — D509 Iron deficiency anemia, unspecified: Secondary | ICD-10-CM | POA: Diagnosis not present

## 2023-05-05 DIAGNOSIS — N186 End stage renal disease: Secondary | ICD-10-CM | POA: Diagnosis not present

## 2023-05-05 DIAGNOSIS — N2581 Secondary hyperparathyroidism of renal origin: Secondary | ICD-10-CM | POA: Diagnosis not present

## 2023-05-05 DIAGNOSIS — Z992 Dependence on renal dialysis: Secondary | ICD-10-CM | POA: Diagnosis not present

## 2023-05-05 DIAGNOSIS — D631 Anemia in chronic kidney disease: Secondary | ICD-10-CM | POA: Diagnosis not present

## 2023-05-05 DIAGNOSIS — K769 Liver disease, unspecified: Secondary | ICD-10-CM | POA: Diagnosis not present

## 2023-05-05 DIAGNOSIS — Z4932 Encounter for adequacy testing for peritoneal dialysis: Secondary | ICD-10-CM | POA: Diagnosis not present

## 2023-05-06 DIAGNOSIS — D631 Anemia in chronic kidney disease: Secondary | ICD-10-CM | POA: Diagnosis not present

## 2023-05-06 DIAGNOSIS — D509 Iron deficiency anemia, unspecified: Secondary | ICD-10-CM | POA: Diagnosis not present

## 2023-05-06 DIAGNOSIS — N186 End stage renal disease: Secondary | ICD-10-CM | POA: Diagnosis not present

## 2023-05-06 DIAGNOSIS — Z4932 Encounter for adequacy testing for peritoneal dialysis: Secondary | ICD-10-CM | POA: Diagnosis not present

## 2023-05-06 DIAGNOSIS — N2581 Secondary hyperparathyroidism of renal origin: Secondary | ICD-10-CM | POA: Diagnosis not present

## 2023-05-06 DIAGNOSIS — K769 Liver disease, unspecified: Secondary | ICD-10-CM | POA: Diagnosis not present

## 2023-05-06 DIAGNOSIS — Z992 Dependence on renal dialysis: Secondary | ICD-10-CM | POA: Diagnosis not present

## 2023-05-07 DIAGNOSIS — Z992 Dependence on renal dialysis: Secondary | ICD-10-CM | POA: Diagnosis not present

## 2023-05-07 DIAGNOSIS — N2581 Secondary hyperparathyroidism of renal origin: Secondary | ICD-10-CM | POA: Diagnosis not present

## 2023-05-07 DIAGNOSIS — D631 Anemia in chronic kidney disease: Secondary | ICD-10-CM | POA: Diagnosis not present

## 2023-05-07 DIAGNOSIS — N186 End stage renal disease: Secondary | ICD-10-CM | POA: Diagnosis not present

## 2023-05-07 DIAGNOSIS — K769 Liver disease, unspecified: Secondary | ICD-10-CM | POA: Diagnosis not present

## 2023-05-07 DIAGNOSIS — D509 Iron deficiency anemia, unspecified: Secondary | ICD-10-CM | POA: Diagnosis not present

## 2023-05-07 DIAGNOSIS — Z4932 Encounter for adequacy testing for peritoneal dialysis: Secondary | ICD-10-CM | POA: Diagnosis not present

## 2023-05-08 DIAGNOSIS — D509 Iron deficiency anemia, unspecified: Secondary | ICD-10-CM | POA: Diagnosis not present

## 2023-05-08 DIAGNOSIS — Z4932 Encounter for adequacy testing for peritoneal dialysis: Secondary | ICD-10-CM | POA: Diagnosis not present

## 2023-05-08 DIAGNOSIS — D631 Anemia in chronic kidney disease: Secondary | ICD-10-CM | POA: Diagnosis not present

## 2023-05-08 DIAGNOSIS — N2581 Secondary hyperparathyroidism of renal origin: Secondary | ICD-10-CM | POA: Diagnosis not present

## 2023-05-08 DIAGNOSIS — N186 End stage renal disease: Secondary | ICD-10-CM | POA: Diagnosis not present

## 2023-05-08 DIAGNOSIS — Z992 Dependence on renal dialysis: Secondary | ICD-10-CM | POA: Diagnosis not present

## 2023-05-08 DIAGNOSIS — K769 Liver disease, unspecified: Secondary | ICD-10-CM | POA: Diagnosis not present

## 2023-05-09 DIAGNOSIS — Z4932 Encounter for adequacy testing for peritoneal dialysis: Secondary | ICD-10-CM | POA: Diagnosis not present

## 2023-05-09 DIAGNOSIS — K769 Liver disease, unspecified: Secondary | ICD-10-CM | POA: Diagnosis not present

## 2023-05-09 DIAGNOSIS — D509 Iron deficiency anemia, unspecified: Secondary | ICD-10-CM | POA: Diagnosis not present

## 2023-05-09 DIAGNOSIS — D631 Anemia in chronic kidney disease: Secondary | ICD-10-CM | POA: Diagnosis not present

## 2023-05-09 DIAGNOSIS — Z992 Dependence on renal dialysis: Secondary | ICD-10-CM | POA: Diagnosis not present

## 2023-05-09 DIAGNOSIS — N186 End stage renal disease: Secondary | ICD-10-CM | POA: Diagnosis not present

## 2023-05-09 DIAGNOSIS — N2581 Secondary hyperparathyroidism of renal origin: Secondary | ICD-10-CM | POA: Diagnosis not present

## 2023-05-10 DIAGNOSIS — N186 End stage renal disease: Secondary | ICD-10-CM | POA: Diagnosis not present

## 2023-05-10 DIAGNOSIS — K769 Liver disease, unspecified: Secondary | ICD-10-CM | POA: Diagnosis not present

## 2023-05-10 DIAGNOSIS — Z4932 Encounter for adequacy testing for peritoneal dialysis: Secondary | ICD-10-CM | POA: Diagnosis not present

## 2023-05-10 DIAGNOSIS — D509 Iron deficiency anemia, unspecified: Secondary | ICD-10-CM | POA: Diagnosis not present

## 2023-05-10 DIAGNOSIS — D631 Anemia in chronic kidney disease: Secondary | ICD-10-CM | POA: Diagnosis not present

## 2023-05-10 DIAGNOSIS — Z992 Dependence on renal dialysis: Secondary | ICD-10-CM | POA: Diagnosis not present

## 2023-05-10 DIAGNOSIS — N2581 Secondary hyperparathyroidism of renal origin: Secondary | ICD-10-CM | POA: Diagnosis not present

## 2023-05-11 DIAGNOSIS — N186 End stage renal disease: Secondary | ICD-10-CM | POA: Diagnosis not present

## 2023-05-11 DIAGNOSIS — Z992 Dependence on renal dialysis: Secondary | ICD-10-CM | POA: Diagnosis not present

## 2023-05-11 DIAGNOSIS — D509 Iron deficiency anemia, unspecified: Secondary | ICD-10-CM | POA: Diagnosis not present

## 2023-05-11 DIAGNOSIS — N2581 Secondary hyperparathyroidism of renal origin: Secondary | ICD-10-CM | POA: Diagnosis not present

## 2023-05-11 DIAGNOSIS — Z4932 Encounter for adequacy testing for peritoneal dialysis: Secondary | ICD-10-CM | POA: Diagnosis not present

## 2023-05-11 DIAGNOSIS — K769 Liver disease, unspecified: Secondary | ICD-10-CM | POA: Diagnosis not present

## 2023-05-11 DIAGNOSIS — D631 Anemia in chronic kidney disease: Secondary | ICD-10-CM | POA: Diagnosis not present

## 2023-05-12 DIAGNOSIS — Z992 Dependence on renal dialysis: Secondary | ICD-10-CM | POA: Diagnosis not present

## 2023-05-12 DIAGNOSIS — Z4932 Encounter for adequacy testing for peritoneal dialysis: Secondary | ICD-10-CM | POA: Diagnosis not present

## 2023-05-12 DIAGNOSIS — K769 Liver disease, unspecified: Secondary | ICD-10-CM | POA: Diagnosis not present

## 2023-05-12 DIAGNOSIS — D631 Anemia in chronic kidney disease: Secondary | ICD-10-CM | POA: Diagnosis not present

## 2023-05-12 DIAGNOSIS — N186 End stage renal disease: Secondary | ICD-10-CM | POA: Diagnosis not present

## 2023-05-12 DIAGNOSIS — I129 Hypertensive chronic kidney disease with stage 1 through stage 4 chronic kidney disease, or unspecified chronic kidney disease: Secondary | ICD-10-CM | POA: Diagnosis not present

## 2023-05-12 DIAGNOSIS — N2581 Secondary hyperparathyroidism of renal origin: Secondary | ICD-10-CM | POA: Diagnosis not present

## 2023-05-12 DIAGNOSIS — D509 Iron deficiency anemia, unspecified: Secondary | ICD-10-CM | POA: Diagnosis not present

## 2023-05-13 DIAGNOSIS — K769 Liver disease, unspecified: Secondary | ICD-10-CM | POA: Diagnosis not present

## 2023-05-13 DIAGNOSIS — Z4932 Encounter for adequacy testing for peritoneal dialysis: Secondary | ICD-10-CM | POA: Diagnosis not present

## 2023-05-13 DIAGNOSIS — D631 Anemia in chronic kidney disease: Secondary | ICD-10-CM | POA: Diagnosis not present

## 2023-05-13 DIAGNOSIS — N2581 Secondary hyperparathyroidism of renal origin: Secondary | ICD-10-CM | POA: Diagnosis not present

## 2023-05-13 DIAGNOSIS — D509 Iron deficiency anemia, unspecified: Secondary | ICD-10-CM | POA: Diagnosis not present

## 2023-05-13 DIAGNOSIS — N186 End stage renal disease: Secondary | ICD-10-CM | POA: Diagnosis not present

## 2023-05-13 DIAGNOSIS — Z992 Dependence on renal dialysis: Secondary | ICD-10-CM | POA: Diagnosis not present

## 2023-05-14 DIAGNOSIS — D509 Iron deficiency anemia, unspecified: Secondary | ICD-10-CM | POA: Diagnosis not present

## 2023-05-14 DIAGNOSIS — D631 Anemia in chronic kidney disease: Secondary | ICD-10-CM | POA: Diagnosis not present

## 2023-05-14 DIAGNOSIS — N2581 Secondary hyperparathyroidism of renal origin: Secondary | ICD-10-CM | POA: Diagnosis not present

## 2023-05-14 DIAGNOSIS — Z4932 Encounter for adequacy testing for peritoneal dialysis: Secondary | ICD-10-CM | POA: Diagnosis not present

## 2023-05-14 DIAGNOSIS — Z992 Dependence on renal dialysis: Secondary | ICD-10-CM | POA: Diagnosis not present

## 2023-05-14 DIAGNOSIS — N186 End stage renal disease: Secondary | ICD-10-CM | POA: Diagnosis not present

## 2023-05-14 DIAGNOSIS — K769 Liver disease, unspecified: Secondary | ICD-10-CM | POA: Diagnosis not present

## 2023-05-15 DIAGNOSIS — Z992 Dependence on renal dialysis: Secondary | ICD-10-CM | POA: Diagnosis not present

## 2023-05-15 DIAGNOSIS — K769 Liver disease, unspecified: Secondary | ICD-10-CM | POA: Diagnosis not present

## 2023-05-15 DIAGNOSIS — D509 Iron deficiency anemia, unspecified: Secondary | ICD-10-CM | POA: Diagnosis not present

## 2023-05-15 DIAGNOSIS — Z4932 Encounter for adequacy testing for peritoneal dialysis: Secondary | ICD-10-CM | POA: Diagnosis not present

## 2023-05-15 DIAGNOSIS — N186 End stage renal disease: Secondary | ICD-10-CM | POA: Diagnosis not present

## 2023-05-15 DIAGNOSIS — D631 Anemia in chronic kidney disease: Secondary | ICD-10-CM | POA: Diagnosis not present

## 2023-05-15 DIAGNOSIS — N2581 Secondary hyperparathyroidism of renal origin: Secondary | ICD-10-CM | POA: Diagnosis not present

## 2023-05-16 DIAGNOSIS — K769 Liver disease, unspecified: Secondary | ICD-10-CM | POA: Diagnosis not present

## 2023-05-16 DIAGNOSIS — D631 Anemia in chronic kidney disease: Secondary | ICD-10-CM | POA: Diagnosis not present

## 2023-05-16 DIAGNOSIS — N186 End stage renal disease: Secondary | ICD-10-CM | POA: Diagnosis not present

## 2023-05-16 DIAGNOSIS — N2581 Secondary hyperparathyroidism of renal origin: Secondary | ICD-10-CM | POA: Diagnosis not present

## 2023-05-16 DIAGNOSIS — Z4932 Encounter for adequacy testing for peritoneal dialysis: Secondary | ICD-10-CM | POA: Diagnosis not present

## 2023-05-16 DIAGNOSIS — Z992 Dependence on renal dialysis: Secondary | ICD-10-CM | POA: Diagnosis not present

## 2023-05-16 DIAGNOSIS — D509 Iron deficiency anemia, unspecified: Secondary | ICD-10-CM | POA: Diagnosis not present

## 2023-05-17 DIAGNOSIS — Z992 Dependence on renal dialysis: Secondary | ICD-10-CM | POA: Diagnosis not present

## 2023-05-17 DIAGNOSIS — D631 Anemia in chronic kidney disease: Secondary | ICD-10-CM | POA: Diagnosis not present

## 2023-05-17 DIAGNOSIS — K769 Liver disease, unspecified: Secondary | ICD-10-CM | POA: Diagnosis not present

## 2023-05-17 DIAGNOSIS — Z4932 Encounter for adequacy testing for peritoneal dialysis: Secondary | ICD-10-CM | POA: Diagnosis not present

## 2023-05-17 DIAGNOSIS — D509 Iron deficiency anemia, unspecified: Secondary | ICD-10-CM | POA: Diagnosis not present

## 2023-05-17 DIAGNOSIS — N186 End stage renal disease: Secondary | ICD-10-CM | POA: Diagnosis not present

## 2023-05-17 DIAGNOSIS — N2581 Secondary hyperparathyroidism of renal origin: Secondary | ICD-10-CM | POA: Diagnosis not present

## 2023-05-18 DIAGNOSIS — Z4932 Encounter for adequacy testing for peritoneal dialysis: Secondary | ICD-10-CM | POA: Diagnosis not present

## 2023-05-18 DIAGNOSIS — D631 Anemia in chronic kidney disease: Secondary | ICD-10-CM | POA: Diagnosis not present

## 2023-05-18 DIAGNOSIS — Z992 Dependence on renal dialysis: Secondary | ICD-10-CM | POA: Diagnosis not present

## 2023-05-18 DIAGNOSIS — K769 Liver disease, unspecified: Secondary | ICD-10-CM | POA: Diagnosis not present

## 2023-05-18 DIAGNOSIS — N186 End stage renal disease: Secondary | ICD-10-CM | POA: Diagnosis not present

## 2023-05-18 DIAGNOSIS — N2581 Secondary hyperparathyroidism of renal origin: Secondary | ICD-10-CM | POA: Diagnosis not present

## 2023-05-18 DIAGNOSIS — D509 Iron deficiency anemia, unspecified: Secondary | ICD-10-CM | POA: Diagnosis not present

## 2023-05-19 DIAGNOSIS — D631 Anemia in chronic kidney disease: Secondary | ICD-10-CM | POA: Diagnosis not present

## 2023-05-19 DIAGNOSIS — Z4932 Encounter for adequacy testing for peritoneal dialysis: Secondary | ICD-10-CM | POA: Diagnosis not present

## 2023-05-19 DIAGNOSIS — D509 Iron deficiency anemia, unspecified: Secondary | ICD-10-CM | POA: Diagnosis not present

## 2023-05-19 DIAGNOSIS — K769 Liver disease, unspecified: Secondary | ICD-10-CM | POA: Diagnosis not present

## 2023-05-19 DIAGNOSIS — Z992 Dependence on renal dialysis: Secondary | ICD-10-CM | POA: Diagnosis not present

## 2023-05-19 DIAGNOSIS — N186 End stage renal disease: Secondary | ICD-10-CM | POA: Diagnosis not present

## 2023-05-19 DIAGNOSIS — N2581 Secondary hyperparathyroidism of renal origin: Secondary | ICD-10-CM | POA: Diagnosis not present

## 2023-05-20 DIAGNOSIS — Z992 Dependence on renal dialysis: Secondary | ICD-10-CM | POA: Diagnosis not present

## 2023-05-20 DIAGNOSIS — N186 End stage renal disease: Secondary | ICD-10-CM | POA: Diagnosis not present

## 2023-05-20 DIAGNOSIS — K769 Liver disease, unspecified: Secondary | ICD-10-CM | POA: Diagnosis not present

## 2023-05-20 DIAGNOSIS — Z4932 Encounter for adequacy testing for peritoneal dialysis: Secondary | ICD-10-CM | POA: Diagnosis not present

## 2023-05-20 DIAGNOSIS — N2581 Secondary hyperparathyroidism of renal origin: Secondary | ICD-10-CM | POA: Diagnosis not present

## 2023-05-20 DIAGNOSIS — D631 Anemia in chronic kidney disease: Secondary | ICD-10-CM | POA: Diagnosis not present

## 2023-05-20 DIAGNOSIS — D509 Iron deficiency anemia, unspecified: Secondary | ICD-10-CM | POA: Diagnosis not present

## 2023-05-21 DIAGNOSIS — N186 End stage renal disease: Secondary | ICD-10-CM | POA: Diagnosis not present

## 2023-05-21 DIAGNOSIS — N2581 Secondary hyperparathyroidism of renal origin: Secondary | ICD-10-CM | POA: Diagnosis not present

## 2023-05-21 DIAGNOSIS — D631 Anemia in chronic kidney disease: Secondary | ICD-10-CM | POA: Diagnosis not present

## 2023-05-21 DIAGNOSIS — Z992 Dependence on renal dialysis: Secondary | ICD-10-CM | POA: Diagnosis not present

## 2023-05-21 DIAGNOSIS — K769 Liver disease, unspecified: Secondary | ICD-10-CM | POA: Diagnosis not present

## 2023-05-21 DIAGNOSIS — Z4932 Encounter for adequacy testing for peritoneal dialysis: Secondary | ICD-10-CM | POA: Diagnosis not present

## 2023-05-21 DIAGNOSIS — D509 Iron deficiency anemia, unspecified: Secondary | ICD-10-CM | POA: Diagnosis not present

## 2023-05-22 DIAGNOSIS — D631 Anemia in chronic kidney disease: Secondary | ICD-10-CM | POA: Diagnosis not present

## 2023-05-22 DIAGNOSIS — N186 End stage renal disease: Secondary | ICD-10-CM | POA: Diagnosis not present

## 2023-05-22 DIAGNOSIS — Z4932 Encounter for adequacy testing for peritoneal dialysis: Secondary | ICD-10-CM | POA: Diagnosis not present

## 2023-05-22 DIAGNOSIS — N2581 Secondary hyperparathyroidism of renal origin: Secondary | ICD-10-CM | POA: Diagnosis not present

## 2023-05-22 DIAGNOSIS — Z992 Dependence on renal dialysis: Secondary | ICD-10-CM | POA: Diagnosis not present

## 2023-05-22 DIAGNOSIS — K769 Liver disease, unspecified: Secondary | ICD-10-CM | POA: Diagnosis not present

## 2023-05-22 DIAGNOSIS — D509 Iron deficiency anemia, unspecified: Secondary | ICD-10-CM | POA: Diagnosis not present

## 2023-05-23 DIAGNOSIS — D631 Anemia in chronic kidney disease: Secondary | ICD-10-CM | POA: Diagnosis not present

## 2023-05-23 DIAGNOSIS — N186 End stage renal disease: Secondary | ICD-10-CM | POA: Diagnosis not present

## 2023-05-23 DIAGNOSIS — N2581 Secondary hyperparathyroidism of renal origin: Secondary | ICD-10-CM | POA: Diagnosis not present

## 2023-05-23 DIAGNOSIS — D509 Iron deficiency anemia, unspecified: Secondary | ICD-10-CM | POA: Diagnosis not present

## 2023-05-23 DIAGNOSIS — K769 Liver disease, unspecified: Secondary | ICD-10-CM | POA: Diagnosis not present

## 2023-05-23 DIAGNOSIS — Z4932 Encounter for adequacy testing for peritoneal dialysis: Secondary | ICD-10-CM | POA: Diagnosis not present

## 2023-05-23 DIAGNOSIS — Z992 Dependence on renal dialysis: Secondary | ICD-10-CM | POA: Diagnosis not present

## 2023-05-24 DIAGNOSIS — N2581 Secondary hyperparathyroidism of renal origin: Secondary | ICD-10-CM | POA: Diagnosis not present

## 2023-05-24 DIAGNOSIS — Z4932 Encounter for adequacy testing for peritoneal dialysis: Secondary | ICD-10-CM | POA: Diagnosis not present

## 2023-05-24 DIAGNOSIS — K769 Liver disease, unspecified: Secondary | ICD-10-CM | POA: Diagnosis not present

## 2023-05-24 DIAGNOSIS — D631 Anemia in chronic kidney disease: Secondary | ICD-10-CM | POA: Diagnosis not present

## 2023-05-24 DIAGNOSIS — N186 End stage renal disease: Secondary | ICD-10-CM | POA: Diagnosis not present

## 2023-05-24 DIAGNOSIS — Z992 Dependence on renal dialysis: Secondary | ICD-10-CM | POA: Diagnosis not present

## 2023-05-24 DIAGNOSIS — D509 Iron deficiency anemia, unspecified: Secondary | ICD-10-CM | POA: Diagnosis not present

## 2023-05-25 DIAGNOSIS — K769 Liver disease, unspecified: Secondary | ICD-10-CM | POA: Diagnosis not present

## 2023-05-25 DIAGNOSIS — D509 Iron deficiency anemia, unspecified: Secondary | ICD-10-CM | POA: Diagnosis not present

## 2023-05-25 DIAGNOSIS — N2581 Secondary hyperparathyroidism of renal origin: Secondary | ICD-10-CM | POA: Diagnosis not present

## 2023-05-25 DIAGNOSIS — Z4932 Encounter for adequacy testing for peritoneal dialysis: Secondary | ICD-10-CM | POA: Diagnosis not present

## 2023-05-25 DIAGNOSIS — D631 Anemia in chronic kidney disease: Secondary | ICD-10-CM | POA: Diagnosis not present

## 2023-05-25 DIAGNOSIS — Z992 Dependence on renal dialysis: Secondary | ICD-10-CM | POA: Diagnosis not present

## 2023-05-25 DIAGNOSIS — N186 End stage renal disease: Secondary | ICD-10-CM | POA: Diagnosis not present

## 2023-05-26 DIAGNOSIS — N2581 Secondary hyperparathyroidism of renal origin: Secondary | ICD-10-CM | POA: Diagnosis not present

## 2023-05-26 DIAGNOSIS — K769 Liver disease, unspecified: Secondary | ICD-10-CM | POA: Diagnosis not present

## 2023-05-26 DIAGNOSIS — N186 End stage renal disease: Secondary | ICD-10-CM | POA: Diagnosis not present

## 2023-05-26 DIAGNOSIS — D509 Iron deficiency anemia, unspecified: Secondary | ICD-10-CM | POA: Diagnosis not present

## 2023-05-26 DIAGNOSIS — Z4932 Encounter for adequacy testing for peritoneal dialysis: Secondary | ICD-10-CM | POA: Diagnosis not present

## 2023-05-26 DIAGNOSIS — D631 Anemia in chronic kidney disease: Secondary | ICD-10-CM | POA: Diagnosis not present

## 2023-05-26 DIAGNOSIS — Z992 Dependence on renal dialysis: Secondary | ICD-10-CM | POA: Diagnosis not present

## 2023-05-27 DIAGNOSIS — D631 Anemia in chronic kidney disease: Secondary | ICD-10-CM | POA: Diagnosis not present

## 2023-05-27 DIAGNOSIS — Z992 Dependence on renal dialysis: Secondary | ICD-10-CM | POA: Diagnosis not present

## 2023-05-27 DIAGNOSIS — N2581 Secondary hyperparathyroidism of renal origin: Secondary | ICD-10-CM | POA: Diagnosis not present

## 2023-05-27 DIAGNOSIS — K769 Liver disease, unspecified: Secondary | ICD-10-CM | POA: Diagnosis not present

## 2023-05-27 DIAGNOSIS — D509 Iron deficiency anemia, unspecified: Secondary | ICD-10-CM | POA: Diagnosis not present

## 2023-05-27 DIAGNOSIS — Z4932 Encounter for adequacy testing for peritoneal dialysis: Secondary | ICD-10-CM | POA: Diagnosis not present

## 2023-05-27 DIAGNOSIS — N186 End stage renal disease: Secondary | ICD-10-CM | POA: Diagnosis not present

## 2023-05-28 DIAGNOSIS — Z4932 Encounter for adequacy testing for peritoneal dialysis: Secondary | ICD-10-CM | POA: Diagnosis not present

## 2023-05-28 DIAGNOSIS — D509 Iron deficiency anemia, unspecified: Secondary | ICD-10-CM | POA: Diagnosis not present

## 2023-05-28 DIAGNOSIS — N186 End stage renal disease: Secondary | ICD-10-CM | POA: Diagnosis not present

## 2023-05-28 DIAGNOSIS — Z992 Dependence on renal dialysis: Secondary | ICD-10-CM | POA: Diagnosis not present

## 2023-05-28 DIAGNOSIS — N2581 Secondary hyperparathyroidism of renal origin: Secondary | ICD-10-CM | POA: Diagnosis not present

## 2023-05-28 DIAGNOSIS — D631 Anemia in chronic kidney disease: Secondary | ICD-10-CM | POA: Diagnosis not present

## 2023-05-28 DIAGNOSIS — K769 Liver disease, unspecified: Secondary | ICD-10-CM | POA: Diagnosis not present

## 2023-05-29 DIAGNOSIS — D509 Iron deficiency anemia, unspecified: Secondary | ICD-10-CM | POA: Diagnosis not present

## 2023-05-29 DIAGNOSIS — K769 Liver disease, unspecified: Secondary | ICD-10-CM | POA: Diagnosis not present

## 2023-05-29 DIAGNOSIS — Z4932 Encounter for adequacy testing for peritoneal dialysis: Secondary | ICD-10-CM | POA: Diagnosis not present

## 2023-05-29 DIAGNOSIS — N186 End stage renal disease: Secondary | ICD-10-CM | POA: Diagnosis not present

## 2023-05-29 DIAGNOSIS — Z992 Dependence on renal dialysis: Secondary | ICD-10-CM | POA: Diagnosis not present

## 2023-05-29 DIAGNOSIS — D631 Anemia in chronic kidney disease: Secondary | ICD-10-CM | POA: Diagnosis not present

## 2023-05-29 DIAGNOSIS — N2581 Secondary hyperparathyroidism of renal origin: Secondary | ICD-10-CM | POA: Diagnosis not present

## 2023-05-30 DIAGNOSIS — N186 End stage renal disease: Secondary | ICD-10-CM | POA: Diagnosis not present

## 2023-05-30 DIAGNOSIS — D631 Anemia in chronic kidney disease: Secondary | ICD-10-CM | POA: Diagnosis not present

## 2023-05-30 DIAGNOSIS — K769 Liver disease, unspecified: Secondary | ICD-10-CM | POA: Diagnosis not present

## 2023-05-30 DIAGNOSIS — N2581 Secondary hyperparathyroidism of renal origin: Secondary | ICD-10-CM | POA: Diagnosis not present

## 2023-05-30 DIAGNOSIS — Z4932 Encounter for adequacy testing for peritoneal dialysis: Secondary | ICD-10-CM | POA: Diagnosis not present

## 2023-05-30 DIAGNOSIS — D509 Iron deficiency anemia, unspecified: Secondary | ICD-10-CM | POA: Diagnosis not present

## 2023-05-30 DIAGNOSIS — Z992 Dependence on renal dialysis: Secondary | ICD-10-CM | POA: Diagnosis not present

## 2023-05-31 DIAGNOSIS — D509 Iron deficiency anemia, unspecified: Secondary | ICD-10-CM | POA: Diagnosis not present

## 2023-05-31 DIAGNOSIS — Z992 Dependence on renal dialysis: Secondary | ICD-10-CM | POA: Diagnosis not present

## 2023-05-31 DIAGNOSIS — K769 Liver disease, unspecified: Secondary | ICD-10-CM | POA: Diagnosis not present

## 2023-05-31 DIAGNOSIS — Z4932 Encounter for adequacy testing for peritoneal dialysis: Secondary | ICD-10-CM | POA: Diagnosis not present

## 2023-05-31 DIAGNOSIS — N2581 Secondary hyperparathyroidism of renal origin: Secondary | ICD-10-CM | POA: Diagnosis not present

## 2023-05-31 DIAGNOSIS — N186 End stage renal disease: Secondary | ICD-10-CM | POA: Diagnosis not present

## 2023-05-31 DIAGNOSIS — D631 Anemia in chronic kidney disease: Secondary | ICD-10-CM | POA: Diagnosis not present

## 2023-06-01 DIAGNOSIS — K769 Liver disease, unspecified: Secondary | ICD-10-CM | POA: Diagnosis not present

## 2023-06-01 DIAGNOSIS — D509 Iron deficiency anemia, unspecified: Secondary | ICD-10-CM | POA: Diagnosis not present

## 2023-06-01 DIAGNOSIS — Z4932 Encounter for adequacy testing for peritoneal dialysis: Secondary | ICD-10-CM | POA: Diagnosis not present

## 2023-06-01 DIAGNOSIS — N2581 Secondary hyperparathyroidism of renal origin: Secondary | ICD-10-CM | POA: Diagnosis not present

## 2023-06-01 DIAGNOSIS — N186 End stage renal disease: Secondary | ICD-10-CM | POA: Diagnosis not present

## 2023-06-01 DIAGNOSIS — D631 Anemia in chronic kidney disease: Secondary | ICD-10-CM | POA: Diagnosis not present

## 2023-06-01 DIAGNOSIS — Z992 Dependence on renal dialysis: Secondary | ICD-10-CM | POA: Diagnosis not present

## 2023-06-02 DIAGNOSIS — D631 Anemia in chronic kidney disease: Secondary | ICD-10-CM | POA: Diagnosis not present

## 2023-06-02 DIAGNOSIS — D509 Iron deficiency anemia, unspecified: Secondary | ICD-10-CM | POA: Diagnosis not present

## 2023-06-02 DIAGNOSIS — N2581 Secondary hyperparathyroidism of renal origin: Secondary | ICD-10-CM | POA: Diagnosis not present

## 2023-06-02 DIAGNOSIS — K769 Liver disease, unspecified: Secondary | ICD-10-CM | POA: Diagnosis not present

## 2023-06-02 DIAGNOSIS — Z4932 Encounter for adequacy testing for peritoneal dialysis: Secondary | ICD-10-CM | POA: Diagnosis not present

## 2023-06-02 DIAGNOSIS — N186 End stage renal disease: Secondary | ICD-10-CM | POA: Diagnosis not present

## 2023-06-02 DIAGNOSIS — Z992 Dependence on renal dialysis: Secondary | ICD-10-CM | POA: Diagnosis not present

## 2023-06-03 DIAGNOSIS — N2581 Secondary hyperparathyroidism of renal origin: Secondary | ICD-10-CM | POA: Diagnosis not present

## 2023-06-03 DIAGNOSIS — N186 End stage renal disease: Secondary | ICD-10-CM | POA: Diagnosis not present

## 2023-06-03 DIAGNOSIS — D631 Anemia in chronic kidney disease: Secondary | ICD-10-CM | POA: Diagnosis not present

## 2023-06-03 DIAGNOSIS — D509 Iron deficiency anemia, unspecified: Secondary | ICD-10-CM | POA: Diagnosis not present

## 2023-06-03 DIAGNOSIS — Z4932 Encounter for adequacy testing for peritoneal dialysis: Secondary | ICD-10-CM | POA: Diagnosis not present

## 2023-06-03 DIAGNOSIS — K769 Liver disease, unspecified: Secondary | ICD-10-CM | POA: Diagnosis not present

## 2023-06-03 DIAGNOSIS — Z992 Dependence on renal dialysis: Secondary | ICD-10-CM | POA: Diagnosis not present

## 2023-06-04 DIAGNOSIS — N186 End stage renal disease: Secondary | ICD-10-CM | POA: Diagnosis not present

## 2023-06-04 DIAGNOSIS — K769 Liver disease, unspecified: Secondary | ICD-10-CM | POA: Diagnosis not present

## 2023-06-04 DIAGNOSIS — D631 Anemia in chronic kidney disease: Secondary | ICD-10-CM | POA: Diagnosis not present

## 2023-06-04 DIAGNOSIS — Z4932 Encounter for adequacy testing for peritoneal dialysis: Secondary | ICD-10-CM | POA: Diagnosis not present

## 2023-06-04 DIAGNOSIS — Z992 Dependence on renal dialysis: Secondary | ICD-10-CM | POA: Diagnosis not present

## 2023-06-04 DIAGNOSIS — N2581 Secondary hyperparathyroidism of renal origin: Secondary | ICD-10-CM | POA: Diagnosis not present

## 2023-06-04 DIAGNOSIS — D509 Iron deficiency anemia, unspecified: Secondary | ICD-10-CM | POA: Diagnosis not present

## 2023-06-05 DIAGNOSIS — Z992 Dependence on renal dialysis: Secondary | ICD-10-CM | POA: Diagnosis not present

## 2023-06-05 DIAGNOSIS — K769 Liver disease, unspecified: Secondary | ICD-10-CM | POA: Diagnosis not present

## 2023-06-05 DIAGNOSIS — N186 End stage renal disease: Secondary | ICD-10-CM | POA: Diagnosis not present

## 2023-06-05 DIAGNOSIS — Z4932 Encounter for adequacy testing for peritoneal dialysis: Secondary | ICD-10-CM | POA: Diagnosis not present

## 2023-06-05 DIAGNOSIS — D509 Iron deficiency anemia, unspecified: Secondary | ICD-10-CM | POA: Diagnosis not present

## 2023-06-05 DIAGNOSIS — D631 Anemia in chronic kidney disease: Secondary | ICD-10-CM | POA: Diagnosis not present

## 2023-06-05 DIAGNOSIS — N2581 Secondary hyperparathyroidism of renal origin: Secondary | ICD-10-CM | POA: Diagnosis not present

## 2023-06-06 DIAGNOSIS — Z992 Dependence on renal dialysis: Secondary | ICD-10-CM | POA: Diagnosis not present

## 2023-06-06 DIAGNOSIS — K769 Liver disease, unspecified: Secondary | ICD-10-CM | POA: Diagnosis not present

## 2023-06-06 DIAGNOSIS — N186 End stage renal disease: Secondary | ICD-10-CM | POA: Diagnosis not present

## 2023-06-06 DIAGNOSIS — N2581 Secondary hyperparathyroidism of renal origin: Secondary | ICD-10-CM | POA: Diagnosis not present

## 2023-06-06 DIAGNOSIS — D631 Anemia in chronic kidney disease: Secondary | ICD-10-CM | POA: Diagnosis not present

## 2023-06-06 DIAGNOSIS — Z4932 Encounter for adequacy testing for peritoneal dialysis: Secondary | ICD-10-CM | POA: Diagnosis not present

## 2023-06-06 DIAGNOSIS — D509 Iron deficiency anemia, unspecified: Secondary | ICD-10-CM | POA: Diagnosis not present

## 2023-06-07 DIAGNOSIS — N186 End stage renal disease: Secondary | ICD-10-CM | POA: Diagnosis not present

## 2023-06-07 DIAGNOSIS — N2581 Secondary hyperparathyroidism of renal origin: Secondary | ICD-10-CM | POA: Diagnosis not present

## 2023-06-07 DIAGNOSIS — D631 Anemia in chronic kidney disease: Secondary | ICD-10-CM | POA: Diagnosis not present

## 2023-06-07 DIAGNOSIS — D509 Iron deficiency anemia, unspecified: Secondary | ICD-10-CM | POA: Diagnosis not present

## 2023-06-07 DIAGNOSIS — Z4932 Encounter for adequacy testing for peritoneal dialysis: Secondary | ICD-10-CM | POA: Diagnosis not present

## 2023-06-07 DIAGNOSIS — K769 Liver disease, unspecified: Secondary | ICD-10-CM | POA: Diagnosis not present

## 2023-06-07 DIAGNOSIS — Z992 Dependence on renal dialysis: Secondary | ICD-10-CM | POA: Diagnosis not present

## 2023-06-08 DIAGNOSIS — Z4932 Encounter for adequacy testing for peritoneal dialysis: Secondary | ICD-10-CM | POA: Diagnosis not present

## 2023-06-08 DIAGNOSIS — Z992 Dependence on renal dialysis: Secondary | ICD-10-CM | POA: Diagnosis not present

## 2023-06-08 DIAGNOSIS — K769 Liver disease, unspecified: Secondary | ICD-10-CM | POA: Diagnosis not present

## 2023-06-08 DIAGNOSIS — D631 Anemia in chronic kidney disease: Secondary | ICD-10-CM | POA: Diagnosis not present

## 2023-06-08 DIAGNOSIS — N2581 Secondary hyperparathyroidism of renal origin: Secondary | ICD-10-CM | POA: Diagnosis not present

## 2023-06-08 DIAGNOSIS — N186 End stage renal disease: Secondary | ICD-10-CM | POA: Diagnosis not present

## 2023-06-08 DIAGNOSIS — D509 Iron deficiency anemia, unspecified: Secondary | ICD-10-CM | POA: Diagnosis not present

## 2023-06-09 DIAGNOSIS — D631 Anemia in chronic kidney disease: Secondary | ICD-10-CM | POA: Diagnosis not present

## 2023-06-09 DIAGNOSIS — N2581 Secondary hyperparathyroidism of renal origin: Secondary | ICD-10-CM | POA: Diagnosis not present

## 2023-06-09 DIAGNOSIS — Z4932 Encounter for adequacy testing for peritoneal dialysis: Secondary | ICD-10-CM | POA: Diagnosis not present

## 2023-06-09 DIAGNOSIS — K769 Liver disease, unspecified: Secondary | ICD-10-CM | POA: Diagnosis not present

## 2023-06-09 DIAGNOSIS — Z992 Dependence on renal dialysis: Secondary | ICD-10-CM | POA: Diagnosis not present

## 2023-06-09 DIAGNOSIS — N186 End stage renal disease: Secondary | ICD-10-CM | POA: Diagnosis not present

## 2023-06-09 DIAGNOSIS — D509 Iron deficiency anemia, unspecified: Secondary | ICD-10-CM | POA: Diagnosis not present

## 2023-06-10 DIAGNOSIS — Z4932 Encounter for adequacy testing for peritoneal dialysis: Secondary | ICD-10-CM | POA: Diagnosis not present

## 2023-06-10 DIAGNOSIS — N2581 Secondary hyperparathyroidism of renal origin: Secondary | ICD-10-CM | POA: Diagnosis not present

## 2023-06-10 DIAGNOSIS — D509 Iron deficiency anemia, unspecified: Secondary | ICD-10-CM | POA: Diagnosis not present

## 2023-06-10 DIAGNOSIS — K769 Liver disease, unspecified: Secondary | ICD-10-CM | POA: Diagnosis not present

## 2023-06-10 DIAGNOSIS — Z992 Dependence on renal dialysis: Secondary | ICD-10-CM | POA: Diagnosis not present

## 2023-06-10 DIAGNOSIS — N186 End stage renal disease: Secondary | ICD-10-CM | POA: Diagnosis not present

## 2023-06-10 DIAGNOSIS — D631 Anemia in chronic kidney disease: Secondary | ICD-10-CM | POA: Diagnosis not present

## 2023-06-11 DIAGNOSIS — D631 Anemia in chronic kidney disease: Secondary | ICD-10-CM | POA: Diagnosis not present

## 2023-06-11 DIAGNOSIS — K769 Liver disease, unspecified: Secondary | ICD-10-CM | POA: Diagnosis not present

## 2023-06-11 DIAGNOSIS — N2581 Secondary hyperparathyroidism of renal origin: Secondary | ICD-10-CM | POA: Diagnosis not present

## 2023-06-11 DIAGNOSIS — Z4932 Encounter for adequacy testing for peritoneal dialysis: Secondary | ICD-10-CM | POA: Diagnosis not present

## 2023-06-11 DIAGNOSIS — Z992 Dependence on renal dialysis: Secondary | ICD-10-CM | POA: Diagnosis not present

## 2023-06-11 DIAGNOSIS — D509 Iron deficiency anemia, unspecified: Secondary | ICD-10-CM | POA: Diagnosis not present

## 2023-06-11 DIAGNOSIS — N186 End stage renal disease: Secondary | ICD-10-CM | POA: Diagnosis not present

## 2023-06-12 DIAGNOSIS — Z992 Dependence on renal dialysis: Secondary | ICD-10-CM | POA: Diagnosis not present

## 2023-06-12 DIAGNOSIS — K219 Gastro-esophageal reflux disease without esophagitis: Secondary | ICD-10-CM | POA: Diagnosis not present

## 2023-06-12 DIAGNOSIS — M109 Gout, unspecified: Secondary | ICD-10-CM | POA: Diagnosis not present

## 2023-06-12 DIAGNOSIS — I4891 Unspecified atrial fibrillation: Secondary | ICD-10-CM | POA: Diagnosis not present

## 2023-06-12 DIAGNOSIS — I499 Cardiac arrhythmia, unspecified: Secondary | ICD-10-CM | POA: Diagnosis not present

## 2023-06-12 DIAGNOSIS — I129 Hypertensive chronic kidney disease with stage 1 through stage 4 chronic kidney disease, or unspecified chronic kidney disease: Secondary | ICD-10-CM | POA: Diagnosis not present

## 2023-06-12 DIAGNOSIS — Z4932 Encounter for adequacy testing for peritoneal dialysis: Secondary | ICD-10-CM | POA: Diagnosis not present

## 2023-06-12 DIAGNOSIS — N186 End stage renal disease: Secondary | ICD-10-CM | POA: Diagnosis not present

## 2023-06-12 DIAGNOSIS — N2581 Secondary hyperparathyroidism of renal origin: Secondary | ICD-10-CM | POA: Diagnosis not present

## 2023-06-12 DIAGNOSIS — Z0000 Encounter for general adult medical examination without abnormal findings: Secondary | ICD-10-CM | POA: Diagnosis not present

## 2023-06-12 DIAGNOSIS — D509 Iron deficiency anemia, unspecified: Secondary | ICD-10-CM | POA: Diagnosis not present

## 2023-06-12 DIAGNOSIS — D631 Anemia in chronic kidney disease: Secondary | ICD-10-CM | POA: Diagnosis not present

## 2023-06-12 DIAGNOSIS — K769 Liver disease, unspecified: Secondary | ICD-10-CM | POA: Diagnosis not present

## 2023-06-13 DIAGNOSIS — N186 End stage renal disease: Secondary | ICD-10-CM | POA: Diagnosis not present

## 2023-06-13 DIAGNOSIS — D631 Anemia in chronic kidney disease: Secondary | ICD-10-CM | POA: Diagnosis not present

## 2023-06-13 DIAGNOSIS — K769 Liver disease, unspecified: Secondary | ICD-10-CM | POA: Diagnosis not present

## 2023-06-13 DIAGNOSIS — Z992 Dependence on renal dialysis: Secondary | ICD-10-CM | POA: Diagnosis not present

## 2023-06-13 DIAGNOSIS — N2581 Secondary hyperparathyroidism of renal origin: Secondary | ICD-10-CM | POA: Diagnosis not present

## 2023-06-13 DIAGNOSIS — D509 Iron deficiency anemia, unspecified: Secondary | ICD-10-CM | POA: Diagnosis not present

## 2023-06-13 DIAGNOSIS — Z4932 Encounter for adequacy testing for peritoneal dialysis: Secondary | ICD-10-CM | POA: Diagnosis not present

## 2023-06-13 DIAGNOSIS — R8290 Unspecified abnormal findings in urine: Secondary | ICD-10-CM | POA: Diagnosis not present

## 2023-06-14 DIAGNOSIS — D631 Anemia in chronic kidney disease: Secondary | ICD-10-CM | POA: Diagnosis not present

## 2023-06-14 DIAGNOSIS — D509 Iron deficiency anemia, unspecified: Secondary | ICD-10-CM | POA: Diagnosis not present

## 2023-06-14 DIAGNOSIS — N2581 Secondary hyperparathyroidism of renal origin: Secondary | ICD-10-CM | POA: Diagnosis not present

## 2023-06-14 DIAGNOSIS — K769 Liver disease, unspecified: Secondary | ICD-10-CM | POA: Diagnosis not present

## 2023-06-14 DIAGNOSIS — Z4932 Encounter for adequacy testing for peritoneal dialysis: Secondary | ICD-10-CM | POA: Diagnosis not present

## 2023-06-14 DIAGNOSIS — Z992 Dependence on renal dialysis: Secondary | ICD-10-CM | POA: Diagnosis not present

## 2023-06-14 DIAGNOSIS — R8290 Unspecified abnormal findings in urine: Secondary | ICD-10-CM | POA: Diagnosis not present

## 2023-06-14 DIAGNOSIS — N186 End stage renal disease: Secondary | ICD-10-CM | POA: Diagnosis not present

## 2023-06-15 DIAGNOSIS — Z992 Dependence on renal dialysis: Secondary | ICD-10-CM | POA: Diagnosis not present

## 2023-06-15 DIAGNOSIS — N186 End stage renal disease: Secondary | ICD-10-CM | POA: Diagnosis not present

## 2023-06-15 DIAGNOSIS — Z4932 Encounter for adequacy testing for peritoneal dialysis: Secondary | ICD-10-CM | POA: Diagnosis not present

## 2023-06-15 DIAGNOSIS — R8290 Unspecified abnormal findings in urine: Secondary | ICD-10-CM | POA: Diagnosis not present

## 2023-06-15 DIAGNOSIS — D509 Iron deficiency anemia, unspecified: Secondary | ICD-10-CM | POA: Diagnosis not present

## 2023-06-15 DIAGNOSIS — N2581 Secondary hyperparathyroidism of renal origin: Secondary | ICD-10-CM | POA: Diagnosis not present

## 2023-06-15 DIAGNOSIS — K769 Liver disease, unspecified: Secondary | ICD-10-CM | POA: Diagnosis not present

## 2023-06-15 DIAGNOSIS — D631 Anemia in chronic kidney disease: Secondary | ICD-10-CM | POA: Diagnosis not present

## 2023-06-16 DIAGNOSIS — N186 End stage renal disease: Secondary | ICD-10-CM | POA: Diagnosis not present

## 2023-06-16 DIAGNOSIS — Z4932 Encounter for adequacy testing for peritoneal dialysis: Secondary | ICD-10-CM | POA: Diagnosis not present

## 2023-06-16 DIAGNOSIS — R8290 Unspecified abnormal findings in urine: Secondary | ICD-10-CM | POA: Diagnosis not present

## 2023-06-16 DIAGNOSIS — K769 Liver disease, unspecified: Secondary | ICD-10-CM | POA: Diagnosis not present

## 2023-06-16 DIAGNOSIS — D631 Anemia in chronic kidney disease: Secondary | ICD-10-CM | POA: Diagnosis not present

## 2023-06-16 DIAGNOSIS — Z992 Dependence on renal dialysis: Secondary | ICD-10-CM | POA: Diagnosis not present

## 2023-06-16 DIAGNOSIS — N2581 Secondary hyperparathyroidism of renal origin: Secondary | ICD-10-CM | POA: Diagnosis not present

## 2023-06-16 DIAGNOSIS — D509 Iron deficiency anemia, unspecified: Secondary | ICD-10-CM | POA: Diagnosis not present

## 2023-06-17 DIAGNOSIS — D509 Iron deficiency anemia, unspecified: Secondary | ICD-10-CM | POA: Diagnosis not present

## 2023-06-17 DIAGNOSIS — N2581 Secondary hyperparathyroidism of renal origin: Secondary | ICD-10-CM | POA: Diagnosis not present

## 2023-06-17 DIAGNOSIS — K769 Liver disease, unspecified: Secondary | ICD-10-CM | POA: Diagnosis not present

## 2023-06-17 DIAGNOSIS — R8290 Unspecified abnormal findings in urine: Secondary | ICD-10-CM | POA: Diagnosis not present

## 2023-06-17 DIAGNOSIS — Z992 Dependence on renal dialysis: Secondary | ICD-10-CM | POA: Diagnosis not present

## 2023-06-17 DIAGNOSIS — N186 End stage renal disease: Secondary | ICD-10-CM | POA: Diagnosis not present

## 2023-06-17 DIAGNOSIS — D631 Anemia in chronic kidney disease: Secondary | ICD-10-CM | POA: Diagnosis not present

## 2023-06-17 DIAGNOSIS — Z4932 Encounter for adequacy testing for peritoneal dialysis: Secondary | ICD-10-CM | POA: Diagnosis not present

## 2023-06-18 DIAGNOSIS — D631 Anemia in chronic kidney disease: Secondary | ICD-10-CM | POA: Diagnosis not present

## 2023-06-18 DIAGNOSIS — D509 Iron deficiency anemia, unspecified: Secondary | ICD-10-CM | POA: Diagnosis not present

## 2023-06-18 DIAGNOSIS — K769 Liver disease, unspecified: Secondary | ICD-10-CM | POA: Diagnosis not present

## 2023-06-18 DIAGNOSIS — Z992 Dependence on renal dialysis: Secondary | ICD-10-CM | POA: Diagnosis not present

## 2023-06-18 DIAGNOSIS — R8290 Unspecified abnormal findings in urine: Secondary | ICD-10-CM | POA: Diagnosis not present

## 2023-06-18 DIAGNOSIS — N186 End stage renal disease: Secondary | ICD-10-CM | POA: Diagnosis not present

## 2023-06-18 DIAGNOSIS — N2581 Secondary hyperparathyroidism of renal origin: Secondary | ICD-10-CM | POA: Diagnosis not present

## 2023-06-18 DIAGNOSIS — Z4932 Encounter for adequacy testing for peritoneal dialysis: Secondary | ICD-10-CM | POA: Diagnosis not present

## 2023-06-19 DIAGNOSIS — N186 End stage renal disease: Secondary | ICD-10-CM | POA: Diagnosis not present

## 2023-06-19 DIAGNOSIS — N2581 Secondary hyperparathyroidism of renal origin: Secondary | ICD-10-CM | POA: Diagnosis not present

## 2023-06-19 DIAGNOSIS — R8290 Unspecified abnormal findings in urine: Secondary | ICD-10-CM | POA: Diagnosis not present

## 2023-06-19 DIAGNOSIS — K769 Liver disease, unspecified: Secondary | ICD-10-CM | POA: Diagnosis not present

## 2023-06-19 DIAGNOSIS — Z992 Dependence on renal dialysis: Secondary | ICD-10-CM | POA: Diagnosis not present

## 2023-06-19 DIAGNOSIS — Z4932 Encounter for adequacy testing for peritoneal dialysis: Secondary | ICD-10-CM | POA: Diagnosis not present

## 2023-06-19 DIAGNOSIS — D509 Iron deficiency anemia, unspecified: Secondary | ICD-10-CM | POA: Diagnosis not present

## 2023-06-19 DIAGNOSIS — D631 Anemia in chronic kidney disease: Secondary | ICD-10-CM | POA: Diagnosis not present

## 2023-06-20 DIAGNOSIS — K769 Liver disease, unspecified: Secondary | ICD-10-CM | POA: Diagnosis not present

## 2023-06-20 DIAGNOSIS — D631 Anemia in chronic kidney disease: Secondary | ICD-10-CM | POA: Diagnosis not present

## 2023-06-20 DIAGNOSIS — Z992 Dependence on renal dialysis: Secondary | ICD-10-CM | POA: Diagnosis not present

## 2023-06-20 DIAGNOSIS — N186 End stage renal disease: Secondary | ICD-10-CM | POA: Diagnosis not present

## 2023-06-20 DIAGNOSIS — D509 Iron deficiency anemia, unspecified: Secondary | ICD-10-CM | POA: Diagnosis not present

## 2023-06-20 DIAGNOSIS — R8290 Unspecified abnormal findings in urine: Secondary | ICD-10-CM | POA: Diagnosis not present

## 2023-06-20 DIAGNOSIS — Z4932 Encounter for adequacy testing for peritoneal dialysis: Secondary | ICD-10-CM | POA: Diagnosis not present

## 2023-06-20 DIAGNOSIS — N2581 Secondary hyperparathyroidism of renal origin: Secondary | ICD-10-CM | POA: Diagnosis not present

## 2023-06-21 ENCOUNTER — Ambulatory Visit: Payer: Medicare Other | Admitting: Internal Medicine

## 2023-06-21 VITALS — BP 112/72 | HR 103 | Ht 70.0 in | Wt 135.6 lb

## 2023-06-21 DIAGNOSIS — N186 End stage renal disease: Secondary | ICD-10-CM | POA: Diagnosis not present

## 2023-06-21 DIAGNOSIS — Z4932 Encounter for adequacy testing for peritoneal dialysis: Secondary | ICD-10-CM | POA: Diagnosis not present

## 2023-06-21 DIAGNOSIS — I4891 Unspecified atrial fibrillation: Secondary | ICD-10-CM | POA: Diagnosis not present

## 2023-06-21 DIAGNOSIS — K769 Liver disease, unspecified: Secondary | ICD-10-CM | POA: Diagnosis not present

## 2023-06-21 DIAGNOSIS — Z992 Dependence on renal dialysis: Secondary | ICD-10-CM | POA: Diagnosis not present

## 2023-06-21 DIAGNOSIS — D509 Iron deficiency anemia, unspecified: Secondary | ICD-10-CM | POA: Diagnosis not present

## 2023-06-21 DIAGNOSIS — D631 Anemia in chronic kidney disease: Secondary | ICD-10-CM | POA: Diagnosis not present

## 2023-06-21 DIAGNOSIS — R8290 Unspecified abnormal findings in urine: Secondary | ICD-10-CM | POA: Diagnosis not present

## 2023-06-21 DIAGNOSIS — N2581 Secondary hyperparathyroidism of renal origin: Secondary | ICD-10-CM | POA: Diagnosis not present

## 2023-06-21 NOTE — Progress Notes (Signed)
Cardiology Office Note:    Date:  06/21/2023   ID:  Shaun Murillo, DOB 1932/07/15, MRN [id number]  PCP:  Irven Coe, MD   [facility] Providers Cardiologist:  Maisie Fus, MD     Referring MD: Irven Coe, MD   No chief complaint on file. PAF  History of Present Illness:    Shaun Murillo is a 89 y.o. male with a hx of ESRD on peritoneal dialysis,right parotid mass s/p resection (2017),  HTN, referral for new onset afib  Per EHR documentation: " Patient noted to have an irregular heartbeat on physical exam. EKG was obtained that did show signs of A-fib. Patient was started on Eliquis and urgent referral started to cardiology." EKG showed irregular narrow complex rhythm, baseline is poor but agree looks like afib.   Today he is in afib. He is here wit his brother who reported the history for him. He is hard of hearing. Overall, his health has deteriorated. He cannot eat large meals, ever since he started peritoneal dialysis. He is losing weight. His blood pressure has been low and amlodipine was held.  No prior coronary dx hx  Past Medical History:  Diagnosis Date   Arthritis    Chronic kidney disease (CKD), stage IV (severe) (HCC)    Complication of anesthesia    Hard of hearing    Hypertension    Kidney stones    PONV (postoperative nausea and vomiting)     Past Surgical History:  Procedure Laterality Date   BACK SURGERY     COLONOSCOPY     ESOPHAGOGASTRODUODENOSCOPY     EYE SURGERY     cataracts bilateral   PAROTIDECTOMY Right 07/18/2016   Procedure: RIGHT SUPERFICIAL PAROTIDECTOMY WITH NERVE DISSECTION;  Surgeon: Christia Reading, MD;  Location: [facility] OR;  Service: ENT;  Laterality: Right;  RIGHT PAROTIDECTOMY WITH SELECTIVE NECK DISSECTION   SHOULDER SURGERY Right     Current Medications: Current Outpatient Medications on File Prior to Visit  Medication Sig Dispense Refill   allopurinol (ZYLOPRIM) 100 MG tablet Take 100 mg by mouth every Monday, Wednesday,  and Friday. Takes in the evening     AURYXIA 1 GM 210 MG(Fe) tablet Take 210 mg by mouth 2 (two) times daily with a meal.     calcitRIOL (ROCALTROL) 0.25 MCG capsule Take 0.25 mcg by mouth every Tuesday, Thursday, and Saturday at 6 PM. In the evening     ferrous sulfate 325 (65 FE) MG tablet Take 325 mg by mouth in the morning.     furosemide (LASIX) 40 MG tablet Take 80 mg by mouth every morning.     hydrALAZINE (APRESOLINE) 25 MG tablet Take 25 mg by mouth in the morning and at bedtime.     omeprazole (PRILOSEC) 40 MG capsule Take 40 mg by mouth every morning.     amLODipine (NORVASC) 5 MG tablet Take 5 mg by mouth at bedtime. (Patient not taking: Reported on 06/21/2023)     No current facility-administered medications on file prior to visit.     Allergies:   Codeine and Penicillins   Social History   Socioeconomic History   Marital status: Widowed    Spouse name: Not on file   Number of children: Not on file   Years of education: Not on file   Highest education level: Not on file  Occupational History   Not on file  Tobacco Use   Smoking status: Never   Smokeless tobacco: Never  Substance  and Sexual Activity   Alcohol use: No   Drug use: No   Sexual activity: Not on file  Other Topics Concern   Not on file  Social History Narrative   Not on file   Social Determinants of Health   Financial Resource Strain: Not on file  Food Insecurity: Not on file  Transportation Needs: Not on file  Physical Activity: Not on file  Stress: Not on file  Social Connections: Unknown (02/25/2023)   [facility]   Social Network    Social Network: Not on file     Family History: NA  ROS:   Please see the history of present illness.     All other systems reviewed and are negative.  EKGs/Labs/Other Studies Reviewed:    The following studies were reviewed today:  TTE 01/19/2023- shows EF 55-60%, mild LVH, Rv is normal, mild AI,  LA 34 cc/m2 EKG  Interpretation Date/Time:  Friday June 21 2023 13:37:34 EDT Ventricular Rate:  103 PR Interval:    QRS Duration:  66 QT Interval:  302 QTC Calculation: 395 R Axis:   -6  Text Interpretation: Atrial fibrillation with rapid ventricular response Cannot rule out Inferior infarct , age undetermined When compared with ECG of 18-Jan-2023 19:44, PREVIOUS ECG IS PRESENT Confirmed by Carolan Clines ([id number]) on 06/21/2023 1:42:45 PM    Recent Labs: 01/15/2023: ALT 33 01/18/2023: B Natriuretic Peptide 404.8 01/20/2023: BUN 63; Creatinine, Ser 10.70; Hemoglobin 10.4; Platelets 294; Potassium 3.5; Sodium 133   Recent Lipid Panel No results found for: "CHOL", "TRIG", "HDL", "CHOLHDL", "VLDL", "LDLCALC", "LDLDIRECT"   Risk Assessment/Calculations:    CHA2DS2-VASc Score = 3   This indicates a 3.2% annual risk of stroke. The patient's score is based upon: CHF History: 0 HTN History: 1 Diabetes History: 0 Stroke History: 0 Vascular Disease History: 0 Age Score: 2 Gender Score: 0     Physical Exam:    VS:  BP 112/72   Pulse (!) 103   Ht 5\' 10"  (1.778 m)   Wt 135 lb 9.6 oz (61.5 kg)   SpO2 98%   BMI 19.46 kg/m     Wt Readings from Last 3 Encounters:  06/21/23 135 lb 9.6 oz (61.5 kg)  01/20/23 135 lb 12.8 oz (61.6 kg)  01/15/23 151 lb 0.2 oz (68.5 kg)     GEN:  Well nourished, well developed in no acute distress HEENT: Normal NECK: No JVD CARDIAC: IRRR, no murmurs, rubs, gallops RESPIRATORY:  Clear to auscultation without rales, wheezing or rhonchi  ABDOMEN: Soft, non-tender, non-distended MUSCULOSKELETAL:  No edema; No deformity  SKIN: Warm and dry NEUROLOGIC:  Alert and oriented x 3 PSYCHIATRIC:  Normal affect   ASSESSMENT:    PAF:  - rate controlled  - continue eliquis - had some low Bps, will hold off on BB for now - he is quite frail, don't want to be too aggressive. He has lost a lot of weight.  No plans for PVI. AAD choice for him would be amiodarone if he had issues with  afib with RVR. Would need TSH and CMP if starting amio   ESRD: UF per pertoneal dialysis. On lasix 80 mg daily  HTN:  -norvasc is held 2/2 hypotension. Hydralazine 25 mg BID PLAN:    In order of problems listed above:  No changes Follow up 6 months      Medication Adjustments/Labs and Tests Ordered: Current medicines are reviewed at length with the patient today.  Concerns  regarding medicines are outlined above.  Orders Placed This Encounter  Procedures   EKG 12-Lead   No orders of the defined types were placed in this encounter.   Patient Instructions  Medication Instructions:  Your physician recommends that you continue on your current medications as directed. Please refer to the Current Medication list given to you today.  *If you need a refill on your cardiac medications before your next appointment, please call your pharmacy*    Follow-Up: At [facility], you and your health needs are our priority.  As part of our continuing mission to provide you with exceptional heart care, we have created designated Provider Care Teams.  These Care Teams include your primary Cardiologist (physician) and Advanced Practice Providers (APPs -  Physician Assistants and Nurse Practitioners) who all work together to provide you with the care you need, when you need it.   Your next appointment:   6 month(s)  Provider:   Maisie Fus, MD     Signed, Maisie Fus, MD  06/21/2023 3:38 PM    [facility]

## 2023-06-21 NOTE — Patient Instructions (Signed)
Medication Instructions:  Your physician recommends that you continue on your current medications as directed. Please refer to the Current Medication list given to you today.  *If you need a refill on your cardiac medications before your next appointment, please call your pharmacy*  Follow-Up: At [facility], you and your health needs are our priority.  As part of our continuing mission to provide you with exceptional heart care, we have created designated Provider Care Teams.  These Care Teams include your primary Cardiologist (physician) and Advanced Practice Providers (APPs -  Physician Assistants and Nurse Practitioners) who all work together to provide you with the care you need, when you need it.    Your next appointment:   6 month(s)  Provider:   Maisie Fus, MD

## 2023-06-22 DIAGNOSIS — D631 Anemia in chronic kidney disease: Secondary | ICD-10-CM | POA: Diagnosis not present

## 2023-06-22 DIAGNOSIS — D509 Iron deficiency anemia, unspecified: Secondary | ICD-10-CM | POA: Diagnosis not present

## 2023-06-22 DIAGNOSIS — N2581 Secondary hyperparathyroidism of renal origin: Secondary | ICD-10-CM | POA: Diagnosis not present

## 2023-06-22 DIAGNOSIS — Z992 Dependence on renal dialysis: Secondary | ICD-10-CM | POA: Diagnosis not present

## 2023-06-22 DIAGNOSIS — K769 Liver disease, unspecified: Secondary | ICD-10-CM | POA: Diagnosis not present

## 2023-06-22 DIAGNOSIS — R8290 Unspecified abnormal findings in urine: Secondary | ICD-10-CM | POA: Diagnosis not present

## 2023-06-22 DIAGNOSIS — Z4932 Encounter for adequacy testing for peritoneal dialysis: Secondary | ICD-10-CM | POA: Diagnosis not present

## 2023-06-22 DIAGNOSIS — N186 End stage renal disease: Secondary | ICD-10-CM | POA: Diagnosis not present

## 2023-06-23 DIAGNOSIS — R8290 Unspecified abnormal findings in urine: Secondary | ICD-10-CM | POA: Diagnosis not present

## 2023-06-23 DIAGNOSIS — K769 Liver disease, unspecified: Secondary | ICD-10-CM | POA: Diagnosis not present

## 2023-06-23 DIAGNOSIS — D631 Anemia in chronic kidney disease: Secondary | ICD-10-CM | POA: Diagnosis not present

## 2023-06-23 DIAGNOSIS — N186 End stage renal disease: Secondary | ICD-10-CM | POA: Diagnosis not present

## 2023-06-23 DIAGNOSIS — N2581 Secondary hyperparathyroidism of renal origin: Secondary | ICD-10-CM | POA: Diagnosis not present

## 2023-06-23 DIAGNOSIS — Z992 Dependence on renal dialysis: Secondary | ICD-10-CM | POA: Diagnosis not present

## 2023-06-23 DIAGNOSIS — D509 Iron deficiency anemia, unspecified: Secondary | ICD-10-CM | POA: Diagnosis not present

## 2023-06-23 DIAGNOSIS — Z4932 Encounter for adequacy testing for peritoneal dialysis: Secondary | ICD-10-CM | POA: Diagnosis not present

## 2023-06-24 DIAGNOSIS — N186 End stage renal disease: Secondary | ICD-10-CM | POA: Diagnosis not present

## 2023-06-24 DIAGNOSIS — Z992 Dependence on renal dialysis: Secondary | ICD-10-CM | POA: Diagnosis not present

## 2023-06-24 DIAGNOSIS — D631 Anemia in chronic kidney disease: Secondary | ICD-10-CM | POA: Diagnosis not present

## 2023-06-24 DIAGNOSIS — D509 Iron deficiency anemia, unspecified: Secondary | ICD-10-CM | POA: Diagnosis not present

## 2023-06-24 DIAGNOSIS — N2581 Secondary hyperparathyroidism of renal origin: Secondary | ICD-10-CM | POA: Diagnosis not present

## 2023-06-24 DIAGNOSIS — Z4932 Encounter for adequacy testing for peritoneal dialysis: Secondary | ICD-10-CM | POA: Diagnosis not present

## 2023-06-24 DIAGNOSIS — K769 Liver disease, unspecified: Secondary | ICD-10-CM | POA: Diagnosis not present

## 2023-06-24 DIAGNOSIS — R8290 Unspecified abnormal findings in urine: Secondary | ICD-10-CM | POA: Diagnosis not present

## 2023-06-25 DIAGNOSIS — N2581 Secondary hyperparathyroidism of renal origin: Secondary | ICD-10-CM | POA: Diagnosis not present

## 2023-06-25 DIAGNOSIS — R8290 Unspecified abnormal findings in urine: Secondary | ICD-10-CM | POA: Diagnosis not present

## 2023-06-25 DIAGNOSIS — D631 Anemia in chronic kidney disease: Secondary | ICD-10-CM | POA: Diagnosis not present

## 2023-06-25 DIAGNOSIS — Z992 Dependence on renal dialysis: Secondary | ICD-10-CM | POA: Diagnosis not present

## 2023-06-25 DIAGNOSIS — Z4932 Encounter for adequacy testing for peritoneal dialysis: Secondary | ICD-10-CM | POA: Diagnosis not present

## 2023-06-25 DIAGNOSIS — K769 Liver disease, unspecified: Secondary | ICD-10-CM | POA: Diagnosis not present

## 2023-06-25 DIAGNOSIS — N186 End stage renal disease: Secondary | ICD-10-CM | POA: Diagnosis not present

## 2023-06-25 DIAGNOSIS — D509 Iron deficiency anemia, unspecified: Secondary | ICD-10-CM | POA: Diagnosis not present

## 2023-06-26 DIAGNOSIS — R8290 Unspecified abnormal findings in urine: Secondary | ICD-10-CM | POA: Diagnosis not present

## 2023-06-26 DIAGNOSIS — Z992 Dependence on renal dialysis: Secondary | ICD-10-CM | POA: Diagnosis not present

## 2023-06-26 DIAGNOSIS — N2581 Secondary hyperparathyroidism of renal origin: Secondary | ICD-10-CM | POA: Diagnosis not present

## 2023-06-26 DIAGNOSIS — K769 Liver disease, unspecified: Secondary | ICD-10-CM | POA: Diagnosis not present

## 2023-06-26 DIAGNOSIS — N186 End stage renal disease: Secondary | ICD-10-CM | POA: Diagnosis not present

## 2023-06-26 DIAGNOSIS — D631 Anemia in chronic kidney disease: Secondary | ICD-10-CM | POA: Diagnosis not present

## 2023-06-26 DIAGNOSIS — D509 Iron deficiency anemia, unspecified: Secondary | ICD-10-CM | POA: Diagnosis not present

## 2023-06-26 DIAGNOSIS — Z4932 Encounter for adequacy testing for peritoneal dialysis: Secondary | ICD-10-CM | POA: Diagnosis not present

## 2023-06-27 DIAGNOSIS — Z4932 Encounter for adequacy testing for peritoneal dialysis: Secondary | ICD-10-CM | POA: Diagnosis not present

## 2023-06-27 DIAGNOSIS — Z992 Dependence on renal dialysis: Secondary | ICD-10-CM | POA: Diagnosis not present

## 2023-06-27 DIAGNOSIS — R8290 Unspecified abnormal findings in urine: Secondary | ICD-10-CM | POA: Diagnosis not present

## 2023-06-27 DIAGNOSIS — N186 End stage renal disease: Secondary | ICD-10-CM | POA: Diagnosis not present

## 2023-06-27 DIAGNOSIS — D509 Iron deficiency anemia, unspecified: Secondary | ICD-10-CM | POA: Diagnosis not present

## 2023-06-27 DIAGNOSIS — N2581 Secondary hyperparathyroidism of renal origin: Secondary | ICD-10-CM | POA: Diagnosis not present

## 2023-06-27 DIAGNOSIS — K769 Liver disease, unspecified: Secondary | ICD-10-CM | POA: Diagnosis not present

## 2023-06-27 DIAGNOSIS — D631 Anemia in chronic kidney disease: Secondary | ICD-10-CM | POA: Diagnosis not present

## 2023-06-28 DIAGNOSIS — D509 Iron deficiency anemia, unspecified: Secondary | ICD-10-CM | POA: Diagnosis not present

## 2023-06-28 DIAGNOSIS — D631 Anemia in chronic kidney disease: Secondary | ICD-10-CM | POA: Diagnosis not present

## 2023-06-28 DIAGNOSIS — N2581 Secondary hyperparathyroidism of renal origin: Secondary | ICD-10-CM | POA: Diagnosis not present

## 2023-06-28 DIAGNOSIS — Z4932 Encounter for adequacy testing for peritoneal dialysis: Secondary | ICD-10-CM | POA: Diagnosis not present

## 2023-06-28 DIAGNOSIS — K769 Liver disease, unspecified: Secondary | ICD-10-CM | POA: Diagnosis not present

## 2023-06-28 DIAGNOSIS — R8290 Unspecified abnormal findings in urine: Secondary | ICD-10-CM | POA: Diagnosis not present

## 2023-06-28 DIAGNOSIS — Z992 Dependence on renal dialysis: Secondary | ICD-10-CM | POA: Diagnosis not present

## 2023-06-28 DIAGNOSIS — N186 End stage renal disease: Secondary | ICD-10-CM | POA: Diagnosis not present

## 2023-06-29 DIAGNOSIS — R8290 Unspecified abnormal findings in urine: Secondary | ICD-10-CM | POA: Diagnosis not present

## 2023-06-29 DIAGNOSIS — K769 Liver disease, unspecified: Secondary | ICD-10-CM | POA: Diagnosis not present

## 2023-06-29 DIAGNOSIS — N186 End stage renal disease: Secondary | ICD-10-CM | POA: Diagnosis not present

## 2023-06-29 DIAGNOSIS — Z4932 Encounter for adequacy testing for peritoneal dialysis: Secondary | ICD-10-CM | POA: Diagnosis not present

## 2023-06-29 DIAGNOSIS — D509 Iron deficiency anemia, unspecified: Secondary | ICD-10-CM | POA: Diagnosis not present

## 2023-06-29 DIAGNOSIS — N2581 Secondary hyperparathyroidism of renal origin: Secondary | ICD-10-CM | POA: Diagnosis not present

## 2023-06-29 DIAGNOSIS — Z992 Dependence on renal dialysis: Secondary | ICD-10-CM | POA: Diagnosis not present

## 2023-06-29 DIAGNOSIS — D631 Anemia in chronic kidney disease: Secondary | ICD-10-CM | POA: Diagnosis not present

## 2023-06-30 DIAGNOSIS — N2581 Secondary hyperparathyroidism of renal origin: Secondary | ICD-10-CM | POA: Diagnosis not present

## 2023-06-30 DIAGNOSIS — D509 Iron deficiency anemia, unspecified: Secondary | ICD-10-CM | POA: Diagnosis not present

## 2023-06-30 DIAGNOSIS — N186 End stage renal disease: Secondary | ICD-10-CM | POA: Diagnosis not present

## 2023-06-30 DIAGNOSIS — K769 Liver disease, unspecified: Secondary | ICD-10-CM | POA: Diagnosis not present

## 2023-06-30 DIAGNOSIS — D631 Anemia in chronic kidney disease: Secondary | ICD-10-CM | POA: Diagnosis not present

## 2023-06-30 DIAGNOSIS — R8290 Unspecified abnormal findings in urine: Secondary | ICD-10-CM | POA: Diagnosis not present

## 2023-06-30 DIAGNOSIS — Z4932 Encounter for adequacy testing for peritoneal dialysis: Secondary | ICD-10-CM | POA: Diagnosis not present

## 2023-06-30 DIAGNOSIS — Z992 Dependence on renal dialysis: Secondary | ICD-10-CM | POA: Diagnosis not present

## 2023-07-01 DIAGNOSIS — D509 Iron deficiency anemia, unspecified: Secondary | ICD-10-CM | POA: Diagnosis not present

## 2023-07-01 DIAGNOSIS — N186 End stage renal disease: Secondary | ICD-10-CM | POA: Diagnosis not present

## 2023-07-01 DIAGNOSIS — Z992 Dependence on renal dialysis: Secondary | ICD-10-CM | POA: Diagnosis not present

## 2023-07-01 DIAGNOSIS — N2581 Secondary hyperparathyroidism of renal origin: Secondary | ICD-10-CM | POA: Diagnosis not present

## 2023-07-01 DIAGNOSIS — K769 Liver disease, unspecified: Secondary | ICD-10-CM | POA: Diagnosis not present

## 2023-07-01 DIAGNOSIS — Z4932 Encounter for adequacy testing for peritoneal dialysis: Secondary | ICD-10-CM | POA: Diagnosis not present

## 2023-07-01 DIAGNOSIS — D631 Anemia in chronic kidney disease: Secondary | ICD-10-CM | POA: Diagnosis not present

## 2023-07-01 DIAGNOSIS — R8290 Unspecified abnormal findings in urine: Secondary | ICD-10-CM | POA: Diagnosis not present

## 2023-07-02 DIAGNOSIS — K769 Liver disease, unspecified: Secondary | ICD-10-CM | POA: Diagnosis not present

## 2023-07-02 DIAGNOSIS — N2581 Secondary hyperparathyroidism of renal origin: Secondary | ICD-10-CM | POA: Diagnosis not present

## 2023-07-02 DIAGNOSIS — Z992 Dependence on renal dialysis: Secondary | ICD-10-CM | POA: Diagnosis not present

## 2023-07-02 DIAGNOSIS — N186 End stage renal disease: Secondary | ICD-10-CM | POA: Diagnosis not present

## 2023-07-02 DIAGNOSIS — D509 Iron deficiency anemia, unspecified: Secondary | ICD-10-CM | POA: Diagnosis not present

## 2023-07-02 DIAGNOSIS — R8290 Unspecified abnormal findings in urine: Secondary | ICD-10-CM | POA: Diagnosis not present

## 2023-07-02 DIAGNOSIS — Z4932 Encounter for adequacy testing for peritoneal dialysis: Secondary | ICD-10-CM | POA: Diagnosis not present

## 2023-07-02 DIAGNOSIS — D631 Anemia in chronic kidney disease: Secondary | ICD-10-CM | POA: Diagnosis not present

## 2023-07-03 DIAGNOSIS — N2581 Secondary hyperparathyroidism of renal origin: Secondary | ICD-10-CM | POA: Diagnosis not present

## 2023-07-03 DIAGNOSIS — D509 Iron deficiency anemia, unspecified: Secondary | ICD-10-CM | POA: Diagnosis not present

## 2023-07-03 DIAGNOSIS — D631 Anemia in chronic kidney disease: Secondary | ICD-10-CM | POA: Diagnosis not present

## 2023-07-03 DIAGNOSIS — K769 Liver disease, unspecified: Secondary | ICD-10-CM | POA: Diagnosis not present

## 2023-07-03 DIAGNOSIS — N186 End stage renal disease: Secondary | ICD-10-CM | POA: Diagnosis not present

## 2023-07-03 DIAGNOSIS — Z4932 Encounter for adequacy testing for peritoneal dialysis: Secondary | ICD-10-CM | POA: Diagnosis not present

## 2023-07-03 DIAGNOSIS — Z992 Dependence on renal dialysis: Secondary | ICD-10-CM | POA: Diagnosis not present

## 2023-07-03 DIAGNOSIS — R8290 Unspecified abnormal findings in urine: Secondary | ICD-10-CM | POA: Diagnosis not present

## 2023-07-04 DIAGNOSIS — D509 Iron deficiency anemia, unspecified: Secondary | ICD-10-CM | POA: Diagnosis not present

## 2023-07-04 DIAGNOSIS — N186 End stage renal disease: Secondary | ICD-10-CM | POA: Diagnosis not present

## 2023-07-04 DIAGNOSIS — D631 Anemia in chronic kidney disease: Secondary | ICD-10-CM | POA: Diagnosis not present

## 2023-07-04 DIAGNOSIS — R8290 Unspecified abnormal findings in urine: Secondary | ICD-10-CM | POA: Diagnosis not present

## 2023-07-04 DIAGNOSIS — K769 Liver disease, unspecified: Secondary | ICD-10-CM | POA: Diagnosis not present

## 2023-07-04 DIAGNOSIS — N2581 Secondary hyperparathyroidism of renal origin: Secondary | ICD-10-CM | POA: Diagnosis not present

## 2023-07-04 DIAGNOSIS — Z992 Dependence on renal dialysis: Secondary | ICD-10-CM | POA: Diagnosis not present

## 2023-07-04 DIAGNOSIS — Z4932 Encounter for adequacy testing for peritoneal dialysis: Secondary | ICD-10-CM | POA: Diagnosis not present

## 2023-07-05 DIAGNOSIS — D509 Iron deficiency anemia, unspecified: Secondary | ICD-10-CM | POA: Diagnosis not present

## 2023-07-05 DIAGNOSIS — Z4932 Encounter for adequacy testing for peritoneal dialysis: Secondary | ICD-10-CM | POA: Diagnosis not present

## 2023-07-05 DIAGNOSIS — N186 End stage renal disease: Secondary | ICD-10-CM | POA: Diagnosis not present

## 2023-07-05 DIAGNOSIS — N2581 Secondary hyperparathyroidism of renal origin: Secondary | ICD-10-CM | POA: Diagnosis not present

## 2023-07-05 DIAGNOSIS — K769 Liver disease, unspecified: Secondary | ICD-10-CM | POA: Diagnosis not present

## 2023-07-05 DIAGNOSIS — R8290 Unspecified abnormal findings in urine: Secondary | ICD-10-CM | POA: Diagnosis not present

## 2023-07-05 DIAGNOSIS — Z992 Dependence on renal dialysis: Secondary | ICD-10-CM | POA: Diagnosis not present

## 2023-07-05 DIAGNOSIS — D631 Anemia in chronic kidney disease: Secondary | ICD-10-CM | POA: Diagnosis not present

## 2023-07-06 DIAGNOSIS — Z4932 Encounter for adequacy testing for peritoneal dialysis: Secondary | ICD-10-CM | POA: Diagnosis not present

## 2023-07-06 DIAGNOSIS — Z992 Dependence on renal dialysis: Secondary | ICD-10-CM | POA: Diagnosis not present

## 2023-07-06 DIAGNOSIS — K769 Liver disease, unspecified: Secondary | ICD-10-CM | POA: Diagnosis not present

## 2023-07-06 DIAGNOSIS — D631 Anemia in chronic kidney disease: Secondary | ICD-10-CM | POA: Diagnosis not present

## 2023-07-06 DIAGNOSIS — N2581 Secondary hyperparathyroidism of renal origin: Secondary | ICD-10-CM | POA: Diagnosis not present

## 2023-07-06 DIAGNOSIS — R8290 Unspecified abnormal findings in urine: Secondary | ICD-10-CM | POA: Diagnosis not present

## 2023-07-06 DIAGNOSIS — N186 End stage renal disease: Secondary | ICD-10-CM | POA: Diagnosis not present

## 2023-07-06 DIAGNOSIS — D509 Iron deficiency anemia, unspecified: Secondary | ICD-10-CM | POA: Diagnosis not present

## 2023-07-07 DIAGNOSIS — Z4932 Encounter for adequacy testing for peritoneal dialysis: Secondary | ICD-10-CM | POA: Diagnosis not present

## 2023-07-07 DIAGNOSIS — D631 Anemia in chronic kidney disease: Secondary | ICD-10-CM | POA: Diagnosis not present

## 2023-07-07 DIAGNOSIS — R8290 Unspecified abnormal findings in urine: Secondary | ICD-10-CM | POA: Diagnosis not present

## 2023-07-07 DIAGNOSIS — Z992 Dependence on renal dialysis: Secondary | ICD-10-CM | POA: Diagnosis not present

## 2023-07-07 DIAGNOSIS — D509 Iron deficiency anemia, unspecified: Secondary | ICD-10-CM | POA: Diagnosis not present

## 2023-07-07 DIAGNOSIS — N186 End stage renal disease: Secondary | ICD-10-CM | POA: Diagnosis not present

## 2023-07-07 DIAGNOSIS — K769 Liver disease, unspecified: Secondary | ICD-10-CM | POA: Diagnosis not present

## 2023-07-07 DIAGNOSIS — N2581 Secondary hyperparathyroidism of renal origin: Secondary | ICD-10-CM | POA: Diagnosis not present

## 2023-07-08 DIAGNOSIS — D631 Anemia in chronic kidney disease: Secondary | ICD-10-CM | POA: Diagnosis not present

## 2023-07-08 DIAGNOSIS — Z992 Dependence on renal dialysis: Secondary | ICD-10-CM | POA: Diagnosis not present

## 2023-07-08 DIAGNOSIS — D509 Iron deficiency anemia, unspecified: Secondary | ICD-10-CM | POA: Diagnosis not present

## 2023-07-08 DIAGNOSIS — N2581 Secondary hyperparathyroidism of renal origin: Secondary | ICD-10-CM | POA: Diagnosis not present

## 2023-07-08 DIAGNOSIS — R8290 Unspecified abnormal findings in urine: Secondary | ICD-10-CM | POA: Diagnosis not present

## 2023-07-08 DIAGNOSIS — K769 Liver disease, unspecified: Secondary | ICD-10-CM | POA: Diagnosis not present

## 2023-07-08 DIAGNOSIS — N186 End stage renal disease: Secondary | ICD-10-CM | POA: Diagnosis not present

## 2023-07-08 DIAGNOSIS — Z4932 Encounter for adequacy testing for peritoneal dialysis: Secondary | ICD-10-CM | POA: Diagnosis not present

## 2023-07-09 DIAGNOSIS — D509 Iron deficiency anemia, unspecified: Secondary | ICD-10-CM | POA: Diagnosis not present

## 2023-07-09 DIAGNOSIS — N186 End stage renal disease: Secondary | ICD-10-CM | POA: Diagnosis not present

## 2023-07-09 DIAGNOSIS — Z992 Dependence on renal dialysis: Secondary | ICD-10-CM | POA: Diagnosis not present

## 2023-07-09 DIAGNOSIS — R8290 Unspecified abnormal findings in urine: Secondary | ICD-10-CM | POA: Diagnosis not present

## 2023-07-09 DIAGNOSIS — Z4932 Encounter for adequacy testing for peritoneal dialysis: Secondary | ICD-10-CM | POA: Diagnosis not present

## 2023-07-09 DIAGNOSIS — K769 Liver disease, unspecified: Secondary | ICD-10-CM | POA: Diagnosis not present

## 2023-07-09 DIAGNOSIS — D631 Anemia in chronic kidney disease: Secondary | ICD-10-CM | POA: Diagnosis not present

## 2023-07-09 DIAGNOSIS — N2581 Secondary hyperparathyroidism of renal origin: Secondary | ICD-10-CM | POA: Diagnosis not present

## 2023-07-10 DIAGNOSIS — D509 Iron deficiency anemia, unspecified: Secondary | ICD-10-CM | POA: Diagnosis not present

## 2023-07-10 DIAGNOSIS — Z992 Dependence on renal dialysis: Secondary | ICD-10-CM | POA: Diagnosis not present

## 2023-07-10 DIAGNOSIS — D631 Anemia in chronic kidney disease: Secondary | ICD-10-CM | POA: Diagnosis not present

## 2023-07-10 DIAGNOSIS — R8290 Unspecified abnormal findings in urine: Secondary | ICD-10-CM | POA: Diagnosis not present

## 2023-07-10 DIAGNOSIS — N186 End stage renal disease: Secondary | ICD-10-CM | POA: Diagnosis not present

## 2023-07-10 DIAGNOSIS — N2581 Secondary hyperparathyroidism of renal origin: Secondary | ICD-10-CM | POA: Diagnosis not present

## 2023-07-10 DIAGNOSIS — K769 Liver disease, unspecified: Secondary | ICD-10-CM | POA: Diagnosis not present

## 2023-07-10 DIAGNOSIS — Z4932 Encounter for adequacy testing for peritoneal dialysis: Secondary | ICD-10-CM | POA: Diagnosis not present

## 2023-07-11 DIAGNOSIS — D509 Iron deficiency anemia, unspecified: Secondary | ICD-10-CM | POA: Diagnosis not present

## 2023-07-11 DIAGNOSIS — N186 End stage renal disease: Secondary | ICD-10-CM | POA: Diagnosis not present

## 2023-07-11 DIAGNOSIS — Z992 Dependence on renal dialysis: Secondary | ICD-10-CM | POA: Diagnosis not present

## 2023-07-11 DIAGNOSIS — D631 Anemia in chronic kidney disease: Secondary | ICD-10-CM | POA: Diagnosis not present

## 2023-07-11 DIAGNOSIS — K769 Liver disease, unspecified: Secondary | ICD-10-CM | POA: Diagnosis not present

## 2023-07-11 DIAGNOSIS — N2581 Secondary hyperparathyroidism of renal origin: Secondary | ICD-10-CM | POA: Diagnosis not present

## 2023-07-11 DIAGNOSIS — Z4932 Encounter for adequacy testing for peritoneal dialysis: Secondary | ICD-10-CM | POA: Diagnosis not present

## 2023-07-11 DIAGNOSIS — R8290 Unspecified abnormal findings in urine: Secondary | ICD-10-CM | POA: Diagnosis not present

## 2023-07-12 DIAGNOSIS — R8290 Unspecified abnormal findings in urine: Secondary | ICD-10-CM | POA: Diagnosis not present

## 2023-07-12 DIAGNOSIS — N186 End stage renal disease: Secondary | ICD-10-CM | POA: Diagnosis not present

## 2023-07-12 DIAGNOSIS — N2581 Secondary hyperparathyroidism of renal origin: Secondary | ICD-10-CM | POA: Diagnosis not present

## 2023-07-12 DIAGNOSIS — K769 Liver disease, unspecified: Secondary | ICD-10-CM | POA: Diagnosis not present

## 2023-07-12 DIAGNOSIS — D509 Iron deficiency anemia, unspecified: Secondary | ICD-10-CM | POA: Diagnosis not present

## 2023-07-12 DIAGNOSIS — D631 Anemia in chronic kidney disease: Secondary | ICD-10-CM | POA: Diagnosis not present

## 2023-07-12 DIAGNOSIS — Z4932 Encounter for adequacy testing for peritoneal dialysis: Secondary | ICD-10-CM | POA: Diagnosis not present

## 2023-07-12 DIAGNOSIS — Z992 Dependence on renal dialysis: Secondary | ICD-10-CM | POA: Diagnosis not present

## 2023-07-13 DIAGNOSIS — R8290 Unspecified abnormal findings in urine: Secondary | ICD-10-CM | POA: Diagnosis not present

## 2023-07-13 DIAGNOSIS — Z992 Dependence on renal dialysis: Secondary | ICD-10-CM | POA: Diagnosis not present

## 2023-07-13 DIAGNOSIS — K769 Liver disease, unspecified: Secondary | ICD-10-CM | POA: Diagnosis not present

## 2023-07-13 DIAGNOSIS — N186 End stage renal disease: Secondary | ICD-10-CM | POA: Diagnosis not present

## 2023-07-13 DIAGNOSIS — D509 Iron deficiency anemia, unspecified: Secondary | ICD-10-CM | POA: Diagnosis not present

## 2023-07-13 DIAGNOSIS — I129 Hypertensive chronic kidney disease with stage 1 through stage 4 chronic kidney disease, or unspecified chronic kidney disease: Secondary | ICD-10-CM | POA: Diagnosis not present

## 2023-07-13 DIAGNOSIS — N2581 Secondary hyperparathyroidism of renal origin: Secondary | ICD-10-CM | POA: Diagnosis not present

## 2023-07-13 DIAGNOSIS — Z4932 Encounter for adequacy testing for peritoneal dialysis: Secondary | ICD-10-CM | POA: Diagnosis not present

## 2023-07-13 DIAGNOSIS — D631 Anemia in chronic kidney disease: Secondary | ICD-10-CM | POA: Diagnosis not present

## 2023-07-14 DIAGNOSIS — N2581 Secondary hyperparathyroidism of renal origin: Secondary | ICD-10-CM | POA: Diagnosis not present

## 2023-07-14 DIAGNOSIS — D509 Iron deficiency anemia, unspecified: Secondary | ICD-10-CM | POA: Diagnosis not present

## 2023-07-14 DIAGNOSIS — R8290 Unspecified abnormal findings in urine: Secondary | ICD-10-CM | POA: Diagnosis not present

## 2023-07-14 DIAGNOSIS — Z992 Dependence on renal dialysis: Secondary | ICD-10-CM | POA: Diagnosis not present

## 2023-07-14 DIAGNOSIS — N186 End stage renal disease: Secondary | ICD-10-CM | POA: Diagnosis not present

## 2023-07-14 DIAGNOSIS — K769 Liver disease, unspecified: Secondary | ICD-10-CM | POA: Diagnosis not present

## 2023-07-14 DIAGNOSIS — Z4932 Encounter for adequacy testing for peritoneal dialysis: Secondary | ICD-10-CM | POA: Diagnosis not present

## 2023-07-14 DIAGNOSIS — D631 Anemia in chronic kidney disease: Secondary | ICD-10-CM | POA: Diagnosis not present

## 2023-07-15 DIAGNOSIS — N186 End stage renal disease: Secondary | ICD-10-CM | POA: Diagnosis not present

## 2023-07-15 DIAGNOSIS — D631 Anemia in chronic kidney disease: Secondary | ICD-10-CM | POA: Diagnosis not present

## 2023-07-15 DIAGNOSIS — K769 Liver disease, unspecified: Secondary | ICD-10-CM | POA: Diagnosis not present

## 2023-07-15 DIAGNOSIS — N2581 Secondary hyperparathyroidism of renal origin: Secondary | ICD-10-CM | POA: Diagnosis not present

## 2023-07-15 DIAGNOSIS — D509 Iron deficiency anemia, unspecified: Secondary | ICD-10-CM | POA: Diagnosis not present

## 2023-07-15 DIAGNOSIS — Z992 Dependence on renal dialysis: Secondary | ICD-10-CM | POA: Diagnosis not present

## 2023-07-15 DIAGNOSIS — R8290 Unspecified abnormal findings in urine: Secondary | ICD-10-CM | POA: Diagnosis not present

## 2023-07-15 DIAGNOSIS — Z4932 Encounter for adequacy testing for peritoneal dialysis: Secondary | ICD-10-CM | POA: Diagnosis not present

## 2023-07-16 DIAGNOSIS — N2581 Secondary hyperparathyroidism of renal origin: Secondary | ICD-10-CM | POA: Diagnosis not present

## 2023-07-16 DIAGNOSIS — N186 End stage renal disease: Secondary | ICD-10-CM | POA: Diagnosis not present

## 2023-07-16 DIAGNOSIS — R8290 Unspecified abnormal findings in urine: Secondary | ICD-10-CM | POA: Diagnosis not present

## 2023-07-16 DIAGNOSIS — D631 Anemia in chronic kidney disease: Secondary | ICD-10-CM | POA: Diagnosis not present

## 2023-07-16 DIAGNOSIS — Z4932 Encounter for adequacy testing for peritoneal dialysis: Secondary | ICD-10-CM | POA: Diagnosis not present

## 2023-07-16 DIAGNOSIS — D509 Iron deficiency anemia, unspecified: Secondary | ICD-10-CM | POA: Diagnosis not present

## 2023-07-16 DIAGNOSIS — Z992 Dependence on renal dialysis: Secondary | ICD-10-CM | POA: Diagnosis not present

## 2023-07-16 DIAGNOSIS — K769 Liver disease, unspecified: Secondary | ICD-10-CM | POA: Diagnosis not present

## 2023-07-17 DIAGNOSIS — R8290 Unspecified abnormal findings in urine: Secondary | ICD-10-CM | POA: Diagnosis not present

## 2023-07-17 DIAGNOSIS — Z4932 Encounter for adequacy testing for peritoneal dialysis: Secondary | ICD-10-CM | POA: Diagnosis not present

## 2023-07-17 DIAGNOSIS — D631 Anemia in chronic kidney disease: Secondary | ICD-10-CM | POA: Diagnosis not present

## 2023-07-17 DIAGNOSIS — N2581 Secondary hyperparathyroidism of renal origin: Secondary | ICD-10-CM | POA: Diagnosis not present

## 2023-07-17 DIAGNOSIS — N186 End stage renal disease: Secondary | ICD-10-CM | POA: Diagnosis not present

## 2023-07-17 DIAGNOSIS — D509 Iron deficiency anemia, unspecified: Secondary | ICD-10-CM | POA: Diagnosis not present

## 2023-07-17 DIAGNOSIS — Z992 Dependence on renal dialysis: Secondary | ICD-10-CM | POA: Diagnosis not present

## 2023-07-17 DIAGNOSIS — K769 Liver disease, unspecified: Secondary | ICD-10-CM | POA: Diagnosis not present

## 2023-07-18 DIAGNOSIS — K769 Liver disease, unspecified: Secondary | ICD-10-CM | POA: Diagnosis not present

## 2023-07-18 DIAGNOSIS — D509 Iron deficiency anemia, unspecified: Secondary | ICD-10-CM | POA: Diagnosis not present

## 2023-07-18 DIAGNOSIS — D631 Anemia in chronic kidney disease: Secondary | ICD-10-CM | POA: Diagnosis not present

## 2023-07-18 DIAGNOSIS — N2581 Secondary hyperparathyroidism of renal origin: Secondary | ICD-10-CM | POA: Diagnosis not present

## 2023-07-18 DIAGNOSIS — N186 End stage renal disease: Secondary | ICD-10-CM | POA: Diagnosis not present

## 2023-07-18 DIAGNOSIS — Z4932 Encounter for adequacy testing for peritoneal dialysis: Secondary | ICD-10-CM | POA: Diagnosis not present

## 2023-07-18 DIAGNOSIS — Z992 Dependence on renal dialysis: Secondary | ICD-10-CM | POA: Diagnosis not present

## 2023-07-18 DIAGNOSIS — R8290 Unspecified abnormal findings in urine: Secondary | ICD-10-CM | POA: Diagnosis not present

## 2023-07-19 DIAGNOSIS — K769 Liver disease, unspecified: Secondary | ICD-10-CM | POA: Diagnosis not present

## 2023-07-19 DIAGNOSIS — Z4932 Encounter for adequacy testing for peritoneal dialysis: Secondary | ICD-10-CM | POA: Diagnosis not present

## 2023-07-19 DIAGNOSIS — D631 Anemia in chronic kidney disease: Secondary | ICD-10-CM | POA: Diagnosis not present

## 2023-07-19 DIAGNOSIS — D509 Iron deficiency anemia, unspecified: Secondary | ICD-10-CM | POA: Diagnosis not present

## 2023-07-19 DIAGNOSIS — N2581 Secondary hyperparathyroidism of renal origin: Secondary | ICD-10-CM | POA: Diagnosis not present

## 2023-07-19 DIAGNOSIS — N186 End stage renal disease: Secondary | ICD-10-CM | POA: Diagnosis not present

## 2023-07-19 DIAGNOSIS — Z992 Dependence on renal dialysis: Secondary | ICD-10-CM | POA: Diagnosis not present

## 2023-07-19 DIAGNOSIS — R8290 Unspecified abnormal findings in urine: Secondary | ICD-10-CM | POA: Diagnosis not present

## 2023-07-20 DIAGNOSIS — K769 Liver disease, unspecified: Secondary | ICD-10-CM | POA: Diagnosis not present

## 2023-07-20 DIAGNOSIS — N2581 Secondary hyperparathyroidism of renal origin: Secondary | ICD-10-CM | POA: Diagnosis not present

## 2023-07-20 DIAGNOSIS — D631 Anemia in chronic kidney disease: Secondary | ICD-10-CM | POA: Diagnosis not present

## 2023-07-20 DIAGNOSIS — Z992 Dependence on renal dialysis: Secondary | ICD-10-CM | POA: Diagnosis not present

## 2023-07-20 DIAGNOSIS — Z4932 Encounter for adequacy testing for peritoneal dialysis: Secondary | ICD-10-CM | POA: Diagnosis not present

## 2023-07-20 DIAGNOSIS — R8290 Unspecified abnormal findings in urine: Secondary | ICD-10-CM | POA: Diagnosis not present

## 2023-07-20 DIAGNOSIS — N186 End stage renal disease: Secondary | ICD-10-CM | POA: Diagnosis not present

## 2023-07-20 DIAGNOSIS — D509 Iron deficiency anemia, unspecified: Secondary | ICD-10-CM | POA: Diagnosis not present

## 2023-07-21 DIAGNOSIS — N2581 Secondary hyperparathyroidism of renal origin: Secondary | ICD-10-CM | POA: Diagnosis not present

## 2023-07-21 DIAGNOSIS — Z992 Dependence on renal dialysis: Secondary | ICD-10-CM | POA: Diagnosis not present

## 2023-07-21 DIAGNOSIS — D631 Anemia in chronic kidney disease: Secondary | ICD-10-CM | POA: Diagnosis not present

## 2023-07-21 DIAGNOSIS — R8290 Unspecified abnormal findings in urine: Secondary | ICD-10-CM | POA: Diagnosis not present

## 2023-07-21 DIAGNOSIS — K769 Liver disease, unspecified: Secondary | ICD-10-CM | POA: Diagnosis not present

## 2023-07-21 DIAGNOSIS — Z4932 Encounter for adequacy testing for peritoneal dialysis: Secondary | ICD-10-CM | POA: Diagnosis not present

## 2023-07-21 DIAGNOSIS — N186 End stage renal disease: Secondary | ICD-10-CM | POA: Diagnosis not present

## 2023-07-21 DIAGNOSIS — D509 Iron deficiency anemia, unspecified: Secondary | ICD-10-CM | POA: Diagnosis not present

## 2023-07-22 DIAGNOSIS — N2581 Secondary hyperparathyroidism of renal origin: Secondary | ICD-10-CM | POA: Diagnosis not present

## 2023-07-22 DIAGNOSIS — D509 Iron deficiency anemia, unspecified: Secondary | ICD-10-CM | POA: Diagnosis not present

## 2023-07-22 DIAGNOSIS — K769 Liver disease, unspecified: Secondary | ICD-10-CM | POA: Diagnosis not present

## 2023-07-22 DIAGNOSIS — Z992 Dependence on renal dialysis: Secondary | ICD-10-CM | POA: Diagnosis not present

## 2023-07-22 DIAGNOSIS — D631 Anemia in chronic kidney disease: Secondary | ICD-10-CM | POA: Diagnosis not present

## 2023-07-22 DIAGNOSIS — R8290 Unspecified abnormal findings in urine: Secondary | ICD-10-CM | POA: Diagnosis not present

## 2023-07-22 DIAGNOSIS — N186 End stage renal disease: Secondary | ICD-10-CM | POA: Diagnosis not present

## 2023-07-22 DIAGNOSIS — Z4932 Encounter for adequacy testing for peritoneal dialysis: Secondary | ICD-10-CM | POA: Diagnosis not present

## 2023-07-23 DIAGNOSIS — Z4932 Encounter for adequacy testing for peritoneal dialysis: Secondary | ICD-10-CM | POA: Diagnosis not present

## 2023-07-23 DIAGNOSIS — Z992 Dependence on renal dialysis: Secondary | ICD-10-CM | POA: Diagnosis not present

## 2023-07-23 DIAGNOSIS — N2581 Secondary hyperparathyroidism of renal origin: Secondary | ICD-10-CM | POA: Diagnosis not present

## 2023-07-23 DIAGNOSIS — D509 Iron deficiency anemia, unspecified: Secondary | ICD-10-CM | POA: Diagnosis not present

## 2023-07-23 DIAGNOSIS — D631 Anemia in chronic kidney disease: Secondary | ICD-10-CM | POA: Diagnosis not present

## 2023-07-23 DIAGNOSIS — R8290 Unspecified abnormal findings in urine: Secondary | ICD-10-CM | POA: Diagnosis not present

## 2023-07-23 DIAGNOSIS — N186 End stage renal disease: Secondary | ICD-10-CM | POA: Diagnosis not present

## 2023-07-23 DIAGNOSIS — K769 Liver disease, unspecified: Secondary | ICD-10-CM | POA: Diagnosis not present

## 2023-07-24 DIAGNOSIS — R8290 Unspecified abnormal findings in urine: Secondary | ICD-10-CM | POA: Diagnosis not present

## 2023-07-24 DIAGNOSIS — N2581 Secondary hyperparathyroidism of renal origin: Secondary | ICD-10-CM | POA: Diagnosis not present

## 2023-07-24 DIAGNOSIS — D631 Anemia in chronic kidney disease: Secondary | ICD-10-CM | POA: Diagnosis not present

## 2023-07-24 DIAGNOSIS — K769 Liver disease, unspecified: Secondary | ICD-10-CM | POA: Diagnosis not present

## 2023-07-24 DIAGNOSIS — N186 End stage renal disease: Secondary | ICD-10-CM | POA: Diagnosis not present

## 2023-07-24 DIAGNOSIS — Z992 Dependence on renal dialysis: Secondary | ICD-10-CM | POA: Diagnosis not present

## 2023-07-24 DIAGNOSIS — Z4932 Encounter for adequacy testing for peritoneal dialysis: Secondary | ICD-10-CM | POA: Diagnosis not present

## 2023-07-24 DIAGNOSIS — D509 Iron deficiency anemia, unspecified: Secondary | ICD-10-CM | POA: Diagnosis not present

## 2023-07-25 DIAGNOSIS — Z992 Dependence on renal dialysis: Secondary | ICD-10-CM | POA: Diagnosis not present

## 2023-07-25 DIAGNOSIS — D509 Iron deficiency anemia, unspecified: Secondary | ICD-10-CM | POA: Diagnosis not present

## 2023-07-25 DIAGNOSIS — N186 End stage renal disease: Secondary | ICD-10-CM | POA: Diagnosis not present

## 2023-07-25 DIAGNOSIS — N2581 Secondary hyperparathyroidism of renal origin: Secondary | ICD-10-CM | POA: Diagnosis not present

## 2023-07-25 DIAGNOSIS — K769 Liver disease, unspecified: Secondary | ICD-10-CM | POA: Diagnosis not present

## 2023-07-25 DIAGNOSIS — R8290 Unspecified abnormal findings in urine: Secondary | ICD-10-CM | POA: Diagnosis not present

## 2023-07-25 DIAGNOSIS — D631 Anemia in chronic kidney disease: Secondary | ICD-10-CM | POA: Diagnosis not present

## 2023-07-25 DIAGNOSIS — Z4932 Encounter for adequacy testing for peritoneal dialysis: Secondary | ICD-10-CM | POA: Diagnosis not present

## 2023-07-26 DIAGNOSIS — Z4932 Encounter for adequacy testing for peritoneal dialysis: Secondary | ICD-10-CM | POA: Diagnosis not present

## 2023-07-26 DIAGNOSIS — K769 Liver disease, unspecified: Secondary | ICD-10-CM | POA: Diagnosis not present

## 2023-07-26 DIAGNOSIS — N186 End stage renal disease: Secondary | ICD-10-CM | POA: Diagnosis not present

## 2023-07-26 DIAGNOSIS — N2581 Secondary hyperparathyroidism of renal origin: Secondary | ICD-10-CM | POA: Diagnosis not present

## 2023-07-26 DIAGNOSIS — R8290 Unspecified abnormal findings in urine: Secondary | ICD-10-CM | POA: Diagnosis not present

## 2023-07-26 DIAGNOSIS — D509 Iron deficiency anemia, unspecified: Secondary | ICD-10-CM | POA: Diagnosis not present

## 2023-07-26 DIAGNOSIS — D631 Anemia in chronic kidney disease: Secondary | ICD-10-CM | POA: Diagnosis not present

## 2023-07-26 DIAGNOSIS — Z992 Dependence on renal dialysis: Secondary | ICD-10-CM | POA: Diagnosis not present

## 2023-07-27 DIAGNOSIS — D631 Anemia in chronic kidney disease: Secondary | ICD-10-CM | POA: Diagnosis not present

## 2023-07-27 DIAGNOSIS — D509 Iron deficiency anemia, unspecified: Secondary | ICD-10-CM | POA: Diagnosis not present

## 2023-07-27 DIAGNOSIS — N2581 Secondary hyperparathyroidism of renal origin: Secondary | ICD-10-CM | POA: Diagnosis not present

## 2023-07-27 DIAGNOSIS — N186 End stage renal disease: Secondary | ICD-10-CM | POA: Diagnosis not present

## 2023-07-27 DIAGNOSIS — R8290 Unspecified abnormal findings in urine: Secondary | ICD-10-CM | POA: Diagnosis not present

## 2023-07-27 DIAGNOSIS — Z992 Dependence on renal dialysis: Secondary | ICD-10-CM | POA: Diagnosis not present

## 2023-07-27 DIAGNOSIS — K769 Liver disease, unspecified: Secondary | ICD-10-CM | POA: Diagnosis not present

## 2023-07-27 DIAGNOSIS — Z4932 Encounter for adequacy testing for peritoneal dialysis: Secondary | ICD-10-CM | POA: Diagnosis not present

## 2023-07-28 DIAGNOSIS — N186 End stage renal disease: Secondary | ICD-10-CM | POA: Diagnosis not present

## 2023-07-28 DIAGNOSIS — R8290 Unspecified abnormal findings in urine: Secondary | ICD-10-CM | POA: Diagnosis not present

## 2023-07-28 DIAGNOSIS — D631 Anemia in chronic kidney disease: Secondary | ICD-10-CM | POA: Diagnosis not present

## 2023-07-28 DIAGNOSIS — K769 Liver disease, unspecified: Secondary | ICD-10-CM | POA: Diagnosis not present

## 2023-07-28 DIAGNOSIS — D509 Iron deficiency anemia, unspecified: Secondary | ICD-10-CM | POA: Diagnosis not present

## 2023-07-28 DIAGNOSIS — Z4932 Encounter for adequacy testing for peritoneal dialysis: Secondary | ICD-10-CM | POA: Diagnosis not present

## 2023-07-28 DIAGNOSIS — Z992 Dependence on renal dialysis: Secondary | ICD-10-CM | POA: Diagnosis not present

## 2023-07-28 DIAGNOSIS — N2581 Secondary hyperparathyroidism of renal origin: Secondary | ICD-10-CM | POA: Diagnosis not present

## 2023-07-30 DIAGNOSIS — R8290 Unspecified abnormal findings in urine: Secondary | ICD-10-CM | POA: Diagnosis not present

## 2023-07-30 DIAGNOSIS — D509 Iron deficiency anemia, unspecified: Secondary | ICD-10-CM | POA: Diagnosis not present

## 2023-07-30 DIAGNOSIS — N2581 Secondary hyperparathyroidism of renal origin: Secondary | ICD-10-CM | POA: Diagnosis not present

## 2023-07-30 DIAGNOSIS — K769 Liver disease, unspecified: Secondary | ICD-10-CM | POA: Diagnosis not present

## 2023-07-30 DIAGNOSIS — N186 End stage renal disease: Secondary | ICD-10-CM | POA: Diagnosis not present

## 2023-07-30 DIAGNOSIS — Z4932 Encounter for adequacy testing for peritoneal dialysis: Secondary | ICD-10-CM | POA: Diagnosis not present

## 2023-07-30 DIAGNOSIS — Z992 Dependence on renal dialysis: Secondary | ICD-10-CM | POA: Diagnosis not present

## 2023-07-30 DIAGNOSIS — D631 Anemia in chronic kidney disease: Secondary | ICD-10-CM | POA: Diagnosis not present

## 2023-07-31 DIAGNOSIS — D509 Iron deficiency anemia, unspecified: Secondary | ICD-10-CM | POA: Diagnosis not present

## 2023-07-31 DIAGNOSIS — R8290 Unspecified abnormal findings in urine: Secondary | ICD-10-CM | POA: Diagnosis not present

## 2023-07-31 DIAGNOSIS — K769 Liver disease, unspecified: Secondary | ICD-10-CM | POA: Diagnosis not present

## 2023-07-31 DIAGNOSIS — Z4932 Encounter for adequacy testing for peritoneal dialysis: Secondary | ICD-10-CM | POA: Diagnosis not present

## 2023-07-31 DIAGNOSIS — Z992 Dependence on renal dialysis: Secondary | ICD-10-CM | POA: Diagnosis not present

## 2023-07-31 DIAGNOSIS — N2581 Secondary hyperparathyroidism of renal origin: Secondary | ICD-10-CM | POA: Diagnosis not present

## 2023-07-31 DIAGNOSIS — N186 End stage renal disease: Secondary | ICD-10-CM | POA: Diagnosis not present

## 2023-07-31 DIAGNOSIS — D631 Anemia in chronic kidney disease: Secondary | ICD-10-CM | POA: Diagnosis not present

## 2023-08-01 DIAGNOSIS — K769 Liver disease, unspecified: Secondary | ICD-10-CM | POA: Diagnosis not present

## 2023-08-01 DIAGNOSIS — N186 End stage renal disease: Secondary | ICD-10-CM | POA: Diagnosis not present

## 2023-08-01 DIAGNOSIS — Z4932 Encounter for adequacy testing for peritoneal dialysis: Secondary | ICD-10-CM | POA: Diagnosis not present

## 2023-08-01 DIAGNOSIS — R8290 Unspecified abnormal findings in urine: Secondary | ICD-10-CM | POA: Diagnosis not present

## 2023-08-01 DIAGNOSIS — D509 Iron deficiency anemia, unspecified: Secondary | ICD-10-CM | POA: Diagnosis not present

## 2023-08-01 DIAGNOSIS — Z992 Dependence on renal dialysis: Secondary | ICD-10-CM | POA: Diagnosis not present

## 2023-08-01 DIAGNOSIS — N2581 Secondary hyperparathyroidism of renal origin: Secondary | ICD-10-CM | POA: Diagnosis not present

## 2023-08-01 DIAGNOSIS — D631 Anemia in chronic kidney disease: Secondary | ICD-10-CM | POA: Diagnosis not present

## 2023-08-02 DIAGNOSIS — R8290 Unspecified abnormal findings in urine: Secondary | ICD-10-CM | POA: Diagnosis not present

## 2023-08-02 DIAGNOSIS — Z4932 Encounter for adequacy testing for peritoneal dialysis: Secondary | ICD-10-CM | POA: Diagnosis not present

## 2023-08-02 DIAGNOSIS — K769 Liver disease, unspecified: Secondary | ICD-10-CM | POA: Diagnosis not present

## 2023-08-02 DIAGNOSIS — D631 Anemia in chronic kidney disease: Secondary | ICD-10-CM | POA: Diagnosis not present

## 2023-08-02 DIAGNOSIS — N186 End stage renal disease: Secondary | ICD-10-CM | POA: Diagnosis not present

## 2023-08-02 DIAGNOSIS — D509 Iron deficiency anemia, unspecified: Secondary | ICD-10-CM | POA: Diagnosis not present

## 2023-08-02 DIAGNOSIS — N2581 Secondary hyperparathyroidism of renal origin: Secondary | ICD-10-CM | POA: Diagnosis not present

## 2023-08-02 DIAGNOSIS — Z992 Dependence on renal dialysis: Secondary | ICD-10-CM | POA: Diagnosis not present

## 2023-08-03 DIAGNOSIS — D631 Anemia in chronic kidney disease: Secondary | ICD-10-CM | POA: Diagnosis not present

## 2023-08-03 DIAGNOSIS — N2581 Secondary hyperparathyroidism of renal origin: Secondary | ICD-10-CM | POA: Diagnosis not present

## 2023-08-03 DIAGNOSIS — K769 Liver disease, unspecified: Secondary | ICD-10-CM | POA: Diagnosis not present

## 2023-08-03 DIAGNOSIS — R8290 Unspecified abnormal findings in urine: Secondary | ICD-10-CM | POA: Diagnosis not present

## 2023-08-03 DIAGNOSIS — N186 End stage renal disease: Secondary | ICD-10-CM | POA: Diagnosis not present

## 2023-08-03 DIAGNOSIS — Z4932 Encounter for adequacy testing for peritoneal dialysis: Secondary | ICD-10-CM | POA: Diagnosis not present

## 2023-08-03 DIAGNOSIS — Z992 Dependence on renal dialysis: Secondary | ICD-10-CM | POA: Diagnosis not present

## 2023-08-03 DIAGNOSIS — D509 Iron deficiency anemia, unspecified: Secondary | ICD-10-CM | POA: Diagnosis not present

## 2023-08-04 DIAGNOSIS — N2581 Secondary hyperparathyroidism of renal origin: Secondary | ICD-10-CM | POA: Diagnosis not present

## 2023-08-04 DIAGNOSIS — R8290 Unspecified abnormal findings in urine: Secondary | ICD-10-CM | POA: Diagnosis not present

## 2023-08-04 DIAGNOSIS — N186 End stage renal disease: Secondary | ICD-10-CM | POA: Diagnosis not present

## 2023-08-04 DIAGNOSIS — D509 Iron deficiency anemia, unspecified: Secondary | ICD-10-CM | POA: Diagnosis not present

## 2023-08-04 DIAGNOSIS — Z992 Dependence on renal dialysis: Secondary | ICD-10-CM | POA: Diagnosis not present

## 2023-08-04 DIAGNOSIS — K769 Liver disease, unspecified: Secondary | ICD-10-CM | POA: Diagnosis not present

## 2023-08-04 DIAGNOSIS — Z4932 Encounter for adequacy testing for peritoneal dialysis: Secondary | ICD-10-CM | POA: Diagnosis not present

## 2023-08-04 DIAGNOSIS — D631 Anemia in chronic kidney disease: Secondary | ICD-10-CM | POA: Diagnosis not present

## 2023-08-05 DIAGNOSIS — Z4932 Encounter for adequacy testing for peritoneal dialysis: Secondary | ICD-10-CM | POA: Diagnosis not present

## 2023-08-05 DIAGNOSIS — Z992 Dependence on renal dialysis: Secondary | ICD-10-CM | POA: Diagnosis not present

## 2023-08-05 DIAGNOSIS — N2581 Secondary hyperparathyroidism of renal origin: Secondary | ICD-10-CM | POA: Diagnosis not present

## 2023-08-05 DIAGNOSIS — D631 Anemia in chronic kidney disease: Secondary | ICD-10-CM | POA: Diagnosis not present

## 2023-08-05 DIAGNOSIS — D509 Iron deficiency anemia, unspecified: Secondary | ICD-10-CM | POA: Diagnosis not present

## 2023-08-05 DIAGNOSIS — R8290 Unspecified abnormal findings in urine: Secondary | ICD-10-CM | POA: Diagnosis not present

## 2023-08-05 DIAGNOSIS — N186 End stage renal disease: Secondary | ICD-10-CM | POA: Diagnosis not present

## 2023-08-05 DIAGNOSIS — K769 Liver disease, unspecified: Secondary | ICD-10-CM | POA: Diagnosis not present

## 2023-08-06 DIAGNOSIS — R8290 Unspecified abnormal findings in urine: Secondary | ICD-10-CM | POA: Diagnosis not present

## 2023-08-06 DIAGNOSIS — D631 Anemia in chronic kidney disease: Secondary | ICD-10-CM | POA: Diagnosis not present

## 2023-08-06 DIAGNOSIS — N2581 Secondary hyperparathyroidism of renal origin: Secondary | ICD-10-CM | POA: Diagnosis not present

## 2023-08-06 DIAGNOSIS — D509 Iron deficiency anemia, unspecified: Secondary | ICD-10-CM | POA: Diagnosis not present

## 2023-08-06 DIAGNOSIS — K769 Liver disease, unspecified: Secondary | ICD-10-CM | POA: Diagnosis not present

## 2023-08-06 DIAGNOSIS — N186 End stage renal disease: Secondary | ICD-10-CM | POA: Diagnosis not present

## 2023-08-06 DIAGNOSIS — Z992 Dependence on renal dialysis: Secondary | ICD-10-CM | POA: Diagnosis not present

## 2023-08-06 DIAGNOSIS — Z4932 Encounter for adequacy testing for peritoneal dialysis: Secondary | ICD-10-CM | POA: Diagnosis not present

## 2023-08-07 DIAGNOSIS — N186 End stage renal disease: Secondary | ICD-10-CM | POA: Diagnosis not present

## 2023-08-07 DIAGNOSIS — D509 Iron deficiency anemia, unspecified: Secondary | ICD-10-CM | POA: Diagnosis not present

## 2023-08-07 DIAGNOSIS — D631 Anemia in chronic kidney disease: Secondary | ICD-10-CM | POA: Diagnosis not present

## 2023-08-07 DIAGNOSIS — K769 Liver disease, unspecified: Secondary | ICD-10-CM | POA: Diagnosis not present

## 2023-08-07 DIAGNOSIS — Z992 Dependence on renal dialysis: Secondary | ICD-10-CM | POA: Diagnosis not present

## 2023-08-07 DIAGNOSIS — N2581 Secondary hyperparathyroidism of renal origin: Secondary | ICD-10-CM | POA: Diagnosis not present

## 2023-08-07 DIAGNOSIS — R8290 Unspecified abnormal findings in urine: Secondary | ICD-10-CM | POA: Diagnosis not present

## 2023-08-07 DIAGNOSIS — Z4932 Encounter for adequacy testing for peritoneal dialysis: Secondary | ICD-10-CM | POA: Diagnosis not present

## 2023-08-08 DIAGNOSIS — R8290 Unspecified abnormal findings in urine: Secondary | ICD-10-CM | POA: Diagnosis not present

## 2023-08-08 DIAGNOSIS — D631 Anemia in chronic kidney disease: Secondary | ICD-10-CM | POA: Diagnosis not present

## 2023-08-08 DIAGNOSIS — Z4932 Encounter for adequacy testing for peritoneal dialysis: Secondary | ICD-10-CM | POA: Diagnosis not present

## 2023-08-08 DIAGNOSIS — D509 Iron deficiency anemia, unspecified: Secondary | ICD-10-CM | POA: Diagnosis not present

## 2023-08-08 DIAGNOSIS — N2581 Secondary hyperparathyroidism of renal origin: Secondary | ICD-10-CM | POA: Diagnosis not present

## 2023-08-08 DIAGNOSIS — Z992 Dependence on renal dialysis: Secondary | ICD-10-CM | POA: Diagnosis not present

## 2023-08-08 DIAGNOSIS — N186 End stage renal disease: Secondary | ICD-10-CM | POA: Diagnosis not present

## 2023-08-08 DIAGNOSIS — K769 Liver disease, unspecified: Secondary | ICD-10-CM | POA: Diagnosis not present

## 2023-08-09 DIAGNOSIS — D631 Anemia in chronic kidney disease: Secondary | ICD-10-CM | POA: Diagnosis not present

## 2023-08-09 DIAGNOSIS — Z4932 Encounter for adequacy testing for peritoneal dialysis: Secondary | ICD-10-CM | POA: Diagnosis not present

## 2023-08-09 DIAGNOSIS — D509 Iron deficiency anemia, unspecified: Secondary | ICD-10-CM | POA: Diagnosis not present

## 2023-08-09 DIAGNOSIS — R8290 Unspecified abnormal findings in urine: Secondary | ICD-10-CM | POA: Diagnosis not present

## 2023-08-09 DIAGNOSIS — K769 Liver disease, unspecified: Secondary | ICD-10-CM | POA: Diagnosis not present

## 2023-08-09 DIAGNOSIS — N186 End stage renal disease: Secondary | ICD-10-CM | POA: Diagnosis not present

## 2023-08-09 DIAGNOSIS — Z992 Dependence on renal dialysis: Secondary | ICD-10-CM | POA: Diagnosis not present

## 2023-08-09 DIAGNOSIS — N2581 Secondary hyperparathyroidism of renal origin: Secondary | ICD-10-CM | POA: Diagnosis not present

## 2023-08-10 DIAGNOSIS — D509 Iron deficiency anemia, unspecified: Secondary | ICD-10-CM | POA: Diagnosis not present

## 2023-08-10 DIAGNOSIS — Z992 Dependence on renal dialysis: Secondary | ICD-10-CM | POA: Diagnosis not present

## 2023-08-10 DIAGNOSIS — D631 Anemia in chronic kidney disease: Secondary | ICD-10-CM | POA: Diagnosis not present

## 2023-08-10 DIAGNOSIS — Z4932 Encounter for adequacy testing for peritoneal dialysis: Secondary | ICD-10-CM | POA: Diagnosis not present

## 2023-08-10 DIAGNOSIS — N186 End stage renal disease: Secondary | ICD-10-CM | POA: Diagnosis not present

## 2023-08-10 DIAGNOSIS — K769 Liver disease, unspecified: Secondary | ICD-10-CM | POA: Diagnosis not present

## 2023-08-10 DIAGNOSIS — R8290 Unspecified abnormal findings in urine: Secondary | ICD-10-CM | POA: Diagnosis not present

## 2023-08-10 DIAGNOSIS — N2581 Secondary hyperparathyroidism of renal origin: Secondary | ICD-10-CM | POA: Diagnosis not present

## 2023-08-11 DIAGNOSIS — N186 End stage renal disease: Secondary | ICD-10-CM | POA: Diagnosis not present

## 2023-08-11 DIAGNOSIS — D509 Iron deficiency anemia, unspecified: Secondary | ICD-10-CM | POA: Diagnosis not present

## 2023-08-11 DIAGNOSIS — Z992 Dependence on renal dialysis: Secondary | ICD-10-CM | POA: Diagnosis not present

## 2023-08-11 DIAGNOSIS — Z4932 Encounter for adequacy testing for peritoneal dialysis: Secondary | ICD-10-CM | POA: Diagnosis not present

## 2023-08-11 DIAGNOSIS — R8290 Unspecified abnormal findings in urine: Secondary | ICD-10-CM | POA: Diagnosis not present

## 2023-08-11 DIAGNOSIS — K769 Liver disease, unspecified: Secondary | ICD-10-CM | POA: Diagnosis not present

## 2023-08-11 DIAGNOSIS — D631 Anemia in chronic kidney disease: Secondary | ICD-10-CM | POA: Diagnosis not present

## 2023-08-11 DIAGNOSIS — N2581 Secondary hyperparathyroidism of renal origin: Secondary | ICD-10-CM | POA: Diagnosis not present

## 2023-08-12 DIAGNOSIS — R8290 Unspecified abnormal findings in urine: Secondary | ICD-10-CM | POA: Diagnosis not present

## 2023-08-12 DIAGNOSIS — Z4932 Encounter for adequacy testing for peritoneal dialysis: Secondary | ICD-10-CM | POA: Diagnosis not present

## 2023-08-12 DIAGNOSIS — I129 Hypertensive chronic kidney disease with stage 1 through stage 4 chronic kidney disease, or unspecified chronic kidney disease: Secondary | ICD-10-CM | POA: Diagnosis not present

## 2023-08-12 DIAGNOSIS — Z992 Dependence on renal dialysis: Secondary | ICD-10-CM | POA: Diagnosis not present

## 2023-08-12 DIAGNOSIS — K769 Liver disease, unspecified: Secondary | ICD-10-CM | POA: Diagnosis not present

## 2023-08-12 DIAGNOSIS — N186 End stage renal disease: Secondary | ICD-10-CM | POA: Diagnosis not present

## 2023-08-12 DIAGNOSIS — D509 Iron deficiency anemia, unspecified: Secondary | ICD-10-CM | POA: Diagnosis not present

## 2023-08-12 DIAGNOSIS — D631 Anemia in chronic kidney disease: Secondary | ICD-10-CM | POA: Diagnosis not present

## 2023-08-12 DIAGNOSIS — N2581 Secondary hyperparathyroidism of renal origin: Secondary | ICD-10-CM | POA: Diagnosis not present

## 2023-08-13 DIAGNOSIS — R8290 Unspecified abnormal findings in urine: Secondary | ICD-10-CM | POA: Diagnosis not present

## 2023-08-13 DIAGNOSIS — Z4932 Encounter for adequacy testing for peritoneal dialysis: Secondary | ICD-10-CM | POA: Diagnosis not present

## 2023-08-13 DIAGNOSIS — N2581 Secondary hyperparathyroidism of renal origin: Secondary | ICD-10-CM | POA: Diagnosis not present

## 2023-08-13 DIAGNOSIS — K769 Liver disease, unspecified: Secondary | ICD-10-CM | POA: Diagnosis not present

## 2023-08-13 DIAGNOSIS — Z992 Dependence on renal dialysis: Secondary | ICD-10-CM | POA: Diagnosis not present

## 2023-08-13 DIAGNOSIS — N186 End stage renal disease: Secondary | ICD-10-CM | POA: Diagnosis not present

## 2023-08-13 DIAGNOSIS — D631 Anemia in chronic kidney disease: Secondary | ICD-10-CM | POA: Diagnosis not present

## 2023-08-13 DIAGNOSIS — D509 Iron deficiency anemia, unspecified: Secondary | ICD-10-CM | POA: Diagnosis not present

## 2023-08-14 DIAGNOSIS — N186 End stage renal disease: Secondary | ICD-10-CM | POA: Diagnosis not present

## 2023-08-14 DIAGNOSIS — D509 Iron deficiency anemia, unspecified: Secondary | ICD-10-CM | POA: Diagnosis not present

## 2023-08-14 DIAGNOSIS — D631 Anemia in chronic kidney disease: Secondary | ICD-10-CM | POA: Diagnosis not present

## 2023-08-14 DIAGNOSIS — Z992 Dependence on renal dialysis: Secondary | ICD-10-CM | POA: Diagnosis not present

## 2023-08-14 DIAGNOSIS — N2581 Secondary hyperparathyroidism of renal origin: Secondary | ICD-10-CM | POA: Diagnosis not present

## 2023-08-14 DIAGNOSIS — Z4932 Encounter for adequacy testing for peritoneal dialysis: Secondary | ICD-10-CM | POA: Diagnosis not present

## 2023-08-14 DIAGNOSIS — R8290 Unspecified abnormal findings in urine: Secondary | ICD-10-CM | POA: Diagnosis not present

## 2023-08-14 DIAGNOSIS — K769 Liver disease, unspecified: Secondary | ICD-10-CM | POA: Diagnosis not present

## 2023-08-15 DIAGNOSIS — N2581 Secondary hyperparathyroidism of renal origin: Secondary | ICD-10-CM | POA: Diagnosis not present

## 2023-08-15 DIAGNOSIS — N186 End stage renal disease: Secondary | ICD-10-CM | POA: Diagnosis not present

## 2023-08-15 DIAGNOSIS — Z4932 Encounter for adequacy testing for peritoneal dialysis: Secondary | ICD-10-CM | POA: Diagnosis not present

## 2023-08-15 DIAGNOSIS — Z992 Dependence on renal dialysis: Secondary | ICD-10-CM | POA: Diagnosis not present

## 2023-08-15 DIAGNOSIS — K769 Liver disease, unspecified: Secondary | ICD-10-CM | POA: Diagnosis not present

## 2023-08-15 DIAGNOSIS — D509 Iron deficiency anemia, unspecified: Secondary | ICD-10-CM | POA: Diagnosis not present

## 2023-08-15 DIAGNOSIS — R8290 Unspecified abnormal findings in urine: Secondary | ICD-10-CM | POA: Diagnosis not present

## 2023-08-15 DIAGNOSIS — D631 Anemia in chronic kidney disease: Secondary | ICD-10-CM | POA: Diagnosis not present

## 2023-08-16 DIAGNOSIS — Z4932 Encounter for adequacy testing for peritoneal dialysis: Secondary | ICD-10-CM | POA: Diagnosis not present

## 2023-08-16 DIAGNOSIS — N2581 Secondary hyperparathyroidism of renal origin: Secondary | ICD-10-CM | POA: Diagnosis not present

## 2023-08-16 DIAGNOSIS — K769 Liver disease, unspecified: Secondary | ICD-10-CM | POA: Diagnosis not present

## 2023-08-16 DIAGNOSIS — N186 End stage renal disease: Secondary | ICD-10-CM | POA: Diagnosis not present

## 2023-08-16 DIAGNOSIS — R8290 Unspecified abnormal findings in urine: Secondary | ICD-10-CM | POA: Diagnosis not present

## 2023-08-16 DIAGNOSIS — Z992 Dependence on renal dialysis: Secondary | ICD-10-CM | POA: Diagnosis not present

## 2023-08-16 DIAGNOSIS — D631 Anemia in chronic kidney disease: Secondary | ICD-10-CM | POA: Diagnosis not present

## 2023-08-16 DIAGNOSIS — D509 Iron deficiency anemia, unspecified: Secondary | ICD-10-CM | POA: Diagnosis not present

## 2023-08-17 DIAGNOSIS — R8290 Unspecified abnormal findings in urine: Secondary | ICD-10-CM | POA: Diagnosis not present

## 2023-08-17 DIAGNOSIS — Z992 Dependence on renal dialysis: Secondary | ICD-10-CM | POA: Diagnosis not present

## 2023-08-17 DIAGNOSIS — Z4932 Encounter for adequacy testing for peritoneal dialysis: Secondary | ICD-10-CM | POA: Diagnosis not present

## 2023-08-17 DIAGNOSIS — N186 End stage renal disease: Secondary | ICD-10-CM | POA: Diagnosis not present

## 2023-08-17 DIAGNOSIS — N2581 Secondary hyperparathyroidism of renal origin: Secondary | ICD-10-CM | POA: Diagnosis not present

## 2023-08-17 DIAGNOSIS — K769 Liver disease, unspecified: Secondary | ICD-10-CM | POA: Diagnosis not present

## 2023-08-17 DIAGNOSIS — D509 Iron deficiency anemia, unspecified: Secondary | ICD-10-CM | POA: Diagnosis not present

## 2023-08-17 DIAGNOSIS — D631 Anemia in chronic kidney disease: Secondary | ICD-10-CM | POA: Diagnosis not present

## 2023-08-18 DIAGNOSIS — D631 Anemia in chronic kidney disease: Secondary | ICD-10-CM | POA: Diagnosis not present

## 2023-08-18 DIAGNOSIS — N186 End stage renal disease: Secondary | ICD-10-CM | POA: Diagnosis not present

## 2023-08-18 DIAGNOSIS — D509 Iron deficiency anemia, unspecified: Secondary | ICD-10-CM | POA: Diagnosis not present

## 2023-08-18 DIAGNOSIS — R8290 Unspecified abnormal findings in urine: Secondary | ICD-10-CM | POA: Diagnosis not present

## 2023-08-18 DIAGNOSIS — K769 Liver disease, unspecified: Secondary | ICD-10-CM | POA: Diagnosis not present

## 2023-08-18 DIAGNOSIS — Z992 Dependence on renal dialysis: Secondary | ICD-10-CM | POA: Diagnosis not present

## 2023-08-18 DIAGNOSIS — N2581 Secondary hyperparathyroidism of renal origin: Secondary | ICD-10-CM | POA: Diagnosis not present

## 2023-08-18 DIAGNOSIS — Z4932 Encounter for adequacy testing for peritoneal dialysis: Secondary | ICD-10-CM | POA: Diagnosis not present

## 2023-08-19 DIAGNOSIS — D631 Anemia in chronic kidney disease: Secondary | ICD-10-CM | POA: Diagnosis not present

## 2023-08-19 DIAGNOSIS — N186 End stage renal disease: Secondary | ICD-10-CM | POA: Diagnosis not present

## 2023-08-19 DIAGNOSIS — K769 Liver disease, unspecified: Secondary | ICD-10-CM | POA: Diagnosis not present

## 2023-08-19 DIAGNOSIS — R8290 Unspecified abnormal findings in urine: Secondary | ICD-10-CM | POA: Diagnosis not present

## 2023-08-19 DIAGNOSIS — Z992 Dependence on renal dialysis: Secondary | ICD-10-CM | POA: Diagnosis not present

## 2023-08-19 DIAGNOSIS — N2581 Secondary hyperparathyroidism of renal origin: Secondary | ICD-10-CM | POA: Diagnosis not present

## 2023-08-19 DIAGNOSIS — Z4932 Encounter for adequacy testing for peritoneal dialysis: Secondary | ICD-10-CM | POA: Diagnosis not present

## 2023-08-19 DIAGNOSIS — D509 Iron deficiency anemia, unspecified: Secondary | ICD-10-CM | POA: Diagnosis not present

## 2023-08-20 DIAGNOSIS — D509 Iron deficiency anemia, unspecified: Secondary | ICD-10-CM | POA: Diagnosis not present

## 2023-08-20 DIAGNOSIS — N2581 Secondary hyperparathyroidism of renal origin: Secondary | ICD-10-CM | POA: Diagnosis not present

## 2023-08-20 DIAGNOSIS — N186 End stage renal disease: Secondary | ICD-10-CM | POA: Diagnosis not present

## 2023-08-20 DIAGNOSIS — K769 Liver disease, unspecified: Secondary | ICD-10-CM | POA: Diagnosis not present

## 2023-08-20 DIAGNOSIS — R8290 Unspecified abnormal findings in urine: Secondary | ICD-10-CM | POA: Diagnosis not present

## 2023-08-20 DIAGNOSIS — Z992 Dependence on renal dialysis: Secondary | ICD-10-CM | POA: Diagnosis not present

## 2023-08-20 DIAGNOSIS — Z4932 Encounter for adequacy testing for peritoneal dialysis: Secondary | ICD-10-CM | POA: Diagnosis not present

## 2023-08-20 DIAGNOSIS — D631 Anemia in chronic kidney disease: Secondary | ICD-10-CM | POA: Diagnosis not present

## 2023-08-21 DIAGNOSIS — Z992 Dependence on renal dialysis: Secondary | ICD-10-CM | POA: Diagnosis not present

## 2023-08-21 DIAGNOSIS — R8290 Unspecified abnormal findings in urine: Secondary | ICD-10-CM | POA: Diagnosis not present

## 2023-08-21 DIAGNOSIS — D631 Anemia in chronic kidney disease: Secondary | ICD-10-CM | POA: Diagnosis not present

## 2023-08-21 DIAGNOSIS — N2581 Secondary hyperparathyroidism of renal origin: Secondary | ICD-10-CM | POA: Diagnosis not present

## 2023-08-21 DIAGNOSIS — N186 End stage renal disease: Secondary | ICD-10-CM | POA: Diagnosis not present

## 2023-08-21 DIAGNOSIS — Z4932 Encounter for adequacy testing for peritoneal dialysis: Secondary | ICD-10-CM | POA: Diagnosis not present

## 2023-08-21 DIAGNOSIS — D509 Iron deficiency anemia, unspecified: Secondary | ICD-10-CM | POA: Diagnosis not present

## 2023-08-21 DIAGNOSIS — K769 Liver disease, unspecified: Secondary | ICD-10-CM | POA: Diagnosis not present

## 2023-08-22 DIAGNOSIS — Z992 Dependence on renal dialysis: Secondary | ICD-10-CM | POA: Diagnosis not present

## 2023-08-22 DIAGNOSIS — D631 Anemia in chronic kidney disease: Secondary | ICD-10-CM | POA: Diagnosis not present

## 2023-08-22 DIAGNOSIS — N2581 Secondary hyperparathyroidism of renal origin: Secondary | ICD-10-CM | POA: Diagnosis not present

## 2023-08-22 DIAGNOSIS — R8290 Unspecified abnormal findings in urine: Secondary | ICD-10-CM | POA: Diagnosis not present

## 2023-08-22 DIAGNOSIS — K769 Liver disease, unspecified: Secondary | ICD-10-CM | POA: Diagnosis not present

## 2023-08-22 DIAGNOSIS — Z4932 Encounter for adequacy testing for peritoneal dialysis: Secondary | ICD-10-CM | POA: Diagnosis not present

## 2023-08-22 DIAGNOSIS — N186 End stage renal disease: Secondary | ICD-10-CM | POA: Diagnosis not present

## 2023-08-22 DIAGNOSIS — D509 Iron deficiency anemia, unspecified: Secondary | ICD-10-CM | POA: Diagnosis not present

## 2023-08-23 DIAGNOSIS — D509 Iron deficiency anemia, unspecified: Secondary | ICD-10-CM | POA: Diagnosis not present

## 2023-08-23 DIAGNOSIS — Z4932 Encounter for adequacy testing for peritoneal dialysis: Secondary | ICD-10-CM | POA: Diagnosis not present

## 2023-08-23 DIAGNOSIS — K769 Liver disease, unspecified: Secondary | ICD-10-CM | POA: Diagnosis not present

## 2023-08-23 DIAGNOSIS — R8290 Unspecified abnormal findings in urine: Secondary | ICD-10-CM | POA: Diagnosis not present

## 2023-08-23 DIAGNOSIS — D631 Anemia in chronic kidney disease: Secondary | ICD-10-CM | POA: Diagnosis not present

## 2023-08-23 DIAGNOSIS — Z992 Dependence on renal dialysis: Secondary | ICD-10-CM | POA: Diagnosis not present

## 2023-08-23 DIAGNOSIS — N186 End stage renal disease: Secondary | ICD-10-CM | POA: Diagnosis not present

## 2023-08-23 DIAGNOSIS — N2581 Secondary hyperparathyroidism of renal origin: Secondary | ICD-10-CM | POA: Diagnosis not present

## 2023-08-24 DIAGNOSIS — K769 Liver disease, unspecified: Secondary | ICD-10-CM | POA: Diagnosis not present

## 2023-08-24 DIAGNOSIS — D509 Iron deficiency anemia, unspecified: Secondary | ICD-10-CM | POA: Diagnosis not present

## 2023-08-24 DIAGNOSIS — R8290 Unspecified abnormal findings in urine: Secondary | ICD-10-CM | POA: Diagnosis not present

## 2023-08-24 DIAGNOSIS — Z992 Dependence on renal dialysis: Secondary | ICD-10-CM | POA: Diagnosis not present

## 2023-08-24 DIAGNOSIS — N186 End stage renal disease: Secondary | ICD-10-CM | POA: Diagnosis not present

## 2023-08-24 DIAGNOSIS — D631 Anemia in chronic kidney disease: Secondary | ICD-10-CM | POA: Diagnosis not present

## 2023-08-24 DIAGNOSIS — N2581 Secondary hyperparathyroidism of renal origin: Secondary | ICD-10-CM | POA: Diagnosis not present

## 2023-08-24 DIAGNOSIS — Z4932 Encounter for adequacy testing for peritoneal dialysis: Secondary | ICD-10-CM | POA: Diagnosis not present

## 2023-08-25 DIAGNOSIS — Z992 Dependence on renal dialysis: Secondary | ICD-10-CM | POA: Diagnosis not present

## 2023-08-25 DIAGNOSIS — R8290 Unspecified abnormal findings in urine: Secondary | ICD-10-CM | POA: Diagnosis not present

## 2023-08-25 DIAGNOSIS — N2581 Secondary hyperparathyroidism of renal origin: Secondary | ICD-10-CM | POA: Diagnosis not present

## 2023-08-25 DIAGNOSIS — K769 Liver disease, unspecified: Secondary | ICD-10-CM | POA: Diagnosis not present

## 2023-08-25 DIAGNOSIS — N186 End stage renal disease: Secondary | ICD-10-CM | POA: Diagnosis not present

## 2023-08-25 DIAGNOSIS — D509 Iron deficiency anemia, unspecified: Secondary | ICD-10-CM | POA: Diagnosis not present

## 2023-08-25 DIAGNOSIS — D631 Anemia in chronic kidney disease: Secondary | ICD-10-CM | POA: Diagnosis not present

## 2023-08-25 DIAGNOSIS — Z4932 Encounter for adequacy testing for peritoneal dialysis: Secondary | ICD-10-CM | POA: Diagnosis not present

## 2023-08-26 DIAGNOSIS — D631 Anemia in chronic kidney disease: Secondary | ICD-10-CM | POA: Diagnosis not present

## 2023-08-26 DIAGNOSIS — R8290 Unspecified abnormal findings in urine: Secondary | ICD-10-CM | POA: Diagnosis not present

## 2023-08-26 DIAGNOSIS — N2581 Secondary hyperparathyroidism of renal origin: Secondary | ICD-10-CM | POA: Diagnosis not present

## 2023-08-26 DIAGNOSIS — K769 Liver disease, unspecified: Secondary | ICD-10-CM | POA: Diagnosis not present

## 2023-08-26 DIAGNOSIS — D509 Iron deficiency anemia, unspecified: Secondary | ICD-10-CM | POA: Diagnosis not present

## 2023-08-26 DIAGNOSIS — Z4932 Encounter for adequacy testing for peritoneal dialysis: Secondary | ICD-10-CM | POA: Diagnosis not present

## 2023-08-26 DIAGNOSIS — Z992 Dependence on renal dialysis: Secondary | ICD-10-CM | POA: Diagnosis not present

## 2023-08-26 DIAGNOSIS — N186 End stage renal disease: Secondary | ICD-10-CM | POA: Diagnosis not present

## 2023-08-27 DIAGNOSIS — Z4932 Encounter for adequacy testing for peritoneal dialysis: Secondary | ICD-10-CM | POA: Diagnosis not present

## 2023-08-27 DIAGNOSIS — D631 Anemia in chronic kidney disease: Secondary | ICD-10-CM | POA: Diagnosis not present

## 2023-08-27 DIAGNOSIS — D509 Iron deficiency anemia, unspecified: Secondary | ICD-10-CM | POA: Diagnosis not present

## 2023-08-27 DIAGNOSIS — K769 Liver disease, unspecified: Secondary | ICD-10-CM | POA: Diagnosis not present

## 2023-08-27 DIAGNOSIS — N2581 Secondary hyperparathyroidism of renal origin: Secondary | ICD-10-CM | POA: Diagnosis not present

## 2023-08-27 DIAGNOSIS — Z992 Dependence on renal dialysis: Secondary | ICD-10-CM | POA: Diagnosis not present

## 2023-08-27 DIAGNOSIS — R8290 Unspecified abnormal findings in urine: Secondary | ICD-10-CM | POA: Diagnosis not present

## 2023-08-27 DIAGNOSIS — N186 End stage renal disease: Secondary | ICD-10-CM | POA: Diagnosis not present

## 2023-08-28 DIAGNOSIS — Z992 Dependence on renal dialysis: Secondary | ICD-10-CM | POA: Diagnosis not present

## 2023-08-28 DIAGNOSIS — R8290 Unspecified abnormal findings in urine: Secondary | ICD-10-CM | POA: Diagnosis not present

## 2023-08-28 DIAGNOSIS — D631 Anemia in chronic kidney disease: Secondary | ICD-10-CM | POA: Diagnosis not present

## 2023-08-28 DIAGNOSIS — Z4932 Encounter for adequacy testing for peritoneal dialysis: Secondary | ICD-10-CM | POA: Diagnosis not present

## 2023-08-28 DIAGNOSIS — D509 Iron deficiency anemia, unspecified: Secondary | ICD-10-CM | POA: Diagnosis not present

## 2023-08-28 DIAGNOSIS — K769 Liver disease, unspecified: Secondary | ICD-10-CM | POA: Diagnosis not present

## 2023-08-28 DIAGNOSIS — N2581 Secondary hyperparathyroidism of renal origin: Secondary | ICD-10-CM | POA: Diagnosis not present

## 2023-08-28 DIAGNOSIS — N186 End stage renal disease: Secondary | ICD-10-CM | POA: Diagnosis not present

## 2023-08-29 DIAGNOSIS — K769 Liver disease, unspecified: Secondary | ICD-10-CM | POA: Diagnosis not present

## 2023-08-29 DIAGNOSIS — N2581 Secondary hyperparathyroidism of renal origin: Secondary | ICD-10-CM | POA: Diagnosis not present

## 2023-08-29 DIAGNOSIS — Z4932 Encounter for adequacy testing for peritoneal dialysis: Secondary | ICD-10-CM | POA: Diagnosis not present

## 2023-08-29 DIAGNOSIS — R8290 Unspecified abnormal findings in urine: Secondary | ICD-10-CM | POA: Diagnosis not present

## 2023-08-29 DIAGNOSIS — D509 Iron deficiency anemia, unspecified: Secondary | ICD-10-CM | POA: Diagnosis not present

## 2023-08-29 DIAGNOSIS — D631 Anemia in chronic kidney disease: Secondary | ICD-10-CM | POA: Diagnosis not present

## 2023-08-29 DIAGNOSIS — Z992 Dependence on renal dialysis: Secondary | ICD-10-CM | POA: Diagnosis not present

## 2023-08-29 DIAGNOSIS — N186 End stage renal disease: Secondary | ICD-10-CM | POA: Diagnosis not present

## 2023-08-30 DIAGNOSIS — Z992 Dependence on renal dialysis: Secondary | ICD-10-CM | POA: Diagnosis not present

## 2023-08-30 DIAGNOSIS — N2581 Secondary hyperparathyroidism of renal origin: Secondary | ICD-10-CM | POA: Diagnosis not present

## 2023-08-30 DIAGNOSIS — R8290 Unspecified abnormal findings in urine: Secondary | ICD-10-CM | POA: Diagnosis not present

## 2023-08-30 DIAGNOSIS — D509 Iron deficiency anemia, unspecified: Secondary | ICD-10-CM | POA: Diagnosis not present

## 2023-08-30 DIAGNOSIS — Z4932 Encounter for adequacy testing for peritoneal dialysis: Secondary | ICD-10-CM | POA: Diagnosis not present

## 2023-08-30 DIAGNOSIS — N186 End stage renal disease: Secondary | ICD-10-CM | POA: Diagnosis not present

## 2023-08-30 DIAGNOSIS — D631 Anemia in chronic kidney disease: Secondary | ICD-10-CM | POA: Diagnosis not present

## 2023-08-30 DIAGNOSIS — K769 Liver disease, unspecified: Secondary | ICD-10-CM | POA: Diagnosis not present

## 2023-08-31 DIAGNOSIS — N2581 Secondary hyperparathyroidism of renal origin: Secondary | ICD-10-CM | POA: Diagnosis not present

## 2023-08-31 DIAGNOSIS — N186 End stage renal disease: Secondary | ICD-10-CM | POA: Diagnosis not present

## 2023-08-31 DIAGNOSIS — Z4932 Encounter for adequacy testing for peritoneal dialysis: Secondary | ICD-10-CM | POA: Diagnosis not present

## 2023-08-31 DIAGNOSIS — K769 Liver disease, unspecified: Secondary | ICD-10-CM | POA: Diagnosis not present

## 2023-08-31 DIAGNOSIS — Z992 Dependence on renal dialysis: Secondary | ICD-10-CM | POA: Diagnosis not present

## 2023-08-31 DIAGNOSIS — D631 Anemia in chronic kidney disease: Secondary | ICD-10-CM | POA: Diagnosis not present

## 2023-08-31 DIAGNOSIS — R8290 Unspecified abnormal findings in urine: Secondary | ICD-10-CM | POA: Diagnosis not present

## 2023-08-31 DIAGNOSIS — D509 Iron deficiency anemia, unspecified: Secondary | ICD-10-CM | POA: Diagnosis not present

## 2023-09-01 DIAGNOSIS — K769 Liver disease, unspecified: Secondary | ICD-10-CM | POA: Diagnosis not present

## 2023-09-01 DIAGNOSIS — Z4932 Encounter for adequacy testing for peritoneal dialysis: Secondary | ICD-10-CM | POA: Diagnosis not present

## 2023-09-01 DIAGNOSIS — D509 Iron deficiency anemia, unspecified: Secondary | ICD-10-CM | POA: Diagnosis not present

## 2023-09-01 DIAGNOSIS — D631 Anemia in chronic kidney disease: Secondary | ICD-10-CM | POA: Diagnosis not present

## 2023-09-01 DIAGNOSIS — R8290 Unspecified abnormal findings in urine: Secondary | ICD-10-CM | POA: Diagnosis not present

## 2023-09-01 DIAGNOSIS — N2581 Secondary hyperparathyroidism of renal origin: Secondary | ICD-10-CM | POA: Diagnosis not present

## 2023-09-01 DIAGNOSIS — Z992 Dependence on renal dialysis: Secondary | ICD-10-CM | POA: Diagnosis not present

## 2023-09-01 DIAGNOSIS — N186 End stage renal disease: Secondary | ICD-10-CM | POA: Diagnosis not present

## 2023-09-02 DIAGNOSIS — D509 Iron deficiency anemia, unspecified: Secondary | ICD-10-CM | POA: Diagnosis not present

## 2023-09-02 DIAGNOSIS — R8290 Unspecified abnormal findings in urine: Secondary | ICD-10-CM | POA: Diagnosis not present

## 2023-09-02 DIAGNOSIS — Z992 Dependence on renal dialysis: Secondary | ICD-10-CM | POA: Diagnosis not present

## 2023-09-02 DIAGNOSIS — D631 Anemia in chronic kidney disease: Secondary | ICD-10-CM | POA: Diagnosis not present

## 2023-09-02 DIAGNOSIS — K769 Liver disease, unspecified: Secondary | ICD-10-CM | POA: Diagnosis not present

## 2023-09-02 DIAGNOSIS — N186 End stage renal disease: Secondary | ICD-10-CM | POA: Diagnosis not present

## 2023-09-02 DIAGNOSIS — Z4932 Encounter for adequacy testing for peritoneal dialysis: Secondary | ICD-10-CM | POA: Diagnosis not present

## 2023-09-02 DIAGNOSIS — N2581 Secondary hyperparathyroidism of renal origin: Secondary | ICD-10-CM | POA: Diagnosis not present

## 2023-09-03 DIAGNOSIS — K769 Liver disease, unspecified: Secondary | ICD-10-CM | POA: Diagnosis not present

## 2023-09-03 DIAGNOSIS — D631 Anemia in chronic kidney disease: Secondary | ICD-10-CM | POA: Diagnosis not present

## 2023-09-03 DIAGNOSIS — D509 Iron deficiency anemia, unspecified: Secondary | ICD-10-CM | POA: Diagnosis not present

## 2023-09-03 DIAGNOSIS — N2581 Secondary hyperparathyroidism of renal origin: Secondary | ICD-10-CM | POA: Diagnosis not present

## 2023-09-03 DIAGNOSIS — R8290 Unspecified abnormal findings in urine: Secondary | ICD-10-CM | POA: Diagnosis not present

## 2023-09-03 DIAGNOSIS — Z992 Dependence on renal dialysis: Secondary | ICD-10-CM | POA: Diagnosis not present

## 2023-09-03 DIAGNOSIS — N186 End stage renal disease: Secondary | ICD-10-CM | POA: Diagnosis not present

## 2023-09-03 DIAGNOSIS — Z4932 Encounter for adequacy testing for peritoneal dialysis: Secondary | ICD-10-CM | POA: Diagnosis not present

## 2023-09-04 DIAGNOSIS — N186 End stage renal disease: Secondary | ICD-10-CM | POA: Diagnosis not present

## 2023-09-04 DIAGNOSIS — D509 Iron deficiency anemia, unspecified: Secondary | ICD-10-CM | POA: Diagnosis not present

## 2023-09-04 DIAGNOSIS — Z4932 Encounter for adequacy testing for peritoneal dialysis: Secondary | ICD-10-CM | POA: Diagnosis not present

## 2023-09-04 DIAGNOSIS — K769 Liver disease, unspecified: Secondary | ICD-10-CM | POA: Diagnosis not present

## 2023-09-04 DIAGNOSIS — R8290 Unspecified abnormal findings in urine: Secondary | ICD-10-CM | POA: Diagnosis not present

## 2023-09-04 DIAGNOSIS — N2581 Secondary hyperparathyroidism of renal origin: Secondary | ICD-10-CM | POA: Diagnosis not present

## 2023-09-04 DIAGNOSIS — D631 Anemia in chronic kidney disease: Secondary | ICD-10-CM | POA: Diagnosis not present

## 2023-09-04 DIAGNOSIS — Z992 Dependence on renal dialysis: Secondary | ICD-10-CM | POA: Diagnosis not present

## 2023-09-05 DIAGNOSIS — R8290 Unspecified abnormal findings in urine: Secondary | ICD-10-CM | POA: Diagnosis not present

## 2023-09-05 DIAGNOSIS — N2581 Secondary hyperparathyroidism of renal origin: Secondary | ICD-10-CM | POA: Diagnosis not present

## 2023-09-05 DIAGNOSIS — Z4932 Encounter for adequacy testing for peritoneal dialysis: Secondary | ICD-10-CM | POA: Diagnosis not present

## 2023-09-05 DIAGNOSIS — N186 End stage renal disease: Secondary | ICD-10-CM | POA: Diagnosis not present

## 2023-09-05 DIAGNOSIS — D509 Iron deficiency anemia, unspecified: Secondary | ICD-10-CM | POA: Diagnosis not present

## 2023-09-05 DIAGNOSIS — Z992 Dependence on renal dialysis: Secondary | ICD-10-CM | POA: Diagnosis not present

## 2023-09-05 DIAGNOSIS — D631 Anemia in chronic kidney disease: Secondary | ICD-10-CM | POA: Diagnosis not present

## 2023-09-05 DIAGNOSIS — K769 Liver disease, unspecified: Secondary | ICD-10-CM | POA: Diagnosis not present

## 2023-09-06 DIAGNOSIS — K769 Liver disease, unspecified: Secondary | ICD-10-CM | POA: Diagnosis not present

## 2023-09-06 DIAGNOSIS — Z992 Dependence on renal dialysis: Secondary | ICD-10-CM | POA: Diagnosis not present

## 2023-09-06 DIAGNOSIS — R8290 Unspecified abnormal findings in urine: Secondary | ICD-10-CM | POA: Diagnosis not present

## 2023-09-06 DIAGNOSIS — Z4932 Encounter for adequacy testing for peritoneal dialysis: Secondary | ICD-10-CM | POA: Diagnosis not present

## 2023-09-06 DIAGNOSIS — D509 Iron deficiency anemia, unspecified: Secondary | ICD-10-CM | POA: Diagnosis not present

## 2023-09-06 DIAGNOSIS — N2581 Secondary hyperparathyroidism of renal origin: Secondary | ICD-10-CM | POA: Diagnosis not present

## 2023-09-06 DIAGNOSIS — N186 End stage renal disease: Secondary | ICD-10-CM | POA: Diagnosis not present

## 2023-09-06 DIAGNOSIS — D631 Anemia in chronic kidney disease: Secondary | ICD-10-CM | POA: Diagnosis not present

## 2023-09-07 DIAGNOSIS — Z4932 Encounter for adequacy testing for peritoneal dialysis: Secondary | ICD-10-CM | POA: Diagnosis not present

## 2023-09-07 DIAGNOSIS — D509 Iron deficiency anemia, unspecified: Secondary | ICD-10-CM | POA: Diagnosis not present

## 2023-09-07 DIAGNOSIS — D631 Anemia in chronic kidney disease: Secondary | ICD-10-CM | POA: Diagnosis not present

## 2023-09-07 DIAGNOSIS — R8290 Unspecified abnormal findings in urine: Secondary | ICD-10-CM | POA: Diagnosis not present

## 2023-09-07 DIAGNOSIS — N186 End stage renal disease: Secondary | ICD-10-CM | POA: Diagnosis not present

## 2023-09-07 DIAGNOSIS — K769 Liver disease, unspecified: Secondary | ICD-10-CM | POA: Diagnosis not present

## 2023-09-07 DIAGNOSIS — Z992 Dependence on renal dialysis: Secondary | ICD-10-CM | POA: Diagnosis not present

## 2023-09-07 DIAGNOSIS — N2581 Secondary hyperparathyroidism of renal origin: Secondary | ICD-10-CM | POA: Diagnosis not present

## 2023-09-08 DIAGNOSIS — N186 End stage renal disease: Secondary | ICD-10-CM | POA: Diagnosis not present

## 2023-09-08 DIAGNOSIS — R8290 Unspecified abnormal findings in urine: Secondary | ICD-10-CM | POA: Diagnosis not present

## 2023-09-08 DIAGNOSIS — Z4932 Encounter for adequacy testing for peritoneal dialysis: Secondary | ICD-10-CM | POA: Diagnosis not present

## 2023-09-08 DIAGNOSIS — K769 Liver disease, unspecified: Secondary | ICD-10-CM | POA: Diagnosis not present

## 2023-09-08 DIAGNOSIS — D631 Anemia in chronic kidney disease: Secondary | ICD-10-CM | POA: Diagnosis not present

## 2023-09-08 DIAGNOSIS — D509 Iron deficiency anemia, unspecified: Secondary | ICD-10-CM | POA: Diagnosis not present

## 2023-09-08 DIAGNOSIS — N2581 Secondary hyperparathyroidism of renal origin: Secondary | ICD-10-CM | POA: Diagnosis not present

## 2023-09-08 DIAGNOSIS — Z992 Dependence on renal dialysis: Secondary | ICD-10-CM | POA: Diagnosis not present

## 2023-09-09 DIAGNOSIS — N2581 Secondary hyperparathyroidism of renal origin: Secondary | ICD-10-CM | POA: Diagnosis not present

## 2023-09-09 DIAGNOSIS — Z992 Dependence on renal dialysis: Secondary | ICD-10-CM | POA: Diagnosis not present

## 2023-09-09 DIAGNOSIS — D509 Iron deficiency anemia, unspecified: Secondary | ICD-10-CM | POA: Diagnosis not present

## 2023-09-09 DIAGNOSIS — K769 Liver disease, unspecified: Secondary | ICD-10-CM | POA: Diagnosis not present

## 2023-09-09 DIAGNOSIS — Z4932 Encounter for adequacy testing for peritoneal dialysis: Secondary | ICD-10-CM | POA: Diagnosis not present

## 2023-09-09 DIAGNOSIS — R8290 Unspecified abnormal findings in urine: Secondary | ICD-10-CM | POA: Diagnosis not present

## 2023-09-09 DIAGNOSIS — D631 Anemia in chronic kidney disease: Secondary | ICD-10-CM | POA: Diagnosis not present

## 2023-09-09 DIAGNOSIS — N186 End stage renal disease: Secondary | ICD-10-CM | POA: Diagnosis not present

## 2023-09-10 DIAGNOSIS — R8290 Unspecified abnormal findings in urine: Secondary | ICD-10-CM | POA: Diagnosis not present

## 2023-09-10 DIAGNOSIS — K769 Liver disease, unspecified: Secondary | ICD-10-CM | POA: Diagnosis not present

## 2023-09-10 DIAGNOSIS — N2581 Secondary hyperparathyroidism of renal origin: Secondary | ICD-10-CM | POA: Diagnosis not present

## 2023-09-10 DIAGNOSIS — Z4932 Encounter for adequacy testing for peritoneal dialysis: Secondary | ICD-10-CM | POA: Diagnosis not present

## 2023-09-10 DIAGNOSIS — N186 End stage renal disease: Secondary | ICD-10-CM | POA: Diagnosis not present

## 2023-09-10 DIAGNOSIS — Z992 Dependence on renal dialysis: Secondary | ICD-10-CM | POA: Diagnosis not present

## 2023-09-10 DIAGNOSIS — D509 Iron deficiency anemia, unspecified: Secondary | ICD-10-CM | POA: Diagnosis not present

## 2023-09-10 DIAGNOSIS — D631 Anemia in chronic kidney disease: Secondary | ICD-10-CM | POA: Diagnosis not present

## 2023-09-11 DIAGNOSIS — Z4932 Encounter for adequacy testing for peritoneal dialysis: Secondary | ICD-10-CM | POA: Diagnosis not present

## 2023-09-11 DIAGNOSIS — Z992 Dependence on renal dialysis: Secondary | ICD-10-CM | POA: Diagnosis not present

## 2023-09-11 DIAGNOSIS — D509 Iron deficiency anemia, unspecified: Secondary | ICD-10-CM | POA: Diagnosis not present

## 2023-09-11 DIAGNOSIS — D631 Anemia in chronic kidney disease: Secondary | ICD-10-CM | POA: Diagnosis not present

## 2023-09-11 DIAGNOSIS — K769 Liver disease, unspecified: Secondary | ICD-10-CM | POA: Diagnosis not present

## 2023-09-11 DIAGNOSIS — N186 End stage renal disease: Secondary | ICD-10-CM | POA: Diagnosis not present

## 2023-09-11 DIAGNOSIS — R8290 Unspecified abnormal findings in urine: Secondary | ICD-10-CM | POA: Diagnosis not present

## 2023-09-11 DIAGNOSIS — N2581 Secondary hyperparathyroidism of renal origin: Secondary | ICD-10-CM | POA: Diagnosis not present

## 2023-09-12 DIAGNOSIS — D631 Anemia in chronic kidney disease: Secondary | ICD-10-CM | POA: Diagnosis not present

## 2023-09-12 DIAGNOSIS — N2581 Secondary hyperparathyroidism of renal origin: Secondary | ICD-10-CM | POA: Diagnosis not present

## 2023-09-12 DIAGNOSIS — K769 Liver disease, unspecified: Secondary | ICD-10-CM | POA: Diagnosis not present

## 2023-09-12 DIAGNOSIS — N186 End stage renal disease: Secondary | ICD-10-CM | POA: Diagnosis not present

## 2023-09-12 DIAGNOSIS — R8290 Unspecified abnormal findings in urine: Secondary | ICD-10-CM | POA: Diagnosis not present

## 2023-09-12 DIAGNOSIS — Z992 Dependence on renal dialysis: Secondary | ICD-10-CM | POA: Diagnosis not present

## 2023-09-12 DIAGNOSIS — Z4932 Encounter for adequacy testing for peritoneal dialysis: Secondary | ICD-10-CM | POA: Diagnosis not present

## 2023-09-12 DIAGNOSIS — D509 Iron deficiency anemia, unspecified: Secondary | ICD-10-CM | POA: Diagnosis not present

## 2023-09-13 DIAGNOSIS — Z4932 Encounter for adequacy testing for peritoneal dialysis: Secondary | ICD-10-CM | POA: Diagnosis not present

## 2023-09-13 DIAGNOSIS — Z992 Dependence on renal dialysis: Secondary | ICD-10-CM | POA: Diagnosis not present

## 2023-09-13 DIAGNOSIS — N186 End stage renal disease: Secondary | ICD-10-CM | POA: Diagnosis not present

## 2023-09-13 DIAGNOSIS — N2581 Secondary hyperparathyroidism of renal origin: Secondary | ICD-10-CM | POA: Diagnosis not present

## 2023-09-13 DIAGNOSIS — K769 Liver disease, unspecified: Secondary | ICD-10-CM | POA: Diagnosis not present

## 2023-09-13 DIAGNOSIS — D509 Iron deficiency anemia, unspecified: Secondary | ICD-10-CM | POA: Diagnosis not present

## 2023-09-13 DIAGNOSIS — R8290 Unspecified abnormal findings in urine: Secondary | ICD-10-CM | POA: Diagnosis not present

## 2023-09-13 DIAGNOSIS — D631 Anemia in chronic kidney disease: Secondary | ICD-10-CM | POA: Diagnosis not present

## 2023-09-14 DIAGNOSIS — Z992 Dependence on renal dialysis: Secondary | ICD-10-CM | POA: Diagnosis not present

## 2023-09-14 DIAGNOSIS — N186 End stage renal disease: Secondary | ICD-10-CM | POA: Diagnosis not present

## 2023-09-14 DIAGNOSIS — D509 Iron deficiency anemia, unspecified: Secondary | ICD-10-CM | POA: Diagnosis not present

## 2023-09-14 DIAGNOSIS — R8290 Unspecified abnormal findings in urine: Secondary | ICD-10-CM | POA: Diagnosis not present

## 2023-09-14 DIAGNOSIS — K769 Liver disease, unspecified: Secondary | ICD-10-CM | POA: Diagnosis not present

## 2023-09-14 DIAGNOSIS — D631 Anemia in chronic kidney disease: Secondary | ICD-10-CM | POA: Diagnosis not present

## 2023-09-14 DIAGNOSIS — N2581 Secondary hyperparathyroidism of renal origin: Secondary | ICD-10-CM | POA: Diagnosis not present

## 2023-09-14 DIAGNOSIS — Z4932 Encounter for adequacy testing for peritoneal dialysis: Secondary | ICD-10-CM | POA: Diagnosis not present

## 2023-09-15 DIAGNOSIS — D509 Iron deficiency anemia, unspecified: Secondary | ICD-10-CM | POA: Diagnosis not present

## 2023-09-15 DIAGNOSIS — Z992 Dependence on renal dialysis: Secondary | ICD-10-CM | POA: Diagnosis not present

## 2023-09-15 DIAGNOSIS — Z4932 Encounter for adequacy testing for peritoneal dialysis: Secondary | ICD-10-CM | POA: Diagnosis not present

## 2023-09-15 DIAGNOSIS — N2581 Secondary hyperparathyroidism of renal origin: Secondary | ICD-10-CM | POA: Diagnosis not present

## 2023-09-15 DIAGNOSIS — K769 Liver disease, unspecified: Secondary | ICD-10-CM | POA: Diagnosis not present

## 2023-09-15 DIAGNOSIS — N186 End stage renal disease: Secondary | ICD-10-CM | POA: Diagnosis not present

## 2023-09-15 DIAGNOSIS — D631 Anemia in chronic kidney disease: Secondary | ICD-10-CM | POA: Diagnosis not present

## 2023-09-15 DIAGNOSIS — R8290 Unspecified abnormal findings in urine: Secondary | ICD-10-CM | POA: Diagnosis not present

## 2023-09-16 DIAGNOSIS — N186 End stage renal disease: Secondary | ICD-10-CM | POA: Diagnosis not present

## 2023-09-16 DIAGNOSIS — K769 Liver disease, unspecified: Secondary | ICD-10-CM | POA: Diagnosis not present

## 2023-09-16 DIAGNOSIS — Z992 Dependence on renal dialysis: Secondary | ICD-10-CM | POA: Diagnosis not present

## 2023-09-16 DIAGNOSIS — D631 Anemia in chronic kidney disease: Secondary | ICD-10-CM | POA: Diagnosis not present

## 2023-09-16 DIAGNOSIS — Z4932 Encounter for adequacy testing for peritoneal dialysis: Secondary | ICD-10-CM | POA: Diagnosis not present

## 2023-09-16 DIAGNOSIS — D509 Iron deficiency anemia, unspecified: Secondary | ICD-10-CM | POA: Diagnosis not present

## 2023-09-16 DIAGNOSIS — N2581 Secondary hyperparathyroidism of renal origin: Secondary | ICD-10-CM | POA: Diagnosis not present

## 2023-09-16 DIAGNOSIS — R8290 Unspecified abnormal findings in urine: Secondary | ICD-10-CM | POA: Diagnosis not present

## 2023-09-17 DIAGNOSIS — D631 Anemia in chronic kidney disease: Secondary | ICD-10-CM | POA: Diagnosis not present

## 2023-09-17 DIAGNOSIS — N2581 Secondary hyperparathyroidism of renal origin: Secondary | ICD-10-CM | POA: Diagnosis not present

## 2023-09-17 DIAGNOSIS — R8290 Unspecified abnormal findings in urine: Secondary | ICD-10-CM | POA: Diagnosis not present

## 2023-09-17 DIAGNOSIS — K769 Liver disease, unspecified: Secondary | ICD-10-CM | POA: Diagnosis not present

## 2023-09-17 DIAGNOSIS — Z4932 Encounter for adequacy testing for peritoneal dialysis: Secondary | ICD-10-CM | POA: Diagnosis not present

## 2023-09-17 DIAGNOSIS — N186 End stage renal disease: Secondary | ICD-10-CM | POA: Diagnosis not present

## 2023-09-17 DIAGNOSIS — Z992 Dependence on renal dialysis: Secondary | ICD-10-CM | POA: Diagnosis not present

## 2023-09-17 DIAGNOSIS — D509 Iron deficiency anemia, unspecified: Secondary | ICD-10-CM | POA: Diagnosis not present

## 2023-09-18 DIAGNOSIS — Z4932 Encounter for adequacy testing for peritoneal dialysis: Secondary | ICD-10-CM | POA: Diagnosis not present

## 2023-09-18 DIAGNOSIS — N186 End stage renal disease: Secondary | ICD-10-CM | POA: Diagnosis not present

## 2023-09-18 DIAGNOSIS — D631 Anemia in chronic kidney disease: Secondary | ICD-10-CM | POA: Diagnosis not present

## 2023-09-18 DIAGNOSIS — D509 Iron deficiency anemia, unspecified: Secondary | ICD-10-CM | POA: Diagnosis not present

## 2023-09-18 DIAGNOSIS — N2581 Secondary hyperparathyroidism of renal origin: Secondary | ICD-10-CM | POA: Diagnosis not present

## 2023-09-18 DIAGNOSIS — R8290 Unspecified abnormal findings in urine: Secondary | ICD-10-CM | POA: Diagnosis not present

## 2023-09-18 DIAGNOSIS — K769 Liver disease, unspecified: Secondary | ICD-10-CM | POA: Diagnosis not present

## 2023-09-18 DIAGNOSIS — Z992 Dependence on renal dialysis: Secondary | ICD-10-CM | POA: Diagnosis not present

## 2023-09-19 DIAGNOSIS — D509 Iron deficiency anemia, unspecified: Secondary | ICD-10-CM | POA: Diagnosis not present

## 2023-09-19 DIAGNOSIS — D631 Anemia in chronic kidney disease: Secondary | ICD-10-CM | POA: Diagnosis not present

## 2023-09-19 DIAGNOSIS — K769 Liver disease, unspecified: Secondary | ICD-10-CM | POA: Diagnosis not present

## 2023-09-19 DIAGNOSIS — R8290 Unspecified abnormal findings in urine: Secondary | ICD-10-CM | POA: Diagnosis not present

## 2023-09-19 DIAGNOSIS — N186 End stage renal disease: Secondary | ICD-10-CM | POA: Diagnosis not present

## 2023-09-19 DIAGNOSIS — Z4932 Encounter for adequacy testing for peritoneal dialysis: Secondary | ICD-10-CM | POA: Diagnosis not present

## 2023-09-19 DIAGNOSIS — N2581 Secondary hyperparathyroidism of renal origin: Secondary | ICD-10-CM | POA: Diagnosis not present

## 2023-09-19 DIAGNOSIS — Z992 Dependence on renal dialysis: Secondary | ICD-10-CM | POA: Diagnosis not present

## 2023-09-20 DIAGNOSIS — D509 Iron deficiency anemia, unspecified: Secondary | ICD-10-CM | POA: Diagnosis not present

## 2023-09-20 DIAGNOSIS — N2581 Secondary hyperparathyroidism of renal origin: Secondary | ICD-10-CM | POA: Diagnosis not present

## 2023-09-20 DIAGNOSIS — R8290 Unspecified abnormal findings in urine: Secondary | ICD-10-CM | POA: Diagnosis not present

## 2023-09-20 DIAGNOSIS — K769 Liver disease, unspecified: Secondary | ICD-10-CM | POA: Diagnosis not present

## 2023-09-20 DIAGNOSIS — N186 End stage renal disease: Secondary | ICD-10-CM | POA: Diagnosis not present

## 2023-09-20 DIAGNOSIS — Z4932 Encounter for adequacy testing for peritoneal dialysis: Secondary | ICD-10-CM | POA: Diagnosis not present

## 2023-09-20 DIAGNOSIS — D631 Anemia in chronic kidney disease: Secondary | ICD-10-CM | POA: Diagnosis not present

## 2023-09-20 DIAGNOSIS — Z992 Dependence on renal dialysis: Secondary | ICD-10-CM | POA: Diagnosis not present

## 2023-09-21 DIAGNOSIS — Z4932 Encounter for adequacy testing for peritoneal dialysis: Secondary | ICD-10-CM | POA: Diagnosis not present

## 2023-09-21 DIAGNOSIS — D631 Anemia in chronic kidney disease: Secondary | ICD-10-CM | POA: Diagnosis not present

## 2023-09-21 DIAGNOSIS — D509 Iron deficiency anemia, unspecified: Secondary | ICD-10-CM | POA: Diagnosis not present

## 2023-09-21 DIAGNOSIS — Z992 Dependence on renal dialysis: Secondary | ICD-10-CM | POA: Diagnosis not present

## 2023-09-21 DIAGNOSIS — K769 Liver disease, unspecified: Secondary | ICD-10-CM | POA: Diagnosis not present

## 2023-09-21 DIAGNOSIS — N186 End stage renal disease: Secondary | ICD-10-CM | POA: Diagnosis not present

## 2023-09-21 DIAGNOSIS — N2581 Secondary hyperparathyroidism of renal origin: Secondary | ICD-10-CM | POA: Diagnosis not present

## 2023-09-21 DIAGNOSIS — R8290 Unspecified abnormal findings in urine: Secondary | ICD-10-CM | POA: Diagnosis not present

## 2023-09-22 DIAGNOSIS — K769 Liver disease, unspecified: Secondary | ICD-10-CM | POA: Diagnosis not present

## 2023-09-22 DIAGNOSIS — Z992 Dependence on renal dialysis: Secondary | ICD-10-CM | POA: Diagnosis not present

## 2023-09-22 DIAGNOSIS — Z4932 Encounter for adequacy testing for peritoneal dialysis: Secondary | ICD-10-CM | POA: Diagnosis not present

## 2023-09-22 DIAGNOSIS — D631 Anemia in chronic kidney disease: Secondary | ICD-10-CM | POA: Diagnosis not present

## 2023-09-22 DIAGNOSIS — N2581 Secondary hyperparathyroidism of renal origin: Secondary | ICD-10-CM | POA: Diagnosis not present

## 2023-09-22 DIAGNOSIS — N186 End stage renal disease: Secondary | ICD-10-CM | POA: Diagnosis not present

## 2023-09-22 DIAGNOSIS — R8290 Unspecified abnormal findings in urine: Secondary | ICD-10-CM | POA: Diagnosis not present

## 2023-09-22 DIAGNOSIS — D509 Iron deficiency anemia, unspecified: Secondary | ICD-10-CM | POA: Diagnosis not present

## 2023-09-23 DIAGNOSIS — Z4932 Encounter for adequacy testing for peritoneal dialysis: Secondary | ICD-10-CM | POA: Diagnosis not present

## 2023-09-23 DIAGNOSIS — N2581 Secondary hyperparathyroidism of renal origin: Secondary | ICD-10-CM | POA: Diagnosis not present

## 2023-09-23 DIAGNOSIS — D509 Iron deficiency anemia, unspecified: Secondary | ICD-10-CM | POA: Diagnosis not present

## 2023-09-23 DIAGNOSIS — K769 Liver disease, unspecified: Secondary | ICD-10-CM | POA: Diagnosis not present

## 2023-09-23 DIAGNOSIS — D631 Anemia in chronic kidney disease: Secondary | ICD-10-CM | POA: Diagnosis not present

## 2023-09-23 DIAGNOSIS — R8290 Unspecified abnormal findings in urine: Secondary | ICD-10-CM | POA: Diagnosis not present

## 2023-09-23 DIAGNOSIS — N186 End stage renal disease: Secondary | ICD-10-CM | POA: Diagnosis not present

## 2023-09-23 DIAGNOSIS — Z992 Dependence on renal dialysis: Secondary | ICD-10-CM | POA: Diagnosis not present

## 2023-09-24 DIAGNOSIS — D631 Anemia in chronic kidney disease: Secondary | ICD-10-CM | POA: Diagnosis not present

## 2023-09-24 DIAGNOSIS — Z992 Dependence on renal dialysis: Secondary | ICD-10-CM | POA: Diagnosis not present

## 2023-09-24 DIAGNOSIS — Z4932 Encounter for adequacy testing for peritoneal dialysis: Secondary | ICD-10-CM | POA: Diagnosis not present

## 2023-09-24 DIAGNOSIS — R8290 Unspecified abnormal findings in urine: Secondary | ICD-10-CM | POA: Diagnosis not present

## 2023-09-24 DIAGNOSIS — N186 End stage renal disease: Secondary | ICD-10-CM | POA: Diagnosis not present

## 2023-09-24 DIAGNOSIS — D509 Iron deficiency anemia, unspecified: Secondary | ICD-10-CM | POA: Diagnosis not present

## 2023-09-24 DIAGNOSIS — K769 Liver disease, unspecified: Secondary | ICD-10-CM | POA: Diagnosis not present

## 2023-09-24 DIAGNOSIS — N2581 Secondary hyperparathyroidism of renal origin: Secondary | ICD-10-CM | POA: Diagnosis not present

## 2023-09-25 DIAGNOSIS — D631 Anemia in chronic kidney disease: Secondary | ICD-10-CM | POA: Diagnosis not present

## 2023-09-25 DIAGNOSIS — Z4932 Encounter for adequacy testing for peritoneal dialysis: Secondary | ICD-10-CM | POA: Diagnosis not present

## 2023-09-25 DIAGNOSIS — Z992 Dependence on renal dialysis: Secondary | ICD-10-CM | POA: Diagnosis not present

## 2023-09-25 DIAGNOSIS — N186 End stage renal disease: Secondary | ICD-10-CM | POA: Diagnosis not present

## 2023-09-25 DIAGNOSIS — R8290 Unspecified abnormal findings in urine: Secondary | ICD-10-CM | POA: Diagnosis not present

## 2023-09-25 DIAGNOSIS — N2581 Secondary hyperparathyroidism of renal origin: Secondary | ICD-10-CM | POA: Diagnosis not present

## 2023-09-25 DIAGNOSIS — D509 Iron deficiency anemia, unspecified: Secondary | ICD-10-CM | POA: Diagnosis not present

## 2023-09-25 DIAGNOSIS — K769 Liver disease, unspecified: Secondary | ICD-10-CM | POA: Diagnosis not present

## 2023-09-26 DIAGNOSIS — N2581 Secondary hyperparathyroidism of renal origin: Secondary | ICD-10-CM | POA: Diagnosis not present

## 2023-09-26 DIAGNOSIS — Z4932 Encounter for adequacy testing for peritoneal dialysis: Secondary | ICD-10-CM | POA: Diagnosis not present

## 2023-09-26 DIAGNOSIS — R8290 Unspecified abnormal findings in urine: Secondary | ICD-10-CM | POA: Diagnosis not present

## 2023-09-26 DIAGNOSIS — N186 End stage renal disease: Secondary | ICD-10-CM | POA: Diagnosis not present

## 2023-09-26 DIAGNOSIS — K769 Liver disease, unspecified: Secondary | ICD-10-CM | POA: Diagnosis not present

## 2023-09-26 DIAGNOSIS — D509 Iron deficiency anemia, unspecified: Secondary | ICD-10-CM | POA: Diagnosis not present

## 2023-09-26 DIAGNOSIS — D631 Anemia in chronic kidney disease: Secondary | ICD-10-CM | POA: Diagnosis not present

## 2023-09-26 DIAGNOSIS — Z992 Dependence on renal dialysis: Secondary | ICD-10-CM | POA: Diagnosis not present

## 2023-09-27 DIAGNOSIS — D509 Iron deficiency anemia, unspecified: Secondary | ICD-10-CM | POA: Diagnosis not present

## 2023-09-27 DIAGNOSIS — N186 End stage renal disease: Secondary | ICD-10-CM | POA: Diagnosis not present

## 2023-09-27 DIAGNOSIS — R8290 Unspecified abnormal findings in urine: Secondary | ICD-10-CM | POA: Diagnosis not present

## 2023-09-27 DIAGNOSIS — D631 Anemia in chronic kidney disease: Secondary | ICD-10-CM | POA: Diagnosis not present

## 2023-09-27 DIAGNOSIS — Z992 Dependence on renal dialysis: Secondary | ICD-10-CM | POA: Diagnosis not present

## 2023-09-27 DIAGNOSIS — N2581 Secondary hyperparathyroidism of renal origin: Secondary | ICD-10-CM | POA: Diagnosis not present

## 2023-09-27 DIAGNOSIS — Z4932 Encounter for adequacy testing for peritoneal dialysis: Secondary | ICD-10-CM | POA: Diagnosis not present

## 2023-09-27 DIAGNOSIS — K769 Liver disease, unspecified: Secondary | ICD-10-CM | POA: Diagnosis not present

## 2023-09-28 DIAGNOSIS — R8290 Unspecified abnormal findings in urine: Secondary | ICD-10-CM | POA: Diagnosis not present

## 2023-09-28 DIAGNOSIS — D509 Iron deficiency anemia, unspecified: Secondary | ICD-10-CM | POA: Diagnosis not present

## 2023-09-28 DIAGNOSIS — N186 End stage renal disease: Secondary | ICD-10-CM | POA: Diagnosis not present

## 2023-09-28 DIAGNOSIS — Z4932 Encounter for adequacy testing for peritoneal dialysis: Secondary | ICD-10-CM | POA: Diagnosis not present

## 2023-09-28 DIAGNOSIS — N2581 Secondary hyperparathyroidism of renal origin: Secondary | ICD-10-CM | POA: Diagnosis not present

## 2023-09-28 DIAGNOSIS — Z992 Dependence on renal dialysis: Secondary | ICD-10-CM | POA: Diagnosis not present

## 2023-09-28 DIAGNOSIS — K769 Liver disease, unspecified: Secondary | ICD-10-CM | POA: Diagnosis not present

## 2023-09-28 DIAGNOSIS — D631 Anemia in chronic kidney disease: Secondary | ICD-10-CM | POA: Diagnosis not present

## 2023-09-29 DIAGNOSIS — K769 Liver disease, unspecified: Secondary | ICD-10-CM | POA: Diagnosis not present

## 2023-09-29 DIAGNOSIS — Z992 Dependence on renal dialysis: Secondary | ICD-10-CM | POA: Diagnosis not present

## 2023-09-29 DIAGNOSIS — D631 Anemia in chronic kidney disease: Secondary | ICD-10-CM | POA: Diagnosis not present

## 2023-09-29 DIAGNOSIS — R8290 Unspecified abnormal findings in urine: Secondary | ICD-10-CM | POA: Diagnosis not present

## 2023-09-29 DIAGNOSIS — D509 Iron deficiency anemia, unspecified: Secondary | ICD-10-CM | POA: Diagnosis not present

## 2023-09-29 DIAGNOSIS — N186 End stage renal disease: Secondary | ICD-10-CM | POA: Diagnosis not present

## 2023-09-29 DIAGNOSIS — Z4932 Encounter for adequacy testing for peritoneal dialysis: Secondary | ICD-10-CM | POA: Diagnosis not present

## 2023-09-29 DIAGNOSIS — N2581 Secondary hyperparathyroidism of renal origin: Secondary | ICD-10-CM | POA: Diagnosis not present

## 2023-09-30 DIAGNOSIS — N186 End stage renal disease: Secondary | ICD-10-CM | POA: Diagnosis not present

## 2023-09-30 DIAGNOSIS — D509 Iron deficiency anemia, unspecified: Secondary | ICD-10-CM | POA: Diagnosis not present

## 2023-09-30 DIAGNOSIS — R8290 Unspecified abnormal findings in urine: Secondary | ICD-10-CM | POA: Diagnosis not present

## 2023-09-30 DIAGNOSIS — Z4932 Encounter for adequacy testing for peritoneal dialysis: Secondary | ICD-10-CM | POA: Diagnosis not present

## 2023-09-30 DIAGNOSIS — K769 Liver disease, unspecified: Secondary | ICD-10-CM | POA: Diagnosis not present

## 2023-09-30 DIAGNOSIS — Z992 Dependence on renal dialysis: Secondary | ICD-10-CM | POA: Diagnosis not present

## 2023-09-30 DIAGNOSIS — D631 Anemia in chronic kidney disease: Secondary | ICD-10-CM | POA: Diagnosis not present

## 2023-09-30 DIAGNOSIS — N2581 Secondary hyperparathyroidism of renal origin: Secondary | ICD-10-CM | POA: Diagnosis not present

## 2023-10-01 DIAGNOSIS — Z992 Dependence on renal dialysis: Secondary | ICD-10-CM | POA: Diagnosis not present

## 2023-10-01 DIAGNOSIS — D509 Iron deficiency anemia, unspecified: Secondary | ICD-10-CM | POA: Diagnosis not present

## 2023-10-01 DIAGNOSIS — D631 Anemia in chronic kidney disease: Secondary | ICD-10-CM | POA: Diagnosis not present

## 2023-10-01 DIAGNOSIS — N2581 Secondary hyperparathyroidism of renal origin: Secondary | ICD-10-CM | POA: Diagnosis not present

## 2023-10-01 DIAGNOSIS — N186 End stage renal disease: Secondary | ICD-10-CM | POA: Diagnosis not present

## 2023-10-01 DIAGNOSIS — Z4932 Encounter for adequacy testing for peritoneal dialysis: Secondary | ICD-10-CM | POA: Diagnosis not present

## 2023-10-01 DIAGNOSIS — R8290 Unspecified abnormal findings in urine: Secondary | ICD-10-CM | POA: Diagnosis not present

## 2023-10-01 DIAGNOSIS — K769 Liver disease, unspecified: Secondary | ICD-10-CM | POA: Diagnosis not present

## 2023-10-02 DIAGNOSIS — D631 Anemia in chronic kidney disease: Secondary | ICD-10-CM | POA: Diagnosis not present

## 2023-10-02 DIAGNOSIS — Z4932 Encounter for adequacy testing for peritoneal dialysis: Secondary | ICD-10-CM | POA: Diagnosis not present

## 2023-10-02 DIAGNOSIS — Z992 Dependence on renal dialysis: Secondary | ICD-10-CM | POA: Diagnosis not present

## 2023-10-02 DIAGNOSIS — N2581 Secondary hyperparathyroidism of renal origin: Secondary | ICD-10-CM | POA: Diagnosis not present

## 2023-10-02 DIAGNOSIS — D509 Iron deficiency anemia, unspecified: Secondary | ICD-10-CM | POA: Diagnosis not present

## 2023-10-02 DIAGNOSIS — N186 End stage renal disease: Secondary | ICD-10-CM | POA: Diagnosis not present

## 2023-10-02 DIAGNOSIS — K769 Liver disease, unspecified: Secondary | ICD-10-CM | POA: Diagnosis not present

## 2023-10-02 DIAGNOSIS — R8290 Unspecified abnormal findings in urine: Secondary | ICD-10-CM | POA: Diagnosis not present

## 2023-10-03 DIAGNOSIS — Z4932 Encounter for adequacy testing for peritoneal dialysis: Secondary | ICD-10-CM | POA: Diagnosis not present

## 2023-10-03 DIAGNOSIS — Z992 Dependence on renal dialysis: Secondary | ICD-10-CM | POA: Diagnosis not present

## 2023-10-03 DIAGNOSIS — N186 End stage renal disease: Secondary | ICD-10-CM | POA: Diagnosis not present

## 2023-10-03 DIAGNOSIS — D631 Anemia in chronic kidney disease: Secondary | ICD-10-CM | POA: Diagnosis not present

## 2023-10-03 DIAGNOSIS — K769 Liver disease, unspecified: Secondary | ICD-10-CM | POA: Diagnosis not present

## 2023-10-03 DIAGNOSIS — D509 Iron deficiency anemia, unspecified: Secondary | ICD-10-CM | POA: Diagnosis not present

## 2023-10-03 DIAGNOSIS — N2581 Secondary hyperparathyroidism of renal origin: Secondary | ICD-10-CM | POA: Diagnosis not present

## 2023-10-03 DIAGNOSIS — R8290 Unspecified abnormal findings in urine: Secondary | ICD-10-CM | POA: Diagnosis not present

## 2023-10-04 DIAGNOSIS — D631 Anemia in chronic kidney disease: Secondary | ICD-10-CM | POA: Diagnosis not present

## 2023-10-04 DIAGNOSIS — K769 Liver disease, unspecified: Secondary | ICD-10-CM | POA: Diagnosis not present

## 2023-10-04 DIAGNOSIS — Z992 Dependence on renal dialysis: Secondary | ICD-10-CM | POA: Diagnosis not present

## 2023-10-04 DIAGNOSIS — D509 Iron deficiency anemia, unspecified: Secondary | ICD-10-CM | POA: Diagnosis not present

## 2023-10-04 DIAGNOSIS — N2581 Secondary hyperparathyroidism of renal origin: Secondary | ICD-10-CM | POA: Diagnosis not present

## 2023-10-04 DIAGNOSIS — N186 End stage renal disease: Secondary | ICD-10-CM | POA: Diagnosis not present

## 2023-10-04 DIAGNOSIS — R8290 Unspecified abnormal findings in urine: Secondary | ICD-10-CM | POA: Diagnosis not present

## 2023-10-04 DIAGNOSIS — Z4932 Encounter for adequacy testing for peritoneal dialysis: Secondary | ICD-10-CM | POA: Diagnosis not present

## 2023-10-05 DIAGNOSIS — N2581 Secondary hyperparathyroidism of renal origin: Secondary | ICD-10-CM | POA: Diagnosis not present

## 2023-10-05 DIAGNOSIS — R8290 Unspecified abnormal findings in urine: Secondary | ICD-10-CM | POA: Diagnosis not present

## 2023-10-05 DIAGNOSIS — N186 End stage renal disease: Secondary | ICD-10-CM | POA: Diagnosis not present

## 2023-10-05 DIAGNOSIS — K769 Liver disease, unspecified: Secondary | ICD-10-CM | POA: Diagnosis not present

## 2023-10-05 DIAGNOSIS — Z4932 Encounter for adequacy testing for peritoneal dialysis: Secondary | ICD-10-CM | POA: Diagnosis not present

## 2023-10-05 DIAGNOSIS — D631 Anemia in chronic kidney disease: Secondary | ICD-10-CM | POA: Diagnosis not present

## 2023-10-05 DIAGNOSIS — Z992 Dependence on renal dialysis: Secondary | ICD-10-CM | POA: Diagnosis not present

## 2023-10-05 DIAGNOSIS — D509 Iron deficiency anemia, unspecified: Secondary | ICD-10-CM | POA: Diagnosis not present

## 2023-10-06 DIAGNOSIS — N2581 Secondary hyperparathyroidism of renal origin: Secondary | ICD-10-CM | POA: Diagnosis not present

## 2023-10-06 DIAGNOSIS — R8290 Unspecified abnormal findings in urine: Secondary | ICD-10-CM | POA: Diagnosis not present

## 2023-10-06 DIAGNOSIS — D509 Iron deficiency anemia, unspecified: Secondary | ICD-10-CM | POA: Diagnosis not present

## 2023-10-06 DIAGNOSIS — K769 Liver disease, unspecified: Secondary | ICD-10-CM | POA: Diagnosis not present

## 2023-10-06 DIAGNOSIS — Z992 Dependence on renal dialysis: Secondary | ICD-10-CM | POA: Diagnosis not present

## 2023-10-06 DIAGNOSIS — D631 Anemia in chronic kidney disease: Secondary | ICD-10-CM | POA: Diagnosis not present

## 2023-10-06 DIAGNOSIS — N186 End stage renal disease: Secondary | ICD-10-CM | POA: Diagnosis not present

## 2023-10-06 DIAGNOSIS — Z4932 Encounter for adequacy testing for peritoneal dialysis: Secondary | ICD-10-CM | POA: Diagnosis not present

## 2023-10-07 DIAGNOSIS — R8290 Unspecified abnormal findings in urine: Secondary | ICD-10-CM | POA: Diagnosis not present

## 2023-10-07 DIAGNOSIS — Z992 Dependence on renal dialysis: Secondary | ICD-10-CM | POA: Diagnosis not present

## 2023-10-07 DIAGNOSIS — K769 Liver disease, unspecified: Secondary | ICD-10-CM | POA: Diagnosis not present

## 2023-10-07 DIAGNOSIS — D509 Iron deficiency anemia, unspecified: Secondary | ICD-10-CM | POA: Diagnosis not present

## 2023-10-07 DIAGNOSIS — N186 End stage renal disease: Secondary | ICD-10-CM | POA: Diagnosis not present

## 2023-10-07 DIAGNOSIS — D631 Anemia in chronic kidney disease: Secondary | ICD-10-CM | POA: Diagnosis not present

## 2023-10-07 DIAGNOSIS — N2581 Secondary hyperparathyroidism of renal origin: Secondary | ICD-10-CM | POA: Diagnosis not present

## 2023-10-07 DIAGNOSIS — Z4932 Encounter for adequacy testing for peritoneal dialysis: Secondary | ICD-10-CM | POA: Diagnosis not present

## 2023-10-08 DIAGNOSIS — R8290 Unspecified abnormal findings in urine: Secondary | ICD-10-CM | POA: Diagnosis not present

## 2023-10-08 DIAGNOSIS — Z4932 Encounter for adequacy testing for peritoneal dialysis: Secondary | ICD-10-CM | POA: Diagnosis not present

## 2023-10-08 DIAGNOSIS — K769 Liver disease, unspecified: Secondary | ICD-10-CM | POA: Diagnosis not present

## 2023-10-08 DIAGNOSIS — Z992 Dependence on renal dialysis: Secondary | ICD-10-CM | POA: Diagnosis not present

## 2023-10-08 DIAGNOSIS — D509 Iron deficiency anemia, unspecified: Secondary | ICD-10-CM | POA: Diagnosis not present

## 2023-10-08 DIAGNOSIS — N2581 Secondary hyperparathyroidism of renal origin: Secondary | ICD-10-CM | POA: Diagnosis not present

## 2023-10-08 DIAGNOSIS — N186 End stage renal disease: Secondary | ICD-10-CM | POA: Diagnosis not present

## 2023-10-08 DIAGNOSIS — D631 Anemia in chronic kidney disease: Secondary | ICD-10-CM | POA: Diagnosis not present

## 2023-10-09 DIAGNOSIS — R8290 Unspecified abnormal findings in urine: Secondary | ICD-10-CM | POA: Diagnosis not present

## 2023-10-09 DIAGNOSIS — Z992 Dependence on renal dialysis: Secondary | ICD-10-CM | POA: Diagnosis not present

## 2023-10-09 DIAGNOSIS — D631 Anemia in chronic kidney disease: Secondary | ICD-10-CM | POA: Diagnosis not present

## 2023-10-09 DIAGNOSIS — Z4932 Encounter for adequacy testing for peritoneal dialysis: Secondary | ICD-10-CM | POA: Diagnosis not present

## 2023-10-09 DIAGNOSIS — N2581 Secondary hyperparathyroidism of renal origin: Secondary | ICD-10-CM | POA: Diagnosis not present

## 2023-10-09 DIAGNOSIS — N186 End stage renal disease: Secondary | ICD-10-CM | POA: Diagnosis not present

## 2023-10-09 DIAGNOSIS — K769 Liver disease, unspecified: Secondary | ICD-10-CM | POA: Diagnosis not present

## 2023-10-09 DIAGNOSIS — D509 Iron deficiency anemia, unspecified: Secondary | ICD-10-CM | POA: Diagnosis not present

## 2023-10-10 DIAGNOSIS — R8290 Unspecified abnormal findings in urine: Secondary | ICD-10-CM | POA: Diagnosis not present

## 2023-10-10 DIAGNOSIS — N186 End stage renal disease: Secondary | ICD-10-CM | POA: Diagnosis not present

## 2023-10-10 DIAGNOSIS — K769 Liver disease, unspecified: Secondary | ICD-10-CM | POA: Diagnosis not present

## 2023-10-10 DIAGNOSIS — D631 Anemia in chronic kidney disease: Secondary | ICD-10-CM | POA: Diagnosis not present

## 2023-10-10 DIAGNOSIS — N2581 Secondary hyperparathyroidism of renal origin: Secondary | ICD-10-CM | POA: Diagnosis not present

## 2023-10-10 DIAGNOSIS — D509 Iron deficiency anemia, unspecified: Secondary | ICD-10-CM | POA: Diagnosis not present

## 2023-10-10 DIAGNOSIS — Z4932 Encounter for adequacy testing for peritoneal dialysis: Secondary | ICD-10-CM | POA: Diagnosis not present

## 2023-10-10 DIAGNOSIS — Z992 Dependence on renal dialysis: Secondary | ICD-10-CM | POA: Diagnosis not present

## 2023-10-11 DIAGNOSIS — R8290 Unspecified abnormal findings in urine: Secondary | ICD-10-CM | POA: Diagnosis not present

## 2023-10-11 DIAGNOSIS — N186 End stage renal disease: Secondary | ICD-10-CM | POA: Diagnosis not present

## 2023-10-11 DIAGNOSIS — D509 Iron deficiency anemia, unspecified: Secondary | ICD-10-CM | POA: Diagnosis not present

## 2023-10-11 DIAGNOSIS — Z992 Dependence on renal dialysis: Secondary | ICD-10-CM | POA: Diagnosis not present

## 2023-10-11 DIAGNOSIS — D631 Anemia in chronic kidney disease: Secondary | ICD-10-CM | POA: Diagnosis not present

## 2023-10-11 DIAGNOSIS — Z4932 Encounter for adequacy testing for peritoneal dialysis: Secondary | ICD-10-CM | POA: Diagnosis not present

## 2023-10-11 DIAGNOSIS — K769 Liver disease, unspecified: Secondary | ICD-10-CM | POA: Diagnosis not present

## 2023-10-11 DIAGNOSIS — N2581 Secondary hyperparathyroidism of renal origin: Secondary | ICD-10-CM | POA: Diagnosis not present

## 2023-10-12 DIAGNOSIS — K769 Liver disease, unspecified: Secondary | ICD-10-CM | POA: Diagnosis not present

## 2023-10-12 DIAGNOSIS — Z992 Dependence on renal dialysis: Secondary | ICD-10-CM | POA: Diagnosis not present

## 2023-10-12 DIAGNOSIS — I129 Hypertensive chronic kidney disease with stage 1 through stage 4 chronic kidney disease, or unspecified chronic kidney disease: Secondary | ICD-10-CM | POA: Diagnosis not present

## 2023-10-12 DIAGNOSIS — R8290 Unspecified abnormal findings in urine: Secondary | ICD-10-CM | POA: Diagnosis not present

## 2023-10-12 DIAGNOSIS — Z4932 Encounter for adequacy testing for peritoneal dialysis: Secondary | ICD-10-CM | POA: Diagnosis not present

## 2023-10-12 DIAGNOSIS — D509 Iron deficiency anemia, unspecified: Secondary | ICD-10-CM | POA: Diagnosis not present

## 2023-10-12 DIAGNOSIS — N186 End stage renal disease: Secondary | ICD-10-CM | POA: Diagnosis not present

## 2023-10-12 DIAGNOSIS — N2581 Secondary hyperparathyroidism of renal origin: Secondary | ICD-10-CM | POA: Diagnosis not present

## 2023-10-12 DIAGNOSIS — D631 Anemia in chronic kidney disease: Secondary | ICD-10-CM | POA: Diagnosis not present

## 2023-10-13 DIAGNOSIS — K769 Liver disease, unspecified: Secondary | ICD-10-CM | POA: Diagnosis not present

## 2023-10-13 DIAGNOSIS — Z23 Encounter for immunization: Secondary | ICD-10-CM | POA: Diagnosis not present

## 2023-10-13 DIAGNOSIS — Z992 Dependence on renal dialysis: Secondary | ICD-10-CM | POA: Diagnosis not present

## 2023-10-13 DIAGNOSIS — N186 End stage renal disease: Secondary | ICD-10-CM | POA: Diagnosis not present

## 2023-10-13 DIAGNOSIS — D509 Iron deficiency anemia, unspecified: Secondary | ICD-10-CM | POA: Diagnosis not present

## 2023-10-13 DIAGNOSIS — R8290 Unspecified abnormal findings in urine: Secondary | ICD-10-CM | POA: Diagnosis not present

## 2023-10-13 DIAGNOSIS — N2581 Secondary hyperparathyroidism of renal origin: Secondary | ICD-10-CM | POA: Diagnosis not present

## 2023-10-13 DIAGNOSIS — D631 Anemia in chronic kidney disease: Secondary | ICD-10-CM | POA: Diagnosis not present

## 2023-10-14 ENCOUNTER — Telehealth: Payer: Self-pay | Admitting: Internal Medicine

## 2023-10-14 ENCOUNTER — Other Ambulatory Visit: Payer: Self-pay

## 2023-10-14 DIAGNOSIS — N186 End stage renal disease: Secondary | ICD-10-CM | POA: Diagnosis not present

## 2023-10-14 DIAGNOSIS — D509 Iron deficiency anemia, unspecified: Secondary | ICD-10-CM | POA: Diagnosis not present

## 2023-10-14 DIAGNOSIS — I4891 Unspecified atrial fibrillation: Secondary | ICD-10-CM

## 2023-10-14 DIAGNOSIS — D631 Anemia in chronic kidney disease: Secondary | ICD-10-CM | POA: Diagnosis not present

## 2023-10-14 DIAGNOSIS — Z23 Encounter for immunization: Secondary | ICD-10-CM | POA: Diagnosis not present

## 2023-10-14 DIAGNOSIS — N2581 Secondary hyperparathyroidism of renal origin: Secondary | ICD-10-CM | POA: Diagnosis not present

## 2023-10-14 DIAGNOSIS — R8290 Unspecified abnormal findings in urine: Secondary | ICD-10-CM | POA: Diagnosis not present

## 2023-10-14 DIAGNOSIS — Z992 Dependence on renal dialysis: Secondary | ICD-10-CM | POA: Diagnosis not present

## 2023-10-14 DIAGNOSIS — K769 Liver disease, unspecified: Secondary | ICD-10-CM | POA: Diagnosis not present

## 2023-10-14 MED ORDER — APIXABAN 2.5 MG PO TABS: 2.5000 mg | ORAL_TABLET | Freq: Two times a day (BID) | ORAL | 3 refills | Status: DC

## 2023-10-14 MED ORDER — APIXABAN 2.5 MG PO TABS: 2.5000 mg | ORAL_TABLET | Freq: Two times a day (BID) | ORAL | 1 refills | Status: DC

## 2023-10-14 NOTE — Telephone Encounter (Signed)
Prescription refill request for Eliquis received. Indication: Afib  Last office visit: 06/21/23 ([place])  Scr: 10.70 (01/20/23)  Age: 89 Weight: 61.5kg  Appropriate dose. Refill printed for Vikki Ports, triage RN.

## 2023-10-14 NOTE — Telephone Encounter (Signed)
Pt c/o medication issue:  1. Name of Medication: Eliquis  2. How are you currently taking this medication (dosage and times per day)? As directed  3. Are you having a reaction (difficulty breathing--STAT)? no  4. What is your medication issue? Spoke with Jorene Minors from [facility], she is Child psychotherapist there and states that the patient is having a hard time paying his copay for his eliquis. She states that her office works closely with [facility] and we can fax over a script to them then they can provide assistance for the patient. Fax number is [phone]. On the fax, if there could be a note that states "Copay Only" or "Contact patient for copay consent" that would be helpful.

## 2023-10-14 NOTE — Telephone Encounter (Signed)
Spoke with Coumadin clinic to verify okay to print prescription for Eliquis 2.5- mg.  Prescription printed by Abby. Faxed script to [facility] [phone].

## 2023-10-15 DIAGNOSIS — Z992 Dependence on renal dialysis: Secondary | ICD-10-CM | POA: Diagnosis not present

## 2023-10-15 DIAGNOSIS — K769 Liver disease, unspecified: Secondary | ICD-10-CM | POA: Diagnosis not present

## 2023-10-15 DIAGNOSIS — D509 Iron deficiency anemia, unspecified: Secondary | ICD-10-CM | POA: Diagnosis not present

## 2023-10-15 DIAGNOSIS — N2581 Secondary hyperparathyroidism of renal origin: Secondary | ICD-10-CM | POA: Diagnosis not present

## 2023-10-15 DIAGNOSIS — D631 Anemia in chronic kidney disease: Secondary | ICD-10-CM | POA: Diagnosis not present

## 2023-10-15 DIAGNOSIS — N186 End stage renal disease: Secondary | ICD-10-CM | POA: Diagnosis not present

## 2023-10-15 DIAGNOSIS — R8290 Unspecified abnormal findings in urine: Secondary | ICD-10-CM | POA: Diagnosis not present

## 2023-10-15 DIAGNOSIS — Z23 Encounter for immunization: Secondary | ICD-10-CM | POA: Diagnosis not present

## 2023-10-16 DIAGNOSIS — Z23 Encounter for immunization: Secondary | ICD-10-CM | POA: Diagnosis not present

## 2023-10-16 DIAGNOSIS — D631 Anemia in chronic kidney disease: Secondary | ICD-10-CM | POA: Diagnosis not present

## 2023-10-16 DIAGNOSIS — K769 Liver disease, unspecified: Secondary | ICD-10-CM | POA: Diagnosis not present

## 2023-10-16 DIAGNOSIS — R8290 Unspecified abnormal findings in urine: Secondary | ICD-10-CM | POA: Diagnosis not present

## 2023-10-16 DIAGNOSIS — N2581 Secondary hyperparathyroidism of renal origin: Secondary | ICD-10-CM | POA: Diagnosis not present

## 2023-10-16 DIAGNOSIS — Z992 Dependence on renal dialysis: Secondary | ICD-10-CM | POA: Diagnosis not present

## 2023-10-16 DIAGNOSIS — D509 Iron deficiency anemia, unspecified: Secondary | ICD-10-CM | POA: Diagnosis not present

## 2023-10-16 DIAGNOSIS — N186 End stage renal disease: Secondary | ICD-10-CM | POA: Diagnosis not present

## 2023-10-17 DIAGNOSIS — K769 Liver disease, unspecified: Secondary | ICD-10-CM | POA: Diagnosis not present

## 2023-10-17 DIAGNOSIS — N186 End stage renal disease: Secondary | ICD-10-CM | POA: Diagnosis not present

## 2023-10-17 DIAGNOSIS — N2581 Secondary hyperparathyroidism of renal origin: Secondary | ICD-10-CM | POA: Diagnosis not present

## 2023-10-17 DIAGNOSIS — D631 Anemia in chronic kidney disease: Secondary | ICD-10-CM | POA: Diagnosis not present

## 2023-10-17 DIAGNOSIS — R8290 Unspecified abnormal findings in urine: Secondary | ICD-10-CM | POA: Diagnosis not present

## 2023-10-17 DIAGNOSIS — Z992 Dependence on renal dialysis: Secondary | ICD-10-CM | POA: Diagnosis not present

## 2023-10-17 DIAGNOSIS — D509 Iron deficiency anemia, unspecified: Secondary | ICD-10-CM | POA: Diagnosis not present

## 2023-10-17 DIAGNOSIS — Z23 Encounter for immunization: Secondary | ICD-10-CM | POA: Diagnosis not present

## 2023-10-18 DIAGNOSIS — Z23 Encounter for immunization: Secondary | ICD-10-CM | POA: Diagnosis not present

## 2023-10-18 DIAGNOSIS — R8290 Unspecified abnormal findings in urine: Secondary | ICD-10-CM | POA: Diagnosis not present

## 2023-10-18 DIAGNOSIS — N186 End stage renal disease: Secondary | ICD-10-CM | POA: Diagnosis not present

## 2023-10-18 DIAGNOSIS — N2581 Secondary hyperparathyroidism of renal origin: Secondary | ICD-10-CM | POA: Diagnosis not present

## 2023-10-18 DIAGNOSIS — K769 Liver disease, unspecified: Secondary | ICD-10-CM | POA: Diagnosis not present

## 2023-10-18 DIAGNOSIS — D509 Iron deficiency anemia, unspecified: Secondary | ICD-10-CM | POA: Diagnosis not present

## 2023-10-18 DIAGNOSIS — D631 Anemia in chronic kidney disease: Secondary | ICD-10-CM | POA: Diagnosis not present

## 2023-10-18 DIAGNOSIS — Z992 Dependence on renal dialysis: Secondary | ICD-10-CM | POA: Diagnosis not present

## 2023-10-19 DIAGNOSIS — N2581 Secondary hyperparathyroidism of renal origin: Secondary | ICD-10-CM | POA: Diagnosis not present

## 2023-10-19 DIAGNOSIS — R8290 Unspecified abnormal findings in urine: Secondary | ICD-10-CM | POA: Diagnosis not present

## 2023-10-19 DIAGNOSIS — D509 Iron deficiency anemia, unspecified: Secondary | ICD-10-CM | POA: Diagnosis not present

## 2023-10-19 DIAGNOSIS — K769 Liver disease, unspecified: Secondary | ICD-10-CM | POA: Diagnosis not present

## 2023-10-19 DIAGNOSIS — D631 Anemia in chronic kidney disease: Secondary | ICD-10-CM | POA: Diagnosis not present

## 2023-10-19 DIAGNOSIS — Z992 Dependence on renal dialysis: Secondary | ICD-10-CM | POA: Diagnosis not present

## 2023-10-19 DIAGNOSIS — N186 End stage renal disease: Secondary | ICD-10-CM | POA: Diagnosis not present

## 2023-10-19 DIAGNOSIS — Z23 Encounter for immunization: Secondary | ICD-10-CM | POA: Diagnosis not present

## 2023-10-20 DIAGNOSIS — D631 Anemia in chronic kidney disease: Secondary | ICD-10-CM | POA: Diagnosis not present

## 2023-10-20 DIAGNOSIS — K769 Liver disease, unspecified: Secondary | ICD-10-CM | POA: Diagnosis not present

## 2023-10-20 DIAGNOSIS — D509 Iron deficiency anemia, unspecified: Secondary | ICD-10-CM | POA: Diagnosis not present

## 2023-10-20 DIAGNOSIS — R8290 Unspecified abnormal findings in urine: Secondary | ICD-10-CM | POA: Diagnosis not present

## 2023-10-20 DIAGNOSIS — Z23 Encounter for immunization: Secondary | ICD-10-CM | POA: Diagnosis not present

## 2023-10-20 DIAGNOSIS — N186 End stage renal disease: Secondary | ICD-10-CM | POA: Diagnosis not present

## 2023-10-20 DIAGNOSIS — Z992 Dependence on renal dialysis: Secondary | ICD-10-CM | POA: Diagnosis not present

## 2023-10-20 DIAGNOSIS — N2581 Secondary hyperparathyroidism of renal origin: Secondary | ICD-10-CM | POA: Diagnosis not present

## 2023-10-21 DIAGNOSIS — D631 Anemia in chronic kidney disease: Secondary | ICD-10-CM | POA: Diagnosis not present

## 2023-10-21 DIAGNOSIS — D509 Iron deficiency anemia, unspecified: Secondary | ICD-10-CM | POA: Diagnosis not present

## 2023-10-21 DIAGNOSIS — K769 Liver disease, unspecified: Secondary | ICD-10-CM | POA: Diagnosis not present

## 2023-10-21 DIAGNOSIS — Z23 Encounter for immunization: Secondary | ICD-10-CM | POA: Diagnosis not present

## 2023-10-21 DIAGNOSIS — R8290 Unspecified abnormal findings in urine: Secondary | ICD-10-CM | POA: Diagnosis not present

## 2023-10-21 DIAGNOSIS — N186 End stage renal disease: Secondary | ICD-10-CM | POA: Diagnosis not present

## 2023-10-21 DIAGNOSIS — Z992 Dependence on renal dialysis: Secondary | ICD-10-CM | POA: Diagnosis not present

## 2023-10-21 DIAGNOSIS — N2581 Secondary hyperparathyroidism of renal origin: Secondary | ICD-10-CM | POA: Diagnosis not present

## 2023-10-22 DIAGNOSIS — N2581 Secondary hyperparathyroidism of renal origin: Secondary | ICD-10-CM | POA: Diagnosis not present

## 2023-10-22 DIAGNOSIS — Z992 Dependence on renal dialysis: Secondary | ICD-10-CM | POA: Diagnosis not present

## 2023-10-22 DIAGNOSIS — N186 End stage renal disease: Secondary | ICD-10-CM | POA: Diagnosis not present

## 2023-10-22 DIAGNOSIS — Z23 Encounter for immunization: Secondary | ICD-10-CM | POA: Diagnosis not present

## 2023-10-22 DIAGNOSIS — D631 Anemia in chronic kidney disease: Secondary | ICD-10-CM | POA: Diagnosis not present

## 2023-10-22 DIAGNOSIS — K769 Liver disease, unspecified: Secondary | ICD-10-CM | POA: Diagnosis not present

## 2023-10-22 DIAGNOSIS — R8290 Unspecified abnormal findings in urine: Secondary | ICD-10-CM | POA: Diagnosis not present

## 2023-10-22 DIAGNOSIS — D509 Iron deficiency anemia, unspecified: Secondary | ICD-10-CM | POA: Diagnosis not present

## 2023-10-23 DIAGNOSIS — Z23 Encounter for immunization: Secondary | ICD-10-CM | POA: Diagnosis not present

## 2023-10-23 DIAGNOSIS — R8290 Unspecified abnormal findings in urine: Secondary | ICD-10-CM | POA: Diagnosis not present

## 2023-10-23 DIAGNOSIS — N2581 Secondary hyperparathyroidism of renal origin: Secondary | ICD-10-CM | POA: Diagnosis not present

## 2023-10-23 DIAGNOSIS — K769 Liver disease, unspecified: Secondary | ICD-10-CM | POA: Diagnosis not present

## 2023-10-23 DIAGNOSIS — D631 Anemia in chronic kidney disease: Secondary | ICD-10-CM | POA: Diagnosis not present

## 2023-10-23 DIAGNOSIS — Z992 Dependence on renal dialysis: Secondary | ICD-10-CM | POA: Diagnosis not present

## 2023-10-23 DIAGNOSIS — D509 Iron deficiency anemia, unspecified: Secondary | ICD-10-CM | POA: Diagnosis not present

## 2023-10-23 DIAGNOSIS — N186 End stage renal disease: Secondary | ICD-10-CM | POA: Diagnosis not present

## 2023-10-24 DIAGNOSIS — Z992 Dependence on renal dialysis: Secondary | ICD-10-CM | POA: Diagnosis not present

## 2023-10-24 DIAGNOSIS — K769 Liver disease, unspecified: Secondary | ICD-10-CM | POA: Diagnosis not present

## 2023-10-24 DIAGNOSIS — N186 End stage renal disease: Secondary | ICD-10-CM | POA: Diagnosis not present

## 2023-10-24 DIAGNOSIS — D509 Iron deficiency anemia, unspecified: Secondary | ICD-10-CM | POA: Diagnosis not present

## 2023-10-24 DIAGNOSIS — Z23 Encounter for immunization: Secondary | ICD-10-CM | POA: Diagnosis not present

## 2023-10-24 DIAGNOSIS — D631 Anemia in chronic kidney disease: Secondary | ICD-10-CM | POA: Diagnosis not present

## 2023-10-24 DIAGNOSIS — R8290 Unspecified abnormal findings in urine: Secondary | ICD-10-CM | POA: Diagnosis not present

## 2023-10-24 DIAGNOSIS — N2581 Secondary hyperparathyroidism of renal origin: Secondary | ICD-10-CM | POA: Diagnosis not present

## 2023-10-25 DIAGNOSIS — K769 Liver disease, unspecified: Secondary | ICD-10-CM | POA: Diagnosis not present

## 2023-10-25 DIAGNOSIS — N186 End stage renal disease: Secondary | ICD-10-CM | POA: Diagnosis not present

## 2023-10-25 DIAGNOSIS — Z992 Dependence on renal dialysis: Secondary | ICD-10-CM | POA: Diagnosis not present

## 2023-10-25 DIAGNOSIS — Z23 Encounter for immunization: Secondary | ICD-10-CM | POA: Diagnosis not present

## 2023-10-25 DIAGNOSIS — R8290 Unspecified abnormal findings in urine: Secondary | ICD-10-CM | POA: Diagnosis not present

## 2023-10-25 DIAGNOSIS — N2581 Secondary hyperparathyroidism of renal origin: Secondary | ICD-10-CM | POA: Diagnosis not present

## 2023-10-25 DIAGNOSIS — D509 Iron deficiency anemia, unspecified: Secondary | ICD-10-CM | POA: Diagnosis not present

## 2023-10-25 DIAGNOSIS — D631 Anemia in chronic kidney disease: Secondary | ICD-10-CM | POA: Diagnosis not present

## 2023-10-26 DIAGNOSIS — Z23 Encounter for immunization: Secondary | ICD-10-CM | POA: Diagnosis not present

## 2023-10-26 DIAGNOSIS — D631 Anemia in chronic kidney disease: Secondary | ICD-10-CM | POA: Diagnosis not present

## 2023-10-26 DIAGNOSIS — N186 End stage renal disease: Secondary | ICD-10-CM | POA: Diagnosis not present

## 2023-10-26 DIAGNOSIS — R8290 Unspecified abnormal findings in urine: Secondary | ICD-10-CM | POA: Diagnosis not present

## 2023-10-26 DIAGNOSIS — N2581 Secondary hyperparathyroidism of renal origin: Secondary | ICD-10-CM | POA: Diagnosis not present

## 2023-10-26 DIAGNOSIS — K769 Liver disease, unspecified: Secondary | ICD-10-CM | POA: Diagnosis not present

## 2023-10-26 DIAGNOSIS — D509 Iron deficiency anemia, unspecified: Secondary | ICD-10-CM | POA: Diagnosis not present

## 2023-10-26 DIAGNOSIS — Z992 Dependence on renal dialysis: Secondary | ICD-10-CM | POA: Diagnosis not present

## 2023-10-27 DIAGNOSIS — D509 Iron deficiency anemia, unspecified: Secondary | ICD-10-CM | POA: Diagnosis not present

## 2023-10-27 DIAGNOSIS — R8290 Unspecified abnormal findings in urine: Secondary | ICD-10-CM | POA: Diagnosis not present

## 2023-10-27 DIAGNOSIS — N186 End stage renal disease: Secondary | ICD-10-CM | POA: Diagnosis not present

## 2023-10-27 DIAGNOSIS — Z992 Dependence on renal dialysis: Secondary | ICD-10-CM | POA: Diagnosis not present

## 2023-10-27 DIAGNOSIS — K769 Liver disease, unspecified: Secondary | ICD-10-CM | POA: Diagnosis not present

## 2023-10-27 DIAGNOSIS — Z23 Encounter for immunization: Secondary | ICD-10-CM | POA: Diagnosis not present

## 2023-10-27 DIAGNOSIS — D631 Anemia in chronic kidney disease: Secondary | ICD-10-CM | POA: Diagnosis not present

## 2023-10-27 DIAGNOSIS — N2581 Secondary hyperparathyroidism of renal origin: Secondary | ICD-10-CM | POA: Diagnosis not present

## 2023-10-28 DIAGNOSIS — R8290 Unspecified abnormal findings in urine: Secondary | ICD-10-CM | POA: Diagnosis not present

## 2023-10-28 DIAGNOSIS — N2581 Secondary hyperparathyroidism of renal origin: Secondary | ICD-10-CM | POA: Diagnosis not present

## 2023-10-28 DIAGNOSIS — D631 Anemia in chronic kidney disease: Secondary | ICD-10-CM | POA: Diagnosis not present

## 2023-10-28 DIAGNOSIS — N186 End stage renal disease: Secondary | ICD-10-CM | POA: Diagnosis not present

## 2023-10-28 DIAGNOSIS — K769 Liver disease, unspecified: Secondary | ICD-10-CM | POA: Diagnosis not present

## 2023-10-28 DIAGNOSIS — D509 Iron deficiency anemia, unspecified: Secondary | ICD-10-CM | POA: Diagnosis not present

## 2023-10-28 DIAGNOSIS — Z23 Encounter for immunization: Secondary | ICD-10-CM | POA: Diagnosis not present

## 2023-10-28 DIAGNOSIS — Z992 Dependence on renal dialysis: Secondary | ICD-10-CM | POA: Diagnosis not present

## 2023-10-29 DIAGNOSIS — K769 Liver disease, unspecified: Secondary | ICD-10-CM | POA: Diagnosis not present

## 2023-10-29 DIAGNOSIS — D509 Iron deficiency anemia, unspecified: Secondary | ICD-10-CM | POA: Diagnosis not present

## 2023-10-29 DIAGNOSIS — Z992 Dependence on renal dialysis: Secondary | ICD-10-CM | POA: Diagnosis not present

## 2023-10-29 DIAGNOSIS — N186 End stage renal disease: Secondary | ICD-10-CM | POA: Diagnosis not present

## 2023-10-29 DIAGNOSIS — Z23 Encounter for immunization: Secondary | ICD-10-CM | POA: Diagnosis not present

## 2023-10-29 DIAGNOSIS — N2581 Secondary hyperparathyroidism of renal origin: Secondary | ICD-10-CM | POA: Diagnosis not present

## 2023-10-29 DIAGNOSIS — D631 Anemia in chronic kidney disease: Secondary | ICD-10-CM | POA: Diagnosis not present

## 2023-10-29 DIAGNOSIS — R8290 Unspecified abnormal findings in urine: Secondary | ICD-10-CM | POA: Diagnosis not present

## 2023-10-30 DIAGNOSIS — N2581 Secondary hyperparathyroidism of renal origin: Secondary | ICD-10-CM | POA: Diagnosis not present

## 2023-10-30 DIAGNOSIS — N186 End stage renal disease: Secondary | ICD-10-CM | POA: Diagnosis not present

## 2023-10-30 DIAGNOSIS — R8290 Unspecified abnormal findings in urine: Secondary | ICD-10-CM | POA: Diagnosis not present

## 2023-10-30 DIAGNOSIS — Z23 Encounter for immunization: Secondary | ICD-10-CM | POA: Diagnosis not present

## 2023-10-30 DIAGNOSIS — D509 Iron deficiency anemia, unspecified: Secondary | ICD-10-CM | POA: Diagnosis not present

## 2023-10-30 DIAGNOSIS — D631 Anemia in chronic kidney disease: Secondary | ICD-10-CM | POA: Diagnosis not present

## 2023-10-30 DIAGNOSIS — K769 Liver disease, unspecified: Secondary | ICD-10-CM | POA: Diagnosis not present

## 2023-10-30 DIAGNOSIS — Z992 Dependence on renal dialysis: Secondary | ICD-10-CM | POA: Diagnosis not present

## 2023-10-31 DIAGNOSIS — Z992 Dependence on renal dialysis: Secondary | ICD-10-CM | POA: Diagnosis not present

## 2023-10-31 DIAGNOSIS — R8290 Unspecified abnormal findings in urine: Secondary | ICD-10-CM | POA: Diagnosis not present

## 2023-10-31 DIAGNOSIS — D631 Anemia in chronic kidney disease: Secondary | ICD-10-CM | POA: Diagnosis not present

## 2023-10-31 DIAGNOSIS — D509 Iron deficiency anemia, unspecified: Secondary | ICD-10-CM | POA: Diagnosis not present

## 2023-10-31 DIAGNOSIS — N186 End stage renal disease: Secondary | ICD-10-CM | POA: Diagnosis not present

## 2023-10-31 DIAGNOSIS — Z23 Encounter for immunization: Secondary | ICD-10-CM | POA: Diagnosis not present

## 2023-10-31 DIAGNOSIS — N2581 Secondary hyperparathyroidism of renal origin: Secondary | ICD-10-CM | POA: Diagnosis not present

## 2023-10-31 DIAGNOSIS — K769 Liver disease, unspecified: Secondary | ICD-10-CM | POA: Diagnosis not present

## 2023-11-01 DIAGNOSIS — R8290 Unspecified abnormal findings in urine: Secondary | ICD-10-CM | POA: Diagnosis not present

## 2023-11-01 DIAGNOSIS — Z992 Dependence on renal dialysis: Secondary | ICD-10-CM | POA: Diagnosis not present

## 2023-11-01 DIAGNOSIS — D631 Anemia in chronic kidney disease: Secondary | ICD-10-CM | POA: Diagnosis not present

## 2023-11-01 DIAGNOSIS — N186 End stage renal disease: Secondary | ICD-10-CM | POA: Diagnosis not present

## 2023-11-01 DIAGNOSIS — K769 Liver disease, unspecified: Secondary | ICD-10-CM | POA: Diagnosis not present

## 2023-11-01 DIAGNOSIS — N2581 Secondary hyperparathyroidism of renal origin: Secondary | ICD-10-CM | POA: Diagnosis not present

## 2023-11-01 DIAGNOSIS — Z23 Encounter for immunization: Secondary | ICD-10-CM | POA: Diagnosis not present

## 2023-11-01 DIAGNOSIS — D509 Iron deficiency anemia, unspecified: Secondary | ICD-10-CM | POA: Diagnosis not present

## 2023-11-02 DIAGNOSIS — N186 End stage renal disease: Secondary | ICD-10-CM | POA: Diagnosis not present

## 2023-11-02 DIAGNOSIS — K769 Liver disease, unspecified: Secondary | ICD-10-CM | POA: Diagnosis not present

## 2023-11-02 DIAGNOSIS — Z23 Encounter for immunization: Secondary | ICD-10-CM | POA: Diagnosis not present

## 2023-11-02 DIAGNOSIS — Z992 Dependence on renal dialysis: Secondary | ICD-10-CM | POA: Diagnosis not present

## 2023-11-02 DIAGNOSIS — D509 Iron deficiency anemia, unspecified: Secondary | ICD-10-CM | POA: Diagnosis not present

## 2023-11-02 DIAGNOSIS — R8290 Unspecified abnormal findings in urine: Secondary | ICD-10-CM | POA: Diagnosis not present

## 2023-11-02 DIAGNOSIS — N2581 Secondary hyperparathyroidism of renal origin: Secondary | ICD-10-CM | POA: Diagnosis not present

## 2023-11-02 DIAGNOSIS — D631 Anemia in chronic kidney disease: Secondary | ICD-10-CM | POA: Diagnosis not present

## 2023-11-03 DIAGNOSIS — D509 Iron deficiency anemia, unspecified: Secondary | ICD-10-CM | POA: Diagnosis not present

## 2023-11-03 DIAGNOSIS — N186 End stage renal disease: Secondary | ICD-10-CM | POA: Diagnosis not present

## 2023-11-03 DIAGNOSIS — Z992 Dependence on renal dialysis: Secondary | ICD-10-CM | POA: Diagnosis not present

## 2023-11-03 DIAGNOSIS — R8290 Unspecified abnormal findings in urine: Secondary | ICD-10-CM | POA: Diagnosis not present

## 2023-11-03 DIAGNOSIS — Z23 Encounter for immunization: Secondary | ICD-10-CM | POA: Diagnosis not present

## 2023-11-03 DIAGNOSIS — D631 Anemia in chronic kidney disease: Secondary | ICD-10-CM | POA: Diagnosis not present

## 2023-11-03 DIAGNOSIS — N2581 Secondary hyperparathyroidism of renal origin: Secondary | ICD-10-CM | POA: Diagnosis not present

## 2023-11-03 DIAGNOSIS — K769 Liver disease, unspecified: Secondary | ICD-10-CM | POA: Diagnosis not present

## 2023-11-04 DIAGNOSIS — R8290 Unspecified abnormal findings in urine: Secondary | ICD-10-CM | POA: Diagnosis not present

## 2023-11-04 DIAGNOSIS — K769 Liver disease, unspecified: Secondary | ICD-10-CM | POA: Diagnosis not present

## 2023-11-04 DIAGNOSIS — N186 End stage renal disease: Secondary | ICD-10-CM | POA: Diagnosis not present

## 2023-11-04 DIAGNOSIS — N2581 Secondary hyperparathyroidism of renal origin: Secondary | ICD-10-CM | POA: Diagnosis not present

## 2023-11-04 DIAGNOSIS — D631 Anemia in chronic kidney disease: Secondary | ICD-10-CM | POA: Diagnosis not present

## 2023-11-04 DIAGNOSIS — D509 Iron deficiency anemia, unspecified: Secondary | ICD-10-CM | POA: Diagnosis not present

## 2023-11-04 DIAGNOSIS — Z992 Dependence on renal dialysis: Secondary | ICD-10-CM | POA: Diagnosis not present

## 2023-11-04 DIAGNOSIS — Z23 Encounter for immunization: Secondary | ICD-10-CM | POA: Diagnosis not present

## 2023-11-05 DIAGNOSIS — D631 Anemia in chronic kidney disease: Secondary | ICD-10-CM | POA: Diagnosis not present

## 2023-11-05 DIAGNOSIS — N2581 Secondary hyperparathyroidism of renal origin: Secondary | ICD-10-CM | POA: Diagnosis not present

## 2023-11-05 DIAGNOSIS — N186 End stage renal disease: Secondary | ICD-10-CM | POA: Diagnosis not present

## 2023-11-05 DIAGNOSIS — Z23 Encounter for immunization: Secondary | ICD-10-CM | POA: Diagnosis not present

## 2023-11-05 DIAGNOSIS — R8290 Unspecified abnormal findings in urine: Secondary | ICD-10-CM | POA: Diagnosis not present

## 2023-11-05 DIAGNOSIS — D509 Iron deficiency anemia, unspecified: Secondary | ICD-10-CM | POA: Diagnosis not present

## 2023-11-05 DIAGNOSIS — K769 Liver disease, unspecified: Secondary | ICD-10-CM | POA: Diagnosis not present

## 2023-11-05 DIAGNOSIS — Z992 Dependence on renal dialysis: Secondary | ICD-10-CM | POA: Diagnosis not present

## 2023-11-06 DIAGNOSIS — Z992 Dependence on renal dialysis: Secondary | ICD-10-CM | POA: Diagnosis not present

## 2023-11-06 DIAGNOSIS — N2581 Secondary hyperparathyroidism of renal origin: Secondary | ICD-10-CM | POA: Diagnosis not present

## 2023-11-06 DIAGNOSIS — N186 End stage renal disease: Secondary | ICD-10-CM | POA: Diagnosis not present

## 2023-11-06 DIAGNOSIS — D631 Anemia in chronic kidney disease: Secondary | ICD-10-CM | POA: Diagnosis not present

## 2023-11-06 DIAGNOSIS — K769 Liver disease, unspecified: Secondary | ICD-10-CM | POA: Diagnosis not present

## 2023-11-06 DIAGNOSIS — R8290 Unspecified abnormal findings in urine: Secondary | ICD-10-CM | POA: Diagnosis not present

## 2023-11-06 DIAGNOSIS — Z23 Encounter for immunization: Secondary | ICD-10-CM | POA: Diagnosis not present

## 2023-11-06 DIAGNOSIS — D509 Iron deficiency anemia, unspecified: Secondary | ICD-10-CM | POA: Diagnosis not present

## 2023-11-07 DIAGNOSIS — K769 Liver disease, unspecified: Secondary | ICD-10-CM | POA: Diagnosis not present

## 2023-11-07 DIAGNOSIS — D631 Anemia in chronic kidney disease: Secondary | ICD-10-CM | POA: Diagnosis not present

## 2023-11-07 DIAGNOSIS — N2581 Secondary hyperparathyroidism of renal origin: Secondary | ICD-10-CM | POA: Diagnosis not present

## 2023-11-07 DIAGNOSIS — D509 Iron deficiency anemia, unspecified: Secondary | ICD-10-CM | POA: Diagnosis not present

## 2023-11-07 DIAGNOSIS — N186 End stage renal disease: Secondary | ICD-10-CM | POA: Diagnosis not present

## 2023-11-07 DIAGNOSIS — R8290 Unspecified abnormal findings in urine: Secondary | ICD-10-CM | POA: Diagnosis not present

## 2023-11-07 DIAGNOSIS — Z992 Dependence on renal dialysis: Secondary | ICD-10-CM | POA: Diagnosis not present

## 2023-11-07 DIAGNOSIS — Z23 Encounter for immunization: Secondary | ICD-10-CM | POA: Diagnosis not present

## 2023-11-08 DIAGNOSIS — D509 Iron deficiency anemia, unspecified: Secondary | ICD-10-CM | POA: Diagnosis not present

## 2023-11-08 DIAGNOSIS — K769 Liver disease, unspecified: Secondary | ICD-10-CM | POA: Diagnosis not present

## 2023-11-08 DIAGNOSIS — Z23 Encounter for immunization: Secondary | ICD-10-CM | POA: Diagnosis not present

## 2023-11-08 DIAGNOSIS — N186 End stage renal disease: Secondary | ICD-10-CM | POA: Diagnosis not present

## 2023-11-08 DIAGNOSIS — D631 Anemia in chronic kidney disease: Secondary | ICD-10-CM | POA: Diagnosis not present

## 2023-11-08 DIAGNOSIS — Z992 Dependence on renal dialysis: Secondary | ICD-10-CM | POA: Diagnosis not present

## 2023-11-08 DIAGNOSIS — N2581 Secondary hyperparathyroidism of renal origin: Secondary | ICD-10-CM | POA: Diagnosis not present

## 2023-11-08 DIAGNOSIS — R8290 Unspecified abnormal findings in urine: Secondary | ICD-10-CM | POA: Diagnosis not present

## 2023-11-09 DIAGNOSIS — R8290 Unspecified abnormal findings in urine: Secondary | ICD-10-CM | POA: Diagnosis not present

## 2023-11-09 DIAGNOSIS — N2581 Secondary hyperparathyroidism of renal origin: Secondary | ICD-10-CM | POA: Diagnosis not present

## 2023-11-09 DIAGNOSIS — K769 Liver disease, unspecified: Secondary | ICD-10-CM | POA: Diagnosis not present

## 2023-11-09 DIAGNOSIS — Z23 Encounter for immunization: Secondary | ICD-10-CM | POA: Diagnosis not present

## 2023-11-09 DIAGNOSIS — D631 Anemia in chronic kidney disease: Secondary | ICD-10-CM | POA: Diagnosis not present

## 2023-11-09 DIAGNOSIS — D509 Iron deficiency anemia, unspecified: Secondary | ICD-10-CM | POA: Diagnosis not present

## 2023-11-09 DIAGNOSIS — N186 End stage renal disease: Secondary | ICD-10-CM | POA: Diagnosis not present

## 2023-11-09 DIAGNOSIS — Z992 Dependence on renal dialysis: Secondary | ICD-10-CM | POA: Diagnosis not present

## 2023-11-10 DIAGNOSIS — R8290 Unspecified abnormal findings in urine: Secondary | ICD-10-CM | POA: Diagnosis not present

## 2023-11-10 DIAGNOSIS — N186 End stage renal disease: Secondary | ICD-10-CM | POA: Diagnosis not present

## 2023-11-10 DIAGNOSIS — D509 Iron deficiency anemia, unspecified: Secondary | ICD-10-CM | POA: Diagnosis not present

## 2023-11-10 DIAGNOSIS — Z23 Encounter for immunization: Secondary | ICD-10-CM | POA: Diagnosis not present

## 2023-11-10 DIAGNOSIS — Z992 Dependence on renal dialysis: Secondary | ICD-10-CM | POA: Diagnosis not present

## 2023-11-10 DIAGNOSIS — N2581 Secondary hyperparathyroidism of renal origin: Secondary | ICD-10-CM | POA: Diagnosis not present

## 2023-11-10 DIAGNOSIS — K769 Liver disease, unspecified: Secondary | ICD-10-CM | POA: Diagnosis not present

## 2023-11-10 DIAGNOSIS — D631 Anemia in chronic kidney disease: Secondary | ICD-10-CM | POA: Diagnosis not present

## 2023-11-11 DIAGNOSIS — D509 Iron deficiency anemia, unspecified: Secondary | ICD-10-CM | POA: Diagnosis not present

## 2023-11-11 DIAGNOSIS — Z23 Encounter for immunization: Secondary | ICD-10-CM | POA: Diagnosis not present

## 2023-11-11 DIAGNOSIS — R8290 Unspecified abnormal findings in urine: Secondary | ICD-10-CM | POA: Diagnosis not present

## 2023-11-11 DIAGNOSIS — Z992 Dependence on renal dialysis: Secondary | ICD-10-CM | POA: Diagnosis not present

## 2023-11-11 DIAGNOSIS — D631 Anemia in chronic kidney disease: Secondary | ICD-10-CM | POA: Diagnosis not present

## 2023-11-11 DIAGNOSIS — N186 End stage renal disease: Secondary | ICD-10-CM | POA: Diagnosis not present

## 2023-11-11 DIAGNOSIS — K769 Liver disease, unspecified: Secondary | ICD-10-CM | POA: Diagnosis not present

## 2023-11-11 DIAGNOSIS — N2581 Secondary hyperparathyroidism of renal origin: Secondary | ICD-10-CM | POA: Diagnosis not present

## 2023-11-12 DIAGNOSIS — R8290 Unspecified abnormal findings in urine: Secondary | ICD-10-CM | POA: Diagnosis not present

## 2023-11-12 DIAGNOSIS — N2581 Secondary hyperparathyroidism of renal origin: Secondary | ICD-10-CM | POA: Diagnosis not present

## 2023-11-12 DIAGNOSIS — N186 End stage renal disease: Secondary | ICD-10-CM | POA: Diagnosis not present

## 2023-11-12 DIAGNOSIS — D631 Anemia in chronic kidney disease: Secondary | ICD-10-CM | POA: Diagnosis not present

## 2023-11-12 DIAGNOSIS — K769 Liver disease, unspecified: Secondary | ICD-10-CM | POA: Diagnosis not present

## 2023-11-12 DIAGNOSIS — D509 Iron deficiency anemia, unspecified: Secondary | ICD-10-CM | POA: Diagnosis not present

## 2023-11-12 DIAGNOSIS — I129 Hypertensive chronic kidney disease with stage 1 through stage 4 chronic kidney disease, or unspecified chronic kidney disease: Secondary | ICD-10-CM | POA: Diagnosis not present

## 2023-11-12 DIAGNOSIS — Z23 Encounter for immunization: Secondary | ICD-10-CM | POA: Diagnosis not present

## 2023-11-12 DIAGNOSIS — Z992 Dependence on renal dialysis: Secondary | ICD-10-CM | POA: Diagnosis not present

## 2023-12-20 ENCOUNTER — Emergency Department (HOSPITAL_COMMUNITY): Payer: Medicare Other

## 2023-12-20 ENCOUNTER — Inpatient Hospital Stay (HOSPITAL_COMMUNITY)
Admission: EM | Admit: 2023-12-20 | Discharge: 2023-12-27 | DRG: 919 | Disposition: A | Discharge status: Hospice | Payer: Medicare Other | Attending: Internal Medicine | Admitting: Internal Medicine

## 2023-12-20 ENCOUNTER — Inpatient Hospital Stay (HOSPITAL_COMMUNITY): Payer: Medicare Other

## 2023-12-20 ENCOUNTER — Other Ambulatory Visit: Payer: Self-pay

## 2023-12-20 ENCOUNTER — Encounter (HOSPITAL_COMMUNITY): Payer: Self-pay | Admitting: Nephrology

## 2023-12-20 DIAGNOSIS — I4891 Unspecified atrial fibrillation: Secondary | ICD-10-CM

## 2023-12-20 DIAGNOSIS — I482 Chronic atrial fibrillation, unspecified: Secondary | ICD-10-CM

## 2023-12-20 DIAGNOSIS — I120 Hypertensive chronic kidney disease with stage 5 chronic kidney disease or end stage renal disease: Secondary | ICD-10-CM | POA: Diagnosis present

## 2023-12-20 DIAGNOSIS — E43 Unspecified severe protein-calorie malnutrition: Secondary | ICD-10-CM | POA: Insufficient documentation

## 2023-12-20 DIAGNOSIS — D631 Anemia in chronic kidney disease: Secondary | ICD-10-CM | POA: Diagnosis present

## 2023-12-20 DIAGNOSIS — I7143 Infrarenal abdominal aortic aneurysm, without rupture: Secondary | ICD-10-CM | POA: Diagnosis present

## 2023-12-20 DIAGNOSIS — Z515 Encounter for palliative care: Secondary | ICD-10-CM | POA: Diagnosis not present

## 2023-12-20 DIAGNOSIS — K4090 Unilateral inguinal hernia, without obstruction or gangrene, not specified as recurrent: Secondary | ICD-10-CM | POA: Diagnosis present

## 2023-12-20 DIAGNOSIS — Y841 Kidney dialysis as the cause of abnormal reaction of the patient, or of later complication, without mention of misadventure at the time of the procedure: Secondary | ICD-10-CM | POA: Diagnosis present

## 2023-12-20 DIAGNOSIS — D638 Anemia in other chronic diseases classified elsewhere: Secondary | ICD-10-CM | POA: Diagnosis not present

## 2023-12-20 DIAGNOSIS — N2581 Secondary hyperparathyroidism of renal origin: Secondary | ICD-10-CM | POA: Diagnosis present

## 2023-12-20 DIAGNOSIS — Z1611 Resistance to penicillins: Secondary | ICD-10-CM | POA: Diagnosis present

## 2023-12-20 DIAGNOSIS — R54 Age-related physical debility: Secondary | ICD-10-CM | POA: Diagnosis present

## 2023-12-20 DIAGNOSIS — I951 Orthostatic hypotension: Secondary | ICD-10-CM | POA: Diagnosis present

## 2023-12-20 DIAGNOSIS — K219 Gastro-esophageal reflux disease without esophagitis: Secondary | ICD-10-CM | POA: Diagnosis present

## 2023-12-20 DIAGNOSIS — K7460 Unspecified cirrhosis of liver: Secondary | ICD-10-CM | POA: Diagnosis present

## 2023-12-20 DIAGNOSIS — I10 Essential (primary) hypertension: Secondary | ICD-10-CM | POA: Diagnosis present

## 2023-12-20 DIAGNOSIS — I700 Atherosclerosis of aorta: Secondary | ICD-10-CM | POA: Diagnosis present

## 2023-12-20 DIAGNOSIS — G8929 Other chronic pain: Secondary | ICD-10-CM | POA: Diagnosis present

## 2023-12-20 DIAGNOSIS — K650 Generalized (acute) peritonitis: Secondary | ICD-10-CM | POA: Diagnosis present

## 2023-12-20 DIAGNOSIS — R6520 Severe sepsis without septic shock: Principal | ICD-10-CM

## 2023-12-20 DIAGNOSIS — N189 Chronic kidney disease, unspecified: Secondary | ICD-10-CM | POA: Diagnosis present

## 2023-12-20 DIAGNOSIS — E871 Hypo-osmolality and hyponatremia: Secondary | ICD-10-CM | POA: Diagnosis present

## 2023-12-20 DIAGNOSIS — I2510 Atherosclerotic heart disease of native coronary artery without angina pectoris: Secondary | ICD-10-CM | POA: Diagnosis present

## 2023-12-20 DIAGNOSIS — I739 Peripheral vascular disease, unspecified: Secondary | ICD-10-CM | POA: Diagnosis not present

## 2023-12-20 DIAGNOSIS — Z66 Do not resuscitate: Secondary | ICD-10-CM | POA: Diagnosis present

## 2023-12-20 DIAGNOSIS — A419 Sepsis, unspecified organism: Principal | ICD-10-CM | POA: Insufficient documentation

## 2023-12-20 DIAGNOSIS — Z833 Family history of diabetes mellitus: Secondary | ICD-10-CM

## 2023-12-20 DIAGNOSIS — Z681 Body mass index (BMI) 19 or less, adult: Secondary | ICD-10-CM | POA: Diagnosis not present

## 2023-12-20 DIAGNOSIS — Z79899 Other long term (current) drug therapy: Secondary | ICD-10-CM

## 2023-12-20 DIAGNOSIS — Z880 Allergy status to penicillin: Secondary | ICD-10-CM

## 2023-12-20 DIAGNOSIS — A4159 Other Gram-negative sepsis: Secondary | ICD-10-CM | POA: Diagnosis present

## 2023-12-20 DIAGNOSIS — Z1152 Encounter for screening for COVID-19: Secondary | ICD-10-CM

## 2023-12-20 DIAGNOSIS — T8571XD Infection and inflammatory reaction due to peritoneal dialysis catheter, subsequent encounter: Secondary | ICD-10-CM | POA: Diagnosis not present

## 2023-12-20 DIAGNOSIS — I714 Abdominal aortic aneurysm, without rupture, unspecified: Secondary | ICD-10-CM

## 2023-12-20 DIAGNOSIS — I719 Aortic aneurysm of unspecified site, without rupture: Secondary | ICD-10-CM

## 2023-12-20 DIAGNOSIS — Z885 Allergy status to narcotic agent status: Secondary | ICD-10-CM

## 2023-12-20 DIAGNOSIS — R627 Adult failure to thrive: Secondary | ICD-10-CM

## 2023-12-20 DIAGNOSIS — N186 End stage renal disease: Secondary | ICD-10-CM

## 2023-12-20 DIAGNOSIS — Z7901 Long term (current) use of anticoagulants: Secondary | ICD-10-CM

## 2023-12-20 DIAGNOSIS — E872 Acidosis, unspecified: Secondary | ICD-10-CM | POA: Diagnosis present

## 2023-12-20 DIAGNOSIS — R6521 Severe sepsis with septic shock: Secondary | ICD-10-CM | POA: Diagnosis present

## 2023-12-20 DIAGNOSIS — H9190 Unspecified hearing loss, unspecified ear: Secondary | ICD-10-CM | POA: Diagnosis present

## 2023-12-20 DIAGNOSIS — Z992 Dependence on renal dialysis: Secondary | ICD-10-CM

## 2023-12-20 DIAGNOSIS — M549 Dorsalgia, unspecified: Secondary | ICD-10-CM | POA: Diagnosis present

## 2023-12-20 DIAGNOSIS — Z7189 Other specified counseling: Secondary | ICD-10-CM | POA: Diagnosis not present

## 2023-12-20 DIAGNOSIS — T8571XA Infection and inflammatory reaction due to peritoneal dialysis catheter, initial encounter: Secondary | ICD-10-CM | POA: Diagnosis present

## 2023-12-20 DIAGNOSIS — M109 Gout, unspecified: Secondary | ICD-10-CM | POA: Diagnosis present

## 2023-12-20 DIAGNOSIS — Z8249 Family history of ischemic heart disease and other diseases of the circulatory system: Secondary | ICD-10-CM

## 2023-12-20 DIAGNOSIS — R1310 Dysphagia, unspecified: Secondary | ICD-10-CM | POA: Diagnosis present

## 2023-12-20 HISTORY — DX: End stage renal disease: N18.6

## 2023-12-20 LAB — COMPREHENSIVE METABOLIC PANEL
ALT: 24 U/L (ref 0–44)
AST: 23 U/L (ref 15–41)
Albumin: 2.2 g/dL — ABNORMAL LOW (ref 3.5–5.0)
Alkaline Phosphatase: 70 U/L (ref 38–126)
Anion gap: 15 (ref 5–15)
BUN: 34 mg/dL — ABNORMAL HIGH (ref 8–23)
CO2: 25 mmol/L (ref 22–32)
Calcium: 8.9 mg/dL (ref 8.9–10.3)
Chloride: 94 mmol/L — ABNORMAL LOW (ref 98–111)
Creatinine, Ser: 6.98 mg/dL — ABNORMAL HIGH (ref 0.61–1.24)
GFR, Estimated: 7 mL/min — ABNORMAL LOW (ref >60)
Glucose, Bld: 111 mg/dL — ABNORMAL HIGH (ref 70–99)
Potassium: 4 mmol/L (ref 3.5–5.1)
Sodium: 134 mmol/L — ABNORMAL LOW (ref 135–145)
Total Bilirubin: 1 mg/dL (ref 0.0–1.2)
Total Protein: 5.4 g/dL — ABNORMAL LOW (ref 6.5–8.1)

## 2023-12-20 LAB — CBC WITH DIFFERENTIAL/PLATELET
Abs Immature Granulocytes: 0 10*3/uL (ref 0.00–0.07)
Basophils Absolute: 0 10*3/uL (ref 0.0–0.1)
Basophils Relative: 1 %
Eosinophils Absolute: 0 10*3/uL (ref 0.0–0.5)
Eosinophils Relative: 1 %
HCT: 36.3 % — ABNORMAL LOW (ref 39.0–52.0)
Hemoglobin: 11.7 g/dL — ABNORMAL LOW (ref 13.0–17.0)
Immature Granulocytes: 0 %
Lymphocytes Relative: 37 %
Lymphs Abs: 0.7 10*3/uL (ref 0.7–4.0)
MCH: 32.3 pg (ref 26.0–34.0)
MCHC: 32.2 g/dL (ref 30.0–36.0)
MCV: 100.3 fL — ABNORMAL HIGH (ref 80.0–100.0)
Monocytes Absolute: 0.2 10*3/uL (ref 0.1–1.0)
Monocytes Relative: 8 %
Neutro Abs: 1 10*3/uL — ABNORMAL LOW (ref 1.7–7.7)
Neutrophils Relative %: 53 %
Platelets: 233 10*3/uL (ref 150–400)
RBC: 3.62 MIL/uL — ABNORMAL LOW (ref 4.22–5.81)
RDW: 16.2 % — ABNORMAL HIGH (ref 11.5–15.5)
WBC Morphology: INCREASED
WBC: 1.9 10*3/uL — ABNORMAL LOW (ref 4.0–10.5)
nRBC: 0 % (ref 0.0–0.2)

## 2023-12-20 LAB — RESP PANEL BY RT-PCR (RSV, FLU A&B, COVID)  RVPGX2
Influenza A by PCR: NEGATIVE
Influenza B by PCR: NEGATIVE
Resp Syncytial Virus by PCR: NEGATIVE
SARS Coronavirus 2 by RT PCR: NEGATIVE

## 2023-12-20 LAB — I-STAT CG4 LACTIC ACID, ED
Lactic Acid, Venous: 2.8 mmol/L (ref 0.5–1.9)
Lactic Acid, Venous: 3.7 mmol/L (ref 0.5–1.9)

## 2023-12-20 LAB — PROTIME-INR
INR: 1.6 — ABNORMAL HIGH (ref 0.8–1.2)
Prothrombin Time: 18.8 s — ABNORMAL HIGH (ref 11.4–15.2)

## 2023-12-20 LAB — TROPONIN I (HIGH SENSITIVITY)
Troponin I (High Sensitivity): 15 ng/L (ref <18)
Troponin I (High Sensitivity): 14 ng/L (ref <18)

## 2023-12-20 LAB — BODY FLUID CELL COUNT WITH DIFFERENTIAL
Eos, Fluid: 1 %
Lymphs, Fluid: 5 %
Monocyte-Macrophage-Serous Fluid: 9 % — ABNORMAL LOW (ref 50–90)
Neutrophil Count, Fluid: 85 % — ABNORMAL HIGH (ref 0–25)
Total Nucleated Cell Count, Fluid: 645 uL (ref 0–1000)

## 2023-12-20 LAB — PROCALCITONIN: Procalcitonin: 20.83 ng/mL

## 2023-12-20 LAB — APTT: aPTT: 38 s — ABNORMAL HIGH (ref 24–36)

## 2023-12-20 LAB — MRSA NEXT GEN BY PCR, NASAL: MRSA by PCR Next Gen: NOT DETECTED

## 2023-12-20 LAB — GLUCOSE, CAPILLARY: Glucose-Capillary: 81 mg/dL (ref 70–99)

## 2023-12-20 LAB — TSH: TSH: 4.065 u[IU]/mL (ref 0.350–4.500)

## 2023-12-20 MED ORDER — AMIODARONE LOAD VIA INFUSION
150.0000 mg | Freq: Once | INTRAVENOUS | Status: AC
  Administered 2023-12-20: 150 mg via INTRAVENOUS
  Filled 2023-12-20: qty 83.34

## 2023-12-20 MED ORDER — NOREPINEPHRINE 4 MG/250ML-% IV SOLN
2.0000 ug/min | INTRAVENOUS | Status: DC
  Administered 2023-12-20: 8 ug/min via INTRAVENOUS
  Administered 2023-12-20: 2 ug/min via INTRAVENOUS
  Administered 2023-12-21: 8 ug/min via INTRAVENOUS
  Administered 2023-12-21: 4 ug/min via INTRAVENOUS
  Administered 2023-12-22: 5 ug/min via INTRAVENOUS
  Filled 2023-12-20 (×4): qty 250

## 2023-12-20 MED ORDER — HYDROMORPHONE HCL 1 MG/ML IJ SOLN
0.5000 mg | INTRAMUSCULAR | Status: DC | PRN
  Administered 2023-12-22 – 2023-12-27 (×4): 0.5 mg via INTRAVENOUS
  Filled 2023-12-20 (×4): qty 0.5

## 2023-12-20 MED ORDER — FENTANYL CITRATE PF 50 MCG/ML IJ SOSY
50.0000 ug | PREFILLED_SYRINGE | Freq: Once | INTRAMUSCULAR | Status: AC
  Administered 2023-12-20: 50 ug via INTRAVENOUS
  Filled 2023-12-20: qty 1

## 2023-12-20 MED ORDER — VANCOMYCIN VARIABLE DOSE PER UNSTABLE RENAL FUNCTION (PHARMACIST DOSING): Status: DC

## 2023-12-20 MED ORDER — DELFLEX-LC/1.5% DEXTROSE 344 MOSM/L IP SOLN: INTRAPERITONEAL | Status: DC

## 2023-12-20 MED ORDER — ONDANSETRON HCL 4 MG/2ML IJ SOLN
4.0000 mg | Freq: Four times a day (QID) | INTRAMUSCULAR | Status: DC | PRN
Start: 2023-12-20 — End: 2023-12-28
  Administered 2023-12-21 – 2023-12-23 (×5): 4 mg via INTRAVENOUS
  Filled 2023-12-20 (×5): qty 2

## 2023-12-20 MED ORDER — HYDROCODONE-ACETAMINOPHEN 5-325 MG PO TABS
1.0000 | ORAL_TABLET | ORAL | Status: DC | PRN
  Administered 2023-12-21 – 2023-12-22 (×4): 1 via ORAL
  Filled 2023-12-20 (×2): qty 1
  Filled 2023-12-20: qty 2
  Filled 2023-12-20: qty 1

## 2023-12-20 MED ORDER — SODIUM CHLORIDE 0.9 % IV BOLUS
500.0000 mL | Freq: Once | INTRAVENOUS | Status: AC
  Administered 2023-12-20: 500 mL via INTRAVENOUS

## 2023-12-20 MED ORDER — PIPERACILLIN-TAZOBACTAM IN DEX 2-0.25 GM/50ML IV SOLN
2.2500 g | Freq: Three times a day (TID) | INTRAVENOUS | Status: DC
  Administered 2023-12-21 – 2023-12-22 (×4): 2.25 g via INTRAVENOUS
  Filled 2023-12-20 (×6): qty 50

## 2023-12-20 MED ORDER — NOREPINEPHRINE 4 MG/250ML-% IV SOLN
INTRAVENOUS | Status: AC
  Administered 2023-12-20: 2 ug/min via INTRAVENOUS
  Filled 2023-12-20: qty 250

## 2023-12-20 MED ORDER — HEPARIN SODIUM (PORCINE) 1000 UNIT/ML IJ SOLN
INTRAPERITONEAL | Status: DC | PRN
  Filled 2023-12-20 (×4): qty 6000

## 2023-12-20 MED ORDER — AMIODARONE HCL IN DEXTROSE 360-4.14 MG/200ML-% IV SOLN
60.0000 mg/h | INTRAVENOUS | Status: DC
  Administered 2023-12-20 (×2): 60 mg/h via INTRAVENOUS
  Filled 2023-12-20: qty 200

## 2023-12-20 MED ORDER — SODIUM CHLORIDE 0.9% FLUSH: 10.0000 mL | INTRAVENOUS | Status: DC | PRN

## 2023-12-20 MED ORDER — METRONIDAZOLE 500 MG/100ML IV SOLN
500.0000 mg | Freq: Once | INTRAVENOUS | Status: AC
  Administered 2023-12-20: 500 mg via INTRAVENOUS
  Filled 2023-12-20: qty 100

## 2023-12-20 MED ORDER — DILTIAZEM HCL-DEXTROSE 125-5 MG/125ML-% IV SOLN (PREMIX)
5.0000 mg/h | INTRAVENOUS | Status: DC
  Administered 2023-12-20: 5 mg/h via INTRAVENOUS
  Filled 2023-12-20: qty 125

## 2023-12-20 MED ORDER — SODIUM CHLORIDE 0.9 % IV SOLN: 1.0000 g | INTRAVENOUS | Status: DC

## 2023-12-20 MED ORDER — PANTOPRAZOLE SODIUM 40 MG PO TBEC
80.0000 mg | DELAYED_RELEASE_TABLET | Freq: Every day | ORAL | Status: DC
  Administered 2023-12-20 – 2023-12-26 (×7): 80 mg via ORAL
  Filled 2023-12-20 (×7): qty 2

## 2023-12-20 MED ORDER — RENA-VITE PO TABS
1.0000 | ORAL_TABLET | Freq: Every day | ORAL | Status: DC
  Administered 2023-12-21 – 2023-12-26 (×6): 1 via ORAL
  Filled 2023-12-20 (×7): qty 1

## 2023-12-20 MED ORDER — AMIODARONE HCL IN DEXTROSE 360-4.14 MG/200ML-% IV SOLN
30.0000 mg/h | INTRAVENOUS | Status: DC
  Administered 2023-12-21 – 2023-12-23 (×5): 30 mg/h via INTRAVENOUS
  Filled 2023-12-20 (×6): qty 200

## 2023-12-20 MED ORDER — HYDROMORPHONE HCL 1 MG/ML IJ SOLN: 0.5000 mg | INTRAMUSCULAR | Status: DC | PRN

## 2023-12-20 MED ORDER — SODIUM CHLORIDE 0.9 % IV SOLN: 250.0000 mL | INTRAVENOUS | Status: AC

## 2023-12-20 MED ORDER — CHLORHEXIDINE GLUCONATE CLOTH 2 % EX PADS
6.0000 | MEDICATED_PAD | Freq: Every day | CUTANEOUS | Status: DC
  Administered 2023-12-20 – 2023-12-25 (×6): 6 via TOPICAL

## 2023-12-20 MED ORDER — POTASSIUM CHLORIDE CRYS ER 10 MEQ PO TBCR
20.0000 meq | EXTENDED_RELEASE_TABLET | Freq: Every day | ORAL | Status: DC
  Administered 2023-12-21 – 2023-12-26 (×6): 20 meq via ORAL
  Filled 2023-12-20 (×7): qty 2

## 2023-12-20 MED ORDER — HEPARIN SODIUM (PORCINE) 1000 UNIT/ML IJ SOLN
3000.0000 [IU] | Freq: Once | INTRAMUSCULAR | Status: AC
  Administered 2023-12-21: 2200 [IU] via INTRAVENOUS

## 2023-12-20 MED ORDER — ONDANSETRON HCL 4 MG PO TABS
4.0000 mg | ORAL_TABLET | Freq: Four times a day (QID) | ORAL | Status: DC | PRN
  Administered 2023-12-23: 4 mg via ORAL
  Filled 2023-12-20: qty 1

## 2023-12-20 MED ORDER — SODIUM CHLORIDE 0.9 % IV SOLN
2.0000 g | Freq: Once | INTRAVENOUS | Status: AC
  Administered 2023-12-20: 2 g via INTRAVENOUS
  Filled 2023-12-20: qty 12.5

## 2023-12-20 MED ORDER — CALCITRIOL 0.25 MCG PO CAPS
0.2500 ug | ORAL_CAPSULE | ORAL | Status: DC
  Administered 2023-12-24: 0.25 ug via ORAL
  Filled 2023-12-20 (×2): qty 1

## 2023-12-20 MED ORDER — ACETAMINOPHEN 325 MG PO TABS
650.0000 mg | ORAL_TABLET | Freq: Four times a day (QID) | ORAL | Status: DC | PRN
  Administered 2023-12-23: 325 mg via ORAL
  Administered 2023-12-24 – 2023-12-27 (×7): 650 mg via ORAL
  Filled 2023-12-20 (×8): qty 2

## 2023-12-20 MED ORDER — VANCOMYCIN HCL IN DEXTROSE 1-5 GM/200ML-% IV SOLN
1000.0000 mg | Freq: Once | INTRAVENOUS | Status: AC
  Administered 2023-12-20: 1000 mg via INTRAVENOUS
  Filled 2023-12-20: qty 200

## 2023-12-20 MED ORDER — FENTANYL CITRATE PF 50 MCG/ML IJ SOSY
25.0000 ug | PREFILLED_SYRINGE | Freq: Once | INTRAMUSCULAR | Status: AC
  Administered 2023-12-20: 25 ug via INTRAVENOUS
  Filled 2023-12-20: qty 1

## 2023-12-20 MED ORDER — SODIUM CHLORIDE 0.9 % IV BOLUS: 500.0000 mL | Freq: Once | INTRAVENOUS | Status: DC

## 2023-12-20 MED ORDER — ALLOPURINOL 100 MG PO TABS
100.0000 mg | ORAL_TABLET | ORAL | Status: DC
Start: 2023-12-20 — End: 2023-12-26
  Administered 2023-12-23 – 2023-12-25 (×2): 100 mg via ORAL
  Filled 2023-12-20 (×4): qty 1

## 2023-12-20 MED ORDER — CARVEDILOL 3.125 MG PO TABS
3.1250 mg | ORAL_TABLET | Freq: Two times a day (BID) | ORAL | Status: DC
  Administered 2023-12-20: 3.125 mg via ORAL
  Filled 2023-12-20: qty 1

## 2023-12-20 MED ORDER — APIXABAN 2.5 MG PO TABS
2.5000 mg | ORAL_TABLET | Freq: Two times a day (BID) | ORAL | Status: DC
Start: 2023-12-20 — End: 2023-12-26
  Administered 2023-12-20 – 2023-12-26 (×12): 2.5 mg via ORAL
  Filled 2023-12-20 (×13): qty 1

## 2023-12-20 MED ORDER — FERRIC CITRATE 1 GM 210 MG(FE) PO TABS
210.0000 mg | ORAL_TABLET | Freq: Three times a day (TID) | ORAL | Status: DC
  Administered 2023-12-21 – 2023-12-26 (×8): 210 mg via ORAL
  Filled 2023-12-20 (×18): qty 1

## 2023-12-20 MED ORDER — GENTAMICIN SULFATE 0.1 % EX CREA
1.0000 | TOPICAL_CREAM | Freq: Every day | CUTANEOUS | Status: DC
  Administered 2023-12-22 – 2023-12-23 (×3): 1 via TOPICAL
  Filled 2023-12-20 (×2): qty 15

## 2023-12-20 MED ORDER — IOHEXOL 350 MG/ML SOLN
75.0000 mL | Freq: Once | INTRAVENOUS | Status: AC | PRN
  Administered 2023-12-20: 75 mL via INTRAVENOUS

## 2023-12-20 MED ORDER — SODIUM CHLORIDE 0.9% FLUSH
10.0000 mL | Freq: Two times a day (BID) | INTRAVENOUS | Status: DC
  Administered 2023-12-20 – 2023-12-26 (×8): 10 mL

## 2023-12-20 MED ORDER — ONDANSETRON HCL 4 MG/2ML IJ SOLN
4.0000 mg | Freq: Once | INTRAMUSCULAR | Status: AC | PRN
  Administered 2023-12-20: 4 mg via INTRAVENOUS
  Filled 2023-12-20: qty 2

## 2023-12-20 MED ORDER — ACETAMINOPHEN 650 MG RE SUPP: 650.0000 mg | Freq: Four times a day (QID) | RECTAL | Status: DC | PRN

## 2023-12-20 NOTE — Assessment & Plan Note (Signed)
 Measures 3.1cm infrarenal abdominal aortic aneurysm  F/u US  q 3 years

## 2023-12-20 NOTE — Consult Note (Signed)
 Shaun Service Consult Note [facility]  ADONI GREENOUGH 12/20/2023 Lamar JONETTA Fret, Shaun Requesting Physician: Dr. Freddi  Reason for Consult: ESRD pt on PD w/ abd pain, N/V HPI: The patient is a 89 y.o. Murillo w/ PMH as below who presented to ED w/ hx of abd pain, N/V. Also gen'd weakness more long-term. Can't take a deep breath due to abd pain. Pt will be admitted. CT abd neg, WBC 1.9, Hb 11.7. K 4, creat 6.9. BP's were 80s on arrival, better now after 500cc NS bolus. Pt is getting IV abx and will be admitted, question possible peritonitis. We are asked to see for ESRD and fluid collection.   They drain PD fluid into the shower so cannot comment on clearness or cloudiness of the effluent fluids. No drainage for exit site. No fevers noted. Secondary issues have been no appetite, fatigue and weakness overall has been progressive for about 1 year.    Started PD in July 2021 after PD cath placed by Dr Cara.  01/2023 - RSV infection w/ fall, [facility] admit 02/2023 - still weak and tired 06/2023 - recent dx afib, switched norvasc  to coreg  for rate control 08/2023 - still weak, denied pt referral 09/2023 - balance issues 10/2023 - borderline clearance w/ PD, feels very poorly on 6 exchanges, short dwells of 1 hr 11/2023 - chronic back pain and progressive weakness, declining, will cont PD although not meeting kt/v for clearance, discussed iHD pt declined and discussed palliative/ hospice care. Pt said when he fails PD he will do hospice.    ROS - denies CP, no joint pain, no HA, no blurry vision, no rash   Past Medical History  Past Medical History:  Diagnosis Date   Arthritis    Chronic kidney disease (CKD), stage IV (severe) (HCC)    Complication of anesthesia    Hard of hearing    Hypertension    Kidney stones    PONV (postoperative nausea and vomiting)    Past Surgical History  Past Surgical History:  Procedure Laterality Date   BACK SURGERY     COLONOSCOPY      ESOPHAGOGASTRODUODENOSCOPY     EYE SURGERY     cataracts bilateral   PAROTIDECTOMY Right 07/18/2016   Procedure: RIGHT SUPERFICIAL PAROTIDECTOMY WITH NERVE DISSECTION;  Surgeon: Vaughan Ricker, Shaun;  Location: [facility] OR;  Service: ENT;  Laterality: Right;  RIGHT PAROTIDECTOMY WITH SELECTIVE NECK DISSECTION   SHOULDER SURGERY Right    Family History No family history on file. Social History  reports that he has never smoked. He has never used smokeless tobacco. He reports that he does not drink alcohol  and does not use drugs. Allergies  Allergies  Allergen Reactions   Codeine Nausea And Vomiting   Penicillins Nausea And Vomiting    Has patient had a PCN reaction causing immediate rash, facial/tongue/throat swelling, SOB or lightheadedness with hypotension: no Has patient had a PCN reaction causing severe rash involving mucus membranes or skin necrosis:no Has patient had a PCN reaction that required hospitalization no Has patient had a PCN reaction occurring within the last 10 years: no If all of the above answers are NO, then may proceed with Cephalosporin use.   Home medications Prior to Admission medications   Medication Sig Start Date End Date Taking? Authorizing Provider  allopurinol  (ZYLOPRIM ) 100 MG tablet Take 100 mg by mouth every Monday, Wednesday, and Friday. Takes in the evening 05/23/20   Provider, Historical, Shaun  amLODipine  (NORVASC ) 5 MG  tablet Take 5 mg by mouth at bedtime. Patient not taking: Reported on 06/21/2023 06/01/16   Provider, Historical, Shaun  apixaban  (ELIQUIS ) 2.5 MG TABS tablet Take 1 tablet (2.5 mg total) by mouth 2 (two) times daily. 10/14/23   Alvan Ronal BRAVO, Shaun  AURYXIA  1 GM 210 MG(Fe) tablet Take 210 mg by mouth 2 (two) times daily with a meal. 12/19/22   Provider, Historical, Shaun  calcitRIOL  (ROCALTROL ) 0.25 MCG capsule Take 0.25 mcg by mouth every Tuesday, Thursday, and Saturday at 6 PM. In the evening 04/15/20   Provider, Historical, Shaun  ferrous sulfate  325 (65 FE) MG  tablet Take 325 mg by mouth in the morning. 06/07/20   Provider, Historical, Shaun  furosemide  (LASIX ) 40 MG tablet Take 80 mg by mouth every morning. 05/01/20   Provider, Historical, Shaun  hydrALAZINE  (APRESOLINE ) 25 MG tablet Take 25 mg by mouth in the morning and at bedtime. 10/19/22   Provider, Historical, Shaun  omeprazole (PRILOSEC) 40 MG capsule Take 40 mg by mouth every morning. 06/01/16   Provider, Historical, Shaun     Vitals:   12/20/23 0656  BP: (!) 82/55  Pulse: 73  Resp: 18  SpO2: (!) 89%   Exam Gen alert, no distress, uncomfortable in appearance No rash, cyanosis or gangrene Sclera anicteric, throat clear  No jvd or bruits Chest clear bilat to bases, no rales/ wheezing RRR no RG Abd diffusely tender abdomen, no mass or ascites +bs GU defer MS no joint effusions or deformity Ext no LE or UE edema, no other edema Neuro is alert, Ox 3 , nf     PD cath mid R abdomen - exit site is clean   Shaun-related home meds: - norvasc  5 hs - auryxia  210 ac tid - rocaltrol  0.25 mcg po tts - lasix  80 qam - others: PPI, eliquis , zyloprim , prns   OP PD: nightly CCPD Dr Dolan  60.5kg   1h dwells x 6 overnight, fill vol 2.3 L - last HB  10.4 on 2/3, last mircera sq 75 mcg on 1/23    Assessment/ Plan: Abd pain/ N/V - in pt w/ ESRD on peritoneal dialysis since 2021. No prior peritonitis. Abdomen is tender, suspect this may be PD-cath related peritonitis. Agree w/ broad-spec IV abx. Will have PD fluid drained (or filled then drained) and then sent for cell ct and culture around noon. Will follow.  ESRD - on CCPD nightly w/ 1 hr exchanges x 6. Cont here.  BP/ volume - bp 80s on arrival, better at 110s after NS bolus in ED. Okay to give another bolus or two if needed. No signs of vol excess on exam. Holding home bp lowering meds.  Anemia of esrd - Hb 11- 12 here, last esa on 1/23. No esa needs.  Secondary hyperparathyroidism - CCa in range, add on phos. Cont binder w/ meals and po vdra on tts  schedule.  GOC - per dtr-in-law this am, pt is DNR. Pt failing PD slowly but surely w/ progressive fatigue, anorexia in OP setting. Pt refuses HD and has stated that when the PD fails he will go onto hospice.       Myer Fret  Shaun CKA 12/20/2023, 9:35 AM  Recent Labs  Lab 12/20/23 0656  HGB 11.7*  ALBUMIN  2.2*  CALCIUM 8.9  CREATININE 6.98*  K 4.0   Inpatient medications:   metronidazole      vancomycin 

## 2023-12-20 NOTE — ED Triage Notes (Signed)
 Patient fell 2 days ago at home , denies injury , reports emesis with pain across abdomen this week . Peritoneal dialysis treatment every night . Denies fever or chills.

## 2023-12-20 NOTE — ED Provider Notes (Signed)
 [place] EMERGENCY DEPARTMENT AT [facility] Provider Note   CSN: [id number] Arrival date & time: 12/20/23  0645     History  Chief Complaint  Patient presents with   Fall / Abdominal Pain     Shaun Murillo is a 89 y.o. male.  HPI 89 year old male presents with generalized weakness, abdominal pain, and vomiting.  The patient provides some history but most of the history is from his daughter-in-law who helps take care of him.  The patient has been on peritoneal dialysis for the last few years.  He in general has had decreased p.o. intake that has been progressive but particularly bad over the last week or so.  He has been getting weaker and a generalized sense.  He slipped off a stool while trying to go to the bathroom but did not suffer any injuries 2 days ago.  However yesterday started complaining of abdominal pain and some vomiting very small amounts of mucus.  Family was unsure if he may be injured his abdomen.  He denies any head injury, headache, chest pain, shortness of breath.  He does state that it hurts his abdomen for him to breathe in.  He has been continued to do his peritoneal dialysis and feels better when he takes fluid off.  He is not sure what color his dialysate has been.  He urinates very little at baseline.  No reported fevers or cough.  Home Medications Prior to Admission medications   Medication Sig Start Date End Date Taking? Authorizing Provider  allopurinol  (ZYLOPRIM ) 100 MG tablet Take 100 mg by mouth every Monday, Wednesday, and Friday. Takes in the evening 05/23/20  Yes [provider]  apixaban  (ELIQUIS ) 2.5 MG TABS tablet Take 1 tablet (2.5 mg total) by mouth 2 (two) times daily. 10/14/23  Yes Alvan Ronal BRAVO, MD  AURYXIA  1 GM 210 MG(Fe) tablet Take 210 mg by mouth 2 (two) times daily with a meal. 12/19/22  Yes [provider]  B Complex-C-Folic Acid  (DIALYVITE TABLET) TABS Take 1 tablet by mouth daily. 12/18/23  Yes [provider]  calcitRIOL  (ROCALTROL ) 0.25 MCG capsule Take 0.25 mcg by mouth every Tuesday, Thursday, and Saturday at 6 PM. In the evening 04/15/20  Yes [provider]  carvedilol  (COREG ) 3.125 MG tablet Take 3.125 mg by mouth 2 (two) times daily. 09/02/23  Yes [provider]  furosemide  (LASIX ) 40 MG tablet Take 80 mg by mouth every morning. 05/01/20  Yes [provider]  omeprazole (PRILOSEC) 40 MG capsule Take 40 mg by mouth every morning. 06/01/16  Yes [provider]  Potassium Chloride  ER 20 MEQ TBCR Take 1 tablet by mouth daily. 10/08/23  Yes [provider]      Allergies    Codeine and Penicillins    Review of Systems   Review of Systems  Constitutional:  Negative for fever.  Respiratory:  Negative for cough and shortness of breath.   Cardiovascular:  Negative for chest pain.  Gastrointestinal:  Positive for abdominal pain, nausea and vomiting.  Neurological:  Positive for weakness. Negative for headaches.    Physical Exam Updated Vital Signs BP 112/81   Pulse (!) 142   Temp (!) 97 F (36.1 C) (Axillary)   Resp (!) 22   SpO2 97%  Physical Exam Vitals and nursing note reviewed.  Constitutional:      Appearance: He is well-developed. He is not diaphoretic.  HENT:     Head: Normocephalic and atraumatic.  Cardiovascular:     Rate and Rhythm: Tachycardia present. Rhythm irregular.     Heart sounds: Normal heart sounds.  Pulmonary:     Effort: Pulmonary effort is normal.     Breath sounds: Normal breath sounds.  Abdominal:     Palpations: Abdomen is soft.     Tenderness: There is abdominal tenderness.  Skin:    General: Skin is warm and dry.  Neurological:     Mental Status: He is alert.     Comments: 5/5 strength in all 4 extremities. Hard of hearing, but answers all questions appropriately     ED Results / Procedures / Treatments   Labs (all labs ordered are listed, but only abnormal results are displayed) Labs Reviewed  CBC  WITH DIFFERENTIAL/PLATELET - Abnormal; Notable for the following components:      Result Value   WBC 1.9 (*)    RBC 3.62 (*)    Hemoglobin 11.7 (*)    HCT 36.3 (*)    MCV 100.3 (*)    RDW 16.2 (*)    Neutro Abs 1.0 (*)    All other components within normal limits  COMPREHENSIVE METABOLIC PANEL - Abnormal; Notable for the following components:   Sodium 134 (*)    Chloride 94 (*)    Glucose, Bld 111 (*)    BUN 34 (*)    Creatinine, Ser 6.98 (*)    Total Protein 5.4 (*)    Albumin  2.2 (*)    GFR, Estimated 7 (*)    All other components within normal limits  PROTIME-INR - Abnormal; Notable for the following components:   Prothrombin Time 18.8 (*)    INR 1.6 (*)    All other components within normal limits  APTT - Abnormal; Notable for the following components:   aPTT 38 (*)    All other components within normal limits  I-STAT CG4 LACTIC ACID, ED - Abnormal; Notable for the following components:   Lactic Acid, Venous 2.8 (*)    All other components within normal limits  I-STAT CG4 LACTIC ACID, ED - Abnormal; Notable for the following components:   Lactic Acid, Venous 3.7 (*)    All other components within normal limits  RESP PANEL BY RT-PCR (RSV, FLU A&B, COVID)  RVPGX2  CULTURE, BLOOD (ROUTINE X 2)  CULTURE, BLOOD (ROUTINE X 2)  BODY FLUID CULTURE W GRAM STAIN  URINALYSIS, W/ REFLEX TO CULTURE (INFECTION SUSPECTED)  BODY FLUID CELL COUNT WITH DIFFERENTIAL  CBG MONITORING, ED  TROPONIN I (HIGH SENSITIVITY)  TROPONIN I (HIGH SENSITIVITY)    EKG EKG Interpretation Date/Time:  Friday December 20 2023 06:59:43 EST Ventricular Rate:  136 PR Interval:    QRS Duration:  60 QT Interval:  304 QTC Calculation: 457 R Axis:   18  Text Interpretation: Atrial fibrillation with rapid ventricular response Low voltage QRS Abnormal ECG Confirmed by Freddi Hamilton [phone]) on 12/20/2023 7:16:51 AM  Radiology CT ABDOMEN PELVIS WO CONTRAST Result Date: 12/20/2023 CLINICAL DATA:  Sepsis,  nausea, vomiting for 1 day EXAM: CT ABDOMEN AND PELVIS WITHOUT CONTRAST TECHNIQUE: Multidetector CT imaging of the abdomen and pelvis was performed following the standard protocol without IV contrast. RADIATION DOSE REDUCTION: This exam was performed according to the departmental dose-optimization program which includes automated exposure control, adjustment of the mA and/or kV according to patient size and/or use of iterative reconstruction technique. COMPARISON:  None Available. FINDINGS: Lower chest: No acute abnormality. Hepatobiliary: Nodular hepatic contour compatible with cirrhosis. Cholelithiasis. No evidence of  acute cholecystitis. No biliary dilation. Pancreas: Unremarkable. Spleen: Unremarkable. Adrenals/Urinary Tract: Normal adrenal glands. Atrophic bilateral native kidneys. Bilateral renal cystic lesions with low to intermediate density. For example a 2.9 cm cystic lesion (Hounsfield units 36) on the left and 3.3 cm cystic lesion (Hounsfield units 25). These likely represent benign cysts however the density is greater than simple cysts. Consider nonemergent renal ultrasound for further evaluation. No urinary calculi or hydronephrosis. Nondistended bladder. Stomach/Bowel: Stomach is within normal limits. No bowel obstruction. Question mild wall thickening about the small bowel in the left upper quadrant as well as the ascending and descending colon. Extensive sigmoid diverticulosis adjacent fluid and stranding is favored due to ascites. Normal appendix. Vascular/Lymphatic: Aortic atherosclerotic calcification. 3.1 cm infrarenal abdominal aortic aneurysm. Additional focal outpouching along the anterior aortic wall at the bifurcation. The aorta measures 3.3 cm in maximum diameter at this location. The focal outpouching measures 1.6 x 1.3 cm. This is incompletely evaluated without IV contrast but favored to represent a penetrating atherosclerotic ulcer. Reproductive: Unremarkable. Other: Small volume  intraperitoneal fluid in the abdomen and pelvis. Additional free air in the anterior upper abdomen is presumed due to peritoneal dialysis. Peritoneal dialysis catheter tip in the pelvis. Ascitic fluid and fat within a left inguinal hernia. Musculoskeletal: No acute fracture. IMPRESSION: 1. Question mild wall thickening about the small bowel in the left upper quadrant as well as the ascending and descending colon. Differential considerations include enterocolitis or congestive enteropathy/colopathy. 2. Extensive sigmoid diverticulosis. Adjacent fluid and stranding is favored due to ascites. 3. Cirrhosis with small volume ascites. 4. Small volume free air in the upper abdomen presumed due to peritoneal dialysis. 5. 3.1 cm infrarenal abdominal aortic aneurysm. Recommend follow-up ultrasound every 3 years. (Ref.: J Vasc Surg. 2018; 67:2-77 and J Am Coll Radiol 2013;10(10):789-794.) 6. Additional focal outpouching along the anterior aortic wall at the bifurcation which may represent a penetrating atherosclerotic ulcer. This measures 3.1 cm in maximum diameter with the outpouching measuring 1.6 cm in width and 1.3 cm in depth. This is incompletely evaluated without IV contrast. Consider CTA of the abdomen and pelvis for further evaluation. 7. Bilateral renal cystic lesions with low to intermediate density. These likely represent benign cysts however the density is greater than simple cysts. Consider nonemergent renal ultrasound for further evaluation. 8.  Aortic Atherosclerosis (ICD10-I70.0). Electronically Signed   By: Norman Gatlin M.D.   On: 12/20/2023 08:46   DG Chest Port 1 View Result Date: 12/20/2023 CLINICAL DATA:  Weakness, generalized abdominal and chest pain EXAM: PORTABLE CHEST 1 VIEW COMPARISON:  01/18/2023 FINDINGS: Cardiomegaly. Low lung volumes accentuate pulmonary vascularity. Bibasilar atelectasis. No pleural effusion or pneumothorax. No displaced rib fractures. IMPRESSION: Low lung volumes with  bibasilar atelectasis. Electronically Signed   By: Norman Gatlin M.D.   On: 12/20/2023 08:27    Procedures .Critical Care  Performed by: Freddi Hamilton, MD Authorized by: Freddi Hamilton, MD   Critical care provider statement:    Critical care time (minutes):  45   Critical care time was exclusive of:  Separately billable procedures and treating other patients   Critical care was necessary to treat or prevent imminent or life-threatening deterioration of the following conditions:  Cardiac failure, circulatory failure and sepsis   Critical care was time spent personally by me on the following activities:  Development of treatment plan with patient or surrogate, discussions with consultants, evaluation of patient's response to treatment, examination of patient, ordering and review of laboratory studies, ordering and review of  radiographic studies, ordering and performing treatments and interventions, pulse oximetry, re-evaluation of patient's condition and review of old charts     Medications Ordered in ED Medications  metroNIDAZOLE  (FLAGYL ) IVPB 500 mg (500 mg Intravenous New Bag/Given 12/20/23 1012)  vancomycin  (VANCOCIN ) IVPB 1000 mg/200 mL premix (has no administration in time range)  diltiazem  (CARDIZEM ) 125 mg in dextrose  5% 125 mL (1 mg/mL) infusion (5 mg/hr Intravenous New Bag/Given 12/20/23 1015)  gentamicin  cream (GARAMYCIN ) 0.1 % 1 Application (has no administration in time range)  dialysis solution 1.5% low-MG/low-CA dianeal  solution (has no administration in time range)  calcitRIOL  (ROCALTROL ) capsule 0.25 mcg (has no administration in time range)  ferric citrate  (AURYXIA ) tablet 210 mg (has no administration in time range)  heparin  sodium (porcine) 3,000 Units in dialysis solution 1.5% low-MG/low-CA 6,000 mL dialysis solution (has no administration in time range)  sodium chloride  0.9 % bolus 500 mL (0 mLs Intravenous Stopped 12/20/23 0920)  fentaNYL  (SUBLIMAZE ) injection 25 mcg  (25 mcg Intravenous Given 12/20/23 0834)  ondansetron  (ZOFRAN ) injection 4 mg (4 mg Intravenous Given 12/20/23 0834)  ceFEPIme  (MAXIPIME ) 2 g in sodium chloride  0.9 % 100 mL IVPB (0 g Intravenous Stopped 12/20/23 0921)  fentaNYL  (SUBLIMAZE ) injection 50 mcg (50 mcg Intravenous Given 12/20/23 1010)  sodium chloride  0.9 % bolus 500 mL (500 mLs Intravenous New Bag/Given 12/20/23 1008)    ED Course/ Medical Decision Making/ A&P Clinical Course as of 12/20/23 1051  Fri Dec 20, 2023  0815 Dr Geralynn will send a nurse to collect sample. However, will be closer to 4-5 hours from now (all doing dialysis), is ok to go ahead and start antibiotics.  [SG]    Clinical Course User Index [SG] Freddi Hamilton, MD                                 Medical Decision Making Amount and/or Complexity of Data Reviewed Labs: ordered.    Details: Leukopenia and left shift. Radiology: ordered and independent interpretation performed.    Details: No CHF.  No bowel obstruction. ECG/medicine tests: ordered and independent interpretation performed.    Details: A-fib with RVR  Risk Prescription drug management. Decision regarding hospitalization.   Patient presents with abdominal pain and vomiting and generalized weakness.  Part of his weakness may be from A-fib with RVR versus poor p.o. intake.  However at the same time with his abdominal pain I am concerned he has peritonitis.  I have discussed with nephrology who will get dialysate collected but for now we will start broad antibiotics.  His lactate is elevated and he was given some IV fluids.  He was hypotensive on arrival but no further hypotension.  He was given some fentanyl  which ultimately did help with his pain.  He was also started on Cardizem  drip without bolus given his tenuous blood pressure to help control the A-fib with RVR.  He will ultimately need admission and I have discussed with Dr. Waddell for admission.  He does appear systemically better though will still  need close monitoring.  No other clear source for an        Final Clinical Impression(s) / ED Diagnoses Final diagnoses:  Severe sepsis (HCC)  Atrial fibrillation with RVR [facility])    Rx / DC Orders ED Discharge Orders     None         Freddi Hamilton, MD 12/20/23 1053

## 2023-12-20 NOTE — Procedures (Signed)
 I have reviewed the PD prescription and made appropriate changes as needed.  Larry Poag MD  CKA 12/20/2023, 10:01 AM

## 2023-12-20 NOTE — Assessment & Plan Note (Addendum)
 ESRD on PD Nephrology consulted Appreciate assistance Does not want to go on HD  Hold lasix  with hypotension while on cardizem /poor PO intake

## 2023-12-20 NOTE — Assessment & Plan Note (Addendum)
 CT without contrast with findings of  focal outpouching along the anterior aortic wall at the bifurcation which may represent a penetrating atherosclerotic ulcer. This measures 3.1 cm in maximum diameter with the outpouching measuring 1.6 cm in width and 1.3 cm in depth. This is incompletely evaluated without IV contrast. With pain will check CTA abdomen/pelvis with contrast  Discussed with vascular on call: f/u outpatient, they will arrange.

## 2023-12-20 NOTE — Progress Notes (Signed)
 Pharmacy Antibiotic Note  Shaun Murillo is a 89 y.o. male for which pharmacy has been consulted for vancomycin  and zosyn  dosing for  Peritoneal dialysis peritonitis .  Patient with a history of ESRD on PD, GERD, AF on eliquis , gout, cirrhosis, rt parotid mass s/p resection.   WBC 1.9; LA 33.7; T 99.3; HR 147; RR 31  Plan: Cefepime  2g given in ED Vancomycin  1g given in ED - repeat per level Zosyn  2.25g q8h Monitor WBC, fever, renal function, cultures De-escalate when able F/u Nephrology plan  Height: 5' 8 (172.7 cm) Weight: 59 kg (130 lb) IBW/kg (Calculated) : 68.4  Temp (24hrs), Avg:97.5 F (36.4 C), Min:97 F (36.1 C), Max:98 F (36.7 C)  Recent Labs  Lab 12/20/23 0656 12/20/23 0853 12/20/23 1018  WBC 1.9*  --   --   CREATININE 6.98*  --   --   LATICACIDVEN  --  2.8* 3.7*    Estimated Creatinine Clearance: 6 mL/min (A) (by C-G formula based on SCr of 6.98 mg/dL (H)).    Allergies  Allergen Reactions   Codeine Nausea And Vomiting   Penicillins Nausea And Vomiting    Has patient had a PCN reaction causing immediate rash, facial/tongue/throat swelling, SOB or lightheadedness with hypotension: no Has patient had a PCN reaction causing severe rash involving mucus membranes or skin necrosis:no Has patient had a PCN reaction that required hospitalization no Has patient had a PCN reaction occurring within the last 10 years: no If all of the above answers are NO, then may proceed with Cephalosporin use.    Microbiology results: Pending  Thank you for allowing pharmacy to be a part of this patient's care.  Dorn Buttner, PharmD, BCPS 12/20/2023 6:06 PM ED Clinical Pharmacist -  [phone]

## 2023-12-20 NOTE — Progress Notes (Signed)
 Per ICU team no PD tonight will re-evaluate tomorrow, spoke with ICU RN.

## 2023-12-20 NOTE — Consult Note (Signed)
 Cardiology Consultation:   Patient ID: Shaun Murillo; [id number]; 1932/07/26   Admit date: 12/20/2023 Date of Consult: 12/20/2023  Primary Care Provider: Leonel Cole, MD Primary Cardiologist: Dr. Ronal Ross, MD   Patient Profile:   Shaun Murillo is a 89 y.o. male with a hx of ESRD on PD HD, HTN, right parotid mass s/p resection (2017), GERD, and atrial fibrillation on Eliquis  who is being seen today for AF with RVR at the request of Dr. Waddell.  History of Present Illness:   Mr. Shaun Murillo is an 89yo M with a hx as stated above who presented to [facility] after sustaining a fall with concern for abdominal pain and vomiting. Family is at bedside who assist in HPI. Son reports that the patient woke up on Wednesday with complaints of dizziness. While using the restroom, he fell off the toilet onto the floor. He subsequently had an episode of nausea, vomiting, and diarrhea. The vomiting continued and out of concern for dehydration, the patients son brought him to the hospital for further evaluation.   In the ED he was found to be quite hypotensive with a BP of 82/55. Cr elevated however at his baseline. LA at 2.8. CXR with bibasilar atelectasis. EKG/telemetry with AF with RVR with rates in the 130's on arrival. CT abdomen/pelvis with thickening of the small bowel with consideration for acute enterocolitis. Additional  Ly noted to have a focal outpouching along the anterior aortic wall felt to possibly represent a penetrating atherosclerotic Ulcer measuring 3.1 cm in maximum diameter with consideration for CTA of the abdomen and pelvis for further evaluation.He was ultimately admitted by internal medicine service for sepsis secondary to peritonitis secondary to peritoneal HD catheter infection. He was started on broad spectrum antibiotics and fluid resuscitated.   In reference to his atrial fibrillation, this is chronic for him, treated with carvedilol  3.125mg  BID. Given rates on arrival, IV diltiazem  was  initiated however with poor response given acute illness. On last OP cardiology noted, it appears his amlodipine  was stopped given soft BPs with little room for beta blocker titration. He has been consistent with his Eliquis  therapy. Cardiology has been consulted to assist with AF therapy.   Mr. Burrous was recently referred to outpatient cardiology and was seen by Dr. Ronal Ross 06/21/2023 for new AF. After being seen in the ED 01/2023. EKG obtained which appears to have artifact at baseline making it difficult to determine underlying rhythm although noted to be irregular. He was therefore referred to cardiology for follow up. During his visit, he was noted to have been deteriorating in his health with poor appetite and weight loss. Given low BP, amlodipine  was stopped  and beta blocker was not started given BP and stable rates at that time. Given his frailty, aggressive care was not pursued.   On my exam he is resting in the bed and mostly concerned about his abdominal pain. Otherwise denies SOB, chest pain, palpitations, LE edema, or orthopnea. Suffers from what sounds like chronic orthostatic hypotension.   Past Medical History:  Diagnosis Date   Arthritis    Complication of anesthesia    ESRD on peritoneal dialysis [facility])    Dr Dolan w/ [facility], started PD in 2021   Hard of hearing    Hypertension    Kidney stones    PONV (postoperative nausea and vomiting)     Past Surgical History:  Procedure Laterality Date   BACK SURGERY     COLONOSCOPY  ESOPHAGOGASTRODUODENOSCOPY     EYE SURGERY     cataracts bilateral   PAROTIDECTOMY Right 07/18/2016   Procedure: RIGHT SUPERFICIAL PAROTIDECTOMY WITH NERVE DISSECTION;  Surgeon: Vaughan Ricker, MD;  Location: [facility] OR;  Service: ENT;  Laterality: Right;  RIGHT PAROTIDECTOMY WITH SELECTIVE NECK DISSECTION   SHOULDER SURGERY Right      Prior to Admission medications   Medication Sig Start Date End Date Taking? Authorizing Provider  allopurinol  (ZYLOPRIM )  100 MG tablet Take 100 mg by mouth every Monday, Wednesday, and Friday. Takes in the evening 05/23/20  Yes [provider]  apixaban  (ELIQUIS ) 2.5 MG TABS tablet Take 1 tablet (2.5 mg total) by mouth 2 (two) times daily. 10/14/23  Yes Alvan Ronal BRAVO, MD  AURYXIA  1 GM 210 MG(Fe) tablet Take 210 mg by mouth 2 (two) times daily with a meal. 12/19/22  Yes [provider]  B Complex-C-Folic Acid  (DIALYVITE TABLET) TABS Take 1 tablet by mouth daily. 12/18/23  Yes [provider]  calcitRIOL  (ROCALTROL ) 0.25 MCG capsule Take 0.25 mcg by mouth every Tuesday, Thursday, and Saturday at 6 PM. In the evening 04/15/20  Yes [provider]  carvedilol  (COREG ) 3.125 MG tablet Take 3.125 mg by mouth 2 (two) times daily. 09/02/23  Yes [provider]  furosemide  (LASIX ) 40 MG tablet Take 80 mg by mouth every morning. 05/01/20  Yes [provider]  omeprazole (PRILOSEC) 40 MG capsule Take 40 mg by mouth every morning. 06/01/16  Yes [provider]  Potassium Chloride  ER 20 MEQ TBCR Take 1 tablet by mouth daily. 10/08/23  Yes [provider]    Inpatient Medications: Scheduled Meds:  allopurinol   100 mg Oral Q M,W,F   apixaban   2.5 mg Oral BID   [START ON 12/21/2023] calcitRIOL   0.25 mcg Oral Q T,Th,Sat-1800   carvedilol   3.125 mg Oral BID   ferric citrate   210 mg Oral TID WC   gentamicin  cream  1 Application Topical Daily   multivitamin  1 tablet Oral Daily   pantoprazole   80 mg Oral Daily   potassium chloride   20 mEq Oral Daily   vancomycin  variable dose per unstable renal function (pharmacist dosing)   Does not apply See admin instructions   Continuous Infusions:  [START ON 12/21/2023] ceFEPime  (MAXIPIME ) IV     dialysis solution 1.5% low-MG/low-CA     diltiazem  (CARDIZEM ) infusion Stopped (12/20/23 1215)   sodium chloride      PRN Meds: acetaminophen  **OR** acetaminophen , heparin  sodium (porcine) 3,000 Units in dialysis solution 1.5%  low-MG/low-CA 6,000 mL dialysis solution, HYDROcodone -acetaminophen , HYDROmorphone  (DILAUDID ) injection, ondansetron  **OR** ondansetron  (ZOFRAN ) IV  Allergies:    Allergies  Allergen Reactions   Codeine Nausea And Vomiting   Penicillins Nausea And Vomiting    Has patient had a PCN reaction causing immediate rash, facial/tongue/throat swelling, SOB or lightheadedness with hypotension: no Has patient had a PCN reaction causing severe rash involving mucus membranes or skin necrosis:no Has patient had a PCN reaction that required hospitalization no Has patient had a PCN reaction occurring within the last 10 years: no If all of the above answers are NO, then may proceed with Cephalosporin use.    Social History:   Social History   Socioeconomic History   Marital status: Widowed    Spouse name: Not on file   Number of children: Not on file   Years of education: Not on file   Highest education level: Not on file  Occupational History   Not on file  Tobacco Use   Smoking status: Never   Smokeless tobacco: Never  Substance and Sexual Activity   Alcohol  use: No   Drug use: No   Sexual activity: Not on file  Other Topics Concern   Not on file  Social History Narrative   Not on file   Social Drivers of Health   Financial Resource Strain: Not on file  Food Insecurity: Not on file  Transportation Needs: Not on file  Physical Activity: Not on file  Stress: Not on file  Social Connections: Unknown (02/25/2023)   Received from [facility], [facility]   Social Network    Social Network: Not on file  Intimate Partner Violence: Unknown (02/25/2023)   Received from [facility], [facility]   HITS    Physically Hurt: Not on file    Insult or Talk Down To: Not on file    Threaten Physical Harm: Not on file    Scream or Curse: Not on file    Family History:   No family history on file. Family Status:  No family status information on file.   CHA2DS2-VASc Score = 3   This  indicates a 3.2% annual risk of stroke. The patient's score is based upon: CHF History: 0 HTN History: 1 Diabetes History: 0 Stroke History: 0 Vascular Disease History: 0 Age Score: 2 Gender Score: 0  ROS:  Please see the history of present illness.  All other ROS reviewed and negative.     Physical Exam/Data:   Vitals:   12/20/23 1026 12/20/23 1100 12/20/23 1130 12/20/23 1315  BP:   92/73 101/72  Pulse:   (!) 113 (!) 133  Resp:   (!) 25 (!) 24  Temp: (!) 97 F (36.1 C)     TempSrc: Axillary     SpO2:   100% 100%  Weight:  59 kg    Height:  5' 8 (1.727 m)     No intake or output data in the 24 hours ending 12/20/23 1347 Filed Weights   12/20/23 1100  Weight: 59 kg   Body mass index is 19.77 kg/m.   General: Elderly, ill appearing. NAD Skin: Warm, dry, intact  Lungs:Clear to ausculation bilaterally. No wheezes, rales, or rhonchi. Breathing is unlabored. Cardiovascular: Irregularly irregular. No murmurs Extremities: No edema. Bilateral toe sores noted.  Neuro: Alert and oriented. No focal deficits. No facial asymmetry. MAE spontaneously. Psych: Responds to questions appropriately with normal affect.    EKG:  The EKG was personally reviewed and demonstrates:  12/20/23 Atrial fibrillation/flutter with HR 1336bpm  Telemetry:  Telemetry was personally reviewed and demonstrates:  AF with rates in the 120-130's  Relevant CV Studies:  ECHO: 01/19/2023   1. Left ventricular ejection fraction, by estimation, is 55 to 60%. The  left ventricle has normal function. The left ventricle has no regional  wall motion abnormalities. There is mild concentric left ventricular  hypertrophy. Left ventricular diastolic  parameters are indeterminate.   2. Right ventricular systolic function is normal. The right ventricular  size is mildly enlarged. Tricuspid regurgitation signal is inadequate for  assessing PA pressure.   3. Left atrial size was mildly dilated.   4. The mitral valve is  grossly normal. No evidence of mitral valve  regurgitation.   5. The aortic valve is tricuspid. Aortic valve regurgitation is mild.  Mild aortic valve stenosis.   6. Aortic dilatation noted. There is mild dilatation of the ascending  aorta, measuring 41 mm.   7. The inferior  vena cava is dilated in size with <50% respiratory  variability, suggesting right atrial pressure of 15 mmHg.   Laboratory Data:  Chemistry Recent Labs  Lab 12/20/23 0656  NA 134*  K 4.0  CL 94*  CO2 25  GLUCOSE 111*  BUN 34*  CREATININE 6.98*  CALCIUM 8.9  GFRNONAA 7*  ANIONGAP 15    Total Protein  Date Value Ref Range Status  12/20/2023 5.4 (L) 6.5 - 8.1 g/dL Final   Albumin   Date Value Ref Range Status  12/20/2023 2.2 (L) 3.5 - 5.0 g/dL Final   AST  Date Value Ref Range Status  12/20/2023 23 15 - 41 U/L Final   ALT  Date Value Ref Range Status  12/20/2023 24 0 - 44 U/L Final   Alkaline Phosphatase  Date Value Ref Range Status  12/20/2023 70 38 - 126 U/L Final   Total Bilirubin  Date Value Ref Range Status  12/20/2023 1.0 0.0 - 1.2 mg/dL Final   Hematology Recent Labs  Lab 12/20/23 0656  WBC 1.9*  RBC 3.62*  HGB 11.7*  HCT 36.3*  MCV 100.3*  MCH 32.3  MCHC 32.2  RDW 16.2*  PLT 233   Cardiac EnzymesNo results for input(s): TROPONINI in the last 168 hours. No results for input(s): TROPIPOC in the last 168 hours.  BNPNo results for input(s): BNP, PROBNP in the last 168 hours.  DDimer No results for input(s): DDIMER in the last 168 hours. TSH: No results found for: TSH Lipids:No results found for: CHOL, HDL, LDLCALC, LDLDIRECT, TRIG, CHOLHDL HgbA1c:No results found for: HGBA1C  Radiology/Studies:  CT ABDOMEN PELVIS WO CONTRAST Result Date: 12/20/2023 CLINICAL DATA:  Sepsis, nausea, vomiting for 1 day EXAM: CT ABDOMEN AND PELVIS WITHOUT CONTRAST TECHNIQUE: Multidetector CT imaging of the abdomen and pelvis was performed following the standard protocol  without IV contrast. RADIATION DOSE REDUCTION: This exam was performed according to the departmental dose-optimization program which includes automated exposure control, adjustment of the mA and/or kV according to patient size and/or use of iterative reconstruction technique. COMPARISON:  None Available. FINDINGS: Lower chest: No acute abnormality. Hepatobiliary: Nodular hepatic contour compatible with cirrhosis. Cholelithiasis. No evidence of acute cholecystitis. No biliary dilation. Pancreas: Unremarkable. Spleen: Unremarkable. Adrenals/Urinary Tract: Normal adrenal glands. Atrophic bilateral native kidneys. Bilateral renal cystic lesions with low to intermediate density. For example a 2.9 cm cystic lesion (Hounsfield units 36) on the left and 3.3 cm cystic lesion (Hounsfield units 25). These likely represent benign cysts however the density is greater than simple cysts. Consider nonemergent renal ultrasound for further evaluation. No urinary calculi or hydronephrosis. Nondistended bladder. Stomach/Bowel: Stomach is within normal limits. No bowel obstruction. Question mild wall thickening about the small bowel in the left upper quadrant as well as the ascending and descending colon. Extensive sigmoid diverticulosis adjacent fluid and stranding is favored due to ascites. Normal appendix. Vascular/Lymphatic: Aortic atherosclerotic calcification. 3.1 cm infrarenal abdominal aortic aneurysm. Additional focal outpouching along the anterior aortic wall at the bifurcation. The aorta measures 3.3 cm in maximum diameter at this location. The focal outpouching measures 1.6 x 1.3 cm. This is incompletely evaluated without IV contrast but favored to represent a penetrating atherosclerotic ulcer. Reproductive: Unremarkable. Other: Small volume intraperitoneal fluid in the abdomen and pelvis. Additional free air in the anterior upper abdomen is presumed due to peritoneal dialysis. Peritoneal dialysis catheter tip in the pelvis.  Ascitic fluid and fat within a left inguinal hernia. Musculoskeletal: No acute fracture. IMPRESSION: 1. Question mild wall thickening  about the small bowel in the left upper quadrant as well as the ascending and descending colon. Differential considerations include enterocolitis or congestive enteropathy/colopathy. 2. Extensive sigmoid diverticulosis. Adjacent fluid and stranding is favored due to ascites. 3. Cirrhosis with small volume ascites. 4. Small volume free air in the upper abdomen presumed due to peritoneal dialysis. 5. 3.1 cm infrarenal abdominal aortic aneurysm. Recommend follow-up ultrasound every 3 years. (Ref.: J Vasc Surg. 2018; 67:2-77 and J Am Coll Radiol 2013;10(10):789-794.) 6. Additional focal outpouching along the anterior aortic wall at the bifurcation which may represent a penetrating atherosclerotic ulcer. This measures 3.1 cm in maximum diameter with the outpouching measuring 1.6 cm in width and 1.3 cm in depth. This is incompletely evaluated without IV contrast. Consider CTA of the abdomen and pelvis for further evaluation. 7. Bilateral renal cystic lesions with low to intermediate density. These likely represent benign cysts however the density is greater than simple cysts. Consider nonemergent renal ultrasound for further evaluation. 8.  Aortic Atherosclerosis (ICD10-I70.0). Electronically Signed   By: Norman Gatlin M.D.   On: 12/20/2023 08:46   DG Chest Port 1 View Result Date: 12/20/2023 CLINICAL DATA:  Weakness, generalized abdominal and chest pain EXAM: PORTABLE CHEST 1 VIEW COMPARISON:  01/18/2023 FINDINGS: Cardiomegaly. Low lung volumes accentuate pulmonary vascularity. Bibasilar atelectasis. No pleural effusion or pneumothorax. No displaced rib fractures. IMPRESSION: Low lung volumes with bibasilar atelectasis. Electronically Signed   By: Norman Gatlin M.D.   On: 12/20/2023 08:27   Assessment and Plan:   Atrial fibrillation with RVR: Incidentally found during a hospital  admission 06/2023. Referred to cardiology with no plans for aggressive care given frailty and declining health. Beta blocker was not initiated due to hypotension and good rate control. Plan was to continue Eliquis  and if AF with RVR, treat with Amiodarone . TSH ordered for today with pending results. Plan to start IV Amiodarone  given hypotension and acute infection. Will load with 150mg  then continue 60mg  for 6 hours>>>30mg  thereafter. Hold carvedilol . Follow response.   Sepsis secondary to peritonitis with PD HD catheter: Evidence of acute sepsis with elevated LA , leukocytosis, and tachycardia. Broad spectrum ABX and IVF resuscitation started with peritoneal cultures sent. Given severe hypotension will message primary team for low dose pressor support to allow great ability to treat abdominal pain and HRs. Management per primary team.   ESRD: On home peritoneal HD since 2021. Seen by nephrology today with suspicion for PD-cath related peritonitis. Following PD fluid culture.   LE sores: Plan LE dopplers per daughters request to assess non-healing toe blisters. +DP pulses bilaterally with good color return.   Atherosclerosis ulcer of the aorta: Noted on CT although not well differentiated with recommendations for follow up CTA abdomen/pelvis.   AAA: Measuring 3.1cm with recommendations for Q3Y US  follow up.   Failure to thrive: Recent weight loss, poor appetite with advanced age and significant co-morbidities with ESRD requiring PD hemodialysis. Primary team with recommendations for nutrition/PT consultation with consideration for palliative care referral .     Principal Problem:   sepsis secondary to Peritonitis associated with peritoneal dialysis [facility]) Active Problems:   Anemia in chronic kidney disease   ESRD on dialysis (HCC)   Chronic atrial fibrillation with RVR (HCC)   Sepsis (HCC)   Atherosclerotic ulcer of aorta (HCC)   Abdominal aortic aneurysm (AAA) (HCC)   Failure to thrive in  adult   For questions or updates, please contact [facility] Please consult [email or web address] for contact info under Cardiology/STEMI.  SignedKate Minus NP-C [facility] Pager: [phone] 12/20/2023 1:47 PM

## 2023-12-20 NOTE — H&P (Signed)
 History and Physical    Patient: Shaun Murillo FMW:[id number] DOB: 28-Nov-1931 DOA: 12/20/2023 DOS: the patient was seen and examined on 12/20/2023 PCP: Leonel Cole, MD  Patient coming from: Home - lives alone, but one of his sons is there 24/7. Uses walker to ambulate, but very weak.    Chief Complaint: fall with abdominal pain and vomiting   HPI: Shaun Murillo is a 89 y.o. male with medical history significant of  t of ESRD on peritoneal dialysis,  HTN, right parotid mass s/p resection (2017), GERD, atrial fibrillation on eliquis  and gout who presented to ED with complaints of a fall and abdominal pain with vomiting. Son gives history. Last Wednesday he woke up and was dizzy. He fell off the toilet. He had 2 cups of coffee and has not had anything to eat or drink since then. He kept going to the bathroom and had diarrhea and vomiting. The dry heaving continued through Thursday. He did eat some soup yesterday for lunch. He complained of stomach pain all day yesterday and was up all night so they brought him to ED.    Denies any fever/chills, vision changes/headaches, chest pain or palpitations, shortness of breath or cough, dysuria or leg swelling.    He does not smoke or drink alcohol .   ER Course:  vitals: afebrile, bp: 82/55, HR: 73, RR: 18, oxygen: 100%RA Pertinent labs: wbc: 1.9, hgb: 11.7, BUN: 34, creatinine: 6.98, lactic acid: 2.8, INR: 1.6,  CXR: low lung volumes with bibasilar atelectasis.  CT abdomen/pelvis: Question mild wall thickening about the small bowel in the left upper quadrant as well as the ascending and descending colon. Differential considerations include enterocolitis or congestive enteropathy/colopathy. 2. Extensive sigmoid diverticulosis. Adjacent fluid and stranding is favored due to ascites. 3. Cirrhosis with small volume ascites. 4. Small volume free air in the upper abdomen presumed due to peritoneal dialysis. 5. 3.1 cm infrarenal abdominal aortic aneurysm.  Recommend follow-up ultrasound every 3 years. (Ref.: J Vasc Surg. 2018; 67:2-77 and J Am Coll Radiol 2013;10(10):789-794.) 6. Additional focal outpouching along the anterior aortic wall at the bifurcation which may represent a penetrating atherosclerotic ulcer. This measures 3.1 cm in maximum diameter with the outpouching measuring 1.6 cm in width and 1.3 cm in depth. This is incompletely evaluated without IV contrast. Consider CTA of the abdomen and pelvis for further evaluation. 7. Bilateral renal cystic lesions with low to intermediate density. These likely represent benign cysts however the density is greater than simple cysts. Consider nonemergent renal ultrasound for further evaluation. 8.  Aortic Atherosclerosis (ICD10-I70.0). In ED: cultures obtained. Peritoneal culture, started on antibiotics. Started on cardizem  drip for afib with RVR and given 1L IVF. Nephrology consulted and [facility] asked to admit.    Review of Systems: As mentioned in the history of present illness. All other systems reviewed and are negative. Past Medical History:  Diagnosis Date   Arthritis    Complication of anesthesia    ESRD on peritoneal dialysis (HCC)    Dr Dolan w/ [facility], started PD in 2021   Hard of hearing    Hypertension    Kidney stones    PONV (postoperative nausea and vomiting)    Past Surgical History:  Procedure Laterality Date   BACK SURGERY     COLONOSCOPY     ESOPHAGOGASTRODUODENOSCOPY     EYE SURGERY     cataracts bilateral   PAROTIDECTOMY Right 07/18/2016   Procedure: RIGHT SUPERFICIAL PAROTIDECTOMY WITH NERVE DISSECTION;  Surgeon: Vaughan Ricker,  MD;  Location: [facility] OR;  Service: ENT;  Laterality: Right;  RIGHT PAROTIDECTOMY WITH SELECTIVE NECK DISSECTION   SHOULDER SURGERY Right    Social History:  reports that he has never smoked. He has never used smokeless tobacco. He reports that he does not drink alcohol  and does not use drugs.  Allergies  Allergen Reactions   Codeine Nausea  And Vomiting   Penicillins Nausea And Vomiting    Has patient had a PCN reaction causing immediate rash, facial/tongue/throat swelling, SOB or lightheadedness with hypotension: no Has patient had a PCN reaction causing severe rash involving mucus membranes or skin necrosis:no Has patient had a PCN reaction that required hospitalization no Has patient had a PCN reaction occurring within the last 10 years: no If all of the above answers are NO, then may proceed with Cephalosporin use.    No family history on file.  Prior to Admission medications   Medication Sig Start Date End Date Taking? Authorizing Provider  allopurinol  (ZYLOPRIM ) 100 MG tablet Take 100 mg by mouth every Monday, Wednesday, and Friday. Takes in the evening 05/23/20   [provider]  amLODipine  (NORVASC ) 5 MG tablet Take 5 mg by mouth at bedtime. Patient not taking: Reported on 06/21/2023 06/01/16   [provider]  apixaban  (ELIQUIS ) 2.5 MG TABS tablet Take 1 tablet (2.5 mg total) by mouth 2 (two) times daily. 10/14/23   Alvan Ronal BRAVO, MD  AURYXIA  1 GM 210 MG(Fe) tablet Take 210 mg by mouth 2 (two) times daily with a meal. 12/19/22   [provider]  calcitRIOL  (ROCALTROL ) 0.25 MCG capsule Take 0.25 mcg by mouth every Tuesday, Thursday, and Saturday at 6 PM. In the evening 04/15/20   [provider]  ferrous sulfate  325 (65 FE) MG tablet Take 325 mg by mouth in the morning. 06/07/20   [provider]  furosemide  (LASIX ) 40 MG tablet Take 80 mg by mouth every morning. 05/01/20   [provider]  hydrALAZINE  (APRESOLINE ) 25 MG tablet Take 25 mg by mouth in the morning and at bedtime. 10/19/22   [provider]  omeprazole (PRILOSEC) 40 MG capsule Take 40 mg by mouth every morning. 06/01/16   [provider]    Physical Exam: Vitals:   12/20/23 1015 12/20/23 1026 12/20/23 1100 12/20/23 1130  BP: 112/81   92/73  Pulse: (!) 142   (!) 113  Resp: (!) 22   (!) 25   Temp:  (!) 97 F (36.1 C)    TempSrc:  Axillary    SpO2: 97%   100%  Weight:   59 kg   Height:   5' 8 (1.727 m)    General:  Appears calm and comfortable and is in NAD, cachetic.  Eyes:  PERRL, EOMI, normal lids, iris ENT:  very HOH, lips & tongue, dry mucous mucosa; appropriate dentition Neck:  no LAD, masses or thyromegaly; no carotid bruits Cardiovascular:  irregularly irregular, no m/r/g. No LE edema.  Respiratory:   CTA bilaterally with no wheezes/rales/rhonchi.  Normal respiratory effort. Abdomen:  soft, TTP, no rebound or guarding. Catheter site clean, no drainage  Back:   normal alignment, no CVAT Skin:  no rash or induration seen on limited exam. Superficial wounds on toes. Skin tenting  Musculoskeletal:  grossly normal tone BUE/BLE, good ROM, no bony abnormality Lower extremity:  No LE edema.  Limited foot exam with no ulcerations.  2+ distal pulses.superficial abrasions on top of toes  Psychiatric:  grossly normal mood  and affect, speech fluent and appropriate, AOx3 Neurologic:  CN 2-12 grossly intact, moves all extremities in coordinated fashion, sensation intact   Radiological Exams on Admission: Independently reviewed - see discussion in A/P where applicable  CT ABDOMEN PELVIS WO CONTRAST Result Date: 12/20/2023 CLINICAL DATA:  Sepsis, nausea, vomiting for 1 day EXAM: CT ABDOMEN AND PELVIS WITHOUT CONTRAST TECHNIQUE: Multidetector CT imaging of the abdomen and pelvis was performed following the standard protocol without IV contrast. RADIATION DOSE REDUCTION: This exam was performed according to the departmental dose-optimization program which includes automated exposure control, adjustment of the mA and/or kV according to patient size and/or use of iterative reconstruction technique. COMPARISON:  None Available. FINDINGS: Lower chest: No acute abnormality. Hepatobiliary: Nodular hepatic contour compatible with cirrhosis. Cholelithiasis. No evidence of acute cholecystitis. No  biliary dilation. Pancreas: Unremarkable. Spleen: Unremarkable. Adrenals/Urinary Tract: Normal adrenal glands. Atrophic bilateral native kidneys. Bilateral renal cystic lesions with low to intermediate density. For example a 2.9 cm cystic lesion (Hounsfield units 36) on the left and 3.3 cm cystic lesion (Hounsfield units 25). These likely represent benign cysts however the density is greater than simple cysts. Consider nonemergent renal ultrasound for further evaluation. No urinary calculi or hydronephrosis. Nondistended bladder. Stomach/Bowel: Stomach is within normal limits. No bowel obstruction. Question mild wall thickening about the small bowel in the left upper quadrant as well as the ascending and descending colon. Extensive sigmoid diverticulosis adjacent fluid and stranding is favored due to ascites. Normal appendix. Vascular/Lymphatic: Aortic atherosclerotic calcification. 3.1 cm infrarenal abdominal aortic aneurysm. Additional focal outpouching along the anterior aortic wall at the bifurcation. The aorta measures 3.3 cm in maximum diameter at this location. The focal outpouching measures 1.6 x 1.3 cm. This is incompletely evaluated without IV contrast but favored to represent a penetrating atherosclerotic ulcer. Reproductive: Unremarkable. Other: Small volume intraperitoneal fluid in the abdomen and pelvis. Additional free air in the anterior upper abdomen is presumed due to peritoneal dialysis. Peritoneal dialysis catheter tip in the pelvis. Ascitic fluid and fat within a left inguinal hernia. Musculoskeletal: No acute fracture. IMPRESSION: 1. Question mild wall thickening about the small bowel in the left upper quadrant as well as the ascending and descending colon. Differential considerations include enterocolitis or congestive enteropathy/colopathy. 2. Extensive sigmoid diverticulosis. Adjacent fluid and stranding is favored due to ascites. 3. Cirrhosis with small volume ascites. 4. Small volume free  air in the upper abdomen presumed due to peritoneal dialysis. 5. 3.1 cm infrarenal abdominal aortic aneurysm. Recommend follow-up ultrasound every 3 years. (Ref.: J Vasc Surg. 2018; 67:2-77 and J Am Coll Radiol 2013;10(10):789-794.) 6. Additional focal outpouching along the anterior aortic wall at the bifurcation which may represent a penetrating atherosclerotic ulcer. This measures 3.1 cm in maximum diameter with the outpouching measuring 1.6 cm in width and 1.3 cm in depth. This is incompletely evaluated without IV contrast. Consider CTA of the abdomen and pelvis for further evaluation. 7. Bilateral renal cystic lesions with low to intermediate density. These likely represent benign cysts however the density is greater than simple cysts. Consider nonemergent renal ultrasound for further evaluation. 8.  Aortic Atherosclerosis (ICD10-I70.0). Electronically Signed   By: Norman Gatlin M.D.   On: 12/20/2023 08:46   DG Chest Port 1 View Result Date: 12/20/2023 CLINICAL DATA:  Weakness, generalized abdominal and chest pain EXAM: PORTABLE CHEST 1 VIEW COMPARISON:  01/18/2023 FINDINGS: Cardiomegaly. Low lung volumes accentuate pulmonary vascularity. Bibasilar atelectasis. No pleural effusion or pneumothorax. No displaced rib fractures. IMPRESSION: Low lung volumes with  bibasilar atelectasis. Electronically Signed   By: Norman Gatlin M.D.   On: 12/20/2023 08:27    EKG: Independently reviewed.  Atrial fib with RVR with rate 136; nonspecific ST changes with no evidence of acute ischemia   Labs on Admission: I have personally reviewed the available labs and imaging studies at the time of the admission.  Pertinent labs:   wbc: 1.9,  hgb: 11.7,  BUN: 34,  creatinine: 6.98,  lactic acid: 2.8,  INR: 1.6,   Assessment and Plan: Principal Problem:   sepsis secondary to Peritonitis associated with peritoneal dialysis [facility] [address]) Active Problems:   Chronic atrial fibrillation with RVR (HCC)   Atherosclerotic ulcer  of aorta (HCC)   Abdominal aortic aneurysm (AAA) (HCC)   ESRD on dialysis (HCC)   Anemia in chronic kidney disease   Failure to thrive in adult   Sepsis (HCC)    Assessment and Plan: * sepsis secondary to Peritonitis associated with peritoneal dialysis (HCC) 89 year old presenting to ED with 3 day history of nausea/vomiting, dizziness and weakness with abdominal pain found to be septic with likely peritonitis associated with peritoneal dialysis -admit to progressive -sepsis criteria with leukopenia, tachycardia, elevated lactic acid  -source likely from peritoneal catheter. Cell count and culture pending. Per nurse was cloudy  -continue broad spectrum abx  -trend lactic acid -will given an additional 500 cc bolus  -follow peritoneal cultures/cell count  -trend CBC  -also ? Mild bowel wall thickening differential enterocolitis vs. Congestion. Does have diarrhea. Will check stool studies and enteric precautions    Chronic atrial fibrillation with RVR (HCC) Chronic atrial fibrillation on coreg  3.125mg  Bid and eliquis  Presented rate controlled, but then RVR  Started on cardizem  gtt, but pressures soft Per cardiology note, norvasc  stopped due to hypotension and on low dose beta blocker with not much room to titrate with hypotension  Likely also orthostatic in setting of vomiting/poor PO intake and diarrhea Sepsis likely contributing to RVR Continue low dose coreg  and eliquis  Rate still elevated and do not have much room with cardizem  with low bp.  Cardiology consulted, may need amio. TSH ordered  Echo 01/2023: normal EF with indeterminate diastolic function. Trivial pericardial effusion   Atherosclerotic ulcer of aorta (HCC) CT without contrast with findings of  focal outpouching along the anterior aortic wall at the bifurcation which may represent a penetrating atherosclerotic ulcer. This measures 3.1 cm in maximum diameter with the outpouching measuring 1.6 cm in width and 1.3 cm in  depth. This is incompletely evaluated without IV contrast. With pain will check CTA abdomen/pelvis with contrast    Abdominal aortic aneurysm (AAA) (HCC) Measures 3.1cm infrarenal abdominal aortic aneurysm  F/u US  q 3 years   ESRD on dialysis (HCC) ESRD on PD Nephrology consulted Appreciate assistance Does not want to go on HD  Hold lasix  with hypotension while on cardizem /poor PO intake   Failure to thrive in adult Poor PO intake and overall decline Nutrition/PT consult Consider palliative care   Anemia in chronic kidney disease Stable, continue to follow      Advance Care Planning:   Code Status: Limited: Do not attempt resuscitation (DNR) -DNR-LIMITED -Do Not Intubate/DNI    Consults: nephrology, cardiology, PT and nutrition   DVT Prophylaxis: eliquis    Family Communication: son and daughter in law at bedside.   Severity of Illness: The appropriate patient status for this patient is INPATIENT. Inpatient status is judged to be reasonable and necessary in order to provide the required intensity  of service to ensure the patient's safety. The patient's presenting symptoms, physical exam findings, and initial radiographic and laboratory data in the context of their chronic comorbidities is felt to place them at high risk for further clinical deterioration. Furthermore, it is not anticipated that the patient will be medically stable for discharge from the hospital within 2 midnights of admission.   * I certify that at the point of admission it is my clinical judgment that the patient will require inpatient hospital care spanning beyond 2 midnights from the point of admission due to high intensity of service, high risk for further deterioration and high frequency of surveillance required.*  Author: Isaiah Geralds, MD 12/20/2023 12:32 PM  For on call review [email or web address].

## 2023-12-20 NOTE — Procedures (Signed)
 Central Venous Catheter Insertion Procedure Note  Shaun Murillo  [id number]  [birth year]  Date:12/20/23  Time:7:01 PM   Provider Performing:Larue Lightner JAYSON Sharps     Procedure: Insertion of Non-tunneled Central Venous Catheter(36556)with US  guidance (23062)    Indication(s) HD, vasopressor admin   Consent Risks of the procedure as well as the alternatives and risks of each were explained to the patient and/or caregiver.  Consent for the procedure was obtained and is signed in the bedside chart  Anesthesia Topical only with 1% lidocaine    Timeout Verified patient identification, verified procedure, site/side was marked, verified correct patient position, special equipment/implants available, medications/allergies/relevant history reviewed, required imaging and test results available.  Sterile Technique Maximal sterile technique including full sterile barrier drape, hand hygiene, sterile gown, sterile gloves, mask, hair covering, sterile ultrasound probe cover (if used).  Procedure Description Area of catheter insertion was cleaned with chlorhexidine  and draped in sterile fashion.   With real-time ultrasound guidance a HD catheter was placed into the right internal jugular vein.  Nonpulsatile blood flow and easy flushing noted in all ports.  The catheter was sutured in place and sterile dressing applied.  Complications/Tolerance None; patient tolerated the procedure well. Chest X-ray is ordered to verify placement for internal jugular or subclavian cannulation.  Chest x-ray is not ordered for femoral cannulation.  EBL Minimal  Specimen(s) None   Sherlean Sharps AGACNP-BC   [facility] 12/20/2023, 7:01 PM  Please see [email or web address] for pager details.  From 7A-7P if no response, please call [phone]. After hours, please call ELink [phone].

## 2023-12-20 NOTE — Assessment & Plan Note (Addendum)
 Chronic atrial fibrillation on coreg  3.125mg  Bid and eliquis  Presented rate controlled, but then RVR  Started on cardizem  gtt, but pressures soft Per cardiology note, norvasc  stopped due to hypotension and on low dose beta blocker with not much room to titrate with hypotension  Likely also orthostatic in setting of vomiting/poor PO intake and diarrhea Sepsis likely contributing to RVR Continue low dose coreg  and eliquis  Rate still elevated and do not have much room with cardizem  with low bp.  Cardiology consulted, may need amio. TSH ordered  Echo 01/2023: normal EF with indeterminate diastolic function. Trivial pericardial effusion

## 2023-12-20 NOTE — Assessment & Plan Note (Signed)
 Poor PO intake and overall decline Nutrition/PT consult Consider palliative care

## 2023-12-20 NOTE — Consult Note (Addendum)
 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 Upon initial workup in ED, patient was hypotensive and in Afib RVR with concerns of peritonitis. Initial labs showing lactic acidosis, Cr of 6.98, INR of 1.6, WBC 1.9, and hgb 11.7. CT of abdomen pelvis- concern for mild wall thickening around the small bowel in LUQ and the ascending/descending colon, cirrhosis, and small volume of free air in upper abdomen due to PD, and a 3.1 cm infrarenal abdominal aortic aneurysm. Peritoneal culture obtained and was started on antibiotics, and cardizem  gtt was started for Afib RVR. Nephrology and Cardiology initially consulted with [facility] to admit. Despite fluid resuscitation, patient remains hypotensive. PCCM consulted for refractory hypotension in setting of peritonitis.    Pertinent  Medical History   Past Medical History:  Diagnosis Date   Arthritis    Complication of anesthesia    ESRD on peritoneal dialysis (HCC)    Dr Dolan w/ [facility], started PD in 2021   Hard of hearing    Hypertension    Kidney stones    PONV (postoperative nausea and vomiting)      Significant Hospital Events: Including procedures, antibiotic start and stop dates in addition to other pertinent events   PCCM admit to ICU for septic shock, peritonitis   Interim History / Subjective:  Patient following commands, hard of hearing   Objective   Blood pressure (!) 69/58, pulse (!) 130, temperature 98 F (36.7 C), temperature source Oral, resp. rate (!) 29, height 5' 8 (1.727 m), weight 59 kg, SpO2 96%.       No intake or  output data in the 24 hours ending 12/20/23 1655 Filed Weights   12/20/23 1100  Weight: 59 kg    Examination: General: critically ill pleasant elderly male, weak lying on ED stretcher  HENT: Normocephalic, PERRLA intact, poor dentition, missing teeth  Lungs: clear, diminished, no distress Cardiovascular: s1,s2, irregular, afib RVR, no JVD, no MRG Abdomen: catheter site-no drainage, clean Extremities: move all extremities  Neuro: alert, oriented x4, hard of hearing GU: deferred   Resolved Hospital Problem list   N/a  Assessment & Plan:  Septic shock secondary to Peritonitis  ESRD Undergoes Peritoneal Dialysis-dumps fluid in bathroom shower  Nephro consulted, culture obtained  P: Admit to ICU Start levophed , systolic >90, will placed HD cath for central access  Obtain consent-spoke to patient about benefit vs risk-patient wants to proceed with CVC  Initiate Amio bolus with gtt for Afib RVR  Continue vanc, and zosyn   Continue fluid resuscitation  Follow blood cultures, and peritoneal culture Trend labs-BMET, CBC, lactic acid   Afib RVR Afib hx On eliquis   P: Cards following, adding recs Cardizem  gtt stopped, amio gtt bolus with gtt added       Best Practice (right click and Reselect all SmartList Selections daily)   Diet/type: NPO w/ oral meds DVT prophylaxis SCD Pressure ulcer(s): N/A GI prophylaxis: PPI Lines: N/A will have HD cath  Foley:  N/A Code Status:  DNR Last date of multidisciplinary goals of care discussion [pt and family updated at beside]  Labs   CBC: Recent Labs  Lab 12/20/23 0656  WBC 1.9*  NEUTROABS 1.0*  HGB 11.7*  HCT 36.3*  MCV 100.3*  PLT 233    Basic Metabolic Panel: Recent Labs  Lab 12/20/23 0656  NA 134*  K 4.0  CL 94*  CO2 25  GLUCOSE 111*  BUN 34*  CREATININE 6.98*  CALCIUM 8.9   GFR: Estimated Creatinine Clearance: 6 mL/min (A) (by C-G formula based on SCr of 6.98 mg/dL (H)). Recent Labs  Lab 12/20/23 0656  12/20/23 0853 12/20/23 1018  WBC 1.9*  --   --   LATICACIDVEN  --  2.8* 3.7*    Liver Function Tests: Recent Labs  Lab 12/20/23 0656  AST 23  ALT 24  ALKPHOS 70  BILITOT 1.0  PROT 5.4*  ALBUMIN  2.2*   No results for input(s): LIPASE, AMYLASE in the last 168 hours. No results for input(s): AMMONIA in the last 168 hours.  ABG No results found for: PHART, PCO2ART, PO2ART, HCO3, TCO2, ACIDBASEDEF, O2SAT   Coagulation Profile: Recent Labs  Lab 12/20/23 0836  INR 1.6*    Cardiac Enzymes: No results for input(s): CKTOTAL, CKMB, CKMBINDEX, TROPONINI in the last 168 hours.  HbA1C: No results found for: HGBA1C  CBG: No results for input(s): GLUCAP in the last 168 hours.  Review of Systems:   See HPI   Past Medical History:  He,  has a past medical history of Arthritis, Complication of anesthesia, ESRD on peritoneal dialysis (HCC), Hard of hearing, Hypertension, Kidney stones, and PONV (postoperative nausea and vomiting).   Surgical History:   Past Surgical History:  Procedure Laterality Date   BACK SURGERY     COLONOSCOPY     ESOPHAGOGASTRODUODENOSCOPY     EYE SURGERY     cataracts bilateral   PAROTIDECTOMY Right 07/18/2016   Procedure: RIGHT SUPERFICIAL PAROTIDECTOMY WITH NERVE DISSECTION;  Surgeon: Vaughan Ricker, MD;  Location: [facility] OR;  Service: ENT;  Laterality: Right;  RIGHT PAROTIDECTOMY WITH SELECTIVE NECK DISSECTION   SHOULDER SURGERY Right      Social History:   reports that he has never smoked. He has never used smokeless tobacco. He reports that he does not drink alcohol  and does not use drugs.   Family History:  His family history is not on file.   Allergies Allergies  Allergen Reactions   Codeine Nausea And Vomiting   Penicillins Nausea And Vomiting    Has patient had a PCN reaction causing immediate rash, facial/tongue/throat swelling, SOB or lightheadedness with hypotension: no Has patient had a PCN reaction  causing severe rash involving mucus membranes or skin necrosis:no Has patient had a PCN reaction that required hospitalization no Has patient had a PCN reaction occurring within the last 10 years: no If all of the above answers are NO, then may proceed with Cephalosporin use.     Home Medications  Prior to Admission medications   Medication Sig Start Date End Date Taking? Authorizing Provider  allopurinol  (ZYLOPRIM ) 100 MG tablet Take 100 mg by mouth every Monday, Wednesday, and Friday. Takes in the evening 05/23/20  Yes [provider]  apixaban  (ELIQUIS ) 2.5 MG TABS tablet Take 1 tablet (2.5 mg total) by mouth 2 (two) times daily. 10/14/23  Yes Alvan Ronal BRAVO, MD  AURYXIA  1 GM 210 MG(Fe) tablet Take 210 mg by mouth 2 (two) times daily with a meal. 12/19/22  Yes [provider]  B Complex-C-Folic Acid  (DIALYVITE TABLET) TABS Take 1 tablet by mouth daily. 12/18/23  Yes [provider]  calcitRIOL  (ROCALTROL ) 0.25 MCG capsule Take 0.25 mcg by mouth every Tuesday, Thursday, and Saturday at 6 PM. In the evening 04/15/20  Yes [provider]  carvedilol  (COREG ) 3.125 MG tablet Take 3.125 mg by mouth 2 (two) times daily. 09/02/23  Yes [provider]  furosemide  (LASIX ) 40 MG tablet Take 80 mg by mouth every morning. 05/01/20  Yes [provider]  omeprazole (PRILOSEC) 40 MG capsule Take 40 mg by mouth every morning. 06/01/16  Yes [provider]  Potassium Chloride  ER 20 MEQ TBCR Take 1 tablet by mouth daily. 10/08/23  Yes [provider]     Critical care time: 45 mins     [facility]   [facility] 12/20/2023, 5:49 PM  Please see [email or web address] for pager details.  From 7A-7P if no response, please call [phone]. After hours, please call ELink [phone].

## 2023-12-20 NOTE — Assessment & Plan Note (Signed)
Stable, continue to follow.

## 2023-12-20 NOTE — Assessment & Plan Note (Signed)
 89 year old presenting to ED with 3 day history of nausea/vomiting, dizziness and weakness with abdominal pain found to be septic with likely peritonitis associated with peritoneal dialysis -admit to progressive -sepsis criteria with leukopenia, tachycardia, elevated lactic acid  -source likely from peritoneal catheter. Cell count and culture pending. Per nurse was cloudy  -continue broad spectrum abx  -trend lactic acid -will given an additional 500 cc bolus  -follow peritoneal cultures/cell count  -trend CBC  -also ? Mild bowel wall thickening differential enterocolitis vs. Congestion. Does have diarrhea. Will check stool studies and enteric precautions

## 2023-12-21 ENCOUNTER — Encounter (HOSPITAL_COMMUNITY): Payer: Medicare Other

## 2023-12-21 ENCOUNTER — Inpatient Hospital Stay (HOSPITAL_COMMUNITY): Payer: Medicare Other

## 2023-12-21 DIAGNOSIS — E43 Unspecified severe protein-calorie malnutrition: Secondary | ICD-10-CM

## 2023-12-21 DIAGNOSIS — R6521 Severe sepsis with septic shock: Secondary | ICD-10-CM

## 2023-12-21 DIAGNOSIS — N186 End stage renal disease: Secondary | ICD-10-CM | POA: Diagnosis not present

## 2023-12-21 DIAGNOSIS — A419 Sepsis, unspecified organism: Secondary | ICD-10-CM | POA: Diagnosis not present

## 2023-12-21 DIAGNOSIS — D638 Anemia in other chronic diseases classified elsewhere: Secondary | ICD-10-CM

## 2023-12-21 DIAGNOSIS — I4891 Unspecified atrial fibrillation: Secondary | ICD-10-CM | POA: Diagnosis not present

## 2023-12-21 DIAGNOSIS — I739 Peripheral vascular disease, unspecified: Secondary | ICD-10-CM

## 2023-12-21 LAB — COMPREHENSIVE METABOLIC PANEL
ALT: 19 U/L (ref 0–44)
AST: 21 U/L (ref 15–41)
Albumin: 1.6 g/dL — ABNORMAL LOW (ref 3.5–5.0)
Alkaline Phosphatase: 47 U/L (ref 38–126)
Anion gap: 15 (ref 5–15)
BUN: 44 mg/dL — ABNORMAL HIGH (ref 8–23)
CO2: 19 mmol/L — ABNORMAL LOW (ref 22–32)
Calcium: 8 mg/dL — ABNORMAL LOW (ref 8.9–10.3)
Chloride: 95 mmol/L — ABNORMAL LOW (ref 98–111)
Creatinine, Ser: 7.45 mg/dL — ABNORMAL HIGH (ref 0.61–1.24)
GFR, Estimated: 6 mL/min — ABNORMAL LOW (ref >60)
Glucose, Bld: 79 mg/dL (ref 70–99)
Potassium: 4.6 mmol/L (ref 3.5–5.1)
Sodium: 129 mmol/L — ABNORMAL LOW (ref 135–145)
Total Bilirubin: 0.9 mg/dL (ref 0.0–1.2)
Total Protein: 4.4 g/dL — ABNORMAL LOW (ref 6.5–8.1)

## 2023-12-21 LAB — CBC
HCT: 30.2 % — ABNORMAL LOW (ref 39.0–52.0)
Hemoglobin: 9.9 g/dL — ABNORMAL LOW (ref 13.0–17.0)
MCH: 32.4 pg (ref 26.0–34.0)
MCHC: 32.8 g/dL (ref 30.0–36.0)
MCV: 98.7 fL (ref 80.0–100.0)
Platelets: 246 10*3/uL (ref 150–400)
RBC: 3.06 MIL/uL — ABNORMAL LOW (ref 4.22–5.81)
RDW: 16.3 % — ABNORMAL HIGH (ref 11.5–15.5)
WBC: 10.5 10*3/uL (ref 4.0–10.5)
nRBC: 0 % (ref 0.0–0.2)

## 2023-12-21 LAB — MAGNESIUM: Magnesium: 1.2 mg/dL — ABNORMAL LOW (ref 1.7–2.4)

## 2023-12-21 LAB — LACTIC ACID, PLASMA
Lactic Acid, Venous: 2.9 mmol/L (ref 0.5–1.9)
Lactic Acid, Venous: 3.2 mmol/L (ref 0.5–1.9)

## 2023-12-21 MED ORDER — LACTATED RINGERS IV BOLUS
500.0000 mL | Freq: Once | INTRAVENOUS | Status: AC
  Administered 2023-12-21: 500 mL via INTRAVENOUS

## 2023-12-21 MED ORDER — SODIUM CHLORIDE 0.9 % IV SOLN
12.5000 mg | Freq: Four times a day (QID) | INTRAVENOUS | Status: DC | PRN
  Administered 2023-12-21: 12.5 mg via INTRAVENOUS
  Filled 2023-12-21: qty 0.5

## 2023-12-21 MED ORDER — HEPARIN SODIUM (PORCINE) 1000 UNIT/ML IJ SOLN
INTRAPERITONEAL | Status: DC | PRN
  Filled 2023-12-21 (×3): qty 6000

## 2023-12-21 MED ORDER — MAGNESIUM SULFATE 2 GM/50ML IV SOLN
2.0000 g | Freq: Once | INTRAVENOUS | Status: AC
  Administered 2023-12-21: 2 g via INTRAVENOUS
  Filled 2023-12-21: qty 50

## 2023-12-21 MED ORDER — METOCLOPRAMIDE HCL 5 MG/ML IJ SOLN
5.0000 mg | Freq: Once | INTRAMUSCULAR | Status: AC
  Administered 2023-12-21: 5 mg via INTRAVENOUS
  Filled 2023-12-21: qty 2

## 2023-12-21 MED ORDER — DELFLEX-LC/1.5% DEXTROSE 344 MOSM/L IP SOLN: INTRAPERITONEAL | Status: DC

## 2023-12-21 NOTE — Progress Notes (Addendum)
 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 Upon initial workup in ED, patient was hypotensive and in Afib RVR with concerns of peritonitis. Initial labs showing lactic acidosis, Cr of 6.98, INR of 1.6, WBC 1.9, and hgb 11.7. CT of abdomen pelvis- concern for mild wall thickening around the small bowel in LUQ and the ascending/descending colon, cirrhosis, and small volume of free air in upper abdomen due to PD, and a 3.1 cm infrarenal abdominal aortic aneurysm. Peritoneal culture obtained and was started on antibiotics, and cardizem  gtt was started for Afib RVR. Nephrology and Cardiology initially consulted with TRH to admit. Despite fluid resuscitation, patient remains hypotensive. PCCM consulted for refractory hypotension in setting of peritonitis.    Pertinent  Medical History   Past Medical History:  Diagnosis Date   Arthritis    Complication of anesthesia    ESRD on peritoneal dialysis (HCC)    Dr Dolan w/ [facility], started PD in 2021   Hard of hearing    Hypertension    Kidney stones    PONV (postoperative nausea and vomiting)      Significant Hospital Events: Including procedures, antibiotic start and stop dates in addition to other pertinent events   2/7 PCCM admit to ICU for septic shock, peritonitis, found to be in A-fib with RVR with lactic acidosis attempted IV amiodarone  and blood pressure dropped requiring norepinephrine .  Antibiotics initiated. 2/8 growing gram-negative rods in peritoneal washing, remains on norepinephrine , mental status improved, acid-base, sodium,  BUN/creatinine all a little worse reattempting PD. Did have some nausea during day  Interim History / Subjective:  Feels a little better still having abd fullness  Objective   Blood pressure 98/65, pulse (!) 104, temperature 97.9 F (36.6 C), temperature source Oral, resp. rate (!) 26, height 5' 8 (1.727 m), weight 60.2 kg, SpO2 100%.        Intake/Output Summary (Last 24 hours) at 12/21/2023 1610 Last data filed at 12/21/2023 1400 Gross per 24 hour  Intake 1218.37 ml  Output --  Net 1218.37 ml   Filed Weights   12/20/23 1100 12/20/23 1820 12/21/23 0500  Weight: 59 kg 60.2 kg 60.2 kg    Examination:  General 89 year old male sitting up in bed no distress HENT NCAT no JVD  Pulm fine crackles no accessory use  Card reg irreg af  Abd soft still tender  Ext warm  Neuro intact [facility] Problem list   N/a  Assessment & Plan:  Septic shock with lactic acidosis secondary to SBP with chronic peritoneal catheter.  Currently growing abundant K Pneumoniae Still on pressors and PCT actually up a little since yesterday  Plan Keep euvolemic Wean norepinephrine  for systolic blood pressure greater than 90 Trend procalcitonin Continue day #3 Zosyn , await sensitivities & narrow as appropriate, with on-going pressor needs I worry a little about ESBL producer  Holding his Coreg  and Lasix  Continue telemetry monitoring Checking 1 view of abd look for ileus & see if this is contributing   ESRD Undergoes Peritoneal Dialysis-dumps fluid in bathroom shower  Did not tol  PD last night well. Inc'd SOB and pain/pressure. Suspect mixed picture of his pain from peritonitis and also perhaps transient hypoperfusion Plan PD per nephro wonder if we can do shorter dwell time but more freq? Will d/w nephro Serial labs   Fluid and electrolyte imbalance: Hypomagnesemia, hyponatremia  Replaced Plan PD per nephro Replace Mg  Am chem  Atrial fibrillation RVR, known history of A-fib on  chronic Eliquis  Plan Continue telemetry Continue DOAC Continue amiodarone  infusion for now, hopefully discontinue soon and resuming Coreg  once shock resolved Ensure potassium greater than 4 and magnesium  greater than 2  H/o cirrhosis  Plan Supportive care  Severe protein calorie malnutrition Plan Maximize nutrition  Anemia of chronic disease Plan Trend hemoglobin Trigger for transfusion is less than 7 or active bleeding with hemodynamic compromise Continuing his iron  replacement  Best Practice (right click and Reselect all SmartList Selections daily)   Diet/type: NPO w/ oral meds DVT prophylaxis DOAC Pressure ulcer(s): N/A GI prophylaxis: PPI Lines: N/A and Dialysis Catheter  HD cath  Foley:  N/A Code Status:  DNR Last date of multidisciplinary goals of care discussion [pt and family updated at beside]      My cct 32 min

## 2023-12-21 NOTE — Progress Notes (Signed)
 Lactate trending down Continue current care as outlined

## 2023-12-21 NOTE — Progress Notes (Signed)
 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 Upon initial workup in ED, patient was hypotensive and in Afib RVR with concerns of peritonitis. Initial labs showing lactic acidosis, Cr of 6.98, INR of 1.6, WBC 1.9, and hgb 11.7. CT of abdomen pelvis- concern for mild wall thickening around the small bowel in LUQ and the ascending/descending colon, cirrhosis, and small volume of free air in upper abdomen due to PD, and a 3.1 cm infrarenal abdominal aortic aneurysm. Peritoneal culture obtained and was started on antibiotics, and cardizem  gtt was started for Afib RVR. Nephrology and Cardiology initially consulted with [facility] to admit. Despite fluid resuscitation, patient remains hypotensive. PCCM consulted for refractory hypotension in setting of peritonitis.    Pertinent  Medical History   Past Medical History:  Diagnosis Date   Arthritis    Complication of anesthesia    ESRD on peritoneal dialysis (HCC)    Dr Dolan w/ [facility], started PD in 2021   Hard of hearing    Hypertension    Kidney stones    PONV (postoperative nausea and vomiting)      Significant Hospital Events: Including procedures, antibiotic start and stop dates in addition to other pertinent events   2/7 PCCM admit to ICU for septic shock, peritonitis, found to be in A-fib with RVR with lactic acidosis attempted IV amiodarone  and blood pressure dropped requiring norepinephrine .  Antibiotics initiated. 2/8 growing gram-negative rods in peritoneal washing, remains on norepinephrine , mental status improved, acid-base, sodium,  BUN/creatinine all a little worse reattempting PD Interim History / Subjective:  No distress, still has some abdominal pain but much improved compared to yesterday  Objective   Blood pressure (!) 110/93, pulse (!) 128, temperature 98.9 F (37.2 C), temperature source Oral, resp. rate (!) 24, height 5' 8 (1.727 m), weight 60.2 kg, SpO2 100%.        Intake/Output Summary (Last 24 hours) at 12/21/2023 0749 Last data filed at 12/21/2023 0600 Gross per 24 hour  Intake 749.04 ml  Output --  Net 749.04 ml   Filed Weights   12/20/23 1100 12/20/23 1820 12/21/23 0500  Weight: 59 kg 60.2 kg 60.2 kg    Examination:  General this is a very pleasant 89 year old white male resting in bed no acute distress HEENT normocephalic atraumatic no jugular venous distention appreciated right IJ triple-lumen catheter is unremarkable Pulmonary: Faint crackles both bases currently room air no accessory use Cardiac: Irregular irregular, remains in atrial fibrillation with bursts of rapid ventricular response still on amiodarone  Abdomen: Somewhat tender to palpation PD catheter noted hypoactive bowel sounds but tolerating oral intake GU minimal urine output this is baseline Neuro: Very hard of hearing, moves all extremities, no focal deficits Extremities: Warm dry brisk capillary refill.  Resolved Hospital Problem list   N/a  Assessment & Plan:  Septic shock with lactic acidosis secondary to SBP with chronic peritoneal catheter.  Currently growing gram-negative rods Plan Keep euvolemic Wean norepinephrine  for systolic blood pressure greater than 90 Discontinue vancomycin  Repeat lactate Trend procalcitonin Continue day #2 Zosyn , await cultures and narrow based on sensitivities  Holding his Coreg  and Lasix  Continue telemetry monitoring  ESRD Undergoes Peritoneal Dialysis-dumps fluid in bathroom shower  Plan Hemodynamically looking better Attempting PD today Strict intake  output  Hypomagnesemia Plan Replace and recheck am  Atrial fibrillation RVR, known history of A-fib on chronic Eliquis  Plan Continue telemetry Continue DOAC Continue amiodarone  infusion for now, hopefully discontinue soon and resuming Coreg  once shock resolved Ensure potassium greater than 4 and magnesium  greater than 2  H/o cirrhosis  Plan Supportive care  Severe protein calorie malnutrition Plan Maximize nutrition  Anemia of chronic disease Plan Trend hemoglobin Trigger for transfusion is less than 7 or active bleeding with hemodynamic compromise Continuing his iron  replacement  Best Practice (right click and Reselect all SmartList Selections daily)   Diet/type: NPO w/ oral meds DVT prophylaxis DOAC Pressure ulcer(s): N/A GI prophylaxis: PPI Lines: N/A and Dialysis Catheter  HD cath  Foley:  N/A Code Status:  DNR Last date of multidisciplinary goals of care discussion [pt and family updated at beside]      My cct 34 min

## 2023-12-21 NOTE — Progress Notes (Signed)
 VASCULAR LAB    ABI has been performed.  See CV proc for preliminary results.   Aerion Bagdasarian, RVT 12/21/2023, 1:40 PM

## 2023-12-21 NOTE — Progress Notes (Signed)
 1,200 units placed to red and blue lumens of trialysis catheter.

## 2023-12-21 NOTE — Progress Notes (Addendum)
 eLink Physician-Brief Progress Note Patient Name: JEZREEL JUSTINIANO DOB: 12/14/1931 MRN: [id number]   Date of Service  12/21/2023  HPI/Events of Note  89 year old male with a history of peritoneal dialysis catheter associated peritonitis with refractory nausea/vomiting despite 2 doses of Zofran   eICU Interventions  Attempt Phenergan  for refractory nausea/vomiting   0221 - Patient on PD and was feeling SOB. Bedside called HD to address. Had to empty the cycle and is asking nephro to have the fill rate adjusted for tomorrow night.   Intervention Category Minor Interventions: Routine modifications to care plan (e.g. PRN medications for pain, fever)  Lacinda Curvin 12/21/2023, 7:38 PM

## 2023-12-21 NOTE — Progress Notes (Signed)
 [facility] Progress Note  Subjective: looks much better, abd pain is better and keep can take a deep breath now.   Vitals:   12/21/23 0945 12/21/23 1000 12/21/23 1015 12/21/23 1030  BP: 95/78 (!) 107/90 (!) 79/64 (!) 74/52  Pulse: (!) 113 (!) 115 (!) 117 (!) 115  Resp: (!) 24 (!) 21 (!) 22 (!) 25  Temp:      TempSrc:      SpO2: 100% 100% 100% 99%  Weight:      Height:        Started PD in July 2021 after PD cath placed by Dr Cara.  01/2023 - RSV infection w/ fall, [facility] admit 02/2023 - weak and tired 06/2023 - recent dx afib, switched norvasc  to coreg  for rate control 08/2023 - still weak, denied pt referral 09/2023 - balance issues 10/2023 - borderline clearance w/ PD, feels very poorly on 6 exchanges, short dwells of 1 hr 11/2023 - chronic back pain and progressive weakness, declining, will cont PD although not meeting kt/v for clearance, discussed iHD pt declined and discussed palliative/ hospice care. Pt said when he fails PD he will do hospice   Exam: Gen alert, no distress, looks much better Sclera anicteric, throat clear  No jvd or bruits Chest clear bilat to bases, no rales/ wheezing RRR no RG Abd mild-mod abd tenderness, improving, no mass or ascites +bs Ext no LE edema Neuro is alert, Ox 3 , nf     PD cath mid R abdomen - exit site is clean    Renal-related home meds: - norvasc  5 hs - auryxia  210 ac tid - rocaltrol  0.25 mcg po tts - lasix  80 qam - others: PPI, eliquis , zyloprim , prns    OP PD: nightly CCPD Dr Dolan  60.5kg   1h dwells x 6 overnight, fill vol 2.3 L - last HB  10.4 on 2/3, last mircera sq 75 mcg on 1/23       Assessment/ Plan: Abd pain/ N/V - in pt w/ ESRD on peritoneal dialysis since 2021. No prior peritonitis. Peritoneal fluid TNC is 645 ( < 50 is wnl) and gram stain showing GNR's. IV abx cefepime / flagyl / vanc narrowed today to IV zosyn . Await cx results. Appears to be responding to abx.  ESRD - on CCPD. Temp cath placed  last night in case needs CRRT. No need CRRT for now, okay to due PD tonight.  BP - got several 250-500 cc boluses yesterday. + pressor support this am levo 6 micrograms/min. Holding home bp lowering meds.  Anemia of esrd - Hb 11- 12 here, last esa on 1/23. No esa needs.  Secondary hyperparathyroidism - CCa in range, add on phos. Cont binder w/ meals and po vdra on tts schedule.  GOC - pt is DNR. Pt's PD clearance is not great, has chronic fatigue related to this and /or other issues. Pt refuses HD and has stated that when the PD fails he will go to hospice.            Myer Fret MD  [facility] 12/21/2023, 10:44 AM  Recent Labs  Lab 12/20/23 0656 12/21/23 0301  HGB 11.7* 9.9*  ALBUMIN  2.2* 1.6*  CALCIUM 8.9 8.0*  CREATININE 6.98* 7.45*  K 4.0 4.6   No results for input(s): IRON , TIBC, FERRITIN in the last 168 hours. Inpatient medications:  allopurinol   100 mg Oral Q M,W,F   apixaban   2.5 mg Oral BID   calcitRIOL   0.25 mcg Oral Q T,Th,Sat-1800  Chlorhexidine  Gluconate Cloth  6 each Topical Daily   ferric citrate   210 mg Oral TID WC   gentamicin  cream  1 Application Topical Daily   multivitamin  1 tablet Oral Daily   pantoprazole   80 mg Oral Daily   potassium chloride   20 mEq Oral Daily   sodium chloride  flush  10-40 mL Intracatheter Q12H    sodium chloride      amiodarone  30 mg/hr (12/21/23 0800)   dialysis solution 1.5% low-MG/low-CA     magnesium  sulfate bolus IVPB 2 g (12/21/23 1032)   norepinephrine  (LEVOPHED ) Adult infusion 7 mcg/min (12/21/23 0800)   piperacillin -tazobactam (ZOSYN )  IV 100 mL/hr at 12/21/23 0800   sodium chloride      acetaminophen  **OR** acetaminophen , heparin  sodium (porcine) 3,000 Units in dialysis solution 1.5% low-MG/low-CA 6,000 mL dialysis solution, HYDROcodone -acetaminophen , HYDROmorphone  (DILAUDID ) injection, ondansetron  **OR** ondansetron  (ZOFRAN ) IV, sodium chloride  flush

## 2023-12-21 NOTE — Evaluation (Signed)
 Physical Therapy Evaluation Patient Details Name: Shaun Murillo MRN: [id number] DOB: 1932-05-12 Today's Date: 12/21/2023  History of Present Illness  89yr old male who presented to [facility] ED 2/7 due to a fall, and abdominal pain with nausea with vomiting. Admitted to ICU for septic shock, peritonitis, found to be in A-fib with RVR. 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   Clinical Impression  Pt admitted with above diagnosis. Required CGA-Min A for bed mobility, transfer and short distance gait in room with RW. Declined ambulating further restroom at this time. Mild dizziness with transitions (unable to obtain a standing BP) but symptoms resolved with sitting. Good family support at d/c but they are considering additional assist arrangements as pt has gotten weak and more reliant on family to assist. Agreeable to HHPT at d/c pending progress. Pt currently with functional limitations due to the deficits listed below (see PT Problem List). Pt will benefit from acute skilled PT to increase their independence and safety with mobility to allow discharge.      Supine BP 102/79; Seated BP 99/69 HR 114 +dizziness. Unable to obtain standing BP. After transfer to chair, seated with feet dependent: BP 111/83 HR 115. SpO2 100% on RA.       If plan is discharge home, recommend the following: A little help with walking and/or transfers;A little help with bathing/dressing/bathroom;Assistance with cooking/housework;Help with stairs or ramp for entrance;Assist for transportation   Can travel by private vehicle        Equipment Recommendations BSC/3in1  Recommendations for Other Services       Functional Status Assessment Patient has had a recent decline in their functional status and demonstrates the ability to make significant improvements in function in a reasonable and predictable amount of time.     Precautions / Restrictions  Precautions Precautions: Fall Precaution Comments: monitor BP/orthostatics Restrictions Weight Bearing Restrictions Per Provider Order: No      Mobility  Bed Mobility Overal bed mobility: Needs Assistance Bed Mobility: Supine to Sit     Supine to sit: Contact guard     General bed mobility comments: CGA to rise to EOB, slower, a bit effortful. Stable upon sitting but lightheaded.    Transfers Overall transfer level: Needs assistance Equipment used: Rolling walker (2 wheels) Transfers: Sit to/from Stand Sit to Stand: Min assist, +2 safety/equipment           General transfer comment: Min assist for boost and balance to rise with +2 for safety/line management. Tolerated standing approx 4 min while attempting BP read but unable to register. Pt + dizziness.    Ambulation/Gait Ambulation/Gait assistance: Min assist Gait Distance (Feet): 5 Feet Assistive device: Rolling walker (2 wheels) Gait Pattern/deviations: Step-through pattern, Decreased stride length, Shuffle Gait velocity: dec     General Gait Details: Small shuffled steps with RW for support, min assist for RW control, VC for sequencing with turn. No overt buckling noted.  Stairs            Wheelchair Mobility     Tilt Bed    Modified Rankin (Stroke Patients Only)       Balance Overall balance assessment: Needs assistance Sitting-balance support: No upper extremity supported, Feet supported Sitting balance-Leahy Scale: Fair     Standing balance support: Bilateral upper extremity supported, Reliant on assistive device for balance Standing balance-Leahy Scale: Poor  Pertinent Vitals/Pain Pain Assessment Pain Assessment: No/denies pain    Home Living Family/patient expects to be discharged to:: Private residence Living Arrangements: Children Available Help at Discharge: Family;Available 24 hours/day Type of Home: House Home Access: Stairs to  enter Entrance Stairs-Rails: None Entrance Stairs-Number of Steps: 3   Home Layout: One level Home Equipment: Educational Psychologist (4 wheels)      Prior Function Prior Level of Function : Needs assist             Mobility Comments: Using rollator PTA. Has gotten progressively weak, now falling. ADLs Comments: Assisted with ADLs by family. Has become more dependent recently. Family havinga  difficult time caring for him, looking at getting an aide.     Extremity/Trunk Assessment   Upper Extremity Assessment Upper Extremity Assessment: Defer to OT evaluation    Lower Extremity Assessment Lower Extremity Assessment: Generalized weakness       Communication   Communication Communication: Hearing impairment Cueing Techniques: Verbal cues  Cognition Arousal: Alert Behavior During Therapy: WFL for tasks assessed/performed Overall Cognitive Status: Within Functional Limits for tasks assessed                                          General Comments General comments (skin integrity, edema, etc.): Supine BP 102/79; Seated BP 99/69 HR 114 +dizziness. Unable to obtain standing BP. After transfer to chair, seated with feet dependent: BP 111/83 HR 115. SpO2 100% on RA.    Exercises General Exercises - Lower Extremity Ankle Circles/Pumps: AROM, Both, 10 reps, Seated Long Arc Quad: Strengthening, Both, 10 reps, Seated   Assessment/Plan    PT Assessment Patient needs continued PT services  PT Problem List Decreased strength;Decreased activity tolerance;Decreased balance;Decreased mobility;Decreased knowledge of use of DME;Cardiopulmonary status limiting activity       PT Treatment Interventions DME instruction;Gait training;Stair training;Functional mobility training;Therapeutic exercise;Therapeutic activities;Balance training;Neuromuscular re-education;Patient/family education    PT Goals (Current goals can be found in the Care Plan section)  Acute Rehab PT  Goals Patient Stated Goal: Feel well enough to go home PT Goal Formulation: With patient/family Time For Goal Achievement: 01/04/24 Potential to Achieve Goals: Fair    Frequency Min 1X/week     Co-evaluation               AM-PAC PT 6 Clicks Mobility  Outcome Measure Help needed turning from your back to your side while in a flat bed without using bedrails?: None Help needed moving from lying on your back to sitting on the side of a flat bed without using bedrails?: A Little Help needed moving to and from a bed to a chair (including a wheelchair)?: A Little Help needed standing up from a chair using your arms (e.g., wheelchair or bedside chair)?: A Little Help needed to walk in hospital room?: A Little Help needed climbing 3-5 steps with a railing? : A Lot 6 Click Score: 18    End of Session Equipment Utilized During Treatment: Gait belt Activity Tolerance: Patient tolerated treatment well Patient left: in chair;with call bell/phone within reach;with chair alarm set;with family/visitor present Nurse Communication: Mobility status PT Visit Diagnosis: Unsteadiness on feet (R26.81);Other abnormalities of gait and mobility (R26.89);Muscle weakness (generalized) (M62.81);History of falling (Z91.81)    Time: 8891-8874 PT Time Calculation (min) (ACUTE ONLY): 17 min   Charges:   PT Evaluation $PT Eval Moderate Complexity: 1 Mod  PT General Charges $$ ACUTE PT VISIT: 1 Visit         Leontine Roads, PT, DPT [facility]  Rehabilitation Services Physical Therapist Office: [phone] Website: [email or web address]   Leontine GORMAN Roads 12/21/2023, 1:39 PM

## 2023-12-22 LAB — CBC
HCT: 30.7 % — ABNORMAL LOW (ref 39.0–52.0)
Hemoglobin: 10.3 g/dL — ABNORMAL LOW (ref 13.0–17.0)
MCH: 32.1 pg (ref 26.0–34.0)
MCHC: 33.6 g/dL (ref 30.0–36.0)
MCV: 95.6 fL (ref 80.0–100.0)
Platelets: 226 10*3/uL (ref 150–400)
RBC: 3.21 MIL/uL — ABNORMAL LOW (ref 4.22–5.81)
RDW: 15.8 % — ABNORMAL HIGH (ref 11.5–15.5)
WBC: 12.9 10*3/uL — ABNORMAL HIGH (ref 4.0–10.5)
nRBC: 0 % (ref 0.0–0.2)

## 2023-12-22 LAB — BODY FLUID CULTURE W GRAM STAIN

## 2023-12-22 LAB — COMPREHENSIVE METABOLIC PANEL
ALT: 18 U/L (ref 0–44)
AST: 17 U/L (ref 15–41)
Albumin: 1.7 g/dL — ABNORMAL LOW (ref 3.5–5.0)
Alkaline Phosphatase: 54 U/L (ref 38–126)
Anion gap: 15 (ref 5–15)
BUN: 42 mg/dL — ABNORMAL HIGH (ref 8–23)
CO2: 20 mmol/L — ABNORMAL LOW (ref 22–32)
Calcium: 8.2 mg/dL — ABNORMAL LOW (ref 8.9–10.3)
Chloride: 90 mmol/L — ABNORMAL LOW (ref 98–111)
Creatinine, Ser: 6.84 mg/dL — ABNORMAL HIGH (ref 0.61–1.24)
GFR, Estimated: 7 mL/min — ABNORMAL LOW (ref >60)
Glucose, Bld: 107 mg/dL — ABNORMAL HIGH (ref 70–99)
Potassium: 4.2 mmol/L (ref 3.5–5.1)
Sodium: 125 mmol/L — ABNORMAL LOW (ref 135–145)
Total Bilirubin: 0.4 mg/dL (ref 0.0–1.2)
Total Protein: 4.7 g/dL — ABNORMAL LOW (ref 6.5–8.1)

## 2023-12-22 LAB — MAGNESIUM: Magnesium: 1.7 mg/dL (ref 1.7–2.4)

## 2023-12-22 LAB — PROCALCITONIN: Procalcitonin: 24.97 ng/mL

## 2023-12-22 LAB — VAS US ABI WITH/WO TBI
Left ABI: 1.14
Right ABI: 1.09

## 2023-12-22 MED ORDER — DELFLEX-LC/1.5% DEXTROSE 344 MOSM/L IP SOLN: INTRAPERITONEAL | Status: DC

## 2023-12-22 MED ORDER — MIDODRINE HCL 5 MG PO TABS
5.0000 mg | ORAL_TABLET | Freq: Three times a day (TID) | ORAL | Status: DC
  Administered 2023-12-22 – 2023-12-23 (×2): 5 mg via ORAL
  Filled 2023-12-22 (×2): qty 1

## 2023-12-22 MED ORDER — ENSURE ENLIVE PO LIQD
237.0000 mL | Freq: Two times a day (BID) | ORAL | Status: DC
  Administered 2023-12-23: 237 mL via ORAL

## 2023-12-22 MED ORDER — SODIUM CHLORIDE 0.9 % IV SOLN
1.0000 g | INTRAVENOUS | Status: DC
Start: 2023-12-22 — End: 2023-12-26
  Administered 2023-12-22 – 2023-12-26 (×5): 1 g via INTRAVENOUS
  Filled 2023-12-22 (×5): qty 1

## 2023-12-22 MED ORDER — HEPARIN SODIUM (PORCINE) 1000 UNIT/ML IJ SOLN
INTRAPERITONEAL | Status: DC | PRN
  Filled 2023-12-22 (×3): qty 2000

## 2023-12-22 MED ORDER — MIDODRINE HCL 5 MG PO TABS: 5.0000 mg | ORAL_TABLET | Freq: Three times a day (TID) | ORAL | Status: DC

## 2023-12-22 MED ORDER — HEPARIN SODIUM (PORCINE) 1000 UNIT/ML IJ SOLN
INTRAPERITONEAL | Status: DC | PRN
  Filled 2023-12-22 (×4): qty 6000

## 2023-12-22 MED ORDER — METOCLOPRAMIDE HCL 5 MG/ML IJ SOLN
5.0000 mg | Freq: Three times a day (TID) | INTRAMUSCULAR | Status: DC | PRN
  Administered 2023-12-22 – 2023-12-23 (×3): 5 mg via INTRAVENOUS
  Filled 2023-12-22 (×3): qty 2

## 2023-12-22 MED ORDER — MAGNESIUM SULFATE 2 GM/50ML IV SOLN
2.0000 g | Freq: Once | INTRAVENOUS | Status: AC
  Administered 2023-12-22: 2 g via INTRAVENOUS
  Filled 2023-12-22: qty 50

## 2023-12-22 NOTE — Progress Notes (Signed)
 Initial Nutrition Assessment  DOCUMENTATION CODES:   Not applicable  INTERVENTION:  Liberalized diet Ensure Plus High Protein po BID, each supplement provides 350 kcal and 20 grams of protein.    NUTRITION DIAGNOSIS:   Increased nutrient needs related to acute illness as evidenced by estimated needs.    GOAL:   Patient will meet greater than or equal to 90% of their needs    MONITOR:   PO intake, Weight trends, Supplement acceptance, Labs  REASON FOR ASSESSMENT:   Consult Assessment of nutrition requirement/status, Poor PO  ASSESSMENT:  89 y.o. M, Presented to ED from home with reports of fall and abdominal pain with vomiting.  Patient lives at home with 24/7 care shared between his sons. Admitted with primary problem sepsis secondary to Peritonitis associated with peritoneal dialysis.  PMH: ESRD, HTN, Hearing loss, Arthritis.  RN reports 100% of breakfast but refused lunch due to pain and nausea. Patient diet advanced 2/7. Unable to obtain nutritional history for patient at this time. There is no benefit to excessive dietary restrictions related advanced age, increased nutrient needs. Patient would benefit better from liberalized diet to help meet increased nutrient needs and promote better oral intake.   2/7 PCCM admit to ICU for septic shock, peritonitis, found to be in A-fib with RVR with lactic acidosis attempted IV amiodarone  and blood pressure dropped requiring norepinephrine .  Antibiotics initiated. 2/8 growing gram-negative rods in peritoneal washing, remains on norepinephrine , mental status improved, acid-base, sodium, BUN/creatinine all a little worse reattempting PD Admit weight: 60.2 kg Weight history: 12/22/23 65.9 kg  06/21/23 61.5 kg  01/20/23 61.6 kg  01/15/23 68.5 kg   Hospital weight history: 12/22/23 0500 65.9 kg 145.28 lbs  12/21/23 0500 60.2 kg 132.72 lbs  12/20/23 1820 60.2 kg 132.72 lbs  12/20/23 1100 59 kg 130 lbs      Average Meal  Intake: No current documentation  Nutritionally Relevant Medications: Scheduled Meds:  allopurinol   100 mg Oral Q M,W,F   apixaban   2.5 mg Oral BID   calcitRIOL   0.25 mcg Oral Q T,Th,Sat-1800   ferric citrate   210 mg Oral TID WC   multivitamin  1 tablet Oral Daily   potassium chloride   20 mEq Oral Daily    Continuous Infusions:  amiodarone  30 mg/hr (12/22/23 1357)   dialysis solution 1.5% low-MG/low-CA     norepinephrine  (LEVOPHED ) Adult infusion 4 mcg/min (12/22/23 0930)   sodium chloride       PRN Meds:.acetaminophen  **OR** acetaminophen , heparin  sodium (porcine) 3,000 Units in dialysis solution 1.5% low-MG/low-CA 6,000 mL dialysis solution, HYDROcodone -acetaminophen , HYDROmorphone  (DILAUDID ) injection, ondansetron  **OR** ondansetron  (ZOFRAN ) IV, promethazine  (PHENERGAN ) injection (IM or IVPB), sodium chloride  flush  Labs Reviewed: Component Ref Range & Units (hover) 08:25 (12/22/23) 1 d ago (12/21/23) 2 d ago (12/20/23)  Sodium 125 Low  129 Low  134 Low       NUTRITION - FOCUSED PHYSICAL EXAM:  Deferred   Diet Order:   Diet Order             Diet renal with fluid restriction Fluid restriction: 1200 mL Fluid; Room service appropriate? Yes; Fluid consistency: Thin  Diet effective now                   EDUCATION NEEDS:      Skin:  Skin Assessment: Reviewed RN Assessment  Last BM:  12/21/23  Height:   Ht Readings from Last 1 Encounters:  12/20/23 5' 8 (1.727 m)    Weight:   Wt  Readings from Last 1 Encounters:  12/22/23 65.9 kg    Ideal Body Weight:     BMI:  Body mass index is 22.09 kg/m.  Estimated Nutritional Needs:   Kcal:  1825-2125 kcal  Protein:  80-100 g  Fluid:  1.5 L    Jenna Pew RDN, LDN Clinical Dietitian   If unable to reach, please contact RD Inpatient secure chat group between 8 am-4 pm daily

## 2023-12-22 NOTE — Progress Notes (Signed)
 Pharmacy Antibiotic Note  Shaun Murillo is a 89 y.o. male for which pharmacy has been consulted for vancomycin  and zosyn  dosing for  Peritoneal dialysis peritonitis .  WBC 12.9; LA 3.2; T 98 Peritoneal Cx returned positive with Klebsiella pneumonia sensitive to Ceftazidime     Plan: Discontinue Zosyn  Start ceftazidime  IV 1 gram daily while inpatient  De-escalate when able F/u Nephrology plan  Height: 5' 8 (172.7 cm) Weight: 65.9 kg (145 lb 4.5 oz) IBW/kg (Calculated) : 68.4  Temp (24hrs), Avg:97.4 F (36.3 C), Min:96.8 F (36 C), Max:98 F (36.7 C)  Recent Labs  Lab 12/20/23 0656 12/20/23 0853 12/20/23 1018 12/21/23 0301 12/21/23 1210 12/21/23 2006 12/22/23 0825  WBC 1.9*  --   --  10.5  --   --  12.9*  CREATININE 6.98*  --   --  7.45*  --   --  6.84*  LATICACIDVEN  --  2.8* 3.7*  --  2.9* 3.2*  --     Estimated Creatinine Clearance: 6.8 mL/min (A) (by C-G formula based on SCr of 6.84 mg/dL (H)).    Allergies  Allergen Reactions   Codeine Nausea And Vomiting   Penicillins Nausea And Vomiting    Has patient had a PCN reaction causing immediate rash, facial/tongue/throat swelling, SOB or lightheadedness with hypotension: no Has patient had a PCN reaction causing severe rash involving mucus membranes or skin necrosis:no Has patient had a PCN reaction that required hospitalization no Has patient had a PCN reaction occurring within the last 10 years: no If all of the above answers are NO, then may proceed with Cephalosporin use.    Microbiology results: Peritoneal fluid 2/7 Kleb pna , 645 nuc count 85% neutrophil   Thank you for allowing pharmacy to be a part of this patient's care.  Rankin Sams, PharmD, BCPS, BCCCP Clinical Pharmacist

## 2023-12-22 NOTE — Progress Notes (Signed)
 Pt complaining of shortness of breath and pain in his abdomen during the dwelling phase of peritoneal dialysis. Contacted HD. HD RN assessed machine and patient. Patient on cycle 5/6 and wishes to finish dialysis. MD notified.  Makaylynn Bonillas C Trenden Hazelrigg

## 2023-12-22 NOTE — Progress Notes (Signed)
 [facility] Progress Note  Subjective: had abd pain during dwells overnight, otherwise still feeling well this am. PD fluid grew klebsiella pneumoniae.   Vitals:   12/22/23 1230 12/22/23 1300 12/22/23 1330 12/22/23 1400  BP: 102/78 97/67 99/73  95/67  Pulse: (!) 110 (!) 104 96 98  Resp: (!) 31 10 10 11   Temp:      TempSrc:      SpO2: 100% 100% 100% 100%  Weight:      Height:        Started PD in July 2021 after PD cath placed by Dr Cara.  01/2023 - RSV infection w/ fall, [facility] admit 02/2023 - weak and tired 06/2023 - recent dx afib, switched norvasc  to coreg  for rate control 08/2023 - still weak, denied pt referral 09/2023 - balance issues 10/2023 - borderline clearance w/ PD, feels very poorly on 6 exchanges, short dwells of 1 hr 11/2023 - chronic back pain and progressive weakness, declining, will cont PD although not meeting kt/v for clearance, discussed iHD pt declined and discussed palliative/ hospice care. Pt said when he fails PD he will do hospice   Exam: Gen alert, no distress, looks good Sclera anicteric, throat clear  No jvd or bruits Chest clear bilat to bases, no rales/ wheezing RRR no RG Abd mild-mod abd tenderness, improving, no mass or ascites +bs Ext no LE edema Neuro is alert, Ox 3 , nf     PD cath mid R abdomen - exit site is clean    Renal-related home meds: - norvasc  5 hs - auryxia  210 ac tid - rocaltrol  0.25 mcg po tts - lasix  80 qam - others: PPI, eliquis , zyloprim , prns    OP PD: nightly CCPD Dr Dolan  60.5kg   1h dwells x 6 overnight, fill vol 2.3 L - last HB  10.4 on 2/3, last mircera sq 75 mcg on 1/23       Assessment/ Plan: PD cath-related peritonitis - presented w/ abd pain/ N/V. ESRD on peritoneal dialysis since 2021. No prior peritonitis. Peritoneal fluid TNC was 645 ( < 50 is wnl) w/ +GNR's on gram stain. IV abx cefepime / flagyl / vanc were narrowed to IV zosyn . PD fluid cx is back = +klebs pna, will consult pharm for  change to IV fortaz  since this is likely what will be used in OP setting.  ESRD - on CCPD. Temp cath placed in case needs CRRT. Had PD last night, per PD RN we will do smaller dwells tonight due to pains he had during PD last night (lower 2.3 L --> 2.0 L).  BP - remains on levo 5 micrograms/min. Holding home bp lowering meds.  Anemia of esrd - Hb 11- 12 here, last esa on 1/23. No esa needs.  Secondary hyperparathyroidism - CCa in range. Cont binder w/ meals and po vdra on tts schedule.  GOC - pt is DNR. Pt's PD clearance is not great, has chronic fatigue related to this and /or other issues. Pt refuses HD and has stated that when the PD fails he will go to hospice.            Myer Fret MD  [facility] 12/22/2023, 2:03 PM  Recent Labs  Lab 12/21/23 0301 12/22/23 0825  HGB 9.9* 10.3*  ALBUMIN  1.6* 1.7*  CALCIUM 8.0* 8.2*  CREATININE 7.45* 6.84*  K 4.6 4.2   No results for input(s): IRON , TIBC, FERRITIN in the last 168 hours. Inpatient medications:  allopurinol   100 mg Oral Q M,W,F  apixaban   2.5 mg Oral BID   calcitRIOL   0.25 mcg Oral Q T,Th,Sat-1800   Chlorhexidine  Gluconate Cloth  6 each Topical Daily   ferric citrate   210 mg Oral TID WC   gentamicin  cream  1 Application Topical Daily   multivitamin  1 tablet Oral Daily   pantoprazole   80 mg Oral Daily   potassium chloride   20 mEq Oral Daily   sodium chloride  flush  10-40 mL Intracatheter Q12H    amiodarone  30 mg/hr (12/22/23 1357)   dialysis solution 1.5% low-MG/low-CA     norepinephrine  (LEVOPHED ) Adult infusion 4 mcg/min (12/22/23 0930)   piperacillin -tazobactam (ZOSYN )  IV 2.25 g (12/22/23 0835)   promethazine  (PHENERGAN ) injection (IM or IVPB) Stopped (12/21/23 2106)   sodium chloride      acetaminophen  **OR** acetaminophen , heparin  sodium (porcine) 3,000 Units in dialysis solution 1.5% low-MG/low-CA 6,000 mL dialysis solution, HYDROcodone -acetaminophen , HYDROmorphone  (DILAUDID ) injection, ondansetron  **OR**  ondansetron  (ZOFRAN ) IV, promethazine  (PHENERGAN ) injection (IM or IVPB), sodium chloride  flush

## 2023-12-23 DIAGNOSIS — T8571XD Infection and inflammatory reaction due to peritoneal dialysis catheter, subsequent encounter: Secondary | ICD-10-CM

## 2023-12-23 DIAGNOSIS — Z992 Dependence on renal dialysis: Secondary | ICD-10-CM | POA: Diagnosis not present

## 2023-12-23 DIAGNOSIS — N186 End stage renal disease: Secondary | ICD-10-CM | POA: Diagnosis not present

## 2023-12-23 DIAGNOSIS — R6521 Severe sepsis with septic shock: Secondary | ICD-10-CM | POA: Diagnosis not present

## 2023-12-23 DIAGNOSIS — I4891 Unspecified atrial fibrillation: Secondary | ICD-10-CM | POA: Diagnosis not present

## 2023-12-23 DIAGNOSIS — A419 Sepsis, unspecified organism: Secondary | ICD-10-CM | POA: Diagnosis not present

## 2023-12-23 LAB — CBC
HCT: 32.9 % — ABNORMAL LOW (ref 39.0–52.0)
Hemoglobin: 11 g/dL — ABNORMAL LOW (ref 13.0–17.0)
MCH: 32.1 pg (ref 26.0–34.0)
MCHC: 33.4 g/dL (ref 30.0–36.0)
MCV: 95.9 fL (ref 80.0–100.0)
Platelets: 207 10*3/uL (ref 150–400)
RBC: 3.43 MIL/uL — ABNORMAL LOW (ref 4.22–5.81)
RDW: 15.9 % — ABNORMAL HIGH (ref 11.5–15.5)
WBC: 10.5 10*3/uL (ref 4.0–10.5)
nRBC: 0 % (ref 0.0–0.2)

## 2023-12-23 LAB — BODY FLUID CELL COUNT WITH DIFFERENTIAL
Eos, Fluid: 0 %
Lymphs, Fluid: 15 %
Monocyte-Macrophage-Serous Fluid: 9 % — ABNORMAL LOW (ref 50–90)
Neutrophil Count, Fluid: 76 % — ABNORMAL HIGH (ref 0–25)
Total Nucleated Cell Count, Fluid: 55 uL (ref 0–1000)

## 2023-12-23 LAB — COMPREHENSIVE METABOLIC PANEL
ALT: 19 U/L (ref 0–44)
AST: 34 U/L (ref 15–41)
Albumin: 1.6 g/dL — ABNORMAL LOW (ref 3.5–5.0)
Alkaline Phosphatase: 56 U/L (ref 38–126)
Anion gap: 18 — ABNORMAL HIGH (ref 5–15)
BUN: 45 mg/dL — ABNORMAL HIGH (ref 8–23)
CO2: 16 mmol/L — ABNORMAL LOW (ref 22–32)
Calcium: 8.4 mg/dL — ABNORMAL LOW (ref 8.9–10.3)
Chloride: 89 mmol/L — ABNORMAL LOW (ref 98–111)
Creatinine, Ser: 6.77 mg/dL — ABNORMAL HIGH (ref 0.61–1.24)
GFR, Estimated: 7 mL/min — ABNORMAL LOW (ref >60)
Glucose, Bld: 137 mg/dL — ABNORMAL HIGH (ref 70–99)
Potassium: 4.7 mmol/L (ref 3.5–5.1)
Sodium: 123 mmol/L — ABNORMAL LOW (ref 135–145)
Total Bilirubin: 0.8 mg/dL (ref 0.0–1.2)
Total Protein: 4.5 g/dL — ABNORMAL LOW (ref 6.5–8.1)

## 2023-12-23 LAB — GLUCOSE, CAPILLARY: Glucose-Capillary: 60 mg/dL — ABNORMAL LOW (ref 70–99)

## 2023-12-23 LAB — PROCALCITONIN: Procalcitonin: 21.48 ng/mL

## 2023-12-23 LAB — MAGNESIUM: Magnesium: 2.3 mg/dL (ref 1.7–2.4)

## 2023-12-23 LAB — PATHOLOGIST SMEAR REVIEW

## 2023-12-23 MED ORDER — HEPARIN SODIUM (PORCINE) 1000 UNIT/ML IJ SOLN
INTRAPERITONEAL | Status: DC | PRN
  Filled 2023-12-23 (×2): qty 6000

## 2023-12-23 MED ORDER — CARVEDILOL 3.125 MG PO TABS
3.1250 mg | ORAL_TABLET | Freq: Two times a day (BID) | ORAL | Status: DC
  Administered 2023-12-23 (×2): 3.125 mg via ORAL
  Filled 2023-12-23 (×2): qty 1

## 2023-12-23 NOTE — Progress Notes (Signed)
   12/22/23 2115  Completion  Treatment Status Complete (not tolerated)  Procedure Comments  Peritoneal Dialysis Comments tx not completed as expected, pt complaining of not being able to breathe during dwell time.  Hand-off documentation  Hand-off Given Given to shift RN/LPN  Report given to (Full Name) Marci Krabbe, RN  Hand-off Received Received from Transfer Unit/facility  Report received from (Full Name) Ivana Ny, RN

## 2023-12-23 NOTE — Progress Notes (Addendum)
 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 Upon initial workup in ED, patient was hypotensive and in Afib RVR with concerns of peritonitis. Initial labs showing lactic acidosis, Cr of 6.98, INR of 1.6, WBC 1.9, and hgb 11.7. CT of abdomen pelvis- concern for mild wall thickening around the small bowel in LUQ and the ascending/descending colon, cirrhosis, and small volume of free air in upper abdomen due to PD, and a 3.1 cm infrarenal abdominal aortic aneurysm. Peritoneal culture obtained and was started on antibiotics, and cardizem  gtt was started for Afib RVR. Nephrology and Cardiology initially consulted with TRH to admit. Despite fluid resuscitation, patient remains hypotensive. PCCM consulted for refractory hypotension in setting of peritonitis.    Pertinent  Medical History   Past Medical History:  Diagnosis Date   Arthritis    Complication of anesthesia    ESRD on peritoneal dialysis (HCC)    Dr Edson Graces w/ [facility], started PD in 2021   Hard of hearing    Hypertension    Kidney stones    PONV (postoperative nausea and vomiting)      Significant Hospital Events: Including procedures, antibiotic start and stop dates in addition to other pertinent events   2/7 PCCM admit to ICU for septic shock, peritonitis, found to be in A-fib with RVR with lactic acidosis attempted IV amiodarone  and blood pressure dropped requiring norepinephrine .  Antibiotics initiated. 2/8 growing gram-negative rods in peritoneal washing, remains on norepinephrine , mental status improved, acid-base, sodium,  BUN/creatinine all a little worse reattempting PD. Did have some nausea during day   Interim History / Subjective:   Off norepinephrine  since yesterday evening.  No complaints.  Objective   Blood pressure 103/60, pulse 84, temperature 97.6 F (36.4 C), temperature source Oral, resp. rate (!) 24, height 5\' 8"  (1.727 m), weight 65.9 kg, SpO2 100%.        Intake/Output Summary (Last 24 hours) at 12/23/2023 0926 Last data filed at 12/23/2023 0900 Gross per 24 hour  Intake 1672.27 ml  Output --  Net 1672.27 ml   Filed Weights   12/20/23 1820 12/21/23 0500 12/22/23 0500  Weight: 60.2 kg 60.2 kg 65.9 kg    Examination: Blood pressure 103/60, pulse 84, temperature 97.6 F (36.4 C), temperature source Oral, resp. rate (!) 24, height 5\' 8"  (1.727 m), weight 65.9 kg, SpO2 100%. Gen:      No acute distress frail, elderly.  Hard of hearing HEENT:  EOMI, sclera anicteric Neck:     No masses; no thyromegaly Lungs:    Clear to auscultation bilaterally; normal respiratory effort CV:         Regular rate and rhythm; no murmurs Abd:      + bowel sounds; soft, non-tender; no palpable masses, no distension Ext:    No edema; adequate peripheral perfusion Skin:      Warm and dry; no rash Neuro: Awake, oriented  Lab/imaging reviewed Significant for sodium 123 BUN/creatinine 4/6.7 WBC 10.5, hemoglobin 11, platelets 207  Resolved Hospital Problem list   N/a Assessment & Plan:  Septic shock with lactic  acidosis secondary to SBP with chronic peritoneal catheter.  Currently growing abundant K Pneumoniae Plan Off pressors Continue ceftaz while we await sensitivities Holding Coreg , Lasix   ESRD Undergoes Peritoneal Dialysis-dumps fluid in bathroom shower  Plan Continues on peritoneal dialysis per nephrology He does not want to go on regular hemodialysis Discussed with nephrology.  Will remove temporary HD catheter  Fluid and electrolyte imbalance: Hypomagnesemia, hyponatremia   Replaced Plan Continue Peritoneal dialysis  Atrial fibrillation RVR, known history of A-fib on chronic Eliquis  Plan Continue telemetry Continue DOAC Discontinue amiodarone .  Start Coreg  as his blood pressure is stable  H/o cirrhosis  Plan Supportive care  Severe protein calorie malnutrition Plan Maximize nutrition  Anemia of chronic disease Plan Trend hemoglobin Trigger for transfusion is less than 7 or active bleeding with hemodynamic compromise Continuing his iron  replacement  Stable for transfer out of ICU  Best Practice (right click and "Reselect all SmartList Selections" daily)   Diet/type: Regular consistency (see orders) DVT prophylaxis DOAC Pressure ulcer(s): N/A GI prophylaxis: PPI Lines: N/A and Dialysis Catheter  HD cath (order removal) Foley:  N/A Code Status:  DNR Last date of multidisciplinary goals of care discussion [pt and family updated at beside]   The patient is critically ill with multiple organ system failure and requires high complexity decision making for assessment and support, frequent evaluation and titration of therapies, advanced monitoring, review of radiographic studies and interpretation of complex data.   Critical Care Time devoted to patient care services, exclusive of separately billable procedures, described in this note is 35 minutes.   Lenore Moyano MD [facility] See Amion for pager  If no response to pager , please call [phone] until 7pm After 7:00 pm call Elink  [phone] 12/23/2023, 9:40 AM

## 2023-12-23 NOTE — Plan of Care (Signed)
  Problem: Education: Goal: Knowledge of General Education information will improve Description: Including pain rating scale, medication(s)/side effects and non-pharmacologic comfort measures Outcome: Progressing   Problem: Clinical Measurements: Goal: Will remain free from infection Outcome: Progressing Goal: Diagnostic test results will improve Outcome: Progressing   Problem: Pain Managment: Goal: General experience of comfort will improve and/or be controlled Outcome: Progressing   Problem: Nutrition: Goal: Adequate nutrition will be maintained Outcome: Not Progressing   Problem: Elimination: Goal: Will not experience complications related to bowel motility Outcome: Not Progressing

## 2023-12-23 NOTE — Progress Notes (Signed)
   12/23/23 1040  Peritoneal Catheter Right lower abdomen  Placement Date: (c) 01/18/23   Catheter Location: Right lower abdomen  Site Assessment Clean, Dry, Intact  Drainage Description None  Catheter status Deaccessed  Dressing Status Clean, Dry, Intact  Completion  Treatment Status Complete  Initial Drain Volume 2130  Average Dwell Time-Hour(s) 1  Average Dwell Time-Min(s) 0  Average Drain Time 19  Total Therapy Volume 9598  Total Therapy Time-Hour(s) 11  Total Therapy Time-Min(s) 49  Weight after Drain 151 lb 3.8 oz (68.6 kg)  Effluent Appearance Cloudy;Yellow  Cell Count on Daytime Exchange N/A  Fluid Balance - CCPD  Total UF (- value on cycler, pt gain) -106 mL  Procedure Comments  Tolerated treatment well? Yes  Peritoneal Dialysis Comments Pt. voicwd no complaints and son at bedside  Hand-off documentation  Hand-off Given Given to shift RN/LPN  Report given to (Full Name) Marvell Slider RN  Hand-off Received Received from shift RN/LPN  Report received from (Full Name) Ella Gun RN   PD post treatment note  PD treatment completed. Patient tolerated treatment well. PD effluent is clear. No specimen collected.  PD exit site clean, dry and intact. Patient is awake, oriented and in no acute distress.  Report given to bedside nurse.   Post treatment VS: See Flowsheet  Total UF removed:  -106  Post treatment weight: 68.6 kg

## 2023-12-23 NOTE — Progress Notes (Signed)
 [facility] Progress Note  Subjective:  Seen on PD, doing 1.2L, dwells x8 because of discomfort with bigger fill volumesx Tolerating well so far, but has some nausea Family at bedside asnwered questions On RA, not on pressors SNa is 123, declined  Vitals:   12/23/23 0545 12/23/23 0600 12/23/23 0700 12/23/23 0736  BP: 105/71 (!) 88/71 117/88   Pulse: 87 89 87   Resp: 18 10 11    Temp:    97.6 F (36.4 C)  TempSrc:    Oral  SpO2: 100% 100% 100%   Weight:      Height:        Started PD in July 2021 after PD cath placed by Dr Gordon Latus.  01/2023 - RSV infection w/ fall, [facility] admit 02/2023 - weak and tired 06/2023 - recent dx afib, switched norvasc  to coreg  for rate control 08/2023 - still weak, denied pt referral 09/2023 - balance issues 10/2023 - borderline clearance w/ PD, feels very poorly on 6 exchanges, short dwells of 1 hr 11/2023 - chronic back pain and progressive weakness, declining, will cont PD although not meeting kt/v for clearance, discussed iHD pt declined and discussed palliative/ hospice care. Pt said when he fails PD he will do hospice   Exam: Gen alert, no distress, awake, engaging Sclera anicteric, throat clear  No jvd or bruits Chest clear bilat to bases, no rales/ wheezing RRR no RG Abd mild-mod abd tenderness, improving, no mass or ascites +bs Ext no LE edema Neuro is alert, Ox 3 , nf     PD cath mid R abdomen     Renal-related home meds: - norvasc  5 hs - auryxia  210 ac tid - rocaltrol  0.25 mcg po tts - lasix  80 qam - others: PPI, eliquis , zyloprim , prns    OP PD: nightly CCPD Dr Edson Graces  60.5kg   1h dwells x 6 overnight, fill vol 2.3 L - last HB  10.4 on 2/3, last mircera sq 75 mcg on 1/23       Assessment/ Plan: Klebsiella pneumoniae PD cath-related peritonitis - presented w/ abd pain/ N/V. ESRD on peritoneal dialysis since 2021.  Intitial Peritoneal fluid TNC 12/20/23 was 645w/ +GNR's on gram stain.  IV abx cefepime / flagyl /  vanc were narrowed to IV zosyn .  Rpt cell coutn ordered for today CDont 1.2L x8, next cycle, this time use mix 1.5% and 2.5% because of hypoNa Progressive hyponatremia Need to limit free water intake and deed more U as above; Fluid restrict to 1L/d ESRD - on CCPD. Temp cath placed in case needs CRRT. As above BP - Off vasopressors.  Holding home BP meds Anemia of esrd - Hb 11- 12 here, last esa on 1/23. No esa needs.  Secondary hyperparathyroidism - CCa in range. Cont binder w/ meals and po vdra on tts schedule.  GOC - pt is DNR. Pt's PD clearance is not great, has chronic fatigue related to this and /or other issues. Pt refuses HD and has stated that when the PD fails he will "go to hospice".        Charletta Cons, MD  12/23/2023, 8:34 AM  Recent Labs  Lab 12/22/23 0825 12/23/23 0242 12/23/23 0444  HGB 10.3*  --  11.0*  ALBUMIN  1.7* 1.6*  --   CALCIUM 8.2* 8.4*  --   CREATININE 6.84* 6.77*  --   K 4.2 4.7  --    No results for input(s): "IRON ", "TIBC", "FERRITIN" in the last 168 hours. Inpatient  medications:  allopurinol   100 mg Oral Q M,W,F   apixaban   2.5 mg Oral BID   calcitRIOL   0.25 mcg Oral Q T,Th,Sat-1800   Chlorhexidine  Gluconate Cloth  6 each Topical Daily   feeding supplement  237 mL Oral BID BM   ferric citrate   210 mg Oral TID WC   gentamicin  cream  1 Application Topical Daily   midodrine   5 mg Oral Q8H   multivitamin  1 tablet Oral Daily   pantoprazole   80 mg Oral Daily   potassium chloride   20 mEq Oral Daily   sodium chloride  flush  10-40 mL Intracatheter Q12H    amiodarone  30 mg/hr (12/23/23 0700)   cefTAZidime  (FORTAZ )  IV Stopped (12/22/23 1513)   dialysis solution 1.5% low-MG/low-CA     norepinephrine  (LEVOPHED ) Adult infusion Stopped (12/22/23 1800)   promethazine  (PHENERGAN ) injection (IM or IVPB) Stopped (12/21/23 2106)   sodium chloride      acetaminophen  **OR** acetaminophen , heparin  sodium (porcine) 1,000 Units in dialysis solution 1.5%  low-MG/low-CA 2,000 mL dialysis solution, heparin  sodium (porcine) 3,000 Units in dialysis solution 1.5% low-MG/low-CA 6,000 mL dialysis solution, HYDROcodone -acetaminophen , HYDROmorphone  (DILAUDID ) injection, metoCLOPramide  (REGLAN ) injection, ondansetron  **OR** ondansetron  (ZOFRAN ) IV, promethazine  (PHENERGAN ) injection (IM or IVPB), sodium chloride  flush

## 2023-12-23 NOTE — Progress Notes (Signed)
 NEW ORDER    12/22/23 2200  Peritoneal Catheter Right lower abdomen  Placement Date: (c) 01/18/23   Catheter Location: Right lower abdomen  Site Assessment Clean, Dry, Intact  Drainage Description None  Catheter status Deaccessed  Dressing Gauze/Drain sponge  Dressing Status Clean, Dry, Intact  Dressing Intervention Dressing changed  Cycler Setup  Total Number of Night Cycles  (8)  Night Fill Volume 1200  Dianeal  Solution Dextrose  1.5% in 6000 mL Low Cal/Low Mag  Dianeal  Additive Heparin   Night Dwell Time per Cycle - Hour(s) 1  Night Dwell Time per Cycle - Minute(s) 0  Night Time Therapy - Minute(s) 32  Night Time Therapy - Hour(s) 10  Minimum Initial Drain Volume 0  Maximum Peritoneal Volume 1800  Night/Total Therapy Volume 9600  Day Exchange No  Hand-off documentation  Hand-off Given Given to Transfer Unit/facility  Report given to (Full Name) Marci Krabbe, RN  Hand-off Received Received from Transfer Unit/facility  Report received from (Full Name) Ivana Pat, RN

## 2023-12-23 NOTE — Evaluation (Signed)
 Occupational Therapy Evaluation Patient Details Name: Shaun Murillo MRN: [id number] DOB: September 10, 1932 Today's Date: 12/23/2023   History of Present Illness 89yr old male who presented to [facility] ED 2/7 due to a fall, and abdominal pain with nausea with vomiting. Admitted to ICU for septic shock, peritonitis, found to be in A-fib with RVR. PMHx: ESRD (on peritoneal dialysis), GERD, Afib on eliquis , gout, cirrhosis and right parotid mass s/p resection-2017   Clinical Impression   Pt reports ind at baseline with ADLs and uses rollator for mobility, this past week pt has needed more assist for ADLs and med mgmt/PD lately. Pt has 24/7 assist from son's at home. Pt currently needing min-max A for ADLs, CGA for bed mobility and min -mod+2 for transfers with RW. Pt keeping trunk flexed, cues to remain upright for pericare and with ambulation from bathroom. Pt presenting with impairments listed below, will follow acutely. Patient will benefit from continued inpatient follow up therapy, <3 hours/day to maximize safety/ind with ADL/functional mobility.        If plan is discharge home, recommend the following: A lot of help with walking and/or transfers;A lot of help with bathing/dressing/bathroom;Assistance with cooking/housework;Direct supervision/assist for medications management;Direct supervision/assist for financial management;Assist for transportation;Help with stairs or ramp for entrance    Functional Status Assessment  Patient has had a recent decline in their functional status and demonstrates the ability to make significant improvements in function in a reasonable and predictable amount of time.  Equipment Recommendations  BSC/3in1    Recommendations for Other Services PT consult     Precautions / Restrictions Precautions Precautions: Fall Precaution Comments: monitor BP/orthostatics; hx of vertigo Restrictions Weight Bearing Restrictions Per Provider Order: No      Mobility Bed  Mobility Overal bed mobility: Needs Assistance Bed Mobility: Supine to Sit     Supine to sit: Contact guard     General bed mobility comments: CGA to rise to EOB, slower, a bit effortful. Stable upon sitting but lightheaded.    Transfers Overall transfer level: Needs assistance Equipment used: Rolling walker (2 wheels) Transfers: Sit to/from Stand Sit to Stand: Min assist, Mod assist, +2 physical assistance                  Balance Overall balance assessment: Needs assistance Sitting-balance support: No upper extremity supported, Feet supported Sitting balance-Leahy Scale: Fair     Standing balance support: Bilateral upper extremity supported, Reliant on assistive device for balance Standing balance-Leahy Scale: Poor                             ADL either performed or assessed with clinical judgement   ADL Overall ADL's : Needs assistance/impaired Eating/Feeding: Set up;Sitting   Grooming: Minimal assistance;Sitting;Standing   Upper Body Bathing: Moderate assistance   Lower Body Bathing: Moderate assistance   Upper Body Dressing : Minimal assistance   Lower Body Dressing: Moderate assistance   Toilet Transfer: Moderate assistance;Minimal assistance   Toileting- Clothing Manipulation and Hygiene: Maximal assistance;Sit to/from stand       Functional mobility during ADLs: Moderate assistance;Minimal assistance;Rolling walker (2 wheels)       Vision   Vision Assessment?: No apparent visual deficits     Perception Perception: Not tested       Praxis Praxis: Not tested       Pertinent Vitals/Pain Pain Assessment Pain Assessment: No/denies pain     Extremity/Trunk Assessment Upper Extremity Assessment Upper Extremity  Assessment: Generalized weakness   Lower Extremity Assessment Lower Extremity Assessment: Generalized weakness   Cervical / Trunk Assessment Cervical / Trunk Assessment: Normal   Communication  Communication Communication: Hearing impairment (hearing aide in L ear)   Cognition Arousal: Alert Behavior During Therapy: WFL for tasks assessed/performed Overall Cognitive Status: Within Functional Limits for tasks assessed                                       General Comments  VSS on RA    Exercises     Shoulder Instructions      Home Living Family/patient expects to be discharged to:: Private residence Living Arrangements: Children Available Help at Discharge: Family;Available 24 hours/day Type of Home: House Home Access: Stairs to enter [employer] of Steps: 3 Entrance Stairs-Rails: None Home Layout: One level     Bathroom Shower/Tub: Walk-in shower         Home Equipment: Educational psychologist (4 wheels)          Prior Functioning/Environment               Mobility Comments: Using rollator PTA. Has gotten progressively weak, now falling. ADLs Comments: Assisted with ADLs by family. Has become more dependent recently. Family havinga  difficult time caring for him, looking at getting an aide.        OT Problem List: Decreased strength;Decreased range of motion;Decreased activity tolerance;Impaired balance (sitting and/or standing);Decreased cognition;Decreased coordination;Decreased safety awareness;Cardiopulmonary status limiting activity      OT Treatment/Interventions: Self-care/ADL training;Therapeutic exercise;Energy conservation;DME and/or AE instruction;Therapeutic activities;Patient/family education;Balance training    OT Goals(Current goals can be found in the care plan section) Acute Rehab OT Goals Patient Stated Goal: none stated OT Goal Formulation: With patient Time For Goal Achievement: 01/06/24 Potential to Achieve Goals: Good ADL Goals Pt Will Perform Upper Body Dressing: with supervision;sitting Pt Will Perform Lower Body Dressing: with supervision;sit to/from stand;sitting/lateral leans Pt Will Transfer  to Toilet: with supervision;ambulating;regular height toilet Pt Will Perform Tub/Shower Transfer: Shower transfer;with supervision;ambulating;shower seat;rolling walker  OT Frequency: Min 1X/week    Co-evaluation              AM-PAC OT "6 Clicks" Daily Activity     Outcome Measure Help from another person eating meals?: A Little Help from another person taking care of personal grooming?: A Little Help from another person toileting, which includes using toliet, bedpan, or urinal?: A Lot Help from another person bathing (including washing, rinsing, drying)?: A Lot Help from another person to put on and taking off regular upper body clothing?: A Little Help from another person to put on and taking off regular lower body clothing?: A Lot 6 Click Score: 15   End of Session Equipment Utilized During Treatment: Rolling walker (2 wheels) Nurse Communication: Mobility status  Activity Tolerance: Patient tolerated treatment well Patient left: in bed;with call bell/phone within reach;with family/visitor present;with nursing/sitter in room  OT Visit Diagnosis: Unsteadiness on feet (R26.81);Other abnormalities of gait and mobility (R26.89);Muscle weakness (generalized) (M62.81)                Time: 9563-8756 OT Time Calculation (min): 25 min Charges:  OT General Charges $OT Visit: 1 Visit OT Evaluation $OT Eval Moderate Complexity: 1 Mod OT Treatments $Self Care/Home Management : 8-22 mins  Bryor Rami K, OTD, OTR/L SecureChat Preferred Acute Rehab [phone]   Antionette Kirks 12/23/2023,  1:18 PM

## 2023-12-23 NOTE — Progress Notes (Signed)
 CCPD tx initiated per order---cell count waws drawn and processed in lab and in Epic from this am-- VS are WNL Pt is asymptomatic--no abdominal pain assessed-- with first drain and fill Dressing change to access site per sterile protocol---no s/s of any redness or drainage---dsd applied

## 2023-12-23 NOTE — Progress Notes (Signed)
 PD tx initation note:   Pre TX VS:   Pre TX weight:   PD treatment initiated via aseptic technique. Consent signed and in chart. Patient is alert and oriented. No complaints of pain. No specimen collected. PD exit site clean, dry and intact. Gentamycin and new dressing applied. Bedside RN educated on PD machine and how to contact tech support when PD machine alarms.    12/22/23 2057  Peritoneal Catheter Right lower abdomen  Placement Date: (c) 01/18/23   Catheter Location: Right lower abdomen  Site Assessment Clean, Dry, Intact  Drainage Description None  Catheter status Deaccessed  Dressing Gauze/Drain sponge  Dressing Status Clean, Dry, Intact  Dressing Intervention Removed  Cycler Setup  Total Number of Night Cycles 6  Night Fill Volume 2000  Dianeal  Solution Dextrose  1.5% in 6000 mL Low Cal/Low Mag  Dianeal  Additive Heparin   Night Dwell Time per Cycle - Hour(s) 1  Night Dwell Time per Cycle - Minute(s) 0  Night Time Therapy - Minute(s) 55  Night Time Therapy - Hour(s) 11  Minimum Initial Drain Volume 0  Maximum Peritoneal Volume 3000  Night/Total Therapy Volume 87999  Day Exchange No  Hand-off documentation  Hand-off Given Given to shift RN/LPN  Report given to (Full Name) Marci Krabbe, RN  Hand-off Received Received from shift RN/LPN  Report received from (Full Name) Coletta Lockner, rn

## 2023-12-23 NOTE — Progress Notes (Signed)
 Rounding Note    Patient Name: Shaun Murillo Date of Encounter: 12/23/2023  [facility] Cardiologist: Deva[facility], MD   Subjective   His concern today is that he cannot keep down any food; family at bedside endorses that he vomits up shortly after eating. He is nauseated with poor appetite. Without prompting, he states that he is "ready to meet my maker" if it is his time. No chest pain, denies shortness of breath. Abdominal pain continues.  Inpatient Medications    Scheduled Meds:  allopurinol   100 mg Oral Q M,W,F   apixaban   2.5 mg Oral BID   calcitRIOL   0.25 mcg Oral Q T,Th,Sat-1800   carvedilol   3.125 mg Oral BID WC   Chlorhexidine  Gluconate Cloth  6 each Topical Daily   feeding supplement  237 mL Oral BID BM   ferric citrate   210 mg Oral TID WC   gentamicin  cream  1 Application Topical Daily   multivitamin  1 tablet Oral Daily   pantoprazole   80 mg Oral Daily   potassium chloride   20 mEq Oral Daily   sodium chloride  flush  10-40 mL Intracatheter Q12H   Continuous Infusions:  cefTAZidime  (FORTAZ )  IV Stopped (12/23/23 1009)   dialysis solution 1.5% low-MG/low-CA     promethazine  (PHENERGAN ) injection (IM or IVPB) Stopped (12/21/23 2106)   sodium chloride      PRN Meds: acetaminophen  **OR** acetaminophen , heparin  sodium (porcine) 1,000 Units in dialysis solution 1.5% low-MG/low-CA 2,000 mL dialysis solution, heparin  sodium (porcine) 3,000 Units in dialysis solution 1.5% low-MG/low-CA 6,000 mL dialysis solution, heparin  sodium (porcine) 3,000 Units in dialysis solution 2.5% low-MG/low-CA 6,000 mL dialysis solution, HYDROcodone -acetaminophen , HYDROmorphone  (DILAUDID ) injection, metoCLOPramide  (REGLAN ) injection, ondansetron  **OR** ondansetron  (ZOFRAN ) IV, promethazine  (PHENERGAN ) injection (IM or IVPB), sodium chloride  flush   Vital Signs    Vitals:   12/23/23 1500 12/23/23 1600 12/23/23 1700 12/23/23 1809  BP: 98/83 105/79 101/74 107/73  Pulse: 79 88 90 90   Resp: 19 13 16    Temp: 98 F (36.7 C)     TempSrc: Oral     SpO2: 100% 100% 100%   Weight:      Height:        Intake/Output Summary (Last 24 hours) at 12/23/2023 1821 Last data filed at 12/23/2023 1200 Gross per 24 hour  Intake 506.46 ml  Output --  Net 506.46 ml      12/23/2023   10:40 AM 12/22/2023    5:00 AM 12/21/2023    5:00 AM  Last 3 Weights  Weight (lbs) 151 lb 3.8 oz  145 lb 4.5 oz 132 lb 11.5 oz  Weight (kg) 68.6 kg  65.9 kg 60.2 kg     Significant value      Telemetry    Rate controlled atrial fibrillation - Personally Reviewed  Physical Exam   GEN: frail, thin appearing Neck: No JVD Cardiac: irregularly irregular, no murmurs, rubs, or gallops.  Respiratory: Clear to auscultation bilaterally. GI: mildly tender to palpation MS: No edema; No deformity. Neuro:  Nonfocal  Psych: Normal affect   New pertinent results (labs, ECG, imaging, cardiac studies)    ABI as below  Patient Profile     89 y.o. male with past medical history of ESRD on peritoneal dialysis presenting with dizziness/falls and abdominal pain.  Found to be in atrial fibrillation with RVR, neurology consulted for further assistance.  Assessment & Plan    atrial fibrillation with RVR -in the setting of septic shock from klebsiella  peritonitis -given hypotension on presentation, started on IV amiodarone  initially -Continue Eliquis  2.5 mg twice a day, dose reduced given age, weight and renal function -Was on carvedilol  as an outpatient. As his blood pressure improves, ok to stop amiodarone  and restart carvedilol  -Echo done about a year ago showed EF 55 to 60%.  There was no severe valve disease at that time.  -aim for rate control given his acute illness and comorbidities. He denies cardiac symptoms.  ESRD on peritoneal dialysis since 2021 Klebsiella pneumonia peritoneal dialysis catheter related peritonitis -Management per nephrology and primary team -Now off pressors and  midodrine   Lower extremity wounds -ABIs performed yesterday show ankle-brachial indices are normal bilaterally, but toe waveforms are absent bilaterally.  This may be limited due to being in RVR at the time of evaluation.  Normal ankle-brachial index suggests no severe large vessel disease.  Aortic atherosclerosis/ulceration  He is DNR. Worsening hyponatremia, with unintentional weight loss and poor appetite at home concerning for failure to thrive. Per nephrology noted, patient not interested in HD, when PD no longer effective, would be interested in hospice. Defer to primary team on goals of care management.  Cardiology will sign off at this time. We will arrange for outpatient follow up at the [facility] office. Continue apixaban  and carvedilol  as above. Please do not hesitate to contact us  with questions or concerns.     Signed, Sheryle Donning, MD  12/23/2023, 6:21 PM

## 2023-12-23 NOTE — Progress Notes (Signed)
   12/22/23 2210  Peritoneal Catheter Right lower abdomen  Placement Date: (c) 01/18/23   Catheter Location: Right lower abdomen  Catheter status Accessed  Completion  Treatment Status Started (new order:  Tolerated well.  will continue to monitor.)  Hand-off documentation  Hand-off Given Given to shift RN/LPN  Report given to (Full Name) Marci Krabbe, RN  Hand-off Received Received from shift RN/LPN  Report received from (Full Name) Kendrell Lottman, RN

## 2023-12-23 NOTE — TOC CM/SW Note (Addendum)
 Transition of Care [facility]) - Inpatient Brief Assessment   Patient Details  Name: Shaun Murillo MRN: [id number] Date of Birth: [birth year]  [facility]) CM/SW Contact:    Tom-Johnson, Meshelle Holness Daphne, RN Phone Number: 12/23/2023, 3:32 PM   Clinical Narrative:  Patient presented to the ED with Abdominal Pain, N/V and a Fall at home. Patient has hx of ESRD, on daily Peritoneal Dialysis at home, Rt Parotid Mass s/p Resection in 2017, A-Fib, on Eliquis . Patient admitted with Sepsis 2/2 Peritonitis, on IV abx. Nephrology following for inpatient Dialysis.   From home alone, has three sons that lives closeby and take turns to care for patient 24 hrs. Does not use home O2, currently on 2L O2. Has a walk-in shower with shower seat and a rollator.  PCP is Benedetto Brady, MD and uses [facility] in [place] and also [facility].   PT recommended HHPT, OT recommended SNF.  CM spoke with patient and two sons, Berta Brittle and Mont Antis at bedside with OT present. Both sons voiced their concerns about caring for patient at home at discharge. States patient is too weak and off his baseline and they will not be able to care for him in current condition.  Requesting Rehab prior returning home. Patient to continue to work with Therapy to see if any improvement.   Patient not Medically ready for discharge.  CM will continue to follow as patient progresses with care towards discharge.              Transition of Care Asessment: Insurance and Status: Insurance coverage has been reviewed Patient has primary care physician: Yes Home environment has been reviewed: Yes Prior level of function:: Modified Independent Prior/Current Home Services: No current home services Social Drivers of Health Review: SDOH reviewed no interventions necessary Readmission risk has been reviewed: Yes Transition of care needs: transition of care needs identified, TOC will continue to follow

## 2023-12-24 DIAGNOSIS — T8571XA Infection and inflammatory reaction due to peritoneal dialysis catheter, initial encounter: Secondary | ICD-10-CM | POA: Diagnosis not present

## 2023-12-24 LAB — COMPREHENSIVE METABOLIC PANEL
ALT: 20 U/L (ref 0–44)
AST: 21 U/L (ref 15–41)
Albumin: 1.6 g/dL — ABNORMAL LOW (ref 3.5–5.0)
Alkaline Phosphatase: 56 U/L (ref 38–126)
Anion gap: 17 — ABNORMAL HIGH (ref 5–15)
BUN: 42 mg/dL — ABNORMAL HIGH (ref 8–23)
CO2: 21 mmol/L — ABNORMAL LOW (ref 22–32)
Calcium: 8.4 mg/dL — ABNORMAL LOW (ref 8.9–10.3)
Chloride: 90 mmol/L — ABNORMAL LOW (ref 98–111)
Creatinine, Ser: 6.47 mg/dL — ABNORMAL HIGH (ref 0.61–1.24)
GFR, Estimated: 8 mL/min — ABNORMAL LOW (ref >60)
Glucose, Bld: 140 mg/dL — ABNORMAL HIGH (ref 70–99)
Potassium: 3.8 mmol/L (ref 3.5–5.1)
Sodium: 128 mmol/L — ABNORMAL LOW (ref 135–145)
Total Bilirubin: 0.3 mg/dL (ref 0.0–1.2)
Total Protein: 4.2 g/dL — ABNORMAL LOW (ref 6.5–8.1)

## 2023-12-24 LAB — CBC
HCT: 27.6 % — ABNORMAL LOW (ref 39.0–52.0)
Hemoglobin: 9.4 g/dL — ABNORMAL LOW (ref 13.0–17.0)
MCH: 32.4 pg (ref 26.0–34.0)
MCHC: 34.1 g/dL (ref 30.0–36.0)
MCV: 95.2 fL (ref 80.0–100.0)
Platelets: 184 10*3/uL (ref 150–400)
RBC: 2.9 MIL/uL — ABNORMAL LOW (ref 4.22–5.81)
RDW: 15.9 % — ABNORMAL HIGH (ref 11.5–15.5)
WBC: 8 10*3/uL (ref 4.0–10.5)
nRBC: 0 % (ref 0.0–0.2)

## 2023-12-24 LAB — MAGNESIUM: Magnesium: 2.1 mg/dL (ref 1.7–2.4)

## 2023-12-24 LAB — PROCALCITONIN: Procalcitonin: 16.71 ng/mL

## 2023-12-24 LAB — PHOSPHORUS: Phosphorus: 4.3 mg/dL (ref 2.5–4.6)

## 2023-12-24 MED ORDER — ALBUMIN HUMAN 25 % IV SOLN
12.5000 g | Freq: Four times a day (QID) | INTRAVENOUS | Status: AC
  Administered 2023-12-24 (×4): 12.5 g via INTRAVENOUS
  Filled 2023-12-24 (×4): qty 50

## 2023-12-24 MED ORDER — MIDODRINE HCL 5 MG PO TABS: 5.0000 mg | ORAL_TABLET | Freq: Three times a day (TID) | ORAL | Status: DC

## 2023-12-24 MED ORDER — DELFLEX-LC/2.5% DEXTROSE 394 MOSM/L IP SOLN: INTRAPERITONEAL | Status: DC

## 2023-12-24 MED ORDER — DELFLEX-LC/1.5% DEXTROSE 344 MOSM/L IP SOLN: INTRAPERITONEAL | Status: DC

## 2023-12-24 MED ORDER — GENTAMICIN SULFATE 0.1 % EX CREA
1.0000 | TOPICAL_CREAM | Freq: Every day | CUTANEOUS | Status: DC
Start: 2023-12-24 — End: 2023-12-26
  Administered 2023-12-24 – 2023-12-25 (×2): 1 via TOPICAL
  Filled 2023-12-24: qty 15

## 2023-12-24 NOTE — Progress Notes (Signed)
Pharmacy Antibiotic Note  Shaun Murillo is a 89 y.o. male for which pharmacy has been consulted for vancomycin and zosyn dosing for  Peritoneal dialysis peritonitis .  WBC 12.9; LA 3.2; T 98 Peritoneal Cx returned positive with Klebsiella pneumonia sensitive to Ceftazidime    Plan: Continue ceftazidime IV 1 gram daily while inpatient  F/u nephrology plans for d/c   Height: 5\' 8"  (172.7 cm) Weight: 63.5 kg (139 lb 15.9 oz) IBW/kg (Calculated) : 68.4  Temp (24hrs), Avg:97.9 F (36.6 C), Min:97 F (36.1 C), Max:98.4 F (36.9 C)  Recent Labs  Lab 12/20/23 0656 12/20/23 0853 12/20/23 1018 12/21/23 0301 12/21/23 1210 12/21/23 2006 12/22/23 0825 12/23/23 0242 12/23/23 0444 12/24/23 0415  WBC 1.9*  --   --  10.5  --   --  12.9*  --  10.5 8.0  CREATININE 6.98*  --   --  7.45*  --   --  6.84* 6.77*  --  6.47*  LATICACIDVEN  --  2.8* 3.7*  --  2.9* 3.2*  --   --   --   --     Estimated Creatinine Clearance: 7 mL/min (A) (by C-G formula based on SCr of 6.47 mg/dL (H)).    Allergies  Allergen Reactions   Codeine Nausea And Vomiting   Penicillins Nausea And Vomiting    Has patient had a PCN reaction causing immediate rash, facial/tongue/throat swelling, SOB or lightheadedness with hypotension: no Has patient had a PCN reaction causing severe rash involving mucus membranes or skin necrosis:no Has patient had a PCN reaction that required hospitalization no Has patient had a PCN reaction occurring within the last 10 years: no If all of the above answers are "NO", then may proceed with Cephalosporin use.    Microbiology results: Peritoneal fluid 2/7 Kleb pna , 645 nuc count 85% neutrophil   Thank you for allowing pharmacy to be a part of this patient's care.  Estill Batten, PharmD, BCCCP  Clinical Pharmacist

## 2023-12-24 NOTE — Hospital Course (Signed)
89yo with h/o ESRD on PD, afib on Eliquis, cirrhosis, and R parotid mass s/p remote resection who presented on 2/7 with a fall and abdominal pain. Evaluation showed sepsis due to peritonitis as well as hypotension and afib with RVR.  He was transferred to the ICU due to persistent hypotension despite fluid resuscitation and required pressors.  Cardiology and nephrology have also consulted.  He had recurrent hypotension overnight during PD on 2/11 and was given albumin.

## 2023-12-24 NOTE — Evaluation (Signed)
Clinical/Bedside Swallow Evaluation Patient Details  Name: KAZMIR OKI MRN: [id number] Date of Birth: 1932/01/07  Today's Date: 12/24/2023 Time: SLP Start Time (ACUTE ONLY): 1645 SLP Stop Time (ACUTE ONLY): 1700 SLP Time Calculation (min) (ACUTE ONLY): 15 min  Past Medical History:  Past Medical History:  Diagnosis Date   Arthritis    Complication of anesthesia    ESRD on peritoneal dialysis (HCC)    Dr Ronalee Belts w/ [facility], started PD in 2021   Hard of hearing    Hypertension    Kidney stones    PONV (postoperative nausea and vomiting)    Past Surgical History:  Past Surgical History:  Procedure Laterality Date   BACK SURGERY     COLONOSCOPY     ESOPHAGOGASTRODUODENOSCOPY     EYE SURGERY     cataracts bilateral   PAROTIDECTOMY Right 07/18/2016   Procedure: RIGHT SUPERFICIAL PAROTIDECTOMY WITH NERVE DISSECTION;  Surgeon: Christia Reading, MD;  Location: [facility] OR;  Service: ENT;  Laterality: Right;  RIGHT PAROTIDECTOMY WITH SELECTIVE NECK DISSECTION   SHOULDER SURGERY Right    HPI:  89yr old male who presented to [facility] ED 2/7 due to a fall, and abdominal pain with nausea with vomiting. Admitted to ICU for septic shock, peritonitis, found to be in A-fib with RVR. PMHx: ESRD (on peritoneal dialysis), GERD, Afib on eliquis, gout, cirrhosis and right parotid mass s/p resection-2017    Assessment / Plan / Recommendation  Clinical Impression  Per this evaluation and discussion with patient and son, it appears that his c/o are long-standing and have not gotten worse as of this admission. Patient has a h/o GERD and esophageal stricture s/p dilations with most recent being in 2004. Son reports that patient has had swallowing issues "all his life". Patient describes a globus sensation (points to mid-neck) which "doesn't happen all the time". When it does occur, it feels that liquids and/or solids are "sitting there" in throat and he will either eventually swallow it down or regurgitate it back out. Patient  does not feel that this is a problem he wants to address as he has been dealing with it for a long time and he isn't interested in any surgeries. SLP in agreement with patient and his son, as further swallow testing (like MBS) would likely not change his outcome. SLP advised patient to eat smaller amounts at a time (he already does this), avoid foods that are very hard/tough, etc and to cut or crush up medications to ease transit. No further skilled intervention warranted at this time. SLP Visit Diagnosis: Dysphagia, unspecified (R13.10)    Aspiration Risk  Mild aspiration risk    Diet Recommendation Regular;Thin liquid    Liquid Administration via: Cup;Straw Medication Administration: Other (Comment) (cut or crush large pills) Supervision: Patient able to self feed Compensations: Slow rate;Small sips/bites Postural Changes: Remain upright for at least 30 minutes after po intake;Seated upright at 90 degrees    Other  Recommendations Oral Care Recommendations: Oral care BID    Recommendations for follow up therapy are one component of a multi-disciplinary discharge planning process, led by the attending physician.  Recommendations may be updated based on patient status, additional functional criteria and insurance authorization.  Follow up Recommendations No SLP follow up      Assistance Recommended at Discharge    Functional Status Assessment Patient has not had a recent decline in their functional status  Frequency and Duration     N/A       Prognosis  N/A     Swallow Study   General Date of Onset: 12/24/23 HPI: 89yr old male who presented to [facility] ED 2/7 due to a fall, and abdominal pain with nausea with vomiting. Admitted to ICU for septic shock, peritonitis, found to be in A-fib with RVR. PMHx: ESRD (on peritoneal dialysis), GERD, Afib on eliquis, gout, cirrhosis and right parotid mass s/p resection-2017 Type of Study: Bedside Swallow Evaluation Previous Swallow Assessment:  none found Diet Prior to this Study: Regular;Thin liquids (Level 0) Temperature Spikes Noted: No Respiratory Status: Room air History of Recent Intubation: No Behavior/Cognition: Alert;Cooperative;Pleasant mood Oral Cavity Assessment: Within Functional Limits Oral Care Completed by SLP: No Oral Cavity - Dentition: Dentures, bottom;Dentures, top Vision: Functional for self-feeding Self-Feeding Abilities: Able to feed self Patient Positioning: Upright in bed Baseline Vocal Quality: Normal Volitional Cough: Strong Volitional Swallow: Able to elicit    Oral/Motor/Sensory Function Overall Oral Motor/Sensory Function: Within functional limits   Ice Chips     Thin Liquid Thin Liquid: Within functional limits Presentation: Straw;Self Fed    Nectar Thick     Honey Thick     Puree Puree: Not tested   Solid     Solid: Not tested      Angela Nevin, MA, CCC-SLP Speech Therapy

## 2023-12-24 NOTE — Progress Notes (Signed)
   12/24/23 0900  Peritoneal Catheter Right lower abdomen  Placement Date: (c) 01/18/23   Catheter Location: Right lower abdomen  Site Assessment Clean, Dry, Intact  Drainage Description None  Catheter status Deaccessed  Dressing Gauze/Drain sponge  Dressing Status Clean, Dry, Intact  Dressing Intervention Dressing reinforced  Completion  Treatment Status Complete  Initial Drain Volume 291  Average Dwell Time-Hour(s) 60  Average Drain Time 15  Total Therapy Volume 9601  Total Therapy Time-Hour(s) 11  Total Therapy Time-Min(s) 32  Weight after Drain 150 lb 5.7 oz (68.2 kg)  Effluent Appearance Yellow;Cloudy  Cell Count on Daytime Exchange N/A  Education / Care Plan  Dialysis Education Provided Yes  Hand-off documentation  Report given to (Full Name) Kavin Leech RN

## 2023-12-24 NOTE — Progress Notes (Signed)
Progress Note   Patient: Shaun Murillo ZOX:[id number] DOB: [birth year] DOA: 12/20/2023     4 DOS: the patient was seen and examined on 12/24/2023   Brief hospital course: 89yo with h/o ESRD on PD, afib on Eliquis, cirrhosis, and R parotid mass s/p remote resection who presented on 2/7 with a fall and abdominal pain. Evaluation showed sepsis due to peritonitis as well as hypotension and afib with RVR.  He was transferred to the ICU due to persistent hypotension despite fluid resuscitation and required pressors.  Cardiology and nephrology have also consulted.  He had recurrent hypotension overnight during PD on 2/11 and was given albumin.  Assessment and Plan:  Septic shock with lactic acidosis secondary to SBP with chronic peritoneal catheter from Klebsiella Pneumoniae Patient presented with infection + hypotension Required pressors, now off Continue ceftazidime per nephrology Holding Coreg, Lasix   ESRD Undergoes Peritoneal Dialysis Continues on peritoneal dialysis per nephrology He does not want to go on regular hemodialysis Nephrology is consulting   Removed temporary HD catheter He reports persistent early satiety that does not appear to have improved while infection is improving  Hypotension Due to sepsis on presentation Baseline pressure has improved, no longer needing pressors However, he continued to drop BPs overnight on 2/10-11 He improved some with albumin  Consider addition of midodrine If he is unable to tolerate PD due to recurrent hypotension, ongoing GOC conversation is likely needed   Hyponatremia  Stable, no current treatment needed   Atrial fibrillation RVR, known history of A-fib on chronic Eliquis Continue Eliquis Discontinued amiodarone   Started carvedilol but this is stopped due to hypotension overnight   H/o cirrhosis  Appears compensated Notmal LFTs   Nutrition status Nutrition Problem: Increased nutrient needs Etiology: acute  illness Signs/Symptoms: estimated needs Interventions: Ensure Enlive (each supplement provides 350kcal and 20 grams of protein), Liberalize Diet Markedly low albumin     Anemia of chronic disease Continue to follow with iron replacement Trigger for transfusion is less than 7 or active bleeding with hemodynamic compromise  DNR/GOC Confirmed on admission Palliative care consulted - he had an appointment to meet with [facility] palliative at home this coming Thursday If he is unable to tolerate ongoing PD, he has decided that he will transition to comfort      Consultants: PCCM Nephrology Cardiology PT OT Nutrition TOC team  Procedures: Peritoneal dialysis CVC insertion 2/7 ABI 2/8  Antibiotics: Cefepime x 1 Vancomycin x 1 Zosyn 2/8-9 Ceftazidime 2/9-  30 Day Unplanned Readmission Risk Score    Flowsheet Row ED to Hosp-Admission (Current) from 12/20/2023 in [place] [facility]  30 Day Unplanned Readmission Risk Score (%) 20.48 Filed at 12/24/2023 0401       This score is the patient's risk of an unplanned readmission within 30 days of being discharged (0 -100%). The score is based on dignosis, age, lab data, medications, orders, and past utilization.   Low:  0-14.9   Medium: 15-21.9   High: 22-29.9   Extreme: 30 and above           Subjective: Early satiety, feels nauseated and really unable to eat/drink.   Objective: Vitals:   12/24/23 0700 12/24/23 0720  BP: (!) 96/56   Pulse: 75   Resp: 18   Temp:  98 F (36.7 C)  SpO2: 100%     Intake/Output Summary (Last 24 hours) at 12/24/2023 0751 Last data filed at 12/24/2023 0700 Gross per 24 hour  Intake 291.43 ml  Output --  Net 291.43 ml   Filed Weights   12/21/23 0500 12/22/23 0500 12/23/23 1040  Weight: 60.2 kg 65.9 kg (S) 68.6 kg    Exam:  General:  Appears calm but uncomfortable due to nausea/abdominal fullness Eyes:   EOMI, normal lids, iris ENT:  hard of hearing  with hearing aids in place, grossly normal lips & tongue, mmm Neck:  no LAD, masses or thyromegaly Cardiovascular:  RRR, no m/r/g. No LE edema.  Respiratory:   CTA bilaterally with no wheezes/rales/rhonchi.  Normal respiratory effort. Abdomen:  soft, NT, ND; + L inguinal hernia Skin:  no rash or induration seen on limited exam Musculoskeletal:  grossly normal tone BUE/BLE, good ROM, no bony abnormality Psychiatric:  blunted mood and affect, speech fluent and appropriate, AOx3 Neurologic:  CN 2-12 grossly intact, moves all extremities in coordinated fashion  Data Reviewed: I have reviewed the patient's lab results since admission.  Pertinent labs for today include:   Na++ 128 Glucose 140 BUN 42/Creatinine 6.47/GFR 8 Anion gap 17 Albumin 1.6 Procalcitonin 16.71, improving WBC 8 Hgb 9.4     Family Communication: Son was present throughout evaluation  Disposition: Status is: Inpatient Remains inpatient appropriate because: ongoing management     Time spent: 50 minutes  Unresulted Labs (From admission, onward)     Start     Ordered   12/22/23 0500  Procalcitonin  Daily,   R     References:    Procalcitonin Lower Respiratory Tract Infection AND Sepsis Procalcitonin Algorithm  Question:  Specimen collection method  Answer:  Unit=Unit collect   12/21/23 0930   12/21/23 0500  Magnesium  Daily,   R      12/20/23 1729   12/21/23 0500  CBC  Daily,   R      12/20/23 1730   12/21/23 0500  Comprehensive metabolic panel  Daily,   R      12/20/23 1730   12/20/23 0735  Urinalysis, w/ Reflex to Culture (Infection Suspected) -Urine, Clean Catch  (Undifferentiated presentation (screening labs and basic nursing orders))  ONCE - URGENT,   URGENT       Question:  Specimen Source  Answer:  Urine, Clean Catch   12/20/23 0735             Author: Jonah Blue, MD 12/24/2023 7:51 AM  For on call review [email or web address].

## 2023-12-24 NOTE — Progress Notes (Signed)
[facility] Progress Note  Subjective:  Did overnight on PD, UF of 0.5 L. Less pain and discomfort, less nausea Soft blood pressures, low-dose carvedilol restarted by cardiology because of atrial fibrillation with RVR Repeat cell counts much improved Family at bedside asnwered questions Serum sodium improved to 128  Vitals:   12/24/23 0800 12/24/23 0830 12/24/23 0900 12/24/23 0930  BP: (!) 86/58 92/73 93/66  95/73  Pulse: 78 78 80 83  Resp: (!) 22 18 (!) 21 16  Temp:      TempSrc:      SpO2: 99% 100% 100% 100%  Weight:    63.5 kg  Height:    5\' 8"  (1.727 m)    Started PD in July 2021 after PD cath placed by Dr Buzzy Han.  01/2023 - RSV infection w/ fall, [facility] admit 02/2023 - weak and tired 06/2023 - recent dx afib, switched norvasc to coreg for rate control 08/2023 - still weak, denied pt referral 09/2023 - balance issues 10/2023 - borderline clearance w/ PD, feels very poorly on 6 exchanges, short dwells of 1 hr 11/2023 - chronic back pain and progressive weakness, declining, will cont PD although not meeting kt/v for clearance, discussed iHD pt declined and discussed palliative/ hospice care. Pt said when he fails PD he will do hospice   Exam: Gen alert, no distress, awake, engaging Sclera anicteric, throat clear  No jvd or bruits Chest clear bilat to bases, no rales/ wheezing RRR no RG Abd mild-mod abd tenderness, improving, no mass or ascites +bs Ext no LE edema Neuro is alert, Ox 3 , nf     PD cath mid R abdomen     Renal-related home meds: - norvasc 5 hs - auryxia 210 ac tid - rocaltrol 0.25 mcg po tts - lasix 80 qam - others: PPI, eliquis, zyloprim, prns    OP PD: nightly CCPD Dr Ronalee Belts  60.5kg   1h dwells x 6 overnight, fill vol 2.3 L - last HB  10.4 on 2/3, last mircera sq 75 mcg on 1/23       Assessment/ Plan: Klebsiella pneumoniae PD cath-related peritonitis - presented w/ abd pain/ N/V. ESRD on peritoneal dialysis since 2021.  Intitial  Peritoneal fluid TNC 12/20/23 was 645w/ +GNR's on gram stain.  Repeat 2/10 with 55 TNC's. Now on ceftazidime Moved towards outpatient PD Rx: 1.5L x6 tonight 50/50 mix 1.5% and 2.5% dextrose Progressive hyponatremia Need to limit free water intake and deed more U as above; Fluid restrict to 1L/d; improving ESRD - on CCPD. Temp cath placed in case needs CRRT. As above BP - Off vasopressors.  On low-dose beta-blocker for RVR.  Trend, might need midodrine Anemia of esrd -trend hemoglobin, 9.4, has been fairly stable. Secondary hyperparathyroidism - CCa in range.  Phosphorus at target.  Cont binder w/ meals and po vdra on tts schedule.  GOC - pt is DNR. Pt's PD clearance is not great, has chronic fatigue related to this and /or other issues. Pt refuses HD and has stated that when the PD fails he will "go to hospice".  Of note patient cannot continue PD if transitions to SNF at discharge.       Arita Miss, MD  12/24/2023, 10:30 AM  Recent Labs  Lab 12/23/23 0242 12/23/23 0444 12/24/23 0415  HGB  --  11.0* 9.4*  ALBUMIN 1.6*  --  1.6*  CALCIUM 8.4*  --  8.4*  PHOS  --   --  4.3  CREATININE 6.77*  --  6.47*  K 4.7  --  3.8   No results for input(s): "IRON", "TIBC", "FERRITIN" in the last 168 hours. Inpatient medications:  allopurinol  100 mg Oral Q M,W,F   apixaban  2.5 mg Oral BID   calcitRIOL  0.25 mcg Oral Q T,Th,Sat-1800   carvedilol  3.125 mg Oral BID WC   Chlorhexidine Gluconate Cloth  6 each Topical Daily   ferric citrate  210 mg Oral TID WC   gentamicin cream  1 Application Topical Daily   multivitamin  1 tablet Oral Daily   pantoprazole  80 mg Oral Daily   potassium chloride  20 mEq Oral Daily   sodium chloride flush  10-40 mL Intracatheter Q12H    albumin human Stopped (12/24/23 1610)   cefTAZidime (FORTAZ)  IV 1 g (12/24/23 0937)   dialysis solution 1.5% low-MG/low-CA     dialysis solution 2.5% low-MG/low-CA     promethazine (PHENERGAN) injection (IM or IVPB) Stopped  (12/21/23 2106)   sodium chloride     acetaminophen **OR** acetaminophen, heparin sodium (porcine) 1,000 Units in dialysis solution 1.5% low-MG/low-CA 2,000 mL dialysis solution, heparin sodium (porcine) 3,000 Units in dialysis solution 1.5% low-MG/low-CA 6,000 mL dialysis solution, heparin sodium (porcine) 3,000 Units in dialysis solution 2.5% low-MG/low-CA 6,000 mL dialysis solution, HYDROcodone-acetaminophen, HYDROmorphone (DILAUDID) injection, metoCLOPramide (REGLAN) injection, ondansetron **OR** ondansetron (ZOFRAN) IV, promethazine (PHENERGAN) injection (IM or IVPB), sodium chloride flush

## 2023-12-24 NOTE — Progress Notes (Signed)
eLink Physician-Brief Progress Note Patient Name: ULICES MAACK DOB: [birth year] MRN: [id number]   Date of Service  12/24/2023  HPI/Events of Note  Hypotension during peritoneal dialysis with SBP as low as 60s.  eICU Interventions  Resuscitate with albumin Defer to day team to assess GOC if ongoing issue     Intervention Category Major Interventions: Hypotension - evaluation and management  Zianne Schubring Mechele Collin 12/24/2023, 12:16 AM

## 2023-12-24 NOTE — Progress Notes (Signed)
Patient experiencing periods of hypotension with SBP between 80-70's starting around 2300.  Reached out to provider on call who put in orders to give albumin. Patient did respond with systolics reading from 100-90;s but then downtrended again around 0500. Albumin given again. Will continue to monitor BP.

## 2023-12-24 NOTE — Progress Notes (Signed)
Physical Therapy Treatment Patient Details Name: Shaun Murillo MRN: [id number] DOB: 08-14-1932 Today's Date: 12/24/2023   History of Present Illness 89yr old male who presented to [facility] ED 2/7 due to a fall, and abdominal pain with nausea with vomiting. Admitted to ICU for septic shock, peritonitis, found to be in A-fib with RVR. PMHx: ESRD (on peritoneal dialysis), GERD, Afib on eliquis, gout, cirrhosis and right parotid mass s/p resection-2017    PT Comments  Pt tolerates treatment well ambulating for increased distances and multiple trials this session. Pt demonstrates generalized weakness and benefits from verbal cues from PT to improve efficiency of transfer technique. Pt remains at a high risk for falls due to LE weakness and impaired endurance. Pt still feels unable to perform the mobility necessary to return home. Patient will benefit from continued inpatient follow up therapy, <3 hours/day.    If plan is discharge home, recommend the following: A little help with walking and/or transfers;A little help with bathing/dressing/bathroom;Assistance with cooking/housework;Help with stairs or ramp for entrance;Assist for transportation   Can travel by private vehicle        Equipment Recommendations  BSC/3in1    Recommendations for Other Services       Precautions / Restrictions Precautions Precautions: Fall Recall of Precautions/Restrictions: Impaired Precaution/Restrictions Comments: monitor BP/orthostatics; hx of vertigo Restrictions Weight Bearing Restrictions Per Provider Order: No     Mobility  Bed Mobility Overal bed mobility: Needs Assistance Bed Mobility: Sit to Supine       Sit to supine: Min assist        Transfers Overall transfer level: Needs assistance Equipment used: Rolling walker (2 wheels) Transfers: Sit to/from Stand Sit to Stand: Min assist (light minA)           General transfer comment: verbal cues for hand placement and increased trunk  flexion    Ambulation/Gait Ambulation/Gait assistance: Contact guard assist Gait Distance (Feet): 15 Feet (15' x 2 trials) Assistive device: Rolling walker (2 wheels) Gait Pattern/deviations: Step-to pattern Gait velocity: reduced Gait velocity interpretation: <1.31 ft/sec, indicative of household ambulator   General Gait Details: slowed step-to gait, reduced foot clearance, increased trunk flexion   Stairs             Wheelchair Mobility     Tilt Bed    Modified Rankin (Stroke Patients Only)       Balance Overall balance assessment: Needs assistance Sitting-balance support: No upper extremity supported, Feet supported Sitting balance-Leahy Scale: Fair     Standing balance support: Bilateral upper extremity supported, Reliant on assistive device for balance Standing balance-Leahy Scale: Poor                              Communication Communication Communication: Impaired Factors Affecting Communication: Hearing impaired  Cognition Arousal: Alert Behavior During Therapy: WFL for tasks assessed/performed   PT - Cognitive impairments: Memory                       PT - Cognition Comments: poor short term memory, seems to have reduce recall of discussions with nephrologist from earlier in the day Following commands: Intact      Cueing Cueing Techniques: Verbal cues  Exercises      General Comments General comments (skin integrity, edema, etc.): VSS on RA      Pertinent Vitals/Pain Pain Assessment Pain Assessment: Faces Faces Pain Scale: Hurts even more Pain Location: hernia Pain  Descriptors / Indicators: Grimacing Pain Intervention(s): Monitored during session    Home Living                          Prior Function            PT Goals (current goals can now be found in the care plan section) Acute Rehab PT Goals Patient Stated Goal: Feel well enough to go home Progress towards PT goals: Progressing toward  goals    Frequency    Min 1X/week      PT Plan      Co-evaluation              AM-PAC PT "6 Clicks" Mobility   Outcome Measure  Help needed turning from your back to your side while in a flat bed without using bedrails?: None Help needed moving from lying on your back to sitting on the side of a flat bed without using bedrails?: A Little Help needed moving to and from a bed to a chair (including a wheelchair)?: A Little Help needed standing up from a chair using your arms (e.g., wheelchair or bedside chair)?: A Little Help needed to walk in hospital room?: Total Help needed climbing 3-5 steps with a railing? : Total 6 Click Score: 15    End of Session Equipment Utilized During Treatment: Gait belt Activity Tolerance: Patient limited by fatigue Patient left: in bed;with call bell/phone within reach;with nursing/sitter in room;with family/visitor present Nurse Communication: Mobility status PT Visit Diagnosis: Unsteadiness on feet (R26.81);Other abnormalities of gait and mobility (R26.89);Muscle weakness (generalized) (M62.81);History of falling (Z91.81)     Time: 4098-1191 PT Time Calculation (min) (ACUTE ONLY): 24 min  Charges:    $Gait Training: 8-22 mins $Therapeutic Activity: 8-22 mins PT General Charges $$ ACUTE PT VISIT: 1 Visit                     Arlyss Gandy, PT, DPT Acute Rehabilitation Office [phone]    Arlyss Gandy 12/24/2023, 1:33 PM

## 2023-12-24 NOTE — Progress Notes (Signed)
Pts CCPD tx initiated at 1900--pts pd access dressing changed per sterile protocol---no s/s of any drainage or redness present at exit site  VS are WNL

## 2023-12-24 NOTE — Progress Notes (Signed)
Patient stated he does not like the Ensures and does not wish to drink them.  Patient's son endorsed that patient has not been drinking the Ensures and that son has thrown previously given Ensures away.  Discontinuing nutritional supplement orders.

## 2023-12-24 NOTE — Progress Notes (Signed)
Nutrition Follow-up  DOCUMENTATION CODES:   Severe malnutrition in context of chronic illness  INTERVENTION:   Continue liberalized diet  Continue Ensure Enlive po BID, each supplement provides 350 kcal and 20 grams of protein.  Continue Renal MVI   NUTRITION DIAGNOSIS:   Severe Malnutrition related to chronic illness as evidenced by severe muscle depletion, moderate muscle depletion, severe fat depletion, moderate fat depletion.  Being addressed via TF  GOAL:   Patient will meet greater than or equal to 90% of their needs  Progressing  MONITOR:   PO intake, Weight trends, Supplement acceptance, Labs  REASON FOR ASSESSMENT:   Consult Assessment of nutrition requirement/status, Poor PO  ASSESSMENT:   89 y.o. M, Presented to ED from home with reports of fall and abdominal pain with vomiting.  Patient lives at home with 24/7 care shared between his sons. Admitted with primary problem sepsis secondary to Peritonitis associated with peritoneal dialysis.  PMH: ESRD, HTN, Hearing loss, Arthritis.  Reviewed notes on GOC, noted pt has decided that if he is unable to tolerate ogoing PD, he has decided he will transfer to comfort.  Pt is very hard of hearing, despite RDs best efforts, pt with difficulty understanding some of the questions being asked, therefore nutrition hx is limited.   Pt reports his appetite has been very poor for the last few days but feels like hs is eating better today. Noted lunch tray at bedside, looks like <25% of meal consumed.   Pt continuing on PD, noted TDC placed incase alternative RRT needed CCPD: Current Prescription 50/50 1.5% and 2.5% dextrose. Pt receiving some calories via dextrose  Labs: sodium 128 (L), potassium 3.8 (wdl) Meds: ferric citrate, reglan, renal MVI, protonix, calcitriol, KCl  NUTRITION - FOCUSED PHYSICAL EXAM:  Flowsheet Row Most Recent Value  Orbital Region Severe depletion  Upper Arm Region Moderate depletion   Thoracic and Lumbar Region Severe depletion  Buccal Region Severe depletion  Temple Region Severe depletion  Clavicle Bone Region Moderate depletion  Clavicle and Acromion Bone Region Moderate depletion  Scapular Bone Region Severe depletion  Dorsal Hand Mild depletion  Patellar Region Moderate depletion  Anterior Thigh Region Moderate depletion  Posterior Calf Region Mild depletion  Edema (RD Assessment) Mild  Hair Reviewed  Eyes Reviewed  Mouth Other (Comment)  [+dentures, dry tongue]  Skin Other (Comment)  [Ecchymosis, petichiae]  Nails Reviewed       Diet Order:   Diet Order             Diet regular Room service appropriate? Yes with Assist; Fluid consistency: Thin; Fluid restriction: Other (see comments)  Diet effective now                   EDUCATION NEEDS:   Not appropriate for education at this time  Skin:  Skin Assessment: Reviewed RN Assessment  Last BM:  2/10  Height:   Ht Readings from Last 1 Encounters:  12/24/23 5\' 8"  (1.727 m)    Weight:   Wt Readings from Last 1 Encounters:  12/24/23 63.5 kg    BMI:  Body mass index is 21.29 kg/m.  Estimated Nutritional Needs:   Kcal:  0865-7846 kcal  Protein:  80-100 g  Fluid:  1.5 L   Romelle Starcher MS, RDN, LDN, CNSC Registered Dietitian 3 Clinical Nutrition RD Inpatient Contact Info in Amion

## 2023-12-25 DIAGNOSIS — T8571XA Infection and inflammatory reaction due to peritoneal dialysis catheter, initial encounter: Secondary | ICD-10-CM | POA: Diagnosis not present

## 2023-12-25 DIAGNOSIS — E43 Unspecified severe protein-calorie malnutrition: Secondary | ICD-10-CM | POA: Insufficient documentation

## 2023-12-25 LAB — COMPREHENSIVE METABOLIC PANEL
ALT: 21 U/L (ref 0–44)
AST: 21 U/L (ref 15–41)
Albumin: 2.2 g/dL — ABNORMAL LOW (ref 3.5–5.0)
Alkaline Phosphatase: 54 U/L (ref 38–126)
Anion gap: 14 (ref 5–15)
BUN: 38 mg/dL — ABNORMAL HIGH (ref 8–23)
CO2: 23 mmol/L (ref 22–32)
Calcium: 8.4 mg/dL — ABNORMAL LOW (ref 8.9–10.3)
Chloride: 89 mmol/L — ABNORMAL LOW (ref 98–111)
Creatinine, Ser: 6.35 mg/dL — ABNORMAL HIGH (ref 0.61–1.24)
GFR, Estimated: 8 mL/min — ABNORMAL LOW (ref >60)
Glucose, Bld: 132 mg/dL — ABNORMAL HIGH (ref 70–99)
Potassium: 3.8 mmol/L (ref 3.5–5.1)
Sodium: 126 mmol/L — ABNORMAL LOW (ref 135–145)
Total Bilirubin: 0.7 mg/dL (ref 0.0–1.2)
Total Protein: 4.7 g/dL — ABNORMAL LOW (ref 6.5–8.1)

## 2023-12-25 LAB — CBC
HCT: 28.2 % — ABNORMAL LOW (ref 39.0–52.0)
Hemoglobin: 9.7 g/dL — ABNORMAL LOW (ref 13.0–17.0)
MCH: 32.7 pg (ref 26.0–34.0)
MCHC: 34.4 g/dL (ref 30.0–36.0)
MCV: 94.9 fL (ref 80.0–100.0)
Platelets: 186 10*3/uL (ref 150–400)
RBC: 2.97 MIL/uL — ABNORMAL LOW (ref 4.22–5.81)
RDW: 16 % — ABNORMAL HIGH (ref 11.5–15.5)
WBC: 8 10*3/uL (ref 4.0–10.5)
nRBC: 0 % (ref 0.0–0.2)

## 2023-12-25 LAB — CULTURE, BLOOD (ROUTINE X 2)
Culture: NO GROWTH
Culture: NO GROWTH

## 2023-12-25 LAB — PROCALCITONIN: Procalcitonin: 12.2 ng/mL

## 2023-12-25 LAB — MAGNESIUM: Magnesium: 2 mg/dL (ref 1.7–2.4)

## 2023-12-25 MED ORDER — DELFLEX-LC/2.5% DEXTROSE 394 MOSM/L IP SOLN: INTRAPERITONEAL | Status: DC

## 2023-12-25 MED ORDER — DELFLEX-LC/1.5% DEXTROSE 344 MOSM/L IP SOLN: INTRAPERITONEAL | Status: DC

## 2023-12-25 MED ORDER — POLYVINYL ALCOHOL 1.4 % OP SOLN
1.0000 [drp] | OPHTHALMIC | Status: DC | PRN
  Administered 2023-12-25: 1 [drp] via OPHTHALMIC
  Filled 2023-12-25: qty 15

## 2023-12-25 NOTE — Progress Notes (Signed)
[facility] Progress Note  Subjective:   Some initial discomfort with no fill volume of 1.5 L which eased off overnight.  May be some anxiety contributing Serum sodium 126, K3.8, albumin 2.2. Discussed with family, patient; plan is still that they wish to stick with PD.  They do not think that they have reached that they need to stop Working with PT as he needs to get the dialysis clinic at least twice a month Blood pressures are stable, no fevers  Vitals:   12/25/23 0500 12/25/23 0700 12/25/23 0759 12/25/23 0900  BP: 117/75 110/79 105/83 121/66  Pulse: 85 95 93 94  Resp: 15 19 13 15   Temp:   (!) 97.5 F (36.4 C)   TempSrc:   Oral   SpO2: 100% 100% 99% 100%  Weight:      Height:        Started PD in July 2021 after PD cath placed by Dr Buzzy Han.  01/2023 - RSV infection w/ fall, [facility] admit 02/2023 - weak and tired 06/2023 - recent dx afib, switched norvasc to coreg for rate control 08/2023 - still weak, denied pt referral 09/2023 - balance issues 10/2023 - borderline clearance w/ PD, feels very poorly on 6 exchanges, short dwells of 1 hr 11/2023 - chronic back pain and progressive weakness, declining, will cont PD although not meeting kt/v for clearance, discussed iHD pt declined and discussed palliative/ hospice care. Pt said when he fails PD he will do hospice   Exam: Gen alert, no distress, awake, engaging Sclera anicteric, throat clear  No jvd or bruits Chest clear bilat to bases, no rales/ wheezing RRR no RG Abd mild-mod abd tenderness, improving, no mass or ascites +bs Ext no LE edema Neuro is alert, Ox 3 , nf     PD cath mid R abdomen     Renal-related home meds: - norvasc 5 hs - auryxia 210 ac tid - rocaltrol 0.25 mcg po tts - lasix 80 qam - others: PPI, eliquis, zyloprim, prns    OP PD: nightly CCPD Dr Ronalee Belts  60.5kg   1h dwells x 6 overnight, fill vol 2.3 L - last HB  10.4 on 2/3, last mircera sq 75 mcg on 1/23       Assessment/  Plan: Klebsiella pneumoniae PD cath-related peritonitis - presented w/ abd pain/ N/V. ESRD on peritoneal dialysis since 2021.  Intitial Peritoneal fluid TNC 12/20/23 was 645w/ +GNR's on gram stain.  Repeat 2/10 with 55 TNC's. Now on ceftazidime, plan to complete a 2-week course Moved towards outpatient PD Rx: 1.5L x6 at the current time; 50/50 mix 1.5% and 2.5% dextrose;  Progressive hyponatremia Need to limit free water intake and deed more U as above; Fluid restrict to 1L/d; improving ESRD - on CCPD.  He would never receive hemodialysis.  Temp HD catheter has been removed.   BP - Off vasopressors.  Off carvedilol, trend Anemia of esrd -trend hemoglobin, 9.4, has been fairly stable. Secondary hyperparathyroidism - CCa in range.  Phosphorus at target.  Cont binder w/ meals and po vdra on tts schedule.  GOC - pt is DNR. Pt's PD clearance is not great, has chronic fatigue related to this and /or other issues. Pt refuses HD and has stated that when the PD fails he will "go to hospice".  Of note patient cannot continue PD if transitions to SNF at discharge.       Arita Miss, MD  12/25/2023, 10:19 AM  Recent Labs  Lab  12/24/23 0415 12/25/23 0429  HGB 9.4* 9.7*  ALBUMIN 1.6* 2.2*  CALCIUM 8.4* 8.4*  PHOS 4.3  --   CREATININE 6.47* 6.35*  K 3.8 3.8   No results for input(s): "IRON", "TIBC", "FERRITIN" in the last 168 hours. Inpatient medications:  allopurinol  100 mg Oral Q M,W,F   apixaban  2.5 mg Oral BID   calcitRIOL  0.25 mcg Oral Q T,Th,Sat-1800   Chlorhexidine Gluconate Cloth  6 each Topical Daily   ferric citrate  210 mg Oral TID WC   gentamicin cream  1 Application Topical Daily   multivitamin  1 tablet Oral Daily   pantoprazole  80 mg Oral Daily   potassium chloride  20 mEq Oral Daily   sodium chloride flush  10-40 mL Intracatheter Q12H    cefTAZidime (FORTAZ)  IV 1 g (12/25/23 1016)   dialysis solution 1.5% low-MG/low-CA     dialysis solution 2.5% low-MG/low-CA      promethazine (PHENERGAN) injection (IM or IVPB) Stopped (12/21/23 2106)   sodium chloride     acetaminophen **OR** acetaminophen, heparin sodium (porcine) 1,000 Units in dialysis solution 1.5% low-MG/low-CA 2,000 mL dialysis solution, heparin sodium (porcine) 3,000 Units in dialysis solution 1.5% low-MG/low-CA 6,000 mL dialysis solution, heparin sodium (porcine) 3,000 Units in dialysis solution 2.5% low-MG/low-CA 6,000 mL dialysis solution, HYDROcodone-acetaminophen, HYDROmorphone (DILAUDID) injection, metoCLOPramide (REGLAN) injection, ondansetron **OR** ondansetron (ZOFRAN) IV, promethazine (PHENERGAN) injection (IM or IVPB), sodium chloride flush

## 2023-12-25 NOTE — Progress Notes (Signed)
Occupational Therapy Treatment Patient Details Name: Shaun Murillo MRN: [id number] DOB: 1932-10-02 Today's Date: 12/25/2023   History of present illness 89yr old male who presented to [facility] ED 2/7 due to a fall, and abdominal pain with nausea with vomiting. Admitted to ICU for septic shock, peritonitis, found to be in A-fib with RVR. 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 Pt presenting with impairments listed below, will follow acutely. Patient will benefit from continued inpatient follow up therapy, <3 hours/day to maximize safety/ind with ADL/functional mobility.       If plan is discharge home, recommend the following:  A lot of help with walking and/or transfers;A lot of help with bathing/dressing/bathroom;Assistance with cooking/housework;Direct supervision/assist for medications management;Direct supervision/assist for financial management;Assist for transportation;Help with stairs or ramp for entrance   Equipment Recommendations  BSC/3in1    Recommendations for Other Services PT consult    Precautions / Restrictions Precautions Precautions: Fall Recall of Precautions/Restrictions: Impaired Precaution/Restrictions Comments: monitor BP/orthostatics; hx of vertigo Restrictions Weight Bearing Restrictions Per Provider Order: No       Mobility Bed Mobility Overal bed mobility: Needs Assistance Bed Mobility: Sit to Supine       Sit to supine: Min assist        Transfers Overall transfer level: Needs assistance Equipment used: Rolling walker (2 wheels) Transfers: Sit to/from  Stand Sit to Stand: Min assist, Mod assist                 Balance Overall balance assessment: Needs assistance Sitting-balance support: No upper extremity supported, Feet supported Sitting balance-Leahy Scale: Fair     Standing balance support: Bilateral upper extremity supported, Reliant on assistive device for balance Standing balance-Leahy Scale: Poor                             ADL either performed or assessed with clinical judgement   ADL Overall ADL's : Needs assistance/impaired                     Lower Body Dressing: Minimal assistance;Bed level Lower Body Dressing Details (indicate cue type and reason): dons socks figure 4 at bed level Toilet Transfer: Minimal assistance;Moderate assistance;Ambulation;Regular Toilet;Rolling walker (2 wheels) Toilet Transfer Details (indicate cue type and reason): mod A for iniital stand         Functional mobility during ADLs: Moderate assistance;Rolling walker (2 wheels)      Extremity/Trunk Assessment Upper Extremity Assessment Upper Extremity Assessment: Generalized weakness   Lower Extremity Assessment Lower Extremity Assessment: Defer to PT evaluation        Vision   Vision Assessment?: No apparent visual deficits   Perception Perception Perception: Not tested   Praxis Praxis Praxis: Not tested   Communication Communication Factors Affecting Communication: Hearing impaired   Cognition Arousal: Alert Behavior During Therapy: WFL for tasks assessed/performed Cognition: No apparent impairments  Following commands: Impaired Following commands impaired: Follows one step commands with increased time      Cueing   Cueing Techniques: Verbal cues  Exercises      Shoulder Instructions       General Comments VSS on RA    Pertinent Vitals/ Pain       Pain Assessment Pain Assessment: No/denies pain  Home Living                                           Prior Functioning/Environment              Frequency  Min 1X/week        Progress Toward Goals  OT Goals(current goals can now be found in the care plan section)  Progress towards OT goals: Progressing toward goals  Acute Rehab OT Goals Patient Stated Goal: none stated OT Goal Formulation: With patient Time For Goal Achievement: 01/06/24 Potential to Achieve Goals: Good ADL Goals Pt Will Perform Upper Body Dressing: with supervision;sitting Pt Will Perform Lower Body Dressing: with supervision;sit to/from stand;sitting/lateral leans Pt Will Transfer to Toilet: with supervision;ambulating;regular height toilet Pt Will Perform Tub/Shower Transfer: Shower transfer;with supervision;ambulating;shower seat;rolling walker  Plan      Co-evaluation                 AM-PAC OT "6 Clicks" Daily Activity     Outcome Measure   Help from another person eating meals?: A Little Help from another person taking care of personal grooming?: A Little Help from another person toileting, which includes using toliet, bedpan, or urinal?: A Lot Help from another person bathing (including washing, rinsing, drying)?: A Lot Help from another person to put on and taking off regular upper body clothing?: A Little Help from another person to put on and taking off regular lower body clothing?: A Lot 6 Click Score: 15    End of Session Equipment Utilized During Treatment: Rolling walker (2 wheels)  OT Visit Diagnosis: Unsteadiness on feet (R26.81);Other abnormalities of gait and mobility (R26.89);Muscle weakness (generalized) (M62.81)   Activity Tolerance Patient tolerated treatment well   Patient Left in chair;with call bell/phone within reach;with chair alarm set;with nursing/sitter in room   Nurse Communication Mobility status        Time: 1610-9604 OT Time Calculation (min): 15 min  Charges: OT General Charges $OT Visit: 1 Visit OT  Treatments $Self Care/Home Management : 8-22 mins  Carver Fila, OTD, OTR/L SecureChat Preferred Acute Rehab [phone]   Carver Fila Koonce 12/25/2023, 9:38 AM

## 2023-12-25 NOTE — Plan of Care (Signed)

## 2023-12-25 NOTE — Progress Notes (Signed)
Received call from overnight HD RN. Patient refused PD tonight especially after HD RN had a lengthy conversation with the patient, apparently does not want to continue treatment moving forward. Sent message to RN to contact covering overnight hospitalist as it seems that patient wants to transition to hospice per review of his chart.

## 2023-12-25 NOTE — Progress Notes (Signed)
Tx completed, PD catheter deaccessed by aseptic technique. Large fibrin clots found in effluent bag, per report by caregiver/son, pt uses heparin with home treatments    12/25/23 0700  Peritoneal Catheter Right lower abdomen  Placement Date: (c) 01/18/23   Catheter Location: Right lower abdomen  Site Assessment Clean, Dry, Intact  Drainage Description None  Catheter status Deaccessed  Dressing Gauze/Drain sponge  Dressing Status Clean, Dry, Intact  Dressing Intervention Dressing reinforced  Completion  Treatment Status Complete  Initial Drain Volume 315  Average Dwell Time-Hour(s) 1  Average Dwell Time-Min(s) 15  Average Drain Time 29  Total Therapy Volume 9000  Total Therapy Time-Hour(s) 11  Total Therapy Time-Min(s) 31  Effluent Appearance Fibrin  Procedure Comments  Tolerated treatment well? No (comment)  Peritoneal Dialysis Comments Son at [facility], reports pt had pain with 1.5L fill volume, states pt was more comfortable with 1.2L fill  Education / Care Plan  Dialysis Education Provided Yes

## 2023-12-25 NOTE — Progress Notes (Signed)
Progress Note   Patient: Shaun Murillo OZH:[id number] DOB: 1931-12-23 DOA: 12/20/2023     5 DOS: the patient was seen and examined on 12/25/2023   Brief hospital course: 89yo with h/o ESRD on PD, afib on Eliquis, cirrhosis, and R parotid mass s/p remote resection who presented on 2/7 with a fall and abdominal pain. Evaluation showed sepsis due to peritonitis as well as hypotension and afib with RVR.  He was transferred to the ICU due to persistent hypotension despite fluid resuscitation and required pressors.  Cardiology and nephrology have also consulted.  He had recurrent hypotension overnight during PD on 2/11 and was given albumin.  Assessment and Plan:  Septic shock with lactic acidosis secondary to SBP with chronic peritoneal catheter from Klebsiella Pneumoniae Patient presented with infection + hypotension Required pressors, now off Continue ceftazidime per nephrology Holding Coreg, Lasix   ESRD Undergoes Peritoneal Dialysis Continues on peritoneal dialysis per nephrology He does not want to go on regular hemodialysis Nephrology is consulting   Removed temporary HD catheter He reports persistent early satiety that does not appear to have improved while infection is improving   Hypotension Due to sepsis on presentation Baseline pressure has improved, no longer needing pressors However, he continued to drop BPs overnight on 2/10-11 He improved some with albumin  Consider addition of midodrine If he is unable to tolerate PD due to recurrent hypotension, ongoing GOC conversation is likely needed  Dysphagia Patient reports early satiety and a globus sensation SLP consulted This is a long-standing problem and patient/family do not wish to address it at this time Dietary recommendations as per SLP He is unlikely to be able to get stronger so long as he continues to be unable to tolerate PO, so if he does not wish to consider esophageal dilation then comfort care is likely most  reasonable Patient/family are considering their options and palliative care is consulted   Hyponatremia  Stable, no current treatment needed   Atrial fibrillation RVR, known history of A-fib on chronic Eliquis Continue Eliquis Discontinued amiodarone   Started carvedilol but this is stopped due to hypotension overnight   H/o cirrhosis  Appears compensated Notmal LFTs   Severe malnutrition  Nutrition Problem: Severe Malnutrition Etiology: chronic illness Signs/Symptoms: severe muscle depletion, moderate muscle depletion, severe fat depletion, moderate fat depletion Interventions: Ensure Enlive (each supplement provides 350kcal and 20 grams of protein), Liberalize Diet   Anemia of chronic disease Continue to follow with iron replacement Trigger for transfusion is less than 7 or active bleeding with hemodynamic compromise   DNR/GOC Confirmed on admission Palliative care consulted - he had an appointment to meet with [facility] palliative at home this coming Thursday If he is unable to tolerate ongoing PD, he has decided that he will transition to comfort He appears to be leaning in that direction at this time This is further complicated by his inability to tolerate PO with nausea anytime he gets out of bed as well his inability to do PD in a SNF - meaning that his rehab potential is bleak Will await further family discussions with palliative care           Consultants: PCCM Nephrology Cardiology Palliative care PT OT SLP Nutrition TOC team   Procedures: Peritoneal dialysis CVC insertion 2/7 ABI 2/8   Antibiotics: Cefepime x 1 Vancomycin x 1 Zosyn 2/8-9 Ceftazidime 2/9-   30 Day Unplanned Readmission Risk Score    Flowsheet Row ED to Hosp-Admission (Current) from 12/20/2023  in [facility]  30 Day Unplanned Readmission Risk Score (%) 21.57 Filed at 12/25/2023 0400       This score is the patient's risk of an unplanned  readmission within 30 days of being discharged (0 -100%). The score is based on dignosis, age, lab data, medications, orders, and past utilization.   Low:  0-14.9   Medium: 15-21.9   High: 22-29.9   Extreme: 30 and above           Subjective: He reports that he is ready to go be with his brothers and sisters and wife, who have passed before him.  However, upon further discussion he is still not completely sure this is what he wants.   Objective: Vitals:   12/25/23 1149 12/25/23 1200  BP:  110/85  Pulse:  97  Resp:  19  Temp: (!) 96.6 F (35.9 C)   SpO2:  95%    Intake/Output Summary (Last 24 hours) at 12/25/2023 1337 Last data filed at 12/25/2023 1200 Gross per 24 hour  Intake 408.83 ml  Output --  Net 408.83 ml   Filed Weights   12/22/23 0500 12/23/23 1040 12/24/23 0930  Weight: 65.9 kg (S) 68.6 kg 63.5 kg    Exam:  General:  Appears calm but uncomfortable due to nausea Eyes:   EOMI, normal lids, iris ENT:  hard of hearing with hearing aids in place, grossly normal lips & tongue, mmm Neck:  no LAD, masses or thyromegaly Cardiovascular:  RRR, no m/r/g. No LE edema.  Respiratory:   CTA bilaterally with no wheezes/rales/rhonchi.  Normal respiratory effort. Abdomen:  soft, NT, ND; + L inguinal hernia Skin:  no rash or induration seen on limited exam Musculoskeletal:  grossly normal tone BUE/BLE, good ROM, no bony abnormality Psychiatric:  blunted mood and affect, speech fluent and appropriate, AOx3 Neurologic:  CN 2-12 grossly intact, moves all extremities in coordinated fashion  Data Reviewed: I have reviewed the patient's lab results since admission.  Pertinent labs for today include:   Na++ 126 Glucose 132 BUN 38/Creatinine 6.35/GFR 8 Albumin 2.2 Procalcitonin 12.2, improving WBC 8 Hgb 9.7, stable Blood cultures NTD x 4 days    Family Communication: Sons were present throughout  Disposition: Status is: Inpatient Remains inpatient appropriate because:  ongoing management     Time spent: 50 minutes  Unresulted Labs (From admission, onward)     Start     Ordered   12/20/23 0735  Urinalysis, w/ Reflex to Culture (Infection Suspected) -Urine, Clean Catch  (Undifferentiated presentation (screening labs and basic nursing orders))  ONCE - URGENT,   URGENT       Question:  Specimen Source  Answer:  Urine, Clean Catch   12/20/23 0735   Unscheduled  CBC with Differential/Platelet  Tomorrow morning,   R       Question:  Specimen collection method  Answer:  Lab=Lab collect   12/25/23 1337   Unscheduled  Basic metabolic panel  Tomorrow morning,   R       Question:  Specimen collection method  Answer:  Lab=Lab collect   12/25/23 1337             Author: Jonah Blue, MD 12/25/2023 1:37 PM  For on call review [email or web address].

## 2023-12-26 DIAGNOSIS — Z515 Encounter for palliative care: Secondary | ICD-10-CM

## 2023-12-26 DIAGNOSIS — T8571XA Infection and inflammatory reaction due to peritoneal dialysis catheter, initial encounter: Secondary | ICD-10-CM | POA: Diagnosis not present

## 2023-12-26 DIAGNOSIS — Z7189 Other specified counseling: Secondary | ICD-10-CM | POA: Diagnosis not present

## 2023-12-26 DIAGNOSIS — T8571XD Infection and inflammatory reaction due to peritoneal dialysis catheter, subsequent encounter: Secondary | ICD-10-CM | POA: Diagnosis not present

## 2023-12-26 DIAGNOSIS — Z66 Do not resuscitate: Secondary | ICD-10-CM

## 2023-12-26 DIAGNOSIS — A419 Sepsis, unspecified organism: Secondary | ICD-10-CM | POA: Diagnosis not present

## 2023-12-26 LAB — BASIC METABOLIC PANEL
Anion gap: 13 (ref 5–15)
BUN: 43 mg/dL — ABNORMAL HIGH (ref 8–23)
CO2: 22 mmol/L (ref 22–32)
Calcium: 8.4 mg/dL — ABNORMAL LOW (ref 8.9–10.3)
Chloride: 90 mmol/L — ABNORMAL LOW (ref 98–111)
Creatinine, Ser: 7.54 mg/dL — ABNORMAL HIGH (ref 0.61–1.24)
GFR, Estimated: 6 mL/min — ABNORMAL LOW (ref >60)
Glucose, Bld: 75 mg/dL (ref 70–99)
Potassium: 4.7 mmol/L (ref 3.5–5.1)
Sodium: 125 mmol/L — ABNORMAL LOW (ref 135–145)

## 2023-12-26 LAB — CBC WITH DIFFERENTIAL/PLATELET
Abs Immature Granulocytes: 0.16 10*3/uL — ABNORMAL HIGH (ref 0.00–0.07)
Basophils Absolute: 0 10*3/uL (ref 0.0–0.1)
Basophils Relative: 0 %
Eosinophils Absolute: 0 10*3/uL (ref 0.0–0.5)
Eosinophils Relative: 0 %
HCT: 29.8 % — ABNORMAL LOW (ref 39.0–52.0)
Hemoglobin: 10.3 g/dL — ABNORMAL LOW (ref 13.0–17.0)
Immature Granulocytes: 2 %
Lymphocytes Relative: 6 %
Lymphs Abs: 0.7 10*3/uL (ref 0.7–4.0)
MCH: 32.3 pg (ref 26.0–34.0)
MCHC: 34.6 g/dL (ref 30.0–36.0)
MCV: 93.4 fL (ref 80.0–100.0)
Monocytes Absolute: 0.6 10*3/uL (ref 0.1–1.0)
Monocytes Relative: 6 %
Neutro Abs: 9.2 10*3/uL — ABNORMAL HIGH (ref 1.7–7.7)
Neutrophils Relative %: 86 %
Platelets: 202 10*3/uL (ref 150–400)
RBC: 3.19 MIL/uL — ABNORMAL LOW (ref 4.22–5.81)
RDW: 15.9 % — ABNORMAL HIGH (ref 11.5–15.5)
WBC: 10.7 10*3/uL — ABNORMAL HIGH (ref 4.0–10.5)
nRBC: 0 % (ref 0.0–0.2)

## 2023-12-26 MED ORDER — BIOTENE DRY MOUTH MT LIQD: 15.0000 mL | OROMUCOSAL | Status: DC | PRN

## 2023-12-26 MED ORDER — GLYCOPYRROLATE 1 MG PO TABS: 1.0000 mg | ORAL_TABLET | ORAL | Status: DC | PRN

## 2023-12-26 MED ORDER — HALOPERIDOL 1 MG PO TABS: 0.5000 mg | ORAL_TABLET | ORAL | Status: DC | PRN

## 2023-12-26 MED ORDER — LORAZEPAM 1 MG PO TABS: 1.0000 mg | ORAL_TABLET | ORAL | Status: DC | PRN

## 2023-12-26 MED ORDER — HALOPERIDOL LACTATE 2 MG/ML PO CONC: 0.5000 mg | ORAL | Status: DC | PRN

## 2023-12-26 MED ORDER — DIPHENHYDRAMINE HCL 50 MG/ML IJ SOLN: 12.5000 mg | INTRAMUSCULAR | Status: DC | PRN

## 2023-12-26 MED ORDER — LORAZEPAM 2 MG/ML IJ SOLN: 1.0000 mg | INTRAMUSCULAR | Status: DC | PRN

## 2023-12-26 MED ORDER — HALOPERIDOL LACTATE 5 MG/ML IJ SOLN: 0.5000 mg | INTRAMUSCULAR | Status: DC | PRN

## 2023-12-26 MED ORDER — GLYCOPYRROLATE 0.2 MG/ML IJ SOLN: 0.2000 mg | INTRAMUSCULAR | Status: DC | PRN

## 2023-12-26 MED ORDER — LORAZEPAM 2 MG/ML PO CONC: 1.0000 mg | ORAL | Status: DC | PRN

## 2023-12-26 NOTE — Progress Notes (Signed)
Pt refused peritoneal dialysis at approximately 19:30, with family at bedside. Pt states he is done with dialysis, expresses a wish to pass peacefully, states he is tired and his body is worn out. Family is respectful of his wishes and would like to return tomorrow for a meeting with palliative care to discuss options for hospice care. Pt was amenable to taking his scheduled Eliquis and requested tylenol, is resting comfortably at this time.    Elliot Cousin, RN

## 2023-12-26 NOTE — Progress Notes (Signed)
Pt transferred to 5c room 12 via wheelchair no distress noted accompanied by NT and family report given to nurse on duty.

## 2023-12-26 NOTE — TOC Progression Note (Signed)
Transition of Care [facility]) - Progression Note    Patient Details  Name: Shaun Murillo MRN: [id number] Date of Birth: [birth year]  Transition of Care [facility]) CM/SW Contact  Erin Sons, [place] Phone Number: 12/26/2023, 11:08 AM  Clinical Narrative:     CSW is notified by attending via secure chat that family would like pt is transitioning to comfort care. Family interested in residential hospice at [facility] or [facility]. [facility] liaisons are included on secure chat. [facility] will review referral and follow up with family.                                        Social Determinants of Health (SDOH) Interventions SDOH Screenings   Food Insecurity: No Food Insecurity (12/21/2023)  Housing: Low Risk  (12/21/2023)  Transportation Needs: No Transportation Needs (12/21/2023)  Utilities: Not At Risk (12/21/2023)  Social Connections: Moderately Integrated (12/21/2023)  Tobacco Use: Medium Risk (03/04/2023)   Received from [facility], [facility]    Readmission Risk Interventions    12/23/2023    3:32 PM  Readmission Risk Prevention Plan  Transportation Screening Complete  PCP or Specialist Appt within 5-7 Days Complete  Home Care Screening Complete  Medication Review (RN CM) Referral to Pharmacy

## 2023-12-26 NOTE — Plan of Care (Signed)
  Problem: Education: Goal: Knowledge of General Education information will improve Description: Including pain rating scale, medication(s)/side effects and non-pharmacologic comfort measures Outcome: Progressing   Problem: Health Behavior/Discharge Planning: Goal: Ability to manage health-related needs will improve Outcome: Progressing   Problem: Clinical Measurements: Goal: Ability to maintain clinical measurements within normal limits will improve Outcome: Progressing Goal: Respiratory complications will improve Outcome: Progressing Goal: Cardiovascular complication will be avoided Outcome: Progressing   Problem: Coping: Goal: Level of anxiety will decrease Outcome: Progressing   Problem: Elimination: Goal: Will not experience complications related to bowel motility Outcome: Progressing   Problem: Pain Managment: Goal: General experience of comfort will improve and/or be controlled Outcome: Progressing   Problem: Safety: Goal: Ability to remain free from injury will improve Outcome: Progressing   Problem: Skin Integrity: Goal: Risk for impaired skin integrity will decrease Outcome: Progressing

## 2023-12-26 NOTE — Care Management Important Message (Signed)
Important Message  Patient Details  Name: Shaun Murillo MRN: [id number] Date of Birth: 1932/11/08   Important Message Given:  Yes - Medicare IM     [name] 12/26/2023, 3:28 PM

## 2023-12-26 NOTE — Consult Note (Signed)
Palliative Care Consult Note                                  Date: 12/26/2023   Patient Name: Shaun Murillo  DOB: 1932-03-10  MRN: [id number]  Age / Sex: 89 y.o., male  PCP: Irven Coe, MD Referring Physician: Jonah Blue, MD  Reason for Consultation: Establishing goals of care  HPI/Patient Profile: 89 y.o. male  with past medical history of ESRD on PD, afib on Eliquis, cirrhosis, and R parotid mass s/p remote resection who presented on 2/7 with a fall and abdominal pain. Evaluation showed sepsis due to peritonitis as well as hypotension and afib with RVR. He was transferred to the ICU due to persistent hypotension despite fluid resuscitation and required pressors. He was admitted on 12/20/2023 with septic shock with lactic acidosis SBP concomitant (yellowing of the dysphagia, and others.   Palliative medicine was consulted for GOC conversations.  Past Medical History:  Diagnosis Date   Arthritis    Complication of anesthesia    ESRD on peritoneal dialysis [facility])    Dr Ronalee Belts w/ [facility], started PD in 2021   Hard of hearing    Hypertension    Kidney stones    PONV (postoperative nausea and vomiting)     Subjective:   This NP Wynne Dust reviewed medical records, received report from team, assessed the patient and then meet at the patient's bedside to discuss diagnosis, prognosis, GOC, EOL wishes disposition and options.  Prior to entering the room I spoke with hospitalist Dr. Jonah Blue.  She shares patient's desire for comfort care and hospice.  I met with the patient at the bedside.  Also present was the patient's daughter and 2 sons.   We meet to discuss diagnosis prognosis, GOC, EOL wishes, disposition and options. Concept of Palliative Care was introduced as specialized medical care for people and their families living with serious illness.  If focuses on providing relief from the symptoms and stress of a serious illness.  The  goal is to improve quality of life for both the patient and the family. Values and goals of care important to patient and family were attempted to be elicited.  Created space and opportunity for patient  and family to explore thoughts and feelings regarding current medical situation   Natural trajectory and current clinical status were discussed. Questions and concerns addressed. Patient  encouraged to call with questions or concerns.    Patient/Family Understanding of Illness: They understand he is very sick, he has been on peritoneal dialysis for 3 years.  At this point this is the first time he has had an infection.  He tells me "I feel very weak and worn out.  I do not want to do this anymore."  We spent some time discussing details of his clinical situation including chronic illnesses and acute illness.  Life Review: The patient states she has had a good life.  He has been cared for by his children 24/7 for the past couple years.  He feels very fortunate to have them.  He became understandably tearful when talking about this.  Patient Values: Comfort at the end of life, family  Goals: After our discussion we decided goals of care to be transition to comfort care and hospice for end-of-life.  Today's Discussion: In addition to discussions described above we had extensive discussion of various topics.  We spent some time talking  about end-of-life.  We just discussed his current symptoms and feeling "wore out".  At the end of the discussion we decided that his quality of life is not what he wanted to be and he is ready to die.  He states he does not want to do dialysis any further.    Given this we discussed comfort care. I explained comfort care as care where the patient would no longer receive aggressive medical interventions such as continuous vital signs, lab work, radiology testing, or medications not focused on comfort, peace, and dignity. This includes stopping antibiotics and weaning  oxygen to room air, as these are generally not accepted as providing comfort but only prolonging the dying process artificially. All care would focus on how the patient is looking and feeling. This would include management of any symptoms that may cause discomfort, pain, shortness of breath/air hunger, increased work of breathing, cough, nausea, agitation/restlessness, anxiety, and/or secretions etc. Symptoms would be managed with medications and other non-pharmacological interventions such as spiritual support if requested, repositioning, music therapy, or therapeutic listening. Family verbalized understanding and agreement.  At the end of this discussion they elected to transition to comfort care while in the hospital.  They also expressed her interest in hospice care.  Specifically they would like to be evaluated for inpatient/residential hospice in [place] hospice home. I described hospice as a service for patients who have a life expectancy of 6 months or less. The goal of hospice is the preservation of dignity and quality at the end phases of life. Under hospice care, the focus changes from curative to symptom relief. I explained the three setting where hospice services can be provided including the home, at a living facility (such as LTC SNF, Assisted Living, etc), and a hospice facility. I explained that acceptance to hospice in any specific location is the final decision of the hospice medical director and bed availability, if applicable. They verbalized understanding.  At the end of this conversation they elected evaluation for inpatient hospice with [facility] in [place].  I shared that I would discuss with the hospice liaison and they would likely evaluate him today.  They understand that placement is dependent on hospice medical director approval and bed availability.  However while he was here he will remain on comfort care.  They also understand it becomes too unstable or too  uncomfortable to transfer and he may have in-hospital death and they are okay with this.  However, we will make all attempts to get him to residential hospice per his wishes.  I provided emotional and general support through therapeutic listening, empathy, sharing of stories, therapeutic touch, and other techniques. I answered all questions and addressed all concerns to the best of my ability.  After our visit I communicated with the medical team, TOC, nursing team, and hospice liaison to update on details of her conversation.  The patient is pending transfer to another floor from the ICU.  Review of Systems  Constitutional:  Positive for fatigue.       Denies pain in general  Respiratory:  Negative for cough and shortness of breath.   Cardiovascular:  Negative for chest pain.  Gastrointestinal:  Negative for abdominal pain, nausea and vomiting.  Neurological:  Positive for weakness.    Objective:   Primary Diagnoses: Present on Admission:  (Resolved) HTN (hypertension)  Anemia in chronic kidney disease  sepsis secondary to Peritonitis associated with peritoneal dialysis [facility])   Physical Exam Vitals and nursing note reviewed.  Constitutional:      General: He is not in acute distress.    Appearance: He is ill-appearing. He is not toxic-appearing.  HENT:     Head: Normocephalic and atraumatic.  Cardiovascular:     Rate and Rhythm: Normal rate.  Pulmonary:     Effort: Pulmonary effort is normal. No respiratory distress.  Abdominal:     General: Abdomen is flat. There is no distension.  Skin:    General: Skin is warm and dry.  Neurological:     General: No focal deficit present.     Mental Status: He is alert and oriented to person, place, and time.  Psychiatric:        Mood and Affect: Mood normal.        Behavior: Behavior normal.     Vital Signs:  BP 107/81   Pulse (!) 104   Temp 97.7 F (36.5 C) (Axillary)   Resp 18   Ht 5\' 8"  (1.727 m)   Wt 67.2 kg   SpO2  100%   BMI 22.53 kg/m   Palliative Assessment/Data: 30-40%    Advanced Care Planning:   Existing Vynca/ACP Documentation: None  Primary Decision Maker: PATIENT  Code Status/Advance Care Planning: DNR-comfort  A discussion was had today regarding advanced directives. Concepts specific to code status, artifical feeding and hydration, continued IV antibiotics and rehospitalization was had.  The difference between a aggressive medical intervention path and a palliative comfort care path for this patient at this time was had.   Decisions/Changes to ACP: None today  Assessment & Plan:   Impression: 89 year old male with acute presentation chronic comorbidities as described above.  The patient is expressing that he is tired of ongoing dialysis and he is wishing for comfort care and hospice care to approach end-of-life comfortably, with peace, and with dignity.  At this point we will transition to comfort care and request evaluation for inpatient/residential hospice in [place].  Overall prognosis grave.  SUMMARY OF RECOMMENDATIONS   DNR-Comfort Transition to comfort care See symptom management orders below TOC referral for hospice referral to [facility] Palliative medicine will continue to follow while inpatient  Symptom Management:  Tylenol 650 mg PR every 6 hours as needed mild pain or fever Benadryl 12.5 mg IV every 4 hours as needed itching Biotene solution 15 mL topical as needed for dry mouth Robinul 0.2 mg IV every 4 hours as needed excessive secretions Haldol 0.5 mg IV every 4 hours as needed agitation or delirium Hydromorphone 0.5 mg IV every 2 hours as needed severe pain Ativan 1 mg IV every 4 hours as needed anxiety Zofran 4 mg IV every 6 hours as needed nausea Promethazine 12.5 mg IV every 6 hours as needed refractory nausea/vomiting Polyvinyl alcohol 1.4% ophthalmic 1 drop OU 4 times daily dry eyes  Prognosis:  < 2 weeks  Discharge Planning:  Hospice  facility   Discussed with: Patient, family, medical team, nursing team, [facility] team, hospice liaison    Thank you for allowing [place] to participate in the care of SUHAYB ANZALONE PMT will continue to support holistically.  Time Total: 77 min  Detailed review of medical records (labs, imaging, vital signs), medically appropriate exam, discussed with treatment team, counseling and education to patient, family, & staff, documenting clinical information, medication management, coordination of care  Signed by: Wynne Dust, NP Palliative Medicine Team  Team Phone # [phone] (Nights/Weekends)  12/26/2023, 9:40 AM

## 2023-12-26 NOTE — Care Management Important Message (Signed)
Important Message  Patient Details  Name: Shaun Murillo MRN: [id number] Date of Birth: 20-Apr-1932   Important Message Given:  Yes - Medicare IM     [name] 12/26/2023, 3:26 PM

## 2023-12-26 NOTE — Progress Notes (Signed)
Pt has refused the PD tx, he states no more tx he is ready to die.  MD on call notified.

## 2023-12-26 NOTE — Progress Notes (Signed)
Progress Note   Patient: Shaun Murillo:[id number] DOB: 12-26-1931 DOA: 12/20/2023     6 DOS: the patient was seen and examined on 12/26/2023   Brief hospital course: 89yo with h/o ESRD on PD, afib on Eliquis, cirrhosis, and R parotid mass s/p remote resection who presented on 2/7 with a fall and abdominal pain. Evaluation showed sepsis due to peritonitis as well as hypotension and afib with RVR.  He was transferred to the ICU due to persistent hypotension despite fluid resuscitation and required pressors.  Cardiology and nephrology have also consulted.  He had recurrent hypotension overnight during PD on 2/11 and was given albumin.  Assessment and Plan:  Septic shock with lactic acidosis secondary to SBP with chronic peritoneal catheter from Klebsiella Pneumoniae Patient presented with infection + hypotension Required pressors, now off Treated with ceftazidime per nephrology Holding Coreg, Lasix Patient has decided to stop treatments and transition to comfort care   ESRD Undergoes Peritoneal Dialysis Continues on peritoneal dialysis per nephrology He does not want to go on regular hemodialysis Nephrology is consulting   Removed temporary HD catheter He reports persistent early satiety that does not appear to have improved while infection is improving Refused PD on 2/12-13 overnight He has decided to stop PD and transition to comfort   Hypotension Due to sepsis on presentation Baseline pressure has improved, no longer needing pressors However, he continued to drop BPs overnight on 2/10-11 He improved some with albumin    Dysphagia Patient reports early satiety and a globus sensation SLP consulted This is a long-standing problem and patient/family do not wish to address it at this time Dietary recommendations as per SLP He is unlikely to be able to get stronger so long as he continues to be unable to tolerate PO, so if he does not wish to consider esophageal dilation then comfort  care is likely most reasonable   Atrial fibrillation RVR, known history of A-fib on chronic Eliquis Discontinue Eliquis due to transition to comfort Discontinued amiodarone    DNR/GOC DNR confirmed on admission Palliative care consulted - he had an appointment to meet with [facility] palliative at home the week of admission If he unable to tolerate ongoing PD, he decided that he will transition to comfort This is further complicated by his inability to tolerate PO with nausea anytime he gets out of bed as well his inability to do PD in a SNF - meaning that his rehab potential is bleak Based on family discussions, patient decision, and palliative care support, he has transitioned to comfort care only and is stopping dialysis He will qualify for residential hospice when a bed is available; family prefers [facility] in [place] if possible but would also consider [facility]           Consultants: [facility] Nephrology Cardiology Palliative care PT OT SLP Nutrition TOC team   Procedures: Peritoneal dialysis CVC insertion 2/7 ABI 2/8   Antibiotics: Cefepime x 1 Vancomycin x 1 Zosyn 2/8-9 Ceftazidime 2/9-13  30 Day Unplanned Readmission Risk Score    Flowsheet Row ED to Hosp-Admission (Current) from 12/20/2023 in [place] [facility] Medical ICU  30 Day Unplanned Readmission Risk Score (%) 22.52 Filed at 12/26/2023 0400       This score is the patient's risk of an unplanned readmission within 30 days of being discharged (0 -100%). The score is based on dignosis, age, lab data, medications, orders, and past utilization.   Low:  0-14.9  Medium: 15-21.9   High: 22-29.9   Extreme: 30 and above           Subjective: Feels very weak/tired today.   Objective: Vitals:   12/26/23 0500 12/26/23 0726  BP: 107/81   Pulse: (!) 104   Resp: 18   Temp:  97.7 F (36.5 C)  SpO2: 100%     Intake/Output Summary (Last 24 hours) at 12/26/2023 1344 Last data  filed at 12/25/2023 2200 Gross per 24 hour  Intake 230 ml  Output 0 ml  Net 230 ml   Filed Weights   12/23/23 1040 12/24/23 0930 12/26/23 0500  Weight: (S) 68.6 kg 63.5 kg 67.2 kg    Exam:  General:  Appears calm but uncomfortable due to nausea, very fatigued Eyes:   EOMI, normal lids, iris ENT:  hard of hearing with hearing aids in place, grossly normal lips & tongue, mmm Neck:  no LAD, masses or thyromegaly Cardiovascular:  RRR, no m/r/g. No LE edema.  Respiratory:   CTA bilaterally with no wheezes/rales/rhonchi.  Normal respiratory effort. Abdomen:  soft, NT, ND; + L inguinal hernia Skin:  no rash or induration seen on limited exam Musculoskeletal:  grossly normal tone BUE/BLE, good ROM, no bony abnormality Psychiatric:  blunted mood and affect, speech fluent and appropriate, AOx3 Neurologic:  CN 2-12 grossly intact, moves all extremities in coordinated fashion  Data Reviewed: I have reviewed the patient's lab results since admission.  Pertinent labs for today include:   Na++ 125 BUN 43/Creatinine 7.54/GFR 6 WBC 10.7 Hgb 10.3     Family Communication: Multiple family members were present  Disposition: Status is: Inpatient Remains inpatient appropriate because: transition to comfort care, awaiting residential hospice bed availability     Time spent: 50 minutes  Unresulted Labs (From admission, onward)    None        Author: Jonah Blue, MD 12/26/2023 1:44 PM  For on call review [email or web address].

## 2023-12-26 NOTE — Plan of Care (Signed)
Patient alert/oriented X4. Patient skin assessed with Fayrene Fearing, RN on admission, skin tear noted on buttocks. Patient ambulated to and from bathroom using a walker. Family at bedside, VSS, no complaints at this time.   Problem: Education: Goal: Knowledge of General Education information will improve Description: Including pain rating scale, medication(s)/side effects and non-pharmacologic comfort measures Outcome: Progressing   Problem: Health Behavior/Discharge Planning: Goal: Ability to manage health-related needs will improve Outcome: Progressing   Problem: Clinical Measurements: Goal: Ability to maintain clinical measurements within normal limits will improve Outcome: Progressing   Problem: Clinical Measurements: Goal: Will remain free from infection Outcome: Progressing   Problem: Clinical Measurements: Goal: Diagnostic test results will improve Outcome: Progressing   Problem: Clinical Measurements: Goal: Respiratory complications will improve Outcome: Progressing   Problem: Clinical Measurements: Goal: Cardiovascular complication will be avoided Outcome: Progressing   Problem: Activity: Goal: Risk for activity intolerance will decrease Outcome: Progressing   Problem: Nutrition: Goal: Adequate nutrition will be maintained Outcome: Progressing   Problem: Coping: Goal: Level of anxiety will decrease Outcome: Progressing   Problem: Pain Managment: Goal: General experience of comfort will improve and/or be controlled Outcome: Progressing   Problem: Safety: Goal: Ability to remain free from injury will improve Outcome: Progressing   Problem: Skin Integrity: Goal: Risk for impaired skin integrity will decrease Outcome: Progressing   Problem: Education: Goal: Knowledge of the prescribed therapeutic regimen will improve Outcome: Progressing   Problem: Coping: Goal: Ability to identify and develop effective coping behavior will improve Outcome: Progressing    Problem: Clinical Measurements: Goal: Quality of life will improve Outcome: Progressing   Problem: Respiratory: Goal: Verbalizations of increased ease of respirations will increase Outcome: Progressing   Problem: Role Relationship: Goal: Family's ability to cope with current situation will improve Outcome: Progressing   Problem: Role Relationship: Goal: Ability to verbalize concerns, feelings, and thoughts to partner or family member will improve Outcome: Progressing   Problem: Pain Management: Goal: Satisfaction with pain management regimen will improve Outcome: Progressing

## 2023-12-26 NOTE — Progress Notes (Signed)
Pt has decided to stop peritoneal dialysis and will transition to hospice/ comfort care. No further suggestions. Will sign off.   Vinson Moselle  MD  [facility] 12/26/2023, 11:18 AM

## 2023-12-27 DIAGNOSIS — T8571XD Infection and inflammatory reaction due to peritoneal dialysis catheter, subsequent encounter: Secondary | ICD-10-CM | POA: Diagnosis not present

## 2023-12-27 DIAGNOSIS — Z515 Encounter for palliative care: Secondary | ICD-10-CM | POA: Diagnosis not present

## 2023-12-27 DIAGNOSIS — T8571XA Infection and inflammatory reaction due to peritoneal dialysis catheter, initial encounter: Secondary | ICD-10-CM | POA: Diagnosis not present

## 2023-12-27 DIAGNOSIS — R627 Adult failure to thrive: Secondary | ICD-10-CM

## 2023-12-27 DIAGNOSIS — A419 Sepsis, unspecified organism: Secondary | ICD-10-CM | POA: Diagnosis not present

## 2023-12-27 DIAGNOSIS — Z7189 Other specified counseling: Secondary | ICD-10-CM | POA: Diagnosis not present

## 2023-12-27 MED ORDER — PHENOL 1.4 % MT LIQD
1.0000 | OROMUCOSAL | Status: DC | PRN
  Filled 2023-12-27: qty 177

## 2023-12-27 NOTE — Progress Notes (Signed)
Pt alert, RM with V/S stable is being D/C to [facility] [place], Sharin Mons is here to transfer pt. IV is being remove and D/C paper work give to [employer], son is with pt on bedside.

## 2023-12-27 NOTE — Discharge Summary (Signed)
Physician Discharge Summary   Patient: Shaun Murillo MRN: [id number] DOB: Aug 20, 1932  Admit date:     12/20/2023  Discharge date: 12/27/23  Discharge Physician: Jonah Blue   PCP: Irven Coe, MD   Recommendations at discharge:   You are being discharged to residential hospice Comfort-directed medications will be provided per hospice protocols  Discharge Diagnoses: Principal Problem:   sepsis secondary to Peritonitis associated with peritoneal dialysis [facility]) Active Problems:   Chronic atrial fibrillation with RVR (HCC)   Atherosclerotic ulcer of aorta (HCC)   Abdominal aortic aneurysm (AAA) (HCC)   ESRD on dialysis (HCC)   Anemia in chronic kidney disease   Failure to thrive in adult   Septic shock (HCC)   Atrial fibrillation with RVR (HCC)   Protein-calorie malnutrition, severe    Hospital Course: 89yo with h/o ESRD on PD, afib on Eliquis, cirrhosis, and R parotid mass s/p remote resection who presented on 2/7 with a fall and abdominal pain. Evaluation showed sepsis due to peritonitis as well as hypotension and afib with RVR.  He was transferred to the ICU due to persistent hypotension despite fluid resuscitation and required pressors.  Cardiology and nephrology have also consulted.  He had recurrent hypotension overnight during PD on 2/11 and was given albumin.  Assessment and Plan:  Septic shock with lactic acidosis secondary to SBP with chronic peritoneal catheter from Klebsiella Pneumoniae Patient presented with infection + hypotension Required pressors, now off Treated with ceftazidime per nephrology Holding Coreg, Lasix Patient has decided to stop treatments and transition to comfort care   ESRD Undergoes Peritoneal Dialysis Continues on peritoneal dialysis per nephrology He does not want to go on regular hemodialysis Nephrology is consulting   Removed temporary HD catheter He reports persistent early satiety that does not appear to have improved while infection  is improving Refused PD on 2/12-13 overnight He has decided to stop PD and transition to comfort   Hypotension Due to sepsis on presentation Weaned off pressors Transitioned to comfort care   Dysphagia Patient reports early satiety and a globus sensation SLP consulted This is a long-standing problem and patient/family do not wish to address it at this time He is unlikely to be able to get stronger so long as he continues to be unable to tolerate PO; since he does not wish to consider esophageal dilation, comfort care is most reasonable   Atrial fibrillation RVR, known history of A-fib on chronic Eliquis Discontinue Eliquis due to transition to comfort Discontinued amiodarone     DNR/GOC DNR confirmed on admission Palliative care consulted - he had an appointment to meet with [facility] palliative at home the week of admission Unable to tolerate ongoing PD, he decided that he will transition to comfort Based on family discussions, patient decision, and palliative care support, he has transitioned to comfort care only and is stopping dialysis He qualifies for residential hospice when a bed is available; [facility] and can dc to there today           Consultants: PCCM Nephrology Cardiology Palliative care PT OT SLP Nutrition TOC team   Procedures: Peritoneal dialysis CVC insertion 2/7 ABI 2/8   Antibiotics: Cefepime x 1 Vancomycin x 1 Zosyn 2/8-9 Ceftazidime 2/9-13    Disposition: Hospice care Diet recommendation:  Regular diet DISCHARGE MEDICATION: Allergies as of 12/27/2023       Reactions   Codeine Nausea And Vomiting   Penicillins Nausea And Vomiting  Has patient had a PCN reaction causing immediate rash, facial/tongue/throat swelling, SOB or lightheadedness with hypotension: no Has patient had a PCN reaction causing severe rash involving mucus membranes or skin necrosis:no Has patient had a PCN  reaction that required hospitalization no Has patient had a PCN reaction occurring within the last 10 years: no If all of the above answers are "NO", then may proceed with Cephalosporin use.        Medication List     STOP taking these medications    allopurinol 100 MG tablet Commonly known as: ZYLOPRIM   apixaban 2.5 MG Tabs tablet Commonly known as: ELIQUIS   Auryxia 1 GM 210 MG(Fe) tablet Generic drug: ferric citrate   calcitRIOL 0.25 MCG capsule Commonly known as: ROCALTROL   carvedilol 3.125 MG tablet Commonly known as: COREG   DIALYVITE TABLET Tabs   furosemide 40 MG tablet Commonly known as: LASIX   omeprazole 40 MG capsule Commonly known as: PRILOSEC   Potassium Chloride ER 20 MEQ Tbcr        Discharge Exam:   Subjective: He was uncomfortable this AM, not taking pain medication.  He was given Dilaudid with some improvement.   Objective: Vitals:   12/26/23 2050 12/27/23 0754  BP: 106/72 107/76  Pulse: (!) 102 94  Resp: 18 18  Temp:  98 F (36.7 C)  SpO2: 100% 100%    Intake/Output Summary (Last 24 hours) at 12/27/2023 1553 Last data filed at 12/26/2023 1918 Gross per 24 hour  Intake 100 ml  Output --  Net 100 ml   Filed Weights   12/23/23 1040 12/24/23 0930 12/26/23 0500  Weight: (S) 68.6 kg 63.5 kg 67.2 kg    Exam:  General:  Appears calm but uncomfortable  Eyes:   EOMI, normal lids, iris ENT:  hard of hearing with hearing aids in place, dry lips and mucus membranes, mild ulcerations of tongue and gums Neck:  no LAD, masses or thyromegaly Cardiovascular:  RRR, no m/r/g. No LE edema.  Respiratory:   CTA bilaterally with no wheezes/rales/rhonchi.  Normal respiratory effort. Abdomen:  soft, NT, ND; + L inguinal hernia Skin:  no rash or induration seen on limited exam Musculoskeletal:  grossly normal tone BUE/BLE, good ROM, no bony abnormality Psychiatric:  blunted mood and affect, speech sparse but appropriate Neurologic:  grossly  normal  Data Reviewed: I have reviewed the patient's lab results since admission.  Pertinent labs for today include:   None    Condition at discharge:  terminal  The results of significant diagnostics from this hospitalization (including imaging, microbiology, ancillary and laboratory) are listed below for reference.   Imaging Studies: VAS [place] ABI WITH/WO TBI Result Date: 12/22/2023  LOWER EXTREMITY DOPPLER STUDY Patient Name:  Shaun Murillo  Date of Exam:   12/21/2023 Medical Rec #: [id number]      Accession #:    [id number] Date of Birth: [birth year]      Patient Gender: M Patient Age:   59 years Exam Location:  [facility] Procedure:      VAS [place] ABI WITH/WO TBI Referring Phys: JILL MCDANIEL --------------------------------------------------------------------------------  Indications: Ulceration. High Risk Factors: Hypertension. Other Factors: ESRD on peritoneal dialysis, Afib RVR.  Limitations: Today's exam was limited due to Afib with RVR. Comparison Study: No prior study on file Performing Technologist: Sherren Kerns RVS  Examination Guidelines: A complete evaluation includes at minimum, Doppler waveform signals and systolic blood pressure reading at the level of bilateral brachial, anterior tibial,  and posterior tibial arteries, when vessel segments are accessible. Bilateral testing is considered an integral part of a complete examination. Photoelectric Plethysmograph (PPG) waveforms and toe systolic pressure readings are included as required and additional duplex testing as needed. Limited examinations for reoccurring indications may be performed as noted.  ABI Findings: +---------+------------------+-----+----------+----------------+ Right    Rt Pressure (mmHg)IndexWaveform  Comment          +---------+------------------+-----+----------+----------------+ Brachial                                  restricted/lines +---------+------------------+-----+----------+----------------+  PTA      105               1.09 monophasic                 +---------+------------------+-----+----------+----------------+ DP       69                0.72 monophasic                 +---------+------------------+-----+----------+----------------+ Great Toe0                 0.00 Absent                     +---------+------------------+-----+----------+----------------+ +---------+------------------+-----+-----------+-------+ Left     Lt Pressure (mmHg)IndexWaveform   Comment +---------+------------------+-----+-----------+-------+ Brachial 96                     monophasic         +---------+------------------+-----+-----------+-------+ PTA      102               1.06 multiphasic        +---------+------------------+-----+-----------+-------+ DP       109               1.14 multiphasic        +---------+------------------+-----+-----------+-------+ Great Toe0                 0.00 Absent             +---------+------------------+-----+-----------+-------+ +-------+-----------+-----------+------------+------------+ ABI/TBIToday's ABIToday's TBIPrevious ABIPrevious TBI +-------+-----------+-----------+------------+------------+ Right  1.09       absent                              +-------+-----------+-----------+------------+------------+ Left   1.14       absent                              +-------+-----------+-----------+------------+------------+ Patient's waveforms may not be accurate secondary to Afib with RVR and mechanical interfence with Doppler in patient's room.  Summary: Right: Resting right ankle-brachial index is within normal range. The right toe-brachial index is abnormal. Left: Resting left ankle-brachial index is within normal range. The left toe-brachial index is abnormal. *See table(s) above for measurements and observations.  Electronically signed by Gerarda Fraction on 12/22/2023 at 10:27:45 AM.    Final    DG CHEST PORT 1  VIEW Result Date: 12/20/2023 CLINICAL DATA:  Central line placement EXAM: PORTABLE CHEST 1 VIEW COMPARISON:  12/20/2023 FINDINGS: Right internal jugular Vas-Cath tip in the right atrium. No pneumothorax. Mild cardiomegaly, vascular congestion. Bibasilar opacities, likely atelectasis. No overt edema or effusions. No acute bony abnormality. IMPRESSION: Right central line tip in the right atrium.  No  pneumothorax. Borderline cardiomegaly, vascular congestion. Bibasilar atelectasis. Electronically Signed   By: Charlett Nose M.D.   On: 12/20/2023 19:21   CT Angio Abd/Pel w/ and/or w/o Result Date: 12/20/2023 CLINICAL DATA:  Duodenal ulcer, rule out atherosclerotic penetrating ulcer EXAM: CTA ABDOMEN AND PELVIS WITHOUT AND WITH CONTRAST TECHNIQUE: Multidetector CT imaging of the abdomen and pelvis was performed using the standard protocol during bolus administration of intravenous contrast. Multiplanar reconstructed images and MIPs were obtained and reviewed to evaluate the vascular anatomy. RADIATION DOSE REDUCTION: This exam was performed according to the departmental dose-optimization program which includes automated exposure control, adjustment of the mA and/or kV according to patient size and/or use of iterative reconstruction technique. CONTRAST:  75mL OMNIPAQUE IOHEXOL 350 MG/ML SOLN COMPARISON:  CT abdomen pelvis, 12/20/2023 FINDINGS: VASCULAR Unchanged, chronic appearing, calcified infrarenal focal dissection or penetrating ulceration, maximum caliber of the vessel 3.1 x 3.0 cm (series 5, image 99). Additional thrombosed likely chronic penetrating ulceration arising from the most inferior portion of the abdominal aorta, just above the aortic bifurcation, depth of 1.4 cm, overall diameter of the vessel at this level 3.2 x 2.2 cm (series 5, image 126). Remainder of the vessel is normal in caliber. Solitary bilateral renal arteries. Direct origin of the pancreaticoduodenal artery from the celiac trunk, as well as  direct origin of the common hepatic artery from the trunk. 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 Moderate mixed calcific atherosclerosis. Review of the MIP images confirms the above findings. NON-VASCULAR Lower Chest: Small bilateral pleural effusions and associated atelectasis or consolidation. Cardiomegaly and coronary artery calcifications. Hepatobiliary: Coarse, nodular cirrhotic morphology of the liver. No focal liver lesions. No gallstones, gallbladder wall thickening, or biliary dilatation. Pancreas: Unremarkable. No pancreatic ductal dilatation or surrounding inflammatory changes. Spleen: Normal in size without significant abnormality. Adrenals/Urinary Tract: Adrenal glands are unremarkable. Severely atrophic kidneys. Small nonobstructive calculi. No hydronephrosis. Multiple simple, benign bilateral renal cortical cysts, for which no further follow-up or characterization is required. Bladder is unremarkable. Stomach/Bowel: Stomach is within normal limits. Appendix appears normal. No evidence of bowel wall thickening, distention, or inflammatory changes. Descending and sigmoid diverticulosis. Lymphatic: No enlarged abdominal or pelvic lymph nodes. Reproductive: No mass or other significant abnormality. Other: Fat and fluid containing left inguinal hernia. Small volume ascites. Scattered pneumoperitoneum. Tenckhoff type tunneled peritoneal dialysis catheter positioned in the low pelvis. Musculoskeletal: No acute osseous findings. IMPRESSION: 1. Chronic appearing, calcified infrarenal focal dissection or penetrating ulceration, maximum caliber of the vessel 3.1 x 3.0 cm. 2. Additional thrombosed, likely chronic penetrating ulceration arising from the most inferior portion of the abdominal aorta, just above the aortic bifurcation, depth of 1.4 cm, overall diameter of the vessel at this level 3.2 x 2.2 cm. 3. Scattered pneumoperitoneum and  small volume ascites, likely related to peritoneal dialysis. 4. Cirrhosis. 5. Severely atrophic kidneys. Small nonobstructive calculi. No hydronephrosis. 6. Small bilateral pleural effusions and associated atelectasis or consolidation. 7. Cardiomegaly and coronary artery disease. Aortic Atherosclerosis (ICD10-I70.0). Electronically Signed   By: Jearld Lesch M.D.   On: 12/20/2023 14:40   CT ABDOMEN PELVIS WO CONTRAST Result Date: 12/20/2023 CLINICAL DATA:  Sepsis, nausea, vomiting for 1 day EXAM: CT ABDOMEN AND PELVIS WITHOUT CONTRAST TECHNIQUE: Multidetector CT imaging of the abdomen and pelvis was performed following the standard protocol without IV contrast. RADIATION DOSE REDUCTION: This exam was performed according to the departmental dose-optimization program which includes automated exposure control, adjustment of the mA and/or kV according  to patient size and/or use of iterative reconstruction technique. COMPARISON:  None Available. FINDINGS: Lower chest: No acute abnormality. Hepatobiliary: Nodular hepatic contour compatible with cirrhosis. Cholelithiasis. No evidence of acute cholecystitis. No biliary dilation. Pancreas: Unremarkable. Spleen: Unremarkable. Adrenals/Urinary Tract: Normal adrenal glands. Atrophic bilateral native kidneys. Bilateral renal cystic lesions with low to intermediate density. For example a 2.9 cm cystic lesion (Hounsfield units 36) on the left and 3.3 cm cystic lesion (Hounsfield units 25). These likely represent benign cysts however the density is greater than simple cysts. Consider nonemergent renal ultrasound for further evaluation. No urinary calculi or hydronephrosis. Nondistended bladder. Stomach/Bowel: Stomach is within normal limits. No bowel obstruction. Question mild wall thickening about the small bowel in the left upper quadrant as well as the ascending and descending colon. Extensive sigmoid diverticulosis adjacent fluid and stranding is favored due to ascites.  Normal appendix. Vascular/Lymphatic: Aortic atherosclerotic calcification. 3.1 cm infrarenal abdominal aortic aneurysm. Additional focal outpouching along the anterior aortic wall at the bifurcation. The aorta measures 3.3 cm in maximum diameter at this location. The focal outpouching measures 1.6 x 1.3 cm. This is incompletely evaluated without IV contrast but favored to represent a penetrating atherosclerotic ulcer. Reproductive: Unremarkable. Other: Small volume intraperitoneal fluid in the abdomen and pelvis. Additional free air in the anterior upper abdomen is presumed due to peritoneal dialysis. Peritoneal dialysis catheter tip in the pelvis. Ascitic fluid and fat within a left inguinal hernia. Musculoskeletal: No acute fracture. IMPRESSION: 1. Question mild wall thickening about the small bowel in the left upper quadrant as well as the ascending and descending colon. Differential considerations include enterocolitis or congestive enteropathy/colopathy. 2. Extensive sigmoid diverticulosis. Adjacent fluid and stranding is favored due to ascites. 3. Cirrhosis with small volume ascites. 4. Small volume free air in the upper abdomen presumed due to peritoneal dialysis. 5. 3.1 cm infrarenal abdominal aortic aneurysm. Recommend follow-up ultrasound every 3 years. (Ref.: J Vasc Surg. 2018; 67:2-77 and J Am Coll Radiol 2013;10(10):789-794.) 6. Additional focal outpouching along the anterior aortic wall at the bifurcation which may represent a penetrating atherosclerotic ulcer. This measures 3.1 cm in maximum diameter with the outpouching measuring 1.6 cm in width and 1.3 cm in depth. This is incompletely evaluated without IV contrast. Consider CTA of the abdomen and pelvis for further evaluation. 7. Bilateral renal cystic lesions with low to intermediate density. These likely represent benign cysts however the density is greater than simple cysts. Consider nonemergent renal ultrasound for further evaluation. 8.   Aortic Atherosclerosis (ICD10-I70.0). Electronically Signed   By: Minerva Fester M.D.   On: 12/20/2023 08:46   DG Chest Port 1 View Result Date: 12/20/2023 CLINICAL DATA:  Weakness, generalized abdominal and chest pain EXAM: PORTABLE CHEST 1 VIEW COMPARISON:  01/18/2023 FINDINGS: Cardiomegaly. Low lung volumes accentuate pulmonary vascularity. Bibasilar atelectasis. No pleural effusion or pneumothorax. No displaced rib fractures. IMPRESSION: Low lung volumes with bibasilar atelectasis. Electronically Signed   By: Minerva Fester M.D.   On: 12/20/2023 08:27    Microbiology: Results for orders placed or performed during the hospital encounter of 12/20/23  Resp panel by RT-PCR (RSV, Flu A&B, Covid) Anterior Nasal Swab     Status: None   Collection Time: 12/20/23  7:39 AM   Specimen: Anterior Nasal Swab  Result Value Ref Range Status   SARS Coronavirus 2 by RT PCR NEGATIVE NEGATIVE Final   Influenza A by PCR NEGATIVE NEGATIVE Final   Influenza B by PCR NEGATIVE NEGATIVE Final    Comment: (NOTE)  The Xpert Xpress SARS-CoV-2/FLU/RSV plus assay is intended as an aid in the diagnosis of influenza from Nasopharyngeal swab specimens and should not be used as a sole basis for treatment. Nasal washings and aspirates are unacceptable for Xpert Xpress SARS-CoV-2/FLU/RSV testing.  Fact Sheet for Patients: BloggerCourse.com  Fact Sheet for Healthcare Providers: SeriousBroker.it  This test is not yet approved or cleared by the [place] FDA and has been authorized for detection and/or diagnosis of SARS-CoV-2 by FDA under an Emergency Use Authorization (EUA). This EUA will remain in effect (meaning this test can be used) for the duration of the COVID-19 declaration under Section 564(b)(1) of the Act, 21 U.S.C. section 360bbb-3(b)(1), unless the authorization is terminated or revoked.     Resp Syncytial Virus by PCR NEGATIVE NEGATIVE Final     Comment: (NOTE) Fact Sheet for Patients: BloggerCourse.com  Fact Sheet for Healthcare Providers: SeriousBroker.it  This test is not yet approved or cleared by the [place] FDA and has been authorized for detection and/or diagnosis of SARS-CoV-2 by FDA under an Emergency Use Authorization (EUA). This EUA will remain in effect (meaning this test can be used) for the duration of the COVID-19 declaration under Section 564(b)(1) of the Act, 21 U.S.C. section 360bbb-3(b)(1), unless the authorization is terminated or revoked.  Performed at [facility], [address] [place], [place] [address]   Body fluid culture w Gram Stain     Status: None   Collection Time: 12/20/23  8:19 AM   Specimen: Peritoneal Washings; Body Fluid  Result Value Ref Range Status   Specimen Description PERITONEAL  Final   Special Requests Immunocompromised  Final   Gram Stain   Final    WBC PRESENT, PREDOMINANTLY PMN GRAM NEGATIVE RODS CRITICAL RESULT CALLED TO, READ BACK BY AND VERIFIED WITH: RN CHANDRA BROWN ON 12/20/23 @ 1501 BY DRT Performed at [facility], [address], [place], [place] [address]    Culture ABUNDANT KLEBSIELLA PNEUMONIAE  Final   Report Status 12/22/2023 FINAL  Final   Organism ID, Bacteria KLEBSIELLA PNEUMONIAE  Final      Susceptibility   Klebsiella pneumoniae - MIC*    AMPICILLIN RESISTANT Resistant     CEFEPIME <=0.12 SENSITIVE Sensitive     CEFTAZIDIME <=1 SENSITIVE Sensitive     CEFTRIAXONE <=0.25 SENSITIVE Sensitive     CIPROFLOXACIN <=0.25 SENSITIVE Sensitive     GENTAMICIN <=1 SENSITIVE Sensitive     IMIPENEM <=0.25 SENSITIVE Sensitive     TRIMETH/SULFA <=20 SENSITIVE Sensitive     AMPICILLIN/SULBACTAM <=2 SENSITIVE Sensitive     PIP/TAZO <=4 SENSITIVE Sensitive ug/mL    * ABUNDANT KLEBSIELLA PNEUMONIAE  Blood Culture (routine x 2)     Status: None   Collection Time: 12/20/23  8:36 AM   Specimen: BLOOD   Result Value Ref Range Status   Specimen Description BLOOD SITE NOT SPECIFIED  Final   Special Requests   Final    BOTTLES DRAWN AEROBIC AND ANAEROBIC Blood Culture results may not be optimal due to an inadequate volume of blood received in culture bottles   Culture   Final    NO GROWTH 5 DAYS Performed at [facility], [address], [place], [place] [address]    Report Status 12/25/2023 FINAL  Final  Blood Culture (routine x 2)     Status: None   Collection Time: 12/20/23 10:10 AM   Specimen: BLOOD  Result Value Ref Range Status   Specimen Description BLOOD  BLOOD RIGHT WRIST  Final   Special Requests   Final    BOTTLES DRAWN AEROBIC AND ANAEROBIC Blood Culture results may not be optimal due to an inadequate volume of blood received in culture bottles   Culture   Final    NO GROWTH 5 DAYS Performed at [facility], [address] [place], [place] [address]    Report Status 12/25/2023 FINAL  Final  MRSA Next Gen by PCR, Nasal     Status: None   Collection Time: 12/20/23  6:03 PM   Specimen: Nasal Mucosa; Nasal Swab  Result Value Ref Range Status   MRSA by PCR Next Gen NOT DETECTED NOT DETECTED Final    Comment: (NOTE) The GeneXpert MRSA Assay (FDA approved for NASAL specimens only), is one component of a comprehensive MRSA colonization surveillance program. It is not intended to diagnose MRSA infection nor to guide or monitor treatment for MRSA infections. Test performance is not FDA approved in patients less than 54 years old. Performed at [facility], [address] [place], [place] [address]     Labs: CBC: Recent Labs  Lab 12/22/23 0825 12/23/23 0444 12/24/23 0415 12/25/23 0429 12/26/23 0243  WBC 12.9* 10.5 8.0 8.0 10.7*  NEUTROABS  --   --   --   --  9.2*  HGB 10.3* 11.0* 9.4* 9.7* 10.3*  HCT 30.7* 32.9* 27.6* 28.2* 29.8*  MCV 95.6 95.9 95.2 94.9 93.4  PLT 226 207 184 186 202   Basic Metabolic Panel: Recent Labs  Lab 12/21/23 0301  12/22/23 0825 12/23/23 0242 12/24/23 0415 12/25/23 0429 12/26/23 0243  NA 129* 125* 123* 128* 126* 125*  K 4.6 4.2 4.7 3.8 3.8 4.7  CL 95* 90* 89* 90* 89* 90*  CO2 19* 20* 16* 21* 23 22  GLUCOSE 79 107* 137* 140* 132* 75  BUN 44* 42* 45* 42* 38* 43*  CREATININE 7.45* 6.84* 6.77* 6.47* 6.35* 7.54*  CALCIUM 8.0* 8.2* 8.4* 8.4* 8.4* 8.4*  MG 1.2* 1.7 2.3 2.1 2.0  --   PHOS  --   --   --  4.3  --   --    Liver Function Tests: Recent Labs  Lab 12/21/23 0301 12/22/23 0825 12/23/23 0242 12/24/23 0415 12/25/23 0429  AST 21 17 34 21 21  ALT 19 18 19 20 21   ALKPHOS 47 54 56 56 54  BILITOT 0.9 0.4 0.8 0.3 0.7  PROT 4.4* 4.7* 4.5* 4.2* 4.7*  ALBUMIN 1.6* 1.7* 1.6* 1.6* 2.2*   CBG: Recent Labs  Lab 12/20/23 1804 12/20/23 1941  GLUCAP 60* 81    Discharge time spent: greater than 30 minutes.  Signed: Jonah Blue, MD [facility] 12/27/2023

## 2023-12-27 NOTE — Progress Notes (Signed)
[facility] Room 5C12  [facility] Liaison Note  Referral received from [facility] for family interest in Hospice Home.   Met with patient and family in room. Patient experiencing more pain today requiring IV pain medication.  Hospice Home is able to accept patient this afternoon once consents are complete.   RN staff, you may call report at any time to [facility] @ [phone], room is assigned when report is called.   Please leave IV intact and send completed DNR with patient.   Updated attending and [facility] manager via [employer].  Thank you for the opportunity to participate in this patient's care   Roe Rutherford, BSN, RN Hospice Nurse Liaison [phone]

## 2023-12-27 NOTE — Progress Notes (Signed)
Contacted by pt's [facility] home therapy staff this morning. Staff requesting confirmation that pt has transitioned to comfort care. [facility] home therapy staff advised pt has been made comfort care and will d/c to hospice home later today.   Olivia Canter Renal Navigator [phone]

## 2023-12-27 NOTE — Plan of Care (Signed)

## 2023-12-27 NOTE — TOC Transition Note (Signed)
Transition of Care [facility]) - Discharge Note   Patient Details  Name: Shaun Murillo MRN: [id number] Date of Birth: August 02, 1932  [facility]) CM/SW Contact:  Marliss Coots, LCSW Phone Number: 12/27/2023, 4:23 PM   Clinical Narrative:     Patient will DC to: [facility] Anticipated DC date: 12/27/2023 Family notified: Averie Meiner; Son; [phone] Transport by: Sharin Mons   Per MD patient ready for DC to [facility]. RN to call report prior to discharge [phone]). RN, patient, patient's family, and facility notified of DC. DC packet on chart. Ambulance transport requested for patient.   CSW will sign off for now as social work intervention is no longer needed. Please consult [place] again if new needs arise.   Final next level of care: Hospice Medical Facility Barriers to Discharge: Barriers Resolved   Patient Goals and CMS Choice Patient states their goals for this hospitalization and ongoing recovery are:: IP Hospice          Discharge Placement              Patient chooses bed at: Other - please specify in the comment section below: [facility]) Patient to be transferred to facility by: [facility] Name of family member notified: Dekari Bures; Son; [phone] Patient and family notified of of transfer: 12/27/23  Discharge Plan and Services Additional resources added to the After Visit Summary for   In-house Referral: Clinical Social Work                                   Social Drivers of Health (SDOH) Interventions SDOH Screenings   Food Insecurity: No Food Insecurity (12/21/2023)  Housing: Low Risk  (12/21/2023)  Transportation Needs: No Transportation Needs (12/21/2023)  Utilities: Not At Risk (12/21/2023)  Social Connections: Moderately Integrated (12/21/2023)  Tobacco Use: Medium Risk (03/04/2023)   Received from [facility], [facility]     Readmission Risk Interventions    12/23/2023    3:32  PM  Readmission Risk Prevention Plan  Transportation Screening Complete  PCP or Specialist Appt within 5-7 Days Complete  Home Care Screening Complete  Medication Review (RN CM) Referral to Pharmacy

## 2023-12-27 NOTE — TOC Progression Note (Addendum)
Transition of Care [facility]) - Progression Note    Patient Details  Name: Shaun Murillo MRN: [id number] Date of Birth: 1931-12-04  Transition of [facility]) CM/SW Contact  Marliss Coots, LCSW Phone Number: 12/27/2023, 11:43 AM  Clinical Narrative:     11:43 AM Per [facility], [facility] does not have bed availability for patient discharge date today but continue to follow. Medical team is aware of discharge barrier.  3:20 PM Per [facility], [facility] has bed availability for patient discharge today via ambulance transportation.   Expected Discharge Plan: Hospice Medical Facility Barriers to Discharge: Hospice Bed not available  Expected Discharge Plan and Services In-house Referral: Clinical Social Work     Living arrangements for the past 2 months: Single Family Home                                       Social Determinants of Health (SDOH) Interventions SDOH Screenings   Food Insecurity: No Food Insecurity (12/21/2023)  Housing: Low Risk  (12/21/2023)  Transportation Needs: No Transportation Needs (12/21/2023)  Utilities: Not At Risk (12/21/2023)  Social Connections: Moderately Integrated (12/21/2023)  Tobacco Use: Medium Risk (03/04/2023)   Received from [facility], [facility]    Readmission Risk Interventions    12/23/2023    3:32 PM  Readmission Risk Prevention Plan  Transportation Screening Complete  PCP or Specialist Appt within 5-7 Days Complete  Home Care Screening Complete  Medication Review (RN CM) Referral to Pharmacy

## 2023-12-27 NOTE — Progress Notes (Signed)
Daily Progress Note   Patient Name: Shaun Murillo       Date: 12/27/2023 DOB: Sep 14, 1932  Age: 89 y.o. MRN#: [id number] Attending Physician: Jonah Blue, MD Primary Care Physician: Irven Coe, MD Admit Date: 12/20/2023 Length of Stay: 7 days  Reason for Consultation/Follow-up: Establishing goals of care and Terminal Care  HPI/Patient Profile:  89 y.o. male  with past medical history of ESRD on PD, afib on Eliquis, cirrhosis, and R parotid mass s/p remote resection who presented on 2/7 with a fall and abdominal pain. Evaluation showed sepsis due to peritonitis as well as hypotension and afib with RVR. He was transferred to the ICU due to persistent hypotension despite fluid resuscitation and required pressors. He was admitted on 12/20/2023 with septic shock with lactic acidosis SBP concomitant (yellowing of the dysphagia, and others.    Palliative medicine was consulted for GOC conversations.  Subjective:   Subjective: Chart Reviewed. Updates received. Patient Assessed. Created space and opportunity for patient  and family to explore thoughts and feelings regarding current medical situation.  Today's Discussion: Today saw the patient at bedside, his good friend was present.  He is hard of hearing and made a concerted effort to ensure that he could hear me.  He states that his hernia was hurting but he gave medicine that helped.  He did have some nausea previously, but not now.  I encouraged him to ask for nausea medicine if he has nausea.  I told him if he has any pain, nausea, anxiety, any other symptoms please reach out to the nurse and ask for assistance and they can provide appropriate medications.  I shared that we are waiting for bed placement in residential hospice.  In the meantime he would remain here where he can be comfortable and safe.  He expressed appreciation.  After this we spent some time in general pleasant conversation, sharing stories and experiences from his life.  I  provided emotional and general support through therapeutic listening, empathy, sharing of stories, and other techniques. I answered all questions and addressed all concerns to the best of my ability.  Review of Systems  Respiratory:  Negative for shortness of breath.   Cardiovascular:  Negative for chest pain.  Gastrointestinal:  Positive for abdominal pain (Nurse bringing pain medicine). Negative for nausea and vomiting.    Objective:   Vital Signs:  BP 107/76 (BP Location: Right Arm)   Pulse 94   Temp 98 F (36.7 C) (Oral)   Resp 18   Ht 5\' 8"  (1.727 m)   Wt 67.2 kg   SpO2 100%   BMI 22.53 kg/m   Physical Exam Vitals and nursing note reviewed.  Constitutional:      General: He is not in acute distress.    Appearance: He is ill-appearing.  HENT:     Head: Normocephalic and atraumatic.     Ears:     Comments: Hard of hearing Pulmonary:     Effort: Pulmonary effort is normal. No respiratory distress.  Abdominal:     General: Abdomen is flat.     Palpations: Abdomen is soft.     Tenderness: There is abdominal tenderness.  Skin:    General: Skin is warm and dry.  Neurological:     Mental Status: He is alert.     Palliative Assessment/Data: 50%    Existing Vynca/ACP Documentation: None  Assessment & Plan:   Impression: Present on Admission:  (Resolved) HTN (hypertension)  Anemia in chronic kidney disease  sepsis secondary to Peritonitis associated with peritoneal dialysis [facility])  89 year old male with acute presentation chronic comorbidities as described above.  The patient is expressing that he is tired of ongoing dialysis and he is wishing for comfort care and hospice care to approach end-of-life comfortably, with peace, and with dignity.  At this point we will transition to comfort care and request evaluation for inpatient/residential hospice in [place].  Overall prognosis grave.  SUMMARY OF RECOMMENDATIONS   DNR-Comfort Continued comfort care See  symptom management orders below Patient accepted to [facility] awaiting bed availability Palliative medicine will continue to follow while inpatient  Symptom Management:  Tylenol 650 mg PR every 6 hours as needed mild pain or fever Benadryl 12.5 mg IV every 4 hours as needed itching Biotene solution 15 mL topical as needed for dry mouth Robinul 0.2 mg IV every 4 hours as needed excessive secretions Haldol 0.5 mg IV every 4 hours as needed agitation or delirium Hydromorphone 0.5 mg IV every 2 hours as needed severe pain Ativan 1 mg IV every 4 hours as needed anxiety Zofran 4 mg IV every 6 hours as needed nausea Promethazine 12.5 mg IV every 6 hours as needed refractory nausea/vomiting Polyvinyl alcohol 1.4% ophthalmic 1 drop OU 4 times daily dry eyes  Code Status: DNR-comfort  Prognosis: Hours - Days  Discharge Planning: Hospice facility  Discussed with: Patient, family, medical team, nursing  Thank you for allowing [place] to participate in the care of CORDARO MUKAI PMT will continue to support holistically.  Time Total: 43 min  Detailed review of medical records (labs, imaging, vital signs), medically appropriate exam, discussed with treatment team, counseling and education to patient, family, & staff, documenting clinical information, medication management, coordination of care  Wynne Dust, NP Palliative Medicine Team  Team Phone # [phone] (Nights/Weekends)  07/11/2021, 8:17 AM

## 2024-01-11 DEATH — deceased

## 2024-01-16 ENCOUNTER — Encounter (HOSPITAL_COMMUNITY): Payer: Self-pay

## 2024-01-28 ENCOUNTER — Ambulatory Visit: Payer: Self-pay | Admitting: Internal Medicine
